# Patient Record
Sex: Female | Born: 1935 | Race: Black or African American | Hispanic: No | Smoking: Former smoker
Health system: Southern US, Community
[De-identification: ages and names within clinical notes are randomized; demographics above are authoritative.]

## PROBLEM LIST (undated history)

## (undated) DIAGNOSIS — J9600 Acute respiratory failure, unspecified whether with hypoxia or hypercapnia: Secondary | ICD-10-CM

## (undated) DIAGNOSIS — D3500 Benign neoplasm of unspecified adrenal gland: Secondary | ICD-10-CM

## (undated) DIAGNOSIS — I10 Essential (primary) hypertension: Secondary | ICD-10-CM

## (undated) DIAGNOSIS — C801 Malignant (primary) neoplasm, unspecified: Secondary | ICD-10-CM

## (undated) DIAGNOSIS — N289 Disorder of kidney and ureter, unspecified: Secondary | ICD-10-CM

## (undated) DIAGNOSIS — R7301 Impaired fasting glucose: Secondary | ICD-10-CM

## (undated) DIAGNOSIS — Z720 Tobacco use: Secondary | ICD-10-CM

## (undated) DIAGNOSIS — J449 Chronic obstructive pulmonary disease, unspecified: Secondary | ICD-10-CM

## (undated) DIAGNOSIS — M1990 Unspecified osteoarthritis, unspecified site: Secondary | ICD-10-CM

## (undated) DIAGNOSIS — J189 Pneumonia, unspecified organism: Secondary | ICD-10-CM

## (undated) DIAGNOSIS — J90 Pleural effusion, not elsewhere classified: Secondary | ICD-10-CM

## (undated) DIAGNOSIS — R9431 Abnormal electrocardiogram [ECG] [EKG]: Secondary | ICD-10-CM

## (undated) DIAGNOSIS — G4700 Insomnia, unspecified: Secondary | ICD-10-CM

## (undated) DIAGNOSIS — I2510 Atherosclerotic heart disease of native coronary artery without angina pectoris: Secondary | ICD-10-CM

## (undated) DIAGNOSIS — E785 Hyperlipidemia, unspecified: Secondary | ICD-10-CM

## (undated) DIAGNOSIS — K760 Fatty (change of) liver, not elsewhere classified: Secondary | ICD-10-CM

## (undated) DIAGNOSIS — K589 Irritable bowel syndrome without diarrhea: Secondary | ICD-10-CM

## (undated) DIAGNOSIS — K529 Noninfective gastroenteritis and colitis, unspecified: Secondary | ICD-10-CM

## (undated) DIAGNOSIS — C3492 Malignant neoplasm of unspecified part of left bronchus or lung: Secondary | ICD-10-CM

## (undated) DIAGNOSIS — K219 Gastro-esophageal reflux disease without esophagitis: Secondary | ICD-10-CM

## (undated) DIAGNOSIS — Z8249 Family history of ischemic heart disease and other diseases of the circulatory system: Secondary | ICD-10-CM

## (undated) DIAGNOSIS — I739 Peripheral vascular disease, unspecified: Secondary | ICD-10-CM

## (undated) DIAGNOSIS — E039 Hypothyroidism, unspecified: Secondary | ICD-10-CM

## (undated) HISTORY — DX: Hyperlipidemia, unspecified: E78.5

## (undated) HISTORY — DX: Disorder of kidney and ureter, unspecified: N28.9

## (undated) HISTORY — PX: THYROIDECTOMY, PARTIAL: SHX18

## (undated) HISTORY — PX: ABDOMINAL HYSTERECTOMY: SHX81

## (undated) HISTORY — DX: Peripheral vascular disease, unspecified: I73.9

## (undated) HISTORY — DX: Noninfective gastroenteritis and colitis, unspecified: K52.9

## (undated) HISTORY — DX: Fatty (change of) liver, not elsewhere classified: K76.0

## (undated) HISTORY — DX: Irritable bowel syndrome, unspecified: K58.9

## (undated) HISTORY — DX: Tobacco use: Z72.0

## (undated) HISTORY — DX: Malignant neoplasm of unspecified part of left bronchus or lung: C34.92

## (undated) HISTORY — DX: Hypothyroidism, unspecified: E03.9

## (undated) HISTORY — DX: Family history of ischemic heart disease and other diseases of the circulatory system: Z82.49

## (undated) HISTORY — DX: Abnormal electrocardiogram (ECG) (EKG): R94.31

## (undated) HISTORY — DX: Benign neoplasm of unspecified adrenal gland: D35.00

## (undated) HISTORY — DX: Unspecified osteoarthritis, unspecified site: M19.90

## (undated) HISTORY — DX: Impaired fasting glucose: R73.01

## (undated) HISTORY — DX: Insomnia, unspecified: G47.00

## (undated) HISTORY — DX: Atherosclerotic heart disease of native coronary artery without angina pectoris: I25.10

## (undated) HISTORY — DX: Gastro-esophageal reflux disease without esophagitis: K21.9

---

## 2000-10-12 ENCOUNTER — Ambulatory Visit (HOSPITAL_COMMUNITY): Admission: RE | Admit: 2000-10-12 | Discharge: 2000-10-12 | Payer: Self-pay | Admitting: *Deleted

## 2000-10-12 ENCOUNTER — Encounter: Payer: Self-pay | Admitting: Otolaryngology

## 2000-11-15 ENCOUNTER — Encounter (INDEPENDENT_AMBULATORY_CARE_PROVIDER_SITE_OTHER): Payer: Self-pay | Admitting: Specialist

## 2000-11-15 ENCOUNTER — Ambulatory Visit (HOSPITAL_BASED_OUTPATIENT_CLINIC_OR_DEPARTMENT_OTHER): Admission: RE | Admit: 2000-11-15 | Discharge: 2000-11-15 | Payer: Self-pay | Admitting: Otolaryngology

## 2002-05-15 ENCOUNTER — Ambulatory Visit (HOSPITAL_COMMUNITY): Admission: RE | Admit: 2002-05-15 | Discharge: 2002-05-15 | Payer: Self-pay | Admitting: Family Medicine

## 2002-05-15 ENCOUNTER — Encounter: Payer: Self-pay | Admitting: Family Medicine

## 2002-10-17 ENCOUNTER — Ambulatory Visit (HOSPITAL_COMMUNITY): Admission: RE | Admit: 2002-10-17 | Discharge: 2002-10-17 | Payer: Self-pay | Admitting: Family Medicine

## 2002-10-17 ENCOUNTER — Emergency Department (HOSPITAL_COMMUNITY): Admission: EM | Admit: 2002-10-17 | Discharge: 2002-10-18 | Payer: Self-pay | Admitting: Internal Medicine

## 2002-10-17 ENCOUNTER — Encounter: Payer: Self-pay | Admitting: Family Medicine

## 2002-10-25 HISTORY — PX: COLONOSCOPY: SHX174

## 2002-11-05 ENCOUNTER — Ambulatory Visit (HOSPITAL_COMMUNITY): Admission: RE | Admit: 2002-11-05 | Discharge: 2002-11-05 | Payer: Self-pay | Admitting: General Surgery

## 2003-05-30 ENCOUNTER — Emergency Department (HOSPITAL_COMMUNITY): Admission: EM | Admit: 2003-05-30 | Discharge: 2003-05-31 | Payer: Self-pay | Admitting: Emergency Medicine

## 2003-06-27 HISTORY — PX: COLON RESECTION: SHX5231

## 2003-08-27 ENCOUNTER — Emergency Department (HOSPITAL_COMMUNITY): Admission: EM | Admit: 2003-08-27 | Discharge: 2003-08-27 | Payer: Self-pay | Admitting: Emergency Medicine

## 2003-09-03 ENCOUNTER — Ambulatory Visit (HOSPITAL_COMMUNITY): Admission: RE | Admit: 2003-09-03 | Discharge: 2003-09-03 | Payer: Self-pay | Admitting: Family Medicine

## 2003-09-30 ENCOUNTER — Inpatient Hospital Stay (HOSPITAL_COMMUNITY): Admission: AD | Admit: 2003-09-30 | Discharge: 2003-10-06 | Payer: Self-pay | Admitting: Family Medicine

## 2003-11-03 ENCOUNTER — Ambulatory Visit (HOSPITAL_COMMUNITY): Admission: RE | Admit: 2003-11-03 | Discharge: 2003-11-03 | Payer: Self-pay | Admitting: Internal Medicine

## 2003-11-04 ENCOUNTER — Ambulatory Visit (HOSPITAL_COMMUNITY): Admission: RE | Admit: 2003-11-04 | Discharge: 2003-11-04 | Payer: Self-pay | Admitting: Urology

## 2003-11-10 ENCOUNTER — Inpatient Hospital Stay (HOSPITAL_COMMUNITY): Admission: AD | Admit: 2003-11-10 | Discharge: 2003-11-17 | Payer: Self-pay | Admitting: General Surgery

## 2003-11-18 ENCOUNTER — Inpatient Hospital Stay (HOSPITAL_COMMUNITY): Admission: EM | Admit: 2003-11-18 | Discharge: 2003-11-27 | Payer: Self-pay | Admitting: Emergency Medicine

## 2004-05-18 ENCOUNTER — Ambulatory Visit (HOSPITAL_BASED_OUTPATIENT_CLINIC_OR_DEPARTMENT_OTHER): Admission: RE | Admit: 2004-05-18 | Discharge: 2004-05-18 | Payer: Self-pay | Admitting: Otolaryngology

## 2004-05-18 ENCOUNTER — Ambulatory Visit (HOSPITAL_COMMUNITY): Admission: RE | Admit: 2004-05-18 | Discharge: 2004-05-18 | Payer: Self-pay | Admitting: Otolaryngology

## 2004-05-18 ENCOUNTER — Encounter (INDEPENDENT_AMBULATORY_CARE_PROVIDER_SITE_OTHER): Payer: Self-pay | Admitting: Specialist

## 2004-09-29 ENCOUNTER — Ambulatory Visit (HOSPITAL_COMMUNITY): Admission: RE | Admit: 2004-09-29 | Discharge: 2004-09-29 | Payer: Self-pay | Admitting: Family Medicine

## 2005-09-15 ENCOUNTER — Emergency Department (HOSPITAL_COMMUNITY): Admission: EM | Admit: 2005-09-15 | Discharge: 2005-09-15 | Payer: Self-pay | Admitting: Emergency Medicine

## 2005-10-16 ENCOUNTER — Ambulatory Visit (HOSPITAL_COMMUNITY): Admission: RE | Admit: 2005-10-16 | Discharge: 2005-10-16 | Payer: Self-pay | Admitting: Family Medicine

## 2006-02-15 ENCOUNTER — Ambulatory Visit: Payer: Self-pay | Admitting: Internal Medicine

## 2006-02-16 ENCOUNTER — Ambulatory Visit (HOSPITAL_COMMUNITY): Admission: RE | Admit: 2006-02-16 | Discharge: 2006-02-16 | Payer: Self-pay | Admitting: Internal Medicine

## 2006-02-16 ENCOUNTER — Ambulatory Visit: Payer: Self-pay | Admitting: Internal Medicine

## 2006-02-19 ENCOUNTER — Ambulatory Visit (HOSPITAL_COMMUNITY): Admission: RE | Admit: 2006-02-19 | Discharge: 2006-02-19 | Payer: Self-pay | Admitting: Internal Medicine

## 2006-03-28 ENCOUNTER — Ambulatory Visit (HOSPITAL_COMMUNITY): Admission: RE | Admit: 2006-03-28 | Discharge: 2006-03-28 | Payer: Self-pay | Admitting: Family Medicine

## 2006-05-03 ENCOUNTER — Ambulatory Visit: Payer: Self-pay | Admitting: Internal Medicine

## 2006-05-10 ENCOUNTER — Ambulatory Visit: Payer: Self-pay | Admitting: Internal Medicine

## 2006-06-28 ENCOUNTER — Ambulatory Visit: Payer: Self-pay | Admitting: Internal Medicine

## 2006-07-27 HISTORY — PX: ABDOMINAL AORTIC ANEURYSM REPAIR: SUR1152

## 2006-08-19 ENCOUNTER — Emergency Department (HOSPITAL_COMMUNITY): Admission: EM | Admit: 2006-08-19 | Discharge: 2006-08-19 | Payer: Self-pay | Admitting: Emergency Medicine

## 2006-08-20 ENCOUNTER — Ambulatory Visit: Payer: Self-pay | Admitting: Internal Medicine

## 2006-08-20 ENCOUNTER — Inpatient Hospital Stay (HOSPITAL_COMMUNITY): Admission: AD | Admit: 2006-08-20 | Discharge: 2006-08-30 | Payer: Self-pay | Admitting: Family Medicine

## 2006-08-24 ENCOUNTER — Ambulatory Visit: Payer: Self-pay | Admitting: Vascular Surgery

## 2006-08-25 ENCOUNTER — Encounter: Payer: Self-pay | Admitting: Vascular Surgery

## 2006-09-06 ENCOUNTER — Ambulatory Visit: Payer: Self-pay | Admitting: Vascular Surgery

## 2006-09-12 ENCOUNTER — Ambulatory Visit (HOSPITAL_COMMUNITY): Admission: RE | Admit: 2006-09-12 | Discharge: 2006-09-12 | Payer: Self-pay | Admitting: Family Medicine

## 2006-09-18 ENCOUNTER — Ambulatory Visit: Payer: Self-pay | Admitting: Vascular Surgery

## 2006-10-17 ENCOUNTER — Ambulatory Visit: Payer: Self-pay | Admitting: Internal Medicine

## 2006-11-13 ENCOUNTER — Ambulatory Visit: Payer: Self-pay | Admitting: Vascular Surgery

## 2007-08-02 ENCOUNTER — Observation Stay (HOSPITAL_COMMUNITY): Admission: AD | Admit: 2007-08-02 | Discharge: 2007-08-04 | Payer: Self-pay | Admitting: Family Medicine

## 2007-09-12 ENCOUNTER — Ambulatory Visit (HOSPITAL_COMMUNITY): Admission: RE | Admit: 2007-09-12 | Discharge: 2007-09-12 | Payer: Self-pay | Admitting: Family Medicine

## 2008-03-04 ENCOUNTER — Ambulatory Visit (HOSPITAL_COMMUNITY): Admission: RE | Admit: 2008-03-04 | Discharge: 2008-03-04 | Payer: Self-pay | Admitting: Family Medicine

## 2008-06-26 HISTORY — PX: CATARACT EXTRACTION: SUR2

## 2008-10-14 ENCOUNTER — Ambulatory Visit (HOSPITAL_COMMUNITY): Admission: RE | Admit: 2008-10-14 | Discharge: 2008-10-14 | Payer: Self-pay | Admitting: Family Medicine

## 2009-03-03 ENCOUNTER — Ambulatory Visit (HOSPITAL_COMMUNITY): Admission: RE | Admit: 2009-03-03 | Discharge: 2009-03-03 | Payer: Self-pay | Admitting: Family Medicine

## 2009-04-20 ENCOUNTER — Ambulatory Visit (HOSPITAL_COMMUNITY): Admission: RE | Admit: 2009-04-20 | Discharge: 2009-04-20 | Payer: Self-pay | Admitting: Ophthalmology

## 2009-05-04 ENCOUNTER — Ambulatory Visit (HOSPITAL_COMMUNITY): Admission: RE | Admit: 2009-05-04 | Discharge: 2009-05-04 | Payer: Self-pay | Admitting: Ophthalmology

## 2009-11-22 ENCOUNTER — Emergency Department (HOSPITAL_COMMUNITY): Admission: EM | Admit: 2009-11-22 | Discharge: 2009-11-23 | Payer: Self-pay | Admitting: Emergency Medicine

## 2010-02-24 HISTORY — PX: TRANSTHORACIC ECHOCARDIOGRAM: SHX275

## 2010-02-24 HISTORY — PX: CARDIAC CATHETERIZATION: SHX172

## 2010-03-20 ENCOUNTER — Inpatient Hospital Stay (HOSPITAL_COMMUNITY)
Admission: EM | Admit: 2010-03-20 | Discharge: 2010-03-26 | Payer: Self-pay | Source: Home / Self Care | Admitting: Emergency Medicine

## 2010-03-20 ENCOUNTER — Ambulatory Visit: Payer: Self-pay | Admitting: Cardiology

## 2010-03-21 ENCOUNTER — Encounter (INDEPENDENT_AMBULATORY_CARE_PROVIDER_SITE_OTHER): Payer: Self-pay | Admitting: Nephrology

## 2010-03-24 ENCOUNTER — Other Ambulatory Visit: Payer: Self-pay | Admitting: Cardiovascular Disease

## 2010-03-24 ENCOUNTER — Other Ambulatory Visit: Payer: Self-pay | Admitting: Internal Medicine

## 2010-03-25 ENCOUNTER — Other Ambulatory Visit: Payer: Self-pay | Admitting: Internal Medicine

## 2010-03-26 ENCOUNTER — Other Ambulatory Visit: Payer: Self-pay | Admitting: Internal Medicine

## 2010-04-04 ENCOUNTER — Ambulatory Visit (HOSPITAL_COMMUNITY): Admission: RE | Admit: 2010-04-04 | Discharge: 2010-04-04 | Payer: Self-pay | Admitting: Family Medicine

## 2010-07-17 ENCOUNTER — Encounter: Payer: Self-pay | Admitting: Vascular Surgery

## 2010-09-07 LAB — MAGNESIUM: Magnesium: 1.6 mg/dL (ref 1.5–2.5)

## 2010-09-07 LAB — CBC
MCV: 96.7 fL (ref 78.0–100.0)
Platelets: 195 10*3/uL (ref 150–400)
RDW: 13.7 % (ref 11.5–15.5)
WBC: 5.6 10*3/uL (ref 4.0–10.5)

## 2010-09-07 LAB — BASIC METABOLIC PANEL
BUN: 10 mg/dL (ref 6–23)
Calcium: 9.4 mg/dL (ref 8.4–10.5)
Chloride: 108 mEq/L (ref 96–112)
Creatinine, Ser: 1.42 mg/dL — ABNORMAL HIGH (ref 0.4–1.2)
GFR calc Af Amer: 44 mL/min — ABNORMAL LOW (ref >60)

## 2010-09-08 LAB — COMPREHENSIVE METABOLIC PANEL
ALT: 19 U/L (ref 0–35)
AST: 27 U/L (ref 0–37)
Albumin: 3.7 g/dL (ref 3.5–5.2)
Alkaline Phosphatase: 89 U/L (ref 39–117)
Alkaline Phosphatase: 96 U/L (ref 39–117)
BUN: 10 mg/dL (ref 6–23)
BUN: 9 mg/dL (ref 6–23)
CO2: 23 mEq/L (ref 19–32)
Calcium: 8.8 mg/dL (ref 8.4–10.5)
Calcium: 9.2 mg/dL (ref 8.4–10.5)
Chloride: 107 mEq/L (ref 96–112)
Creatinine, Ser: 1.49 mg/dL — ABNORMAL HIGH (ref 0.4–1.2)
GFR calc Af Amer: 42 mL/min — ABNORMAL LOW (ref >60)
GFR calc non Af Amer: 34 mL/min — ABNORMAL LOW (ref >60)
Glucose, Bld: 115 mg/dL — ABNORMAL HIGH (ref 70–99)
Glucose, Bld: 157 mg/dL — ABNORMAL HIGH (ref 70–99)
Potassium: 3.7 mEq/L (ref 3.5–5.1)
Potassium: 4.2 mEq/L (ref 3.5–5.1)
Sodium: 138 mEq/L (ref 135–145)
Total Bilirubin: 0.3 mg/dL (ref 0.3–1.2)
Total Protein: 5.9 g/dL — ABNORMAL LOW (ref 6.0–8.3)
Total Protein: 6.7 g/dL (ref 6.0–8.3)

## 2010-09-08 LAB — BASIC METABOLIC PANEL
BUN: 8 mg/dL (ref 6–23)
CO2: 22 mEq/L (ref 19–32)
CO2: 23 mEq/L (ref 19–32)
Calcium: 8.9 mg/dL (ref 8.4–10.5)
Calcium: 9.2 mg/dL (ref 8.4–10.5)
Chloride: 107 mEq/L (ref 96–112)
Chloride: 110 mEq/L (ref 96–112)
Creatinine, Ser: 1.52 mg/dL — ABNORMAL HIGH (ref 0.4–1.2)
GFR calc Af Amer: 43 mL/min — ABNORMAL LOW (ref >60)
GFR calc non Af Amer: 35 mL/min — ABNORMAL LOW (ref >60)
GFR calc non Af Amer: 36 mL/min — ABNORMAL LOW (ref >60)
Glucose, Bld: 110 mg/dL — ABNORMAL HIGH (ref 70–99)
Glucose, Bld: 88 mg/dL (ref 70–99)
Glucose, Bld: 98 mg/dL (ref 70–99)
Potassium: 3.8 mEq/L (ref 3.5–5.1)
Potassium: 4.2 mEq/L (ref 3.5–5.1)
Sodium: 131 mEq/L — ABNORMAL LOW (ref 135–145)
Sodium: 140 mEq/L (ref 135–145)

## 2010-09-08 LAB — LIPID PANEL
Cholesterol: 125 mg/dL (ref 0–200)
HDL: 45 mg/dL (ref >39)
LDL Cholesterol: UNDETERMINED mg/dL (ref 0–99)
Total CHOL/HDL Ratio: 2.8 RATIO
Triglycerides: 558 mg/dL — ABNORMAL HIGH (ref <150)
VLDL: UNDETERMINED mg/dL (ref 0–40)

## 2010-09-08 LAB — CK TOTAL AND CKMB (NOT AT ARMC)
CK, MB: 6.8 ng/mL (ref 0.3–4.0)
Relative Index: 0.9 (ref 0.0–2.5)
Total CK: 561 U/L — ABNORMAL HIGH (ref 7–177)
Total CK: 611 U/L — ABNORMAL HIGH (ref 7–177)
Total CK: 637 U/L — ABNORMAL HIGH (ref 7–177)

## 2010-09-08 LAB — TROPONIN I: Troponin I: 0.01 ng/mL (ref 0.00–0.06)

## 2010-09-08 LAB — CBC
HCT: 33.8 % — ABNORMAL LOW (ref 36.0–46.0)
Hemoglobin: 11.5 g/dL — ABNORMAL LOW (ref 12.0–15.0)
Hemoglobin: 11.9 g/dL — ABNORMAL LOW (ref 12.0–15.0)
MCH: 32.9 pg (ref 26.0–34.0)
MCH: 33.8 pg (ref 26.0–34.0)
MCHC: 33.7 g/dL (ref 30.0–36.0)
MCHC: 33.7 g/dL (ref 30.0–36.0)
MCHC: 33.8 g/dL (ref 30.0–36.0)
MCHC: 34 g/dL (ref 30.0–36.0)
MCV: 99.6 fL (ref 78.0–100.0)
Platelets: 158 10*3/uL (ref 150–400)
Platelets: 191 10*3/uL (ref 150–400)
RBC: 3.4 MIL/uL — ABNORMAL LOW (ref 3.87–5.11)
RDW: 13.6 % (ref 11.5–15.5)
RDW: 13.6 % (ref 11.5–15.5)
RDW: 14.7 % (ref 11.5–15.5)
RDW: 14.7 % (ref 11.5–15.5)
WBC: 4.8 10*3/uL (ref 4.0–10.5)
WBC: 4.8 10*3/uL (ref 4.0–10.5)

## 2010-09-08 LAB — CARDIAC PANEL(CRET KIN+CKTOT+MB+TROPI)
CK, MB: 5.8 ng/mL — ABNORMAL HIGH (ref 0.3–4.0)
CK, MB: 5.9 ng/mL — ABNORMAL HIGH (ref 0.3–4.0)
Relative Index: 1.1 (ref 0.0–2.5)
Total CK: 558 U/L — ABNORMAL HIGH (ref 7–177)
Troponin I: 0.01 ng/mL (ref 0.00–0.06)
Troponin I: 0.02 ng/mL (ref 0.00–0.06)

## 2010-09-08 LAB — CK: Total CK: 542 U/L — ABNORMAL HIGH (ref 7–177)

## 2010-09-08 LAB — URINALYSIS, ROUTINE W REFLEX MICROSCOPIC
Ketones, ur: NEGATIVE mg/dL
Leukocytes, UA: NEGATIVE
Nitrite: NEGATIVE
Protein, ur: NEGATIVE mg/dL
Urobilinogen, UA: 0.2 mg/dL (ref 0.0–1.0)
pH: 6 (ref 5.0–8.0)

## 2010-09-08 LAB — MAGNESIUM
Magnesium: 1.5 mg/dL (ref 1.5–2.5)
Magnesium: 1.6 mg/dL (ref 1.5–2.5)
Magnesium: 1.8 mg/dL (ref 1.5–2.5)
Magnesium: 2.1 mg/dL (ref 1.5–2.5)

## 2010-09-08 LAB — DIFFERENTIAL
Basophils Absolute: 0 10*3/uL (ref 0.0–0.1)
Basophils Relative: 1 % (ref 0–1)
Eosinophils Absolute: 0.1 10*3/uL (ref 0.0–0.7)
Eosinophils Relative: 2 % (ref 0–5)
Lymphocytes Relative: 55 % — ABNORMAL HIGH (ref 12–46)
Lymphs Abs: 2.7 10*3/uL (ref 0.7–4.0)
Monocytes Absolute: 0.3 10*3/uL (ref 0.1–1.0)
Monocytes Relative: 7 % (ref 3–12)
Neutro Abs: 1.7 10*3/uL (ref 1.7–7.7)
Neutrophils Relative %: 35 % — ABNORMAL LOW (ref 43–77)

## 2010-09-08 LAB — HEMOGLOBIN A1C
Hgb A1c MFr Bld: 6.9 % — ABNORMAL HIGH (ref <5.7)
Mean Plasma Glucose: 151 mg/dL — ABNORMAL HIGH (ref <117)

## 2010-09-08 LAB — BRAIN NATRIURETIC PEPTIDE: Pro B Natriuretic peptide (BNP): <30 pg/mL (ref 0.0–100.0)

## 2010-09-08 LAB — TSH: TSH: 5.916 u[IU]/mL — ABNORMAL HIGH (ref 0.350–4.500)

## 2010-09-08 LAB — URINE CULTURE
Colony Count: 9000
Culture  Setup Time: 201109252256

## 2010-09-08 LAB — URINE MICROSCOPIC-ADD ON

## 2010-09-08 LAB — MRSA PCR SCREENING: MRSA by PCR: NEGATIVE

## 2010-09-12 LAB — POCT CARDIAC MARKERS
CKMB, poc: 3.5 ng/mL (ref 1.0–8.0)
Myoglobin, poc: 244 ng/mL (ref 12–200)
Troponin i, poc: <0.05 ng/mL (ref 0.00–0.09)
Troponin i, poc: <0.05 ng/mL (ref 0.00–0.09)

## 2010-09-12 LAB — URINALYSIS, ROUTINE W REFLEX MICROSCOPIC
Bilirubin Urine: NEGATIVE
Glucose, UA: NEGATIVE mg/dL
Protein, ur: NEGATIVE mg/dL
Specific Gravity, Urine: <1.005 — ABNORMAL LOW (ref 1.005–1.030)
Urobilinogen, UA: 0.2 mg/dL (ref 0.0–1.0)

## 2010-09-12 LAB — BASIC METABOLIC PANEL
BUN: 9 mg/dL (ref 6–23)
Chloride: 105 mEq/L (ref 96–112)
GFR calc non Af Amer: 35 mL/min — ABNORMAL LOW (ref >60)
Potassium: 3.7 mEq/L (ref 3.5–5.1)
Sodium: 136 mEq/L (ref 135–145)

## 2010-09-12 LAB — URINE MICROSCOPIC-ADD ON

## 2010-09-12 LAB — DIFFERENTIAL
Eosinophils Absolute: 0.1 10*3/uL (ref 0.0–0.7)
Eosinophils Relative: 1 % (ref 0–5)
Lymphocytes Relative: 53 % — ABNORMAL HIGH (ref 12–46)
Lymphs Abs: 3.9 10*3/uL (ref 0.7–4.0)
Monocytes Absolute: 0.5 10*3/uL (ref 0.1–1.0)
Monocytes Relative: 6 % (ref 3–12)

## 2010-09-12 LAB — CBC
HCT: 38.5 % (ref 36.0–46.0)
Hemoglobin: 12.7 g/dL (ref 12.0–15.0)
MCV: 98.5 fL (ref 78.0–100.0)
Platelets: 190 10*3/uL (ref 150–400)
WBC: 7.2 10*3/uL (ref 4.0–10.5)

## 2010-09-13 ENCOUNTER — Other Ambulatory Visit (HOSPITAL_COMMUNITY): Payer: Self-pay | Admitting: Family Medicine

## 2010-09-13 DIAGNOSIS — Z139 Encounter for screening, unspecified: Secondary | ICD-10-CM

## 2010-09-15 ENCOUNTER — Ambulatory Visit (HOSPITAL_COMMUNITY)
Admission: RE | Admit: 2010-09-15 | Discharge: 2010-09-15 | Disposition: A | Discharge status: Home / Self Care | Payer: Medicare Other | Source: Ambulatory Visit | Attending: Family Medicine | Admitting: Family Medicine

## 2010-09-15 ENCOUNTER — Encounter (HOSPITAL_COMMUNITY): Payer: Self-pay

## 2010-09-15 DIAGNOSIS — M899 Disorder of bone, unspecified: Secondary | ICD-10-CM | POA: Insufficient documentation

## 2010-09-15 DIAGNOSIS — Z13820 Encounter for screening for osteoporosis: Secondary | ICD-10-CM | POA: Insufficient documentation

## 2010-09-15 DIAGNOSIS — M949 Disorder of cartilage, unspecified: Secondary | ICD-10-CM | POA: Insufficient documentation

## 2010-09-15 DIAGNOSIS — Z139 Encounter for screening, unspecified: Secondary | ICD-10-CM

## 2010-09-15 HISTORY — DX: Essential (primary) hypertension: I10

## 2010-09-29 LAB — HEMOGLOBIN AND HEMATOCRIT, BLOOD
HCT: 38.4 % (ref 36.0–46.0)
Hemoglobin: 13 g/dL (ref 12.0–15.0)

## 2010-09-29 LAB — BASIC METABOLIC PANEL
BUN: 15 mg/dL (ref 6–23)
Calcium: 9.5 mg/dL (ref 8.4–10.5)
Chloride: 106 mEq/L (ref 96–112)
Creatinine, Ser: 1.43 mg/dL — ABNORMAL HIGH (ref 0.4–1.2)
GFR calc Af Amer: 44 mL/min — ABNORMAL LOW (ref >60)
GFR calc non Af Amer: 36 mL/min — ABNORMAL LOW (ref >60)

## 2010-11-08 NOTE — H&P (Signed)
[name], [name]               ACCOUNT NO.:  [id number]   MEDICAL RECORD NO.:  [id number]          PATIENT TYPE:  OBV   LOCATION:  A319                          FACILITY:  [facility]   PHYSICIAN:  Scott A. Gerda Diss, MD    DATE OF BIRTH:  29-Sep-1934   DATE OF ADMISSION:  08/02/2007  DATE OF DISCHARGE:  LH                              HISTORY & PHYSICAL   The patient states over the past 48 hours she has had nausea and  abdominal cramping and just finds herself feeling exceptionally bad.  She denies high fever.  She just states more she just feels really run  down.  In addition to this, she also relates multiple episodes of  vomiting last night, through the night, and this morning, and she also  states she now has muscle cramps and discomfort.  She also finds herself  feeling that she is having a fair amount of diarrhea as well.  A little  bit of coughing, low-grade fevers, headache, and discomfort.  She has  tried clear liquids multiple different times without success, and they  all keep coming up.   PAST MEDICAL HISTORY:  1. Hypertension.  2. Hypothyroidism.  3. Hyperlipidemia.  4. Insomnia.  5. Adrenal adenoma.  6. Abdominal aneurysm and has had surgery on that.  7. Reflux.   She had a normal colonoscopy back in 2004.   FAMILY HISTORY:  Hypertension, diabetes, hyperlipidemia, cholesterol.   ALLERGIES:  None, but she states Phenergan does not help, and DILAUDID  she is unable to tolerate.   SOCIAL HISTORY:  She does smoke.  She is divorced.  She does not drink  alcohol.   MEDICATIONS:  1. Verapamil 240 mg ER 1/2 daily.  2. Xanax 0.5 mg b.i.d.  3. Taxol 30 mg daily.  4. Potassium 20 mEq daily.  5. Allopurinol 300 mg daily.  6. Lipitor 10 mg daily.  7. Ambien 10 mg nightly.  8. Synthroid 100 mcg daily.  9. Lomanate 3 times a day p.r.n.  10.Nexium 40 mg daily.  11.Vitamin 1 daily.   REVIEW OF SYSTEMS:  See above.  Denies chest pressure, tightness,  shortness of breath,  swelling of the legs.   PHYSICAL EXAMINATION:  GENERAL:  Looks to feel ill.  VITAL SIGNS:  Temperature 99. Weight 178.4; her normal weight is right  around 180.  Blood pressure lying 140/70, heart rate 110; sitting  120/68, heart rate 120; standing 106/62,  heart rate 130.  LUNGS:  Clear to auscultation.  HEART:  Regular, although baseline heart rate is about 110, but it is  regular.  No murmurs or gallops.  ABDOMEN:  Soft with suggested discomfort throughout the abdomen.  EXTREMITIES:  No edema.  SKIN:  Warm and dry.  NEUROLOGIC:  Grossly normal.   LABORATORY DATA:  Sodium 134, potassium 3.9, BUN 17, creatinine 1.78.  CBC: White cells 6.5, hemoglobin 13.4, left shift noted.  Liver enzymes:  AST 49, ALT 46.  It should be noted that in the past liver enzymes have  been normal.  It should also be noted that last creatinine I  have on the  patient was in March 2008 and was 1.16.  It is also noted that the liver  enzymes in July 2008 were normal.   It also should be noted that the patient had a CT scan done back in  March 2008, and I do not think she has had any since then.   ASSESSMENT:  Gastroenteritis with mild dehydration now.  I think the  patient would benefit from IV fluids and benefit from pain medication as  well as Zofran for nausea.  Gradually increase oral intake as tolerated.  I do not feel she has a surgical abdomen or sign of abscess.  Also, too,  I feel that this should come around over the course of the next few  days.  Hopefully on Saturday, February 7, she will be improved to the  point where she can be sent home to finish her recovery there.      Scott A. Gerda Diss, MD  Electronically Signed     SAL/MEDQ  D:  08/02/2007  T:  08/03/2007  Job:  [id number]

## 2010-11-08 NOTE — Discharge Summary (Signed)
NAME:  [name], [name]                  ACCOUNT NO.:  iet   MEDICAL RECORD NO.:  [id number]          PATIENT TYPE:  OBV   LOCATION:  A319                          FACILITY:  [facility]   PHYSICIAN:  Dorris Singh, DO    DATE OF BIRTH:  Oct 25, 1934   DATE OF ADMISSION:  08/02/2007  DATE OF DISCHARGE:  02/08/2009LH                               DISCHARGE SUMMARY   ADMISSION DIAGNOSES:  1. Gastroenteritis.  2. Dehydration.   DISCHARGE DIAGNOSES:  1. Gastroenteritis, which has resolved.  2. Dehydration, which has resolved.  3. Hypertension.  4. Hyperlipidemia.  5. Insomnia.  6. Gastroesophageal reflux disease.  7. Adrenal adenoma.  8. Abdominal aneurysm with repair.   PRIMARY CARE PHYSICIAN:  Lilyan Punt, M.D.   HISTORY AND PHYSICAL:  Please refer to that done by Dr. Lilyan Punt.   HOSPITAL COURSE:  The patient was admitted to the service of Dr. Gerda Diss.  She was started on intravenous fluids and put on Zofran. She continued  to increase her oral hydration as tolerated. At that point in time, the  [facility] covered for Dr. Gerda Diss on Saturday, August 03, 2007.  The patient was seen adn stated that she felt a little bit better. Still  had no tried to eat but would increase her diet over the day. Today, she  had improved, felt like she could go home. Will go ahead and then  discharge her currently as follows.   CURRENT MEDICATIONS:  1. Verapamil,  no dose given, daily.  2. Lipitor, no dose given, daily.  3. Xanax 0.5 mg twice daily.  4. Paxil 30 mg every day p.o.  5. Allopurinol, no dose given.  6. Multivitamin, no dose given.  7. Synthroid 100 mcg daily.  8. Nexium 40 mg daily.  9. Ambien 10 mg at bedtime.  10.She will be sent home on Phenergan 12.5 mg 1 p.o. t.i.d. p.r.n.      nausea.   ACTIVITY:  Increase slowly.   DIET:  Utilize a brat diet. Encourage fluids.   FOLLOWUP:  With Dr. Gerda Diss next week.   SPECIAL INSTRUCTIONS:  Stop smoking. Resume all  medications.      Dorris Singh, DO  Electronically Signed     CB/MEDQ  D:  08/04/2007  T:  08/04/2007  Job:  [id number]   cc:   Lorin Picket A. Gerda Diss, MD  Fax: [phone]

## 2010-11-08 NOTE — Group Therapy Note (Signed)
[name], [name]               ACCOUNT NO.:  [id number]   MEDICAL RECORD NO.:  [id number]          PATIENT TYPE:  OBV   LOCATION:  A319                          FACILITY:  [facility]   PHYSICIAN:  Dorris Singh, DO    DATE OF BIRTH:  Jan 10, 1935   DATE OF PROCEDURE:  DATE OF DISCHARGE:                                 PROGRESS NOTE   The patient is a patient of Dr. Gerda Diss.  We are covering for him this  weekend, Dr. Lilyan Punt.  She seems to be doing well.  Has not had any  episodes of nausea or vomiting since admission.  I explained to her that  we will continue to hydrate her today and plan on, as long as she is  advancing her diet without any difficulty will plan on discharge first  thing tomorrow morning.  The patient understands plan.  Also discussed  with her her smoking and the need for cessation as well.  The patient is  scheduled for a chest x-ray today and will continue to monitor that and  as long as she is continuing to improve will continue with plan as  stated before.   VITAL SIGNS:  Her vitals are as follows.  Temperature is 99.2, pulse 86,  respirations 20, blood pressure 126/71.  GENERAL:  The patient is a 76 year old African American female who is  well-developed, well-nourished and in no acute distress.  HEART:  Regular rate and rhythm.  LUNGS:  Clear to auscultation bilaterally.  ABDOMEN:  Soft, nontender, nondistended.  EXTREMITIES:  Positive pulses.  No ecchymosis seen.  No cyanosis noted.   Her labs for today include a UA which is negative.  She has trace blood  and protein is trace and on yesterday's lab she was hyponatremic.  We  will go ahead and repeat labs for tomorrow.   ASSESSMENT AND PLAN:  1. Gastroenteritis (viral) and dehydration.  Will continue her on IV      fluids with antiemetics.  Will place her on her home medications.  2. Also, as long as she continues to progress will plan on discharging      on 1 day.      Dorris Singh, DO  Electronically Signed     CB/MEDQ  D:  08/03/2007  T:  08/03/2007  Job:  [id number]

## 2010-11-11 NOTE — Consult Note (Signed)
NAMEBILLIEJEAN, [name]                           ACCOUNT NO.:  [id number]   MEDICAL RECORD NO.:  [id number]                   PATIENT TYPE:  INP   LOCATION:  IC03                                 FACILITY:  [facility]   PHYSICIAN:  Dalia Heading, M.D.               DATE OF BIRTH:  [birth year]   DATE OF CONSULTATION:  09/30/2003  DATE OF DISCHARGE:                                   CONSULTATION   SURGERY CONSULTATION   REASON FOR CONSULTATION:  Acute abdomen.   HISTORY OF PRESENT ILLNESS:  The patient is a 76 year old black female who  was in her usual state of health until earlier today when she began  experiencing diffuse abdominal pain.  She also experienced some mild nausea  and vomiting.  She presented to Dr. Enzo Bi Luking's office and was sent  over the [facility] for further evaluation and treatment.  She was  noted to have an increased leukocytosis with increased bands.  She also has  a large bloody stool.  Dr. Jena Gauss of gastroenterology was consulted and  ordered a CT scan of the abdomen and pelvis.  He also immediately called for  a general surgery consultation.  The patient had seen Dr. Katrinka Blazing in the past,  but he is not available at this time.   The patient has had intermittent abdominal pain episodes in the past.  She  had a previous CT scan of the abdomen and pelvis which showed some stranding  of the perigastric fat.  She has also had a colonoscopy and apparent EGD by  Dr. Katrinka Blazing in the past, though those results are not available at this time.   PAST MEDICAL HISTORY:  See history and physical examination.   PAST SURGICAL HISTORY:  Hysterectomy, appendectomy, EGD and colonoscopy.   CURRENT MEDICATIONS:  Ciprofloxacin, Flagyl, baby aspirin, verapamil,  hydrochlorothiazide, as well as Synthroid at home.   ALLERGIES:  No known drug allergies.   PHYSICAL EXAMINATION:  GENERAL:  On physical examination, the patient is a  well-developed, well-nourished, ventricular  fibrillation who is in moderate  distress.  This appears to be more anxiety related.  VITAL SIGNS:  Vital signs when seen in the intensive care unit revealed a  pulse of 100, respirations 36, blood pressure 134/57.  Her temperature was  98.4.  ABDOMEN:  Her abdomen is soft with nonspecific guarding.  This seems to be  migratory in nature. She does not have rigidity.  Occasional bowel sounds  are heard.  No hepatosplenomegaly or masses are noted.  No hernias are  noted.  A lower midline surgical scar is noted.  RECTAL:  Examination was deferred as the patient was on her way to CAT scan.   White blood cell count 22.4, hematocrit 30.4, platelet count 414.  MET-7 is  remarkable for a creatinine of 2.2, BUN 35, SGOT 64, SGPT 64, alkaline  phosphatase 128, total bilirubin  0.7.  Her albumin is 2.6.   CT scan of the abdomen and pelvis without oral or IV contrast reveals  retroperitoneal air along the left psoas muscle and aorta extending up to  the left kidney and the lower tip of the spleen. This appears to be only  retroperitoneal in nature.  She does have thickening of the sigmoid colon  wall which is minimal in nature.  She also has diverticulosis present.  No  significant free fluid is noted.  Those seem to be the pertinent findings.   IMPRESSION:  Retroperitoneal perforation of sigmoid  diverticulosis/diverticulitis.   PLAN:  At this point acute surgical intervention is not warranted unless the  patient's condition worsens. She will need a partial colectomy in the  future, once this episode has quieted down.  We will keep her NPO and  control her pain. Zosyn and Flagyl have both been ordered.  A follow up CAT  scan in approximately 3-4 days is indicated to assess the extent of  inflammation and to rule out formation of an abscess.  This has been  explained to the patient.  Dr. Luvenia Starch and Donna Bernard have also been  notified as well.   Thank you for consulting me on [name].  I will follow her closely  with you.      ___________________________________________                                            Dalia Heading, M.D.   MAJ/MEDQ  D:  09/30/2003  T:  10/01/2003  Job:  [id number]   cc:   R. Roetta Sessions, M.D.  [address]  [place]  [place] [address]  Fax: [phone]   W. Simone Curia, M.D.  [address]  [place]  [place] [address]  Fax: [phone]   Dirk Dress. Katrinka Blazing, M.D.  [address]  [place]  [place] [address]  Fax: [phone]

## 2010-11-11 NOTE — Op Note (Signed)
[name], [name]                           ACCOUNT NO.:  [id number]   MEDICAL RECORD NO.:  [id number]                  PATIENT TYPE:   LOCATION:                                       FACILITY:   PHYSICIAN:  Dalia Heading, M.D.               DATE OF BIRTH:   DATE OF PROCEDURE:  11/11/2003  DATE OF DISCHARGE:                                 OPERATIVE REPORT   PREOPERATIVE DIAGNOSIS:  History of sigmoid colon perforation.   POSTOPERATIVE DIAGNOSIS:  History of sigmoid colon perforation,  atherosclerotic mesentery disease.   PROCEDURE:  Near total colectomy with ileocolic anastomosis.   SURGEON:  Dalia Heading, M.D.   ASSISTANT:  Buena Irish, M.D.   ANESTHESIA:  General endotracheal anesthesia.   INDICATIONS:  The patient is a 76 year old black female who was status post  a retroperitoneal perforation of the sigmoid colon approximately 6 weeks  ago.  She was recently found, on colonoscopy, by Dr. Jena Gauss to have mucosal  ulcerations seen from the hepatic flexure down to the sigmoid colon  consistent with possible ischemia.  She does have a CT scan of the abdomen  and pelvis which reveals stenosis of the celiac trunk, occlusion of the  inferior mesenteric artery and normal superior mesenteric artery.  Due to  these findings, and the lack of diverticular disease on colonoscopy, the  patient not comes to the operating room for a subtotal colectomy.  The risks  and benefits of the procedure including bleeding, infection, cardiopulmonary  difficulties, and the possibility of blood transfusion were fully explained  to the patient, who gave informed consent   DESCRIPTION OF PROCEDURE:  The patient was placed in the supine position.  After induction of general anesthesia, the abdomen was prepped and draped  using the usual sterile technique with Betadine.  Surgical site confirmation  was performed.   A midline incision was made from just above the umbilicus to the  suprapubic  region.  The peritoneal cavity was entered into without difficulty.  The  nasogastric tube was noted to be in its appropriate position in the stomach.  The liver was inspected and noted to be within normal limits.  The small  bowel had multiple adhesions present down to the right true pelvis.  The  transverse colon had areas of thinning of the colon, consistent with  possible history of ischemia.  The left colon mesentery was noted to be  shortened.  The sigmoid colon and colorectal region were noted to be  inflamed.  A mesenteric cavity of purulent fluid was found.  This was  drained without difficulty.  The abscess cavity was confined to the base of  the sigmoid colon mesentery.  The sigmoid colon and descending colon were  mobilized along their peritoneal reflections.  This was likewise done to the  right colon.  The small bowel was freed away from the pelvis.  There were  significant amounts of adhesions of small bowel into this area.   A GIA stapler was placed across the small bowel proximal to the terminal  ileum.  A TA-60 stapler was placed across the distal sigmoid colon and  fired.  The mesentery of the colon and distal small bowel was then ligated  and divided.  The specimen was then removed from the operative field.  The  abdominal cavity was copiously irrigated with normal saline.  A side-to-side  ileocolonic anastomosis was then performed using a GIA stapler.  The  enterotomy was closed using a TA-60 reticulating stapler.  The staple line  was bolstered using 3-0 silk Lambert sutures.  No significant mesenteric  defect was present to close.   A #10, flat, Jackson-Pratt drain was placed into the pelvis over the  anastomosis and down the left true pelvis line.  Surgicel and Gelfoam were  also placed in this area up to the area of raw surface where the mesenteric  abscess had been present.  The mesentery that had been divided was inspected  and no abnormal bleeding  was noted.  All surgical personnel then changed  their gloves.   Of note, was the fact that the patient was status post a hysterectomy.  There was adhesive disease of the small bowel to the vaginal cuff.  Also a  right ovarian remnant was present.  The fascia was reapproximated using a  looped #0 Novofil running suture.  The subcutaneous layer was irrigated and  the skin was closed using staples.  Betadine ointment and dry sterile  dressing were applied.  The Jackson-Pratt drain was secured at skin level  using a 3-0 Nylon interrupted suture.   All tape and needle counts were correct at the end of the procedure.  The  patient was extubated in the operating room and went back to the recovery  room awake and stable condition.   COMPLICATIONS:  None.   SPECIMENS:  Colon and distal small bowel.   BLOOD LOSS:  850 cc.   DRAINS:  Jackson-Pratt drain in the pelvis.      ___________________________________________                                            Dalia Heading, M.D.   MAJ/MEDQ  D:  11/11/2003  T:  11/11/2003  Job:  [id number]   cc:   Dalia Heading, M.D.  [address], [place]  [place] [address]  Fax: [phone]   R. Roetta Sessions, M.D.  [address]  [place]  [place] [address]  Fax: [phone]   W. Simone Curia, M.D.  [address]  [place]  [place] [address]  Fax: [phone]

## 2010-11-11 NOTE — Op Note (Signed)
[place]  Patient:    [name], [name] [facility]                        MRN: [id number] Proc. Date: 11/15/00 Attending:  Margit Banda. Jearld Fenton, M.D.                           Operative Report  PREOPERATIVE DIAGNOSIS:  Left serous otitis media and left nasopharyngeal mass.  POSTOPERATIVE DIAGNOSIS:  Left serous otitis media and left nasopharyngeal mass.  OPERATION PERFORMED:  Left tympanostomy tube and nasopharyngeal examination with nasal endoscopy and biopsy.  SURGEON:  Margit Banda. Jearld Fenton, M.D.  ANESTHESIA:  General endotracheal.  ESTIMATED BLOOD LOSS:  Less than 5 cc.  INDICATIONS FOR PROCEDURE:  The patient is a 76 year old who has had a problem with left serous otitis media for several months.  She has been treated with medical therapy and has failed to resolve the middle ear effusion.  A nasopharyngeal examination was performed with nasal endoscopy in the office and she did have some fullness in the region in the midline and left direction of her nasopharynx.  A CT scan was performed, which did show also some fullness and mass effect in her left nasopharyngeal region.  She therefore was set up for a biopsy of this area as well as she wanted to proceed with a tympanostomy tube.  She was informed of the risks and benefits of the procedure including bleeding, infection, perforation, chronic drainage, hearing loss, bleeding from the nasopharynx and carotid injury, possible need for future treatment, and risks of the anesthetic.  All questions were answered and consent was obtained.  DESCRIPTION OF PROCEDURE:  The patient was taken to the operating room and placed in supine position.  After adequate general endotracheal tube anesthesia was placed in the right gaze position.  Cerumen was cleaned from the external auditory canal under otomicroscope direction.  Myringotomy made in the anterior inferior quadrant and serous effusion was suctioned from the middle  ear, Sheehy tube placed, Floxin drops were instilled.  The patients head was then turned to the supine straight position and oxymetazoline pledgets were placed into the nose.  The inferior turbinate was injected with 1% lidocaine with 1:100,000 epinephrine.  The turbinate was outfractured.  The nasal endoscopy was then used examine the nasopharynx and the mass that was visualized was injected with 1% lidocaine with 1:100,000 epinephrine.  The biopsies were then taken with the Blakesley Wild forceps.  It was taken from Rosenmullers fossa and the mass that was more lateral from this.  The mass did seem to be very  fleshy and midline that extended into the left area.  The eustachian tube orifice looked swollen and actually closed.  Multiple biopsies were taken of this fleshy tissue.  It did not look granular.  Bleeding was controlled with oxymetazoline pledgets as well as suction cautery.  Good hemostasis was present after this.  The patient was then awakened and brought to recovery in stable condition.  Counts correct. DD:  11/15/00 TD:  11/15/00 Job: [id number]

## 2010-11-11 NOTE — H&P (Signed)
[name], [name]               ACCOUNT NO.:  [id number]   MEDICAL RECORD NO.:  [id number]          PATIENT TYPE:  INP   LOCATION:  A302                          FACILITY:  [facility]   PHYSICIAN:  Donna Bernard, M.D.DATE OF BIRTH:  1935-01-02   DATE OF ADMISSION:  08/20/2006  DATE OF DISCHARGE:  LH                              HISTORY & PHYSICAL   CHIEF COMPLAINT:  Abdominal pain.   SUBJECTIVE:  This patient is a 76 year old black female with a history  of hypertension, hypothyroidism, hyperlipidemia, partial subtotal  colectomy remotely, adrenal adenoma and chronic abdominal aneurysm who  presented to the office on the day of admission with complaints of  epigastric pain.  The patient states the night before she was in the  emergency room with abdominal pain in the upper mid abdomen.  She  describes it as colicky in nature, coming and going, but was associated  with nausea.  The ER did blood work.  The only abnormality was an  amylase of 161.  The patient was advised she could potentially be having  biliary colic.  She was set up for an ultrasound at the hospital and  advised to come over to the office for follow-up.  The patient still  complained of mid epigastric discomfort, burning in nature.  The pain is  colicky.  It comes and goes.  There is some associated nausea.  The  patient has no prior history of pancreatitis.   CURRENT MEDICATIONS:  The patient claims compliance with her current  medicines which include:  1. Paxil 30 mg daily.  2. Potassium chloride 20 mEq daily.  3. Nexium 40 mg daily.  4. Multivitamin one daily.  5. Alprazolam 0.5 t.i.d. p.r.n.  6. Lipitor one half 10 mg q.h.s.  7. Verapamil 120 mg SR daily.  8. Allopurinol 300 mg daily.  9. Synthroid 0.1 mg daily.  10.Imodium p.r.n.  11.Restoril 30 q.h.s.  12.Vasotec 10 q.h.s.   PRIOR MEDICAL HISTORY:  Significant for chronic problems as noted above.  Of note, the patient's abdominal aneurysm was 4.7 cm  last fall on the  prior scan.  The past her surgeon recommended that she have scans every  6 months.   PRIOR SURGERIES:  1. Remote hysterectomy.  2. Remote thyroidectomy.   FAMILY HISTORY:  Positive for hypertension, diabetes, coronary artery  disease, hyperlipidemia.   SOCIAL HISTORY:  The patient is divorced.  Smokes a pack a day.  No  alcohol use.  Does have children.   REVIEW OF SYSTEMS:  Otherwise negative.   PHYSICAL EXAMINATION:  VITAL SIGNS:  Blood pressure 152/85 on repeat.  GENERAL:  Alert, some distress.  HEENT:  Normal.  NECK:  Supple.  LUNGS:  Clear.  ABDOMEN:  Moderate epigastric tenderness.  Bowel sounds present.  No CVA  tenderness.  HIPS:  Good range of motion.  EXTREMITIES:  Normal.   SIGNIFICANT LABORATORIES:  See chart.  Amylase elevated at 161.  Abdominal ultrasound shows stable size of aneurysm and no  cholelithiasis, normal bile duct.   IMPRESSION:  1. Probable pancreatitis.  2. History of abdominal  aneurysm.  3. Hypertension.  4. Hypothyroidism.  5. Hyperlipidemia.  6. History of subtotal colectomy.   PLAN:  Admit for IV fluids, IV pain control, IV nausea control.  Recheck  an amylase in the morning.  Will go ahead and do a CT scan of the  abdomen and pelvis in the morning.  Further orders as noted chart.      Donna Bernard, M.D.  Electronically Signed     WSL/MEDQ  D:  08/21/2006  T:  08/21/2006  Job:  [id number]

## 2010-11-11 NOTE — H&P (Signed)
[name], BROTHERS                           ACCOUNT NO.:  [id number]   MEDICAL RECORD NO.:  [id number]                   PATIENT TYPE:  INP   LOCATION:  IC03                                 FACILITY:  [facility]   PHYSICIAN:  Donna Bernard, M.D.             DATE OF BIRTH:  04-Oct-1934   DATE OF ADMISSION:  09/30/2003  DATE OF DISCHARGE:                                HISTORY & PHYSICAL   CHIEF COMPLAINT:  Abdominal pain and fever.   HISTORY OF PRESENT ILLNESS:  This patient is a 76 year old woman with a  history of hypertension, hypothyroidism, hyperlipidemia, intermittent  gastritis, and recent diagnosis of abdominal aneurysm and adrenal adenoma  who presents to the office the day of admission with complaints of abdominal  discomfort.  The patient states that she has had ongoing difficulties with  vomiting and diarrhea.  The patient also notes a sense of shortness of  breath when she takes a deep breath that seems to bother her abdomen.  The  patient has been having chills and achiness.  Her appetite has been  diminished.  She has noted some muscle achiness.  She was in fact seen in  the office five days ago with  two-day history of vomiting, diarrhea, low-  grade fever.  Her exam at that time showed a soft abdomen with no obvious  masses or tenderness.  She at that time was having chills and aching.  Her  temperature was 100.1.  The patient was given Phenergan as needed for the  vomiting and diet was discussed.   Of note, the patient saw Dr. Katrinka Blazing in consultation several weeks ago with  abdominal discomfort.  At that time, we did blood work.  The white blood  count was normal at 9.3.  Amylase was normal.  A scan had been obtained in  the emergency room and show adrenal gland mass along with an aortic  aneurysm.  This was felt to be not related to her abdominal discomfort.  The  patient was seen again in followup with ongoing abdominal discomfort.  It  should be noted she has had  abdominal pain off and on for many years.  I  sent her to Dr. Katrinka Blazing.  He evaluated her, looked at her records and advised  her felt her symptoms were primarily coming from inflammation in the muscles  and in the chest wall.  The patient's appetite has been fair up until  recently.  She also notes pyuria.  The patient has seen no blood in her  stools prior to admission.   Several hours after the patient presented to our office, she had a  significant large, bloody bowel movement on 3-A, soon after having been  examined by Dr. Jena Gauss.   MEDICATIONS:  The patient claims compliance with her current medications  which include:  1. Dyazide one daily.  2. Synthroid 0.1 mg daily.  3. Paxil CR  20 mg daily.  4. Verapamil 180 SR daily.  5. Klor-Con 20 mEq daily.  6. Ambien 10 mg q.h.s.  7. Lipitor 10 mg q.h.s.  8. Prevacid 30 mg daily.  9. Vicodin p.r.n.  10.      Baby aspirin, 81 mg daily.  11.      Phenergan p.r.n. for nausea.   PRIOR SURGERIES:  Significant for remote hysterectomy, partial  thyroidectomy.   FAMILY HISTORY:  Positive for hypertension, diabetes, coronary artery  disease, hyperlipidemia.   ALLERGIES:  None known.   SOCIAL HISTORY:  The patient is divorced.  She smokes one pack per day.  No  significant alcohol intake.   REVIEW OF SYSTEMS:  The patient notes occasional dyspnea the last several  days with exertion.  No orthopnea.  No chest pain.  No swelling of the  ankles.  MUSCULOSKELETAL:  The patient has had some achiness in the muscles  and the joints.  GENERAL:  The patient has been noticing some fatigue and  tiredness.   PHYSICAL EXAMINATION:  VITAL SIGNS:  Temperature 99.6, weight down at 179  pounds.  Blood pressure 110/60.  GENERAL:  The patient shows obvious malaise.  HEENT:  Nasal congestion.  NECK:  Supple, no JVD.  LUNGS:  Rare basilar crackle, no tachypnea.  HEART:  Regular rate and rhythm.  CHEST:  Wall nontender.  ABDOMEN:  Diffuse tenderness,  left upper quadrant, left lower quadrant.  Slightly in the right upper quadrant.  Bowel sounds are present but appear  diminished.  No CVA tenderness.  EXTREMITIES:  No edema.  No focal neurological deficits.   LABORATORY DATA:  Significant labs, __________22,000,  hemoglobin 10.5, 93%  neutrophils.  Sed rate 75.  MET-7:  Sodium 131, glucose 184, BUN 35,  creatinine 2.2.  Liver enzymes slightly elevated.  Albumin low at 2.6.   Chest x-ray:  No acute disease.   IMPRESSION:  1. Abdominal pain, progressed to significant elevation of white blood count,     fever, diffuse tenderness and now significant hematochezia.  This could     represent diverticulitis or similar, with secondary hemorrhage.  It could     also represent potential for bowel ischemia.  The severity of the     patient's situation has been discussed at length with the patient's     sister and her daughter.  I have talked over the case with Dr. Jena Gauss.     Dr. Jena Gauss has spoken with Dr. Lovell Sheehan.  We have gone ahead and transfused     her two units of packed red blood cells.  We are going to press on and do     a CT scan of the abdomen.  The patient can not utilized contrast due to     creatinine at 2, nor can she take oral due to ongoing nausea symptoms.     The family is aware that this could have a very serious outcome.  I have     advised them that this evening Dr. Jena Gauss and Dr. Lovell Sheehan and by     extension, Dr. Jonna Coup. Luking covering for myself will be managing her     care.  2. Hypertension.  3. Hypothyroidism.  4. Insomnia.   PLAN:  As noted above and per orders.     ___________________________________________  Donna Bernard, M.D.   Karie Chimera  D:  09/30/2003  T:  10/01/2003  Job:  [id number]

## 2010-11-11 NOTE — Op Note (Signed)
NAMEMARISAL, [name]               ACCOUNT NO.:  [id number]   MEDICAL RECORD NO.:  [id number]          PATIENT TYPE:  AMB   LOCATION:  DAY                           FACILITY:  [facility]   PHYSICIAN:  Lionel December, M.D.    DATE OF BIRTH:  03/07/1935   DATE OF PROCEDURE:  02/16/2006  DATE OF DISCHARGE:                                 OPERATIVE REPORT   PROCEDURE:  Esophagogastroduodenoscopy.   INDICATION:  Timarie is a 76 year old, African-American female with a 17-month  history of epigastric pain with nausea and sporadic vomiting, who has not  responded to therapy.  She is undergoing diagnostic EGD.  Procedure and  risks were reviewed with the patient.  Informed consent was obtained.  The  details of her history and physical are summarized in my note from February 15, 2006.   MEDICATIONS FOR CONSCIOUS SEDATION:  1. Benzocaine spray for pharyngeal topical anesthesia.  2. Demerol 50 mg IV.  3. Versed 6 mg IV.   FINDINGS:  Procedure performed in endoscopy suite.  The patient's vital  signs and O2 saturation were monitored during the procedure and remained  stable.   The patient was placed in the left lateral recumbent position, and the  Olympus video scope was passed via the oropharynx without any difficulty  into the esophagus.   Esophagus:  Mucosa of the esophagus normal.  GE junction was at 43 cm from  the incisors.   Stomach:  It was empty and distended very well with insufflation.  Folds of  the proximal stomach were normal.  Examination of the mucosa revealed patchy  erythema and granularity at antrum, but no erosions or ulcers were noted.  Pyloric channel was patent.  Angularis, fundus, and cardia were examined by  retroflexing the scope and were normal.   Duodenum:  Bulbar mucosa was normal.  Scope was passed to the second part of  the duodenum, where the mucosa and folds were normal.  Endoscope was  withdrawn.  The patient tolerated the procedures well.   FINAL DIAGNOSIS:   Nonerosive antral gastritis.  Otherwise normal  esophagogastroduodenoscopy.  This finding would not explain the patient's  symptoms.   PLAN:  1. She will have CBC, LFTs, and serum Helicobacter pylori serology.  2. Will schedule her for upper abdominal ultrasound as soon as possible.      Lionel December, M.D.  Electronically Signed     NR/MEDQ  D:  02/16/2006  T:  02/17/2006  Job:  [id number]   cc:   Donna Bernard, M.D.  Fax: [phone]

## 2010-11-11 NOTE — H&P (Signed)
[name], [name]                           ACCOUNT NO.:  [id number]   MEDICAL RECORD NO.:  [id number]                   PATIENT TYPE:  INP   LOCATION:  A331                                 FACILITY:  [facility]   PHYSICIAN:  Dalia Heading, M.D.               DATE OF BIRTH:  Jan 13, 1935   DATE OF ADMISSION:  11/18/2003  DATE OF DISCHARGE:                                HISTORY & PHYSICAL   CHIEF COMPLAINT:  Nausea and vomiting.   HISTORY OF PRESENT ILLNESS:  Patient is a 76 year old black female status  post subtotal colectomy one week ago who was discharged yesterday to home.  Over the next 24 hours, she developed increasing nausea and had one episode  of nausea.  The family brought the patient back to the emergency room for  further evaluation and treatment.  She described some mild incisional pain.  She states that her nausea has somewhat decreased, and she is taking  Phenergan at home.  No fevers or chills have been noted.   PAST MEDICAL HISTORY:  As noted above.  A history of sigmoid diverticular  perforation, hypertension, hypothyroidism, anxiety disorder.   PAST SURGICAL HISTORY:  As noted above.  Hysterectomy, appendectomy, EGD,  and colonoscopy in the past.   MEDICATIONS:  Include Synthroid, verapamil, Darvocet, Phenergan.   ALLERGIES:  No known drug allergies.   REVIEW OF SYSTEMS:  Patient denies any chest pain, angina, CVA, diabetes  mellitus.  Patient denies any bleeding disorders.   PHYSICAL EXAMINATION:  VITAL SIGNS:  T max 99.  Vital signs are stable.  GENERAL:  Patient is a pleasant black female who is mildly anxious but in no  acute distress.  HEENT:  No scleral icterus.  LUNGS:  Clear to auscultation with equal breath sounds bilaterally.  HEART:  Regular rate and rhythm without S3 or S4 or murmurs.  ABDOMEN:  Soft and only slightly distended.  Minimal bowel sounds are heard.  A lower midline incision is healing well without purulent drainage.  No  rigidity is  noted.   Liver profile is remarkable for an SGOT of 182, SGPT 301, albumin of 2.7.  Lipase is normal.  MET-7 is unremarkable.  White blood cell count is  elevated at 15.9, hematocrit 35, platelet count 525 with 89 segs and 6  lymphs.   Abdominal series reveals a nonspecific ileus pattern with several air/fluid  levels present in the small bowel.   IMPRESSION:  Postoperative ileus with elevated liver enzyme tests of unknown  etiology.   PLAN:  Patient will be admitted to the hospital for further workup.  This  will probably include a CT scan of the abdomen and pelvis.  Intravenous  hydration and control of her nausea will be done.     ___________________________________________  Dalia Heading, M.D.   MAJ/MEDQ  D:  11/19/2003  T:  11/19/2003  Job:  [id number]   cc:   Donna Bernard, M.D.  [address]  [place]  [place] [address]  Fax: [phone]

## 2010-11-11 NOTE — Consult Note (Signed)
[name], [name]               ACCOUNT NO.:  [id number]   MEDICAL RECORD NO.:  [id number]          PATIENT TYPE:  INP   LOCATION:  2302                         FACILITY:  [facility]   PHYSICIAN:  Gabrielle Dare. Janee Morn, M.D.DATE OF BIRTH:  May 07, 1935   DATE OF CONSULTATION:  08/24/2006  DATE OF DISCHARGE:                                 CONSULTATION   CHIEF COMPLAINT:  Colon enterotomy.   HISTORY OF PRESENT ILLNESS:  I was asked to see this patient in the  operating room by Dr. Hart Rochester.  She was transferred down from [facility]  with a suspected leaking abdominal aortic aneurysm.  She has had  previous abdominal surgeries, including a subtotal colectomy.  Dr.  Hart Rochester began his procedure and there were multiple adhesions, including  adhesions down around the ileocolonic anastomosis, which is to the  sigmoid colon.  There were some dense adhesions from the loop of small  bowel to this portion of the colon and in removing the small bowel there  was a small defect created in the colon and Dr. Hart Rochester asked me to  evaluate and close this in the operating room.  That procedure is  dictated separately as an operative note.  Briefly, on physical exam,  the patient is under general anesthesia.  Her abdomen was explored.  Small bowel loop that had been adherent to the sigmoid was intact  without any enterotomy.  The terminal ilium anastomosis to the sigmoid  was intact.  This other adhesion had formed along one of the old staple  lines on the end of the sigmoid and there was a 3-cm enterotomy with  some bridges of mucosa that were still intact.  There was hardly any  contamination in the area.  No other abnormalities were noted.  I  proceeded with closure in the operating room and, again, that was  dictated separately.      Gabrielle Dare Janee Morn, M.D.  Electronically Signed     BET/MEDQ  D:  08/24/2006  T:  08/25/2006  Job:  [id number]   cc:   Quita Skye. Hart Rochester, M.D.

## 2010-11-11 NOTE — Discharge Summary (Signed)
NAMEJOHNICA, ARMWOOD                           ACCOUNT NO.:  [id number]   MEDICAL RECORD NO.:  [id number]                   PATIENT TYPE:  INP   LOCATION:  A309                                 FACILITY:  [facility]   PHYSICIAN:  Dalia Heading, M.D.               DATE OF BIRTH:  12-Feb-1935   DATE OF ADMISSION:  11/10/2003  DATE OF DISCHARGE:  11/17/2003                                 DISCHARGE SUMMARY   HOSPITAL COURSE:  The patient is a 76 year old black female with a history  of perforated sigmoid diverticulitis who was admitted to the hospital for  bowel preparation and surgery. She went to the operating room on Nov 11, 2003 and underwent a subtotal colectomy. She tolerated the surgery well. Her  postoperative course was remarkable for the leukocytosis which was felt to  be secondary to the surgery and her stress-dosed steroid therapy. She did  receive several units of packed red blood cells due to anemia from the  surgery. Her diet was advanced without difficulty once her bowel function  returned.   The patient is being discharged home on Nov 17, 2003 in fair and stable  condition.   DISCHARGE INSTRUCTIONS:  The patient is to follow up with Dr. Franky Macho  on Nov 24, 2003.   DISCHARGE MEDICATIONS:  Vicodin one to two tablets p.o. q.4h. p.r.n. pain.  She is to resume all of her previous medications as prescribed.   FOLLOW UP:  She will be followed by home health for ambulation purposes.   PRINCIPAL DIAGNOSES:  1. History of sigmoid perforation/diverticulitis.  2. History of gout.  3. Anemia, resolving.  4. Hypertension.  5. Hypothyroidism.   PRINCIPAL PROCEDURE:  Subtotal colectomy on Nov 11, 2003.     ___________________________________________                                         Dalia Heading, M.D.   MAJ/MEDQ  D:  11/17/2003  T:  11/18/2003  Job:  [id number]   cc:   Donna Bernard, M.D.  [address]  [place]  [place] [address]  Fax: [phone]

## 2010-11-11 NOTE — Op Note (Signed)
[name], [name]               ACCOUNT NO.:  [id number]   MEDICAL RECORD NO.:  [id number]          PATIENT TYPE:  INP   LOCATION:  2302                         FACILITY:  [facility]   PHYSICIAN:  Quita Skye. Hart Rochester, M.D.  DATE OF BIRTH:  1935-02-08   DATE OF PROCEDURE:  08/24/2006  DATE OF DISCHARGE:                               OPERATIVE REPORT   PREOPERATIVE DIAGNOSIS:  Symptomatic and expanding infrarenal abdominal  aortic aneurysm, rule out leak.   POSTOPERATIVE DIAGNOSIS:  Symptomatic and expanding infrarenal abdominal  aortic aneurysm, rule out leak.   OPERATION:  1. Lysis of multiple intra-abdominal adhesions.  2. Resection and grafting of expanding and symptomatic infrarenal      abdominal aortic aneurysm with insertion of 16-mm aortic tube      graft.  3. Repair of a small colonic laceration by Dr. Violeta Gelinas with      intraoperative consultation.   ANESTHESIA:  General endotracheal.   ESTIMATED BLOOD LOSS:  400 mL.   COMPLICATIONS:  None.   PROCEDURE:  The patient was taken immediately to the operating room,  placed in supine position at which time satisfactory general  endotracheal anesthesia was administered.  Radial arterial line and Swan-  Ganz catheter inserted by anesthesia.  The patient's vital signs were  stable with blood pressure 130 systolic, heart rate in the 60s.  Midline  incision was made from the xiphoid to the pubis, carried down through  subcutaneous tissue and linea alba using the Bovie.  Peroneal cavity was  entered, and there was no blood within the peritoneal cavity.  The  multiple intra-abdominal adhesions were then lysed with Metzenbaum  scissors.  Most of these were soft adhesions with the exception of the  pelvis where some loops of intestines were densely adherent.  A subtotal  colectomy had been previously performed with an ileosigmoid colostomy.  The stomach, duodenum and small bowel were unremarkable with the  exception of  adhesions.  Liver was smooth, and no palpable masses were  noted.  Gallbladder was normal in appearance.  No stones were palpable.  After extensive lysis of these adhesions, there was one very dense  adhesion between a loop of small intestine and the sigmoid colon in the  pelvis which precluded repairing the aneurysm because of its location  and obstructing the view, and while attempting to very carefully lyse  this adhesion, a small rent was made in the sigmoid colon, but there was  no gross spillage in the peritoneal cavity.  This was carefully  protected, and Dr. Violeta Gelinas was consulted intraoperatively.  He  repaired this with some imbricated 2-0 silk sutures which satisfied this  problem, and we felt that it was best to proceed with resection and  grafting of this symptomatic aneurysm.  After thorough irrigation of the  peritoneal cavity and removal of these instruments used for this repair  from the table, the surgical team changed gown and gloves, and further  irrigation was performed.  The procedure then continued.  Intestines  reflected to the right side.  Retroperitoneum incised.  Aneurysm was  noted  in the infrarenal location.  There was a lot of edema surrounding  this aneurysm and some murky fluid, particularly on the left side  outside of the wall of the aneurysm.  There was no frank pus and no  hematoma outside of the aneurysm, although there definitely was some  inflammation in this retroperitoneal tissues.  Aneurysm was controlled  proximally distal to renal arteries, and both common iliac arteries were  controlled.  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  The  patient given was 25 g of mannitol and heparin and then occluded distal  to the renal arteries and both common iliac arteries occluded with  vascular clamps.  Aneurysm was opened anteriorly.  A piece of the wall  was sent for cultures as was some laminated thrombus from within.  A few  lumbars  oversewn with 2-0 silk figure-of-eight sutures.  A 16-mm  Hemashield Dacron graft anastomosed end-to-end to the aortic stump using  continuous 3-0 Prolene and end-to-end to the terminal aorta with  continuous 3-0 Prolene.  After appropriate flushing proximally and  distally, this was completed, clamps were released, and there were no  leaks.  Protamine was given to reverse the heparin.  Following adequate  hemostasis, aneurysm closed over the graft with 3-0 Vicryl.  Retroperitoneum approximated with 3-0 Vicryl, and following further  thorough irrigation, linea alba closed with #1 Prolene, skin with clips.  Sterile dressing applied.  The patient taken to the surgical intensive  care in stable condition.  She received 1 unit of blood from [facility] [employer].  Had excellent urinary output and was stable hemodynamically and received  no bank blood.           ______________________________  Quita Skye Hart Rochester, M.D.     JDL/MEDQ  D:  08/24/2006  T:  08/25/2006  Job:  [id number]   cc:   Donna Bernard, M.D.

## 2010-11-11 NOTE — Consult Note (Signed)
NAMESHANTANA, CHRISTON               ACCOUNT NO.:  [id number]   MEDICAL RECORD NO.:  [id number]          PATIENT TYPE:  INP   LOCATION:  A302                          FACILITY:  [facility]   PHYSICIAN:  Lionel December, M.D.    DATE OF BIRTH:  1934-07-19   DATE OF CONSULTATION:  DATE OF DISCHARGE:                                 CONSULTATION   REASON FOR CONSULTATION:  Unrelenting lower abdominal pain of 6 days  duration, workup negative thus far.   HISTORY OF PRESENT ILLNESS:  Lillianah is a 76 year old African-American  female who was in usual state of health until 6 days prior to admission,  when she more less re-developed pain in her right lower and mid abdomen.  Pain was sharp, radiating superiorly.  She experienced nausea.  Pain was  unbearable.  She came to hospital and was admitted.  She came to  emergency room, and she was hospitalized.  Her WBC on admission was  normal.  She had abdominopelvic CT on August 21, 2006.  Showed fatty  liver, stable left adrenal adenoma, 4.7-centimeter abdominal aortic  aneurysm.  The pancreas was normal.  There was questionable thickening  to sigmoid colon.  The patient was seen in consultation by Dr. Lovell Sheehan.  She had a EGD and a flexible sigmoidoscopy 2 days ago.  CLO-test was  performed was negative.  No abnormality was noted on sigmoidoscopy.  Dr.  Gerda Diss requested MRA of her abdomen yesterday, but it was not done  because of elevated serum creatinine or low GFR.  The patient now  complains of hypogastric pain to the right of midline.  She has had  nausea.  She did throw up yesterday before.  She has not experienced any  fever, chills.  She has three to four bowel movements which have been  explosive, since she has had her colonic surgery.  There is no history  of melena or rectal bleeding.  She is on a soft diet, and she was able  to eat some of her food at breakfast and lunch.  She has not lost any  weight recently.  She continues to smoke cigarettes.   She has known AAA,  and she has been under supervision.  She has seen Dr.Lawson or one of  his associates last year.   MEDICATIONS:  She is presently on:  1. Allopurinol 3 mg p.o. daily.  2. Lipitor 5 mg daily.  3. Vasotec 10 mg p.o. daily.  4. Synthroid 100 mcg daily.  5. Protonix 40 mg IV q.24 h.  6. Paxil 30 mg p.o. q. daily.  7. Verapamil 120 mg p.o. daily.  8. Xanax 4.5 mg t.i.d. p.r.n.  9. Morphine sulfate 8 mg IV q.2-3 p.r.n., IV q. p.r.n. pain.  10.Zofran 4 mg IV q.6 p.r.n.   PAST MEDICAL HISTORY:  She has hypertension, depression, anxiety,  neurosis and chronic insomnia, gout and GERD.  She was seen by me in  August2007 for epigastric pain and nausea.  She had non-erosive  gastritis followed by an ultrasound which was negative for  cholelithiasis.  She had abdominal aortic aneurysm  measuring 4.3 cm.  Bile duct measured 3 mm and stable hepatic cyst and fatty infiltration.   PAST SURGICAL HISTORY:  She has history of diverticulitis.  She had  surgery in [id number] for colonic perforation.  She had a near total  colectomy with ileocolic anastomosis.  Other surgeries include thyroid  ectomy for goiter and now she is on replacement therapy, and she had  hysterectomy years ago.   ALLERGIES:  TO DILAUDID RESULTING IN CONFUSION.   FAMILY HISTORY:  Positive for diabetes and CHF in two sisters.  Mother  died of MI in her 16s.  She lost one brother, renal failure at age 39.   SOCIAL HISTORY:  She is divorced.  She has two children.  She is a  retired from [employer], where she worked for 30 years or more.  She  does not drink alcohol.  She is been smoking cigarettes for 50 years,  half to one pack per day.   PHYSICAL EXAM:  GENERAL:  Pleasant, well-developed, well-nourished Afro-  American female who is sitting up at edge of the bed and appears to be  in some pain.  She is little bit drowsy, because she is just received  her narcotic.  VITAL SIGNS:  Weight is not available.   Pulse 73 per minute, blood  pressure 177/75.  Temp is 98.3 and respiratory rate is 20.  HEENT:  Conjunctivae is pink.  Sclerae is nonicteric.  Oral pharyngeal  mucosa is normal.  He has complete upper and partial lower plate.  NECK:  No neck masses or thyromegaly noted.  She does not have carotid  bruits.  CARDIAC:  Exam with regular rhythm.  Normal S1-S2.  No murmur or gallop  noted.  LUNGS:  Clear to auscultation.  ABDOMEN:  Full.  Bowel sounds are normal.  No bruits noted.  On  palpation, abdomen is soft.  She is mild, tender in hypogastric area  with guarding but no rebound.  No masses noted.  RECTAL:  Examination deferred.   LABS:  From admission, WBC 6.3, H&H 11.9 and 35.8, platelet count 222K.  Her sodium was 137, potassium 3.9, chloride 109, CO2 22, glucose 119,  BUN eight, creatinine 1.44, bilirubin 0.7, AP 74, AST 37, ALT 26, total  protein 6.3 with albumin of 3.9, calcium was 8.4.  Her BUN and  creatinine on August 20, 2006 were 10 and 1.38.  Her GFR was estimated  to be 46 mL per minute.  Abdominopelvic CT reviewed, findings as above.   ASSESSMENT:  Adlee is a 76 year old African-American female who was been  experiencing unrelenting pain for the last 6 days.  She had an  essentially negative abdominopelvic CT other than AAA measuring 4.7 mm,  but no evidence of dissection or thrombi.  She also had a negative EGD  and a flexible sigmoidoscopy by Dr. Lovell Sheehan.  The patient's presentation  is worrisome for intestinal ischemia or internal herniation.  Given that  she has aneurysm and she has been a smoker for a century, this needs to  be ruled out as soon as possible.  The patient has been on IV fluids,  and I expect her creatinine should be done, and we should be able to do  this study as soon as possible.   RECOMMENDATIONS:  She will have stat BUN and creatinine, and once we  have documented that her creatinine is normal, we will proceed with CT angio abdomen, and further  recommendation will be made, depending on  findings of CT.  If CT does not reveal any abnormality to account for  pain, she may need a diagnostic laparoscopy or laparotomy.   We would like to thank Dr. Lubertha South for the opportunity to  participate in the care of this nice lady.      Lionel December, M.D.  Electronically Signed     NR/MEDQ  D:  08/24/2006  T:  08/25/2006  Job:  [id number]   cc:   Donna Bernard, M.D.  Fax: [phone]

## 2010-11-11 NOTE — Discharge Summary (Signed)
[name], [name]               ACCOUNT NO.:  [id number]   MEDICAL RECORD NO.:  [id number]          PATIENT TYPE:  INP   LOCATION:  A302                          FACILITY:  [facility]   PHYSICIAN:  Donna Bernard, M.D.DATE OF BIRTH:  [birth year]   DATE OF ADMISSION:  08/20/2006  DATE OF DISCHARGE:  02/29/2008LH                               DISCHARGE SUMMARY   FINAL DIAGNOSES:  1. Partial rupture abdominal aneurysm.  2. Abdominal pain.  3. History of ischemic bowel with status post colectomy.  4. Hypothyroidism.  5. Hypertension.  6. Hyperlipidemia.  7. Chronic smoker.   FINAL DISPOSITION:  The patient transferred to [facility] for urgent  surgery of the abdomen.   INITIAL H&P:  Please see H&P as dictated.   HOSPITAL COURSE:  This patient is a 76 year old black female, with  history of numerous chronic medical problems as noted above, who  presented to the emergency room tonight for admission with colicky upper  abdominal pain.  The ER did blood work.  There was slight elevation of  amylase.  They felt it was due to biliary colic.  She had right upper  quadrant pain.  The ultrasound returned normal.  She returned to the  office later in the day in ongoing distress.  She noted midabdominal  pain, radiating evening down to her right hip.  With her history of  known aneurysm, we went ahead and did a CT scan of the abdomen.  This  revealed a stable aortic aneurysm.  The patient continued to have pain.  We consulted the surgeons due to her history of bowel ischemia.  They  felt the patient may be experiencing some bowel ischemia.  Dr. Lovell Sheehan  performed a flexible sigmoidoscopy and EGD.  There were no significant  changes noted on the sigmoidoscopy and the EGD showed a healed ulcer.  There was really not enough to suggest ongoing risk for pain.  The  following day, the patient continued to have pain, so we went ahead and  consulted GI.  The intern recommended a CT angiogram to check  for  evidence of colon ischemia.  We were all surprised when the CT angiogram  revealed no evidence of colon or intestinal ischemia but a definite  change in the appearance of the aneurysm including the appearance of  partial dissection and some periaortic involvement.  Based on this, I  urgently consulted the vascular surgeon on call for the patient's usual  vascular surgeon.  He agreed to accept the patient in transfer due to  her difficulties.  The patient was transferred.  Diagnosis and  disposition as noted above.     Donna Bernard, M.D.  Electronically Signed    WSL/MEDQ  D:  09/18/2006  T:  09/19/2006  Job:  [id number]

## 2010-11-11 NOTE — Consult Note (Signed)
[name], [name]               ACCOUNT NO.:  [id number]   MEDICAL RECORD NO.:  [id number]          PATIENT TYPE:  AMB   LOCATION:  DAY                           FACILITY:  [facility]   PHYSICIAN:  Lionel December, M.D.    DATE OF BIRTH:  [birth year]   DATE OF CONSULTATION:  DATE OF DISCHARGE:                                   CONSULTATION   PRESENTING COMPLAINT:  Epigastric pain and nausea.   HISTORY OF PRESENT ILLNESS:  [name] is a 76 year old African-American female  who is referred through courtesy of Dr. Lubertha South for GI evaluation.  Her  present symptoms began about a month ago.  She has had a single episode of  vomiting but she has been nauseated virtually every day.  Nausea may get  worse with meals but not always.  She also complains of sharp epigastric  pain which is at times intense.  Her appetite has been up and down but she  has not lost any weight.  There is no history of hematemesis, melena or  rectal bleeding.  She states her heartburn is been well controlled with  therapy.  She takes one to two Aleve per day, primarily for headache.  She  says Tylenol does not work.  She was seen by Dr. Lubertha South.  She had  Reglan and Carafate added but without any improvement.  She did have  hemoccult by him which was negative.  Review of the systems is negative for  fever, chills, night sweats, chest pain or dyspnea.   She is on:  1. Verapamil ER 120 mg daily.  2. Paroxetine 30 mg daily.  3. KCl 20 mEq daily.  4. Allopurinol 300 mg daily.  5. Synthroid 100 mcg daily.  6. Nexium 40 mg q.a.m.  7. Xanax 0.5 mg t.i.d.  8. __________ 10 mg daily.  9. Enalapril 10 mg daily.  10.Lipitor 10 mg daily.  11.Reglan 5 mg a.c.  12.Carafate 1 g t.i.d.  13.Imodium p.r.n.  14.Aleve one to two a day p.r.n.   PAST MEDICAL HISTORY:  Medical problems include hypertension, depression,  anxiety neurosis, and insomnia.  She also has gout and GERD.   History of diverticulitis.  Her last  colonoscopy was in May 2005 by Dr.  Jena Gauss.  She had colitis with ulceration.  She had a colonoscopy by Dr. Jena Gauss  in May 2005.  She had ulceration at the transverse colon as well as hepatic  flexure.  Chronic active colitis with ulceration.  She had surgery by Dr.  Lovell Sheehan and found to have colonic perforation, possibly ischemic in origin,  and she had near-total colectomy with ileocolonic anastomosis and has not  had any problems since then.  Other surgeries include thyroidectomy years  ago for goiter and she is on replacement therapy, and she has had a  hysterectomy years ago.   ALLERGIES:  DILAUDID, which resulted in confusion.   FAMILY HISTORY:  Mother died of MI in her late 14s.  Father's health status  unknown.  One brother died of renal failure at age 38.  One sister is doing  fair.  Two other sisters have had DM and CHF.   SOCIAL HISTORY:  She is divorced.  She has two children.  She is retired  from [employer],  she does not drink alcohol.  She has been smoking about  a half-a-pack a day for the last 45 years.   PHYSICAL EXAMINATION:  GENERAL:  Pleasant, mildly-obese, African-American  female who is in no acute distress.  VITAL SIGNS:  She weighs 189 pounds.  She is 5 feet 7 inches tall.  Pulse 64  per minute, blood pressure 178/80, temperature is 98.4.  HEENT:  Conjunctivae are pink.  Sclerae are nonicteric.  Oropharyngeal  mucosa is normal.  She has complete upper and partial lower dental plate.  No neck masses or thyromegaly noted.  CARDIAC:  With regular rhythm.  Normal S1, S2.  No murmur or gallop noted.  LUNGS:  Auscultation of lungs revealed few dry crackles at bases.  ABDOMEN:  Protuberant.  Bowel sounds are normal.  She has moderate  midepigastric tenderness.  No organomegaly or masses noted.  RECTAL:  Examination is deferred as she had one by Dr. Lubertha South  recently.  EXTREMITIES:  No clubbing or edema noted.   Laboratory that are available for review are,  however from Oct 24, 2005.  Her  LFTs were normal.   ASSESSMENT:  [name] is a 76 year old African-American female who presents with  a 69-month history of epigastric pain with nausea and a single episode of  emesis.  She has chronic gastroesophageal reflux disease and has been  maintained on a proton pump inhibitor which has helped with gastroesophageal  reflux disease but not this pain.  Empiric trial with Reglan and Carafate  did not help her symptoms.  She uses Aleve fairly regularly.  She therefore  could have peptic ulcer disease.  If this is not the case, she needs to be  evaluated for pancreatobiliary disease.   RECOMMENDATIONS:  1. The patient advised to discontinue Aleve, the Reglan and Carafate.  2. She will have a a CBC and LFTs at the time of endoscopy.  3. Diagnostic esophagogastroduodenoscopy be performed at [facility] in the      future.  Further workup will depend on findings at EGD.   We would like to thank Dr. Lubertha South for the opportunity to participate  in the care of this nice lady.      Lionel December, M.D.  Electronically Signed     NR/MEDQ  D:  02/15/2006  T:  02/15/2006  Job:  [id number]   cc:   [facility], M.D.  Fax: [phone]

## 2010-11-11 NOTE — Op Note (Signed)
NAMERayvn, [name]                           ACCOUNT NO.:  [id number]   MEDICAL RECORD NO.:  [id number]                   PATIENT TYPE:  AMB   LOCATION:  DAY                                  FACILITY:  [facility]   PHYSICIAN:  R. Roetta Sessions, M.D.              DATE OF BIRTH:  [birth year]   DATE OF PROCEDURE:  11/03/2003  DATE OF DISCHARGE:                                 OPERATIVE REPORT   PROCEDURE:  Colonoscopy with biopsy.   INDICATIONS FOR PROCEDURE:  The patient is a 76 year old lady who was  admitted to the hospital with abdominal pain and rectal bleeding.  Last  month, she was found to have free retroperitoneal air with a question of an  inflammatory process about the colon.  She did have some mild diverticular  changes.  She responded with conservative therapy.  She had rectal bleeding  to the point of receiving multiple units of packed red blood cells.  Dr.  Lovell Sheehan saw the patient.  Plans were being made for a colonoscopy followed  by sigmoid resection as appropriate.  Colonoscopy is now being done.  This  approach has been discussed with the patient at length.  The potential  risks, benefits, and alternatives have been reviewed and questions answered.  Please see the documentation in the medical record.   PROCEDURE:  O2 saturation, blood pressure, pulses, and respirations were  monitored throughout the entirety of the procedure.  Conscious sedation was  with Versed 4 mg IV, Demerol 100 mg IV in divided doses.  The instrument  used was the Olympus video chip pediatric and adult colonoscope.   FINDINGS:  Digital rectal examination revealed no abnormalities.   ENDOSCOPIC FINDINGS:  The prep was suboptimal.   Rectum:  Examination of the rectal mucosa including retroflex view of the  anal verge revealed only internal hemorrhoids.   Colon:  The colonic mucosa was surveyed from the rectosigmoid junction  through the left and transverse colon with the pediatric colonoscope  initially because of recurrent looping.  I elected to pull the pediatric  scope out and go with the adult scope.  I was able to advance the scope all  the way to the cecum.  The cecum, ileocecal valve, and appendiceal orifice  were seen and photographed for the record.  From this level, the scope was  slowly withdrawn.  All previously mentioned mucosal surfaces were again  seen.  There was some tortuosity of the sigmoid colon.  However, I really  did not see any evidence of diverticulosis.  I did note multiple 3 to 5-mm  erosions and ulcerations scattered throughout the colon from the hepatic  flexure to the distal transverse segment.  Please see photos.  No evidence  of neoplasm.  Biopsies of the erosions and ulcers were taken for histologic  study.  The patient tolerated the procedure well and was reactive in  endoscopy.   IMPRESSION:  1. Internal hemorrhoids.  Otherwise normal rectum.  2. Scattered erosions and ulcerations (small) throughout the transverse     colon, limited to the hepatic flexure proximally.  3. Some tortuosity of the left colon, but the mucosa otherwise appeared     normal.  4. I was unable to identify a single diverticulum.  5. Today's findings are interesting.  This lady presented with a perforation     and significant hematochezia.  The hematochezia is not commonly     associated with diverticulitis.  I have to wonder if this lady did not     suffer a bout of significant ischemic colitis with through-and-through     perforation.   RECOMMENDATIONS:  1. Hold off on surgical intervention at this time.  Discussed the case with     Dr. Lovell Sheehan.  2. Proceed with a CT angiogram to look at her mesenteric vasculature     further.  3. Follow up on biopsies.  4. Left adrenal adenoma on prior CT, nonspecific.  Further follow up per Dr.     Gerda Diss.  5. Further recommendations to follow.   ADDENDUM:  The patient denies taking any nonsteroidals recently.       ___________________________________________                                            Jonathon Bellows, M.D.   RMR/MEDQ  D:  11/03/2003  T:  11/03/2003  Job:  [id number]   cc:   Donna Bernard, M.D.  [address]  [place]  [place] [address]  Fax: [phone]   Dalia Heading, M.D.  [address] [place]  [place] [address]  Fax: [phone]

## 2010-11-11 NOTE — Op Note (Signed)
[name], [name]                           ACCOUNT NO.:  [id number]   MEDICAL RECORD NO.:  [id number]                   PATIENT TYPE:  INP   LOCATION:  IC03                                 FACILITY:  [facility]   PHYSICIAN:  R. Roetta Sessions, M.D.              DATE OF BIRTH:  March 09, 1935   DATE OF PROCEDURE:  10/01/2003  DATE OF DISCHARGE:                                 OPERATIVE REPORT   PROCEDURE:  Bedside ICU diagnostic esophagogastroduodenoscopy.   ENDOSCOPIST:  Gerrit Friends. Rourk, M.D.   INDICATIONS FOR PROCEDURE:  The patient is a 76 year old lady admitted to  the hospital yesterday with abdominal pain, fever, leukocytosis and  significant hematochezia necessitating transfusion.  CT demonstrates  inflammatory changes around the left colon and close approximation to her  AAA.  This was a noncontrast study and further delineation was not possible.  She has a history of peptic ulcer disease.  She was treated for H. pylori  back in 2001.  She was taking aspirin daily prior to admission.  EGD is now  being done to rule out a lesion in her upper GI tract which produced rapid  transit bleeding.   This approach has been discussed with the patient and multiple family  members, previously, at the beside.  The potential risks, benefits, and  alternatives have been reviewed.  Please see the medical record.   PROCEDURE NOTE:  O2 saturation, blood pressure, pulse and respirations were  monitored throughout the entirety procedure.  Conscious sedation: Versed 2  mg IV, Demerol 50 mg IV in divided doses   INSTRUMENT:  Olympus GIF-140 gastroscope.   FINDINGS:  Examination of the tubular esophagus revealed no mucosal  abnormalities. The EG junction was easily traversed.   STOMACH:  The gastric cavity was empty.  It insufflated well with air.  A  thorough examination of the gastric mucosa including a retroflex view of the  proximal stomach and esophagogastric junction demonstrated an  approximately  2-cm, stellate scar located on the distal greater curvature; likely site of  prior peptic ulcer.  The remainder of the gastric mucosa appeared normal.  The pylorus was patent and easily traversed.  Examination of the bulb and  second portion revealed 2 small 2-mm nodules in the bulb of doubtful  clinical significance.  Otherwise the mucosa in the bulb and second portion  appeared normal.   THERAPEUTIC/DIAGNOSTIC MANEUVERS:  None.   It is notable that there was no blood in the upper GI tract.  The patient  tolerated the procedure well and was reacted at the bedside.   IMPRESSION:  1. Normal esophagus.  2. Stellate scar consistent with old peptic ulcer disease, greater     curvature, otherwise normal stomach.  3. Two small nodules in the bulb of doubtful clinical significance, not     manipulated.  Otherwise the bulb and second portion appears normal.   DISCUSSION:  The patient  appears to have bleed from her more distal GI  tract.   RECOMMENDATIONS:  1. CT of the abdomen and pelvis with IV and oral contrast when feasible.  2. Follow H&H closely.  3. Check CBC and MET-7 tomorrow morning.      ___________________________________________                                            Jonathon Bellows, M.D.   RMR/MEDQ  D:  10/01/2003  T:  10/01/2003  Job:  [id number]   cc:   Dalia Heading, M.D.  [address], [place]  [place] [address]  Fax: [phone]   W. Simone Curia, M.D.  [address]  [place]  [place] [address]  Fax: [phone]   R. Roetta Sessions, M.D.  [address]  [place]  [place] [address]  Fax: [phone]

## 2010-11-11 NOTE — Op Note (Signed)
[name], [name]               ACCOUNT NO.:  [id number]   MEDICAL RECORD NO.:  [id number]          PATIENT TYPE:  AMB   LOCATION:  DSC                          FACILITY:  [facility]   PHYSICIAN:  Suzanna Obey, M.D.       DATE OF BIRTH:  [birth year]   DATE OF PROCEDURE:  05/18/2004  DATE OF DISCHARGE:                                 OPERATIVE REPORT   PREOPERATIVE DIAGNOSIS:  Left serous otitis media and adenoid hypertrophy.   POSTOPERATIVE DIAGNOSIS:  Left serous otitis media and adenoid hypertrophy.   OPERATION PERFORMED:  Left T-tube placement and biopsy of nasopharynx.   SURGEON:  Suzanna Obey, M.D.   ANESTHESIA:  General.   ESTIMATED BLOOD LOSS:  Less than 5 mL.   INDICATIONS FOR PROCEDURE:  This is a 76 year old who has had problems with  left-sided eustachian tube dysfunction for many years.  She has had previous  work-up for her enlarged adenoids which had appeared originally as a mass.  Her serology was negative and her biopsy was negative.  She now has had  extrusion of the left tympanostomy tube and needs to have another one  placed.  This time a T-tube will be placed.  She does not want to do this in  the office.  Since she is in the operating room, another biopsy just to make  sure that nothing has been missed regarding this eustachian tube  dysfunction.  She was informed of the risks and benefits of the procedure  including bleeding, infection, perforation, chronic drainage, hearing loss,  and scarring in the nasopharynx.  All questions were answered and consent  was obtained.   DESCRIPTION OF PROCEDURE:  The patient was taken to the operating room and  placed in supine position after adequate LMA general anesthesia, was placed  in the right gaze position.  The old tube was removed which was sitting on  the surface of the tympanic membrane.  Myringotomy was made in the anterior  inferior quadrant and serous effusion was suctioned.  T-tube was placed  without difficulty.   Ciprodex was instilled.  The Afrin pledgets were placed  into the nose and decongested the turbinates and a 0 degree endoscope was  used to examine the nasopharynx.  She really didn't have any significant  amount of abnormally enlarged adenoid tissue.  There was no  real mass evident.  A biopsy was taken from a portion of this tissue that  was there right in Rosenmueller's fossa.  This was sent for pathology and a  pledget was placed in the area to gain hemostasis.  There was no bleeding.  She was then awakened and brought to recovery in stable condition.  Counts  correct.       JB/MEDQ  D:  05/18/2004  T:  05/18/2004  Job:  [id number]   cc:   Donna Bernard, M.D.  [address]  [place]  [place] [address]  Fax: [phone]

## 2010-11-11 NOTE — Consult Note (Signed)
NAMEMARDEL, GRUDZIEN                           ACCOUNT NO.:  [id number]   MEDICAL RECORD NO.:  [id number]                   PATIENT TYPE:  INP   LOCATION:  IC03                                 FACILITY:  [facility]   PHYSICIAN:  R. Roetta Sessions, M.D.              DATE OF BIRTH:  [birth year]   DATE OF CONSULTATION:  09/30/2003  DATE OF DISCHARGE:                                   CONSULTATION   REASON FOR CONSULTATION:  Abdominal pain.   HISTORY OF PRESENT ILLNESS:  [name] is a pleasant 76 year old  African American female admitted to the hospital today with worsening of  abdominal pain.  When I saw this lady, she was quite uncomfortable and gave  me a somewhat sketchy history of intermittent abdominal pain, at least for  the past one month or so which has worsened significantly over the past two  days.  She has had a couple of episodes of nausea and vomiting.  No diarrhea  or constipation.  She was admitted by Dr. Gerda Diss.  Initial laboratory  evaluation revealed a white count of 22,400.  She was also noted to be  febrile to 102.  She had already had a chest x-ray and ultrasound on  admission.  Chest x-ray demonstrated nothing acute.  No free urine of the  diaphragm.  Ultrasound demonstrated grossly normal right upper quadrant  biliary tree, small hepatic cyst.  No obvious gallbladder disease, dilated  ducts, etc.  Previously noted AAA again seen.   She tells me she has seen Dr. Gerda Diss and Dr.  Katrinka Blazing recently as an  outpatient and some of her symptoms that she had been having were chronic  and felt to be musculoskeletal.  Additional labs of note were BUN 35,  creatinine 2.2, GOT 64, GPT 64, alkaline phosphatase 128, total bilirubin  0.7.   This lady was seen in the emergency department on August 27, 2003, by Dr.  [facility].  She underwent a CT of the abdomen at that time  which demonstrated some haziness of the mesenteric/omental fat on the  inferior/posterior  aspect of the stomach in close approximation to the  distal transverse/splenic flexure.  No obvious diverticulitis or  perforation.  No obvious peptic ulcer disease, but inflammatory changes were  felt to be nonspecific (I reviewed the films personally with Dr. Jena Gauss).  Of note, her serum amylase came back within normal limits at 78.   It is notable that this lady was admitted to the hospital back in 2000 with  abdominal pain, and based on the CT report, she had somewhat similar  findings of hazy mesenteric fat which is just under the left rectus sheath  near the stomach, more anteriorly than posteriorly.  She was seen by Dr.  Karilyn Cota in consultation.  Once there was concern about a mesenteric  contusion.  This lady also reports undergoing a colonoscopy in the last  couple of years by Dr. Katrinka Blazing for screening purposes.  Those records are  unavailable at this time for review.   PAST MEDICAL HISTORY:  1. Hypothyroidism status post thyroidectomy.  2. Hypertension.  3. History of coronary artery bypass graft.  4. History of depression and anxiety.  5. History of nephrolithiasis.  6. Hysterectomy.  7. Appendectomy.  8. Bilateral myringotomy tubes by Dr. Jearld Fenton in [place] in 2002.   MEDICATIONS ON ADMISSION:  Not available at this time.   ALLERGIES:  No known drug allergies.   REVIEW OF SYSTEMS:  As in history of present illness.   PHYSICAL EXAMINATION:  GENERAL APPEARANCE:  An uncomfortable, ill-appearing  76 year old lady.  VITAL SIGNS:  Temperature 102, pulse 88, blood pressure 111/51.  SKIN:  Warm and dry.  HEENT:  No scleral icterus.  Conjunctivae are pink.  Oral cavity:  No  lesions.  JVD is not prominent.  CHEST:  Lungs are clear to auscultation.  CARDIAC:  Regular rate and rhythm without murmurs, rubs or gallops.  ABDOMEN:  Full/distended, positive bowel sounds.  Tender throughout the left  upper and left lower quadrants.  She does have equivocal rebound.  No  obvious  mass or organomegaly.  EXTREMITIES:  No edema.  RECTAL:  When I saw this lady, she had just had a grossly bloody bowel  movement.   ADDITIONAL LABORATORY DATA:  Sodium 131, potassium 4.6, chloride 100, CO2  19, glucose 184, BUN 35, creatinine 2.2, calcium 8.7, total protein 6.6,  albumin 2.6.  GOT 64, GPT 64, alkaline phosphatase 128, bilirubin 0.7,  amylase 78.   IMPRESSION:  [name] is a 76 year old lady admitted to the  hospital with a crescendo abdominal pain in the setting of fever,  leukocytosis and gross hematochezia.   Chest x-ray and ultrasound failed to demonstrate acute abnormal or anything  that would explain her current presentation. I  am concerned about the  possibility of an intra-abdominal catastrophe in evolution (i.e. bowel  infarction versus perforated viscus or other process; with her hematochezia  with favor of the former diagnostic possibility at this time).   It is notable that she did have some dirty fat around her stomach and  distal transverse colon on CT August 27, 2003, that remains to be seen as to  whether or not this was a tell tale sign of the current process or separate  entity.  It is interesting to note that she had somewhat similar findings  reported in somewhat of a different area on a prior abdominal CT in 2000 in  which she presented with abdominal pain, __________ or mesenteric fat would  also be in the differential at this time.   RECOMMENDATIONS:  1. Proceed with a stat CT of the abdomen and pelvis with IV oral contrast.  2. Surgery consultation.  She has seen Dr. Katrinka Blazing before, but he is     reportedly out of town.  So, I therefore, consulted the surgeon on call,     Dr. Lovell Sheehan.  I have discussed the case with him.  He is coming to see     the patient.  3. Change Levaquin initiated to IV Cipro and Flagyl.  May need to dose down     with her elevated creatinine. 4. Hold Reglan for the time being.  Agree with the Protonix.   At  the time of this dictation, I was called back to the room, and Ms.  [name] experienced another large, grossly bloody bowel movement  with  clots.  I have discussed the case with Dr. Gerda Diss, and the patient will be  moved to the  ICU.  She will be typed and crossed for four units of packed red blood  cells.  Dr. Lovell Sheehan will be seeing this lady this evening.   I would like to thank Dr. Lovell Sheehan for allowing me to see this nice lady in  consultation.      ___________________________________________                                            Jonathon Bellows, M.D.   RMR/MEDQ  D:  09/30/2003  T:  10/01/2003  Job:  [id number]   cc:   Dirk Dress. Katrinka Blazing, M.D.  [address]  [place]  [place] [address]  Fax: [phone]   Cristi Loron, M.D.  [address]  [place]  [place] [address]  Fax: [phone]

## 2010-11-11 NOTE — H&P (Signed)
NAMELABERTA, [name]               ACCOUNT NO.:  [id number]   MEDICAL RECORD NO.:  [id number]          PATIENT TYPE:  INP   LOCATION:  2302                         FACILITY:  [facility]   PHYSICIAN:  Quita Skye. Hart Rochester, M.D.  DATE OF BIRTH:  01-24-1935   DATE OF ADMISSION:  08/24/2006  DATE OF DISCHARGE:                              HISTORY & PHYSICAL   REFERRING PHYSICIAN:  Dr. Dannielle Huh at [facility].   CHIEF COMPLAINT:  Expanding or symptomatic abdominal aortic aneurysm.   HISTORY OF PRESENT ILLNESS:  This 76 year old African-American female  patient was admitted to [facility] on February 25 after  complaining of severe epigastric pain.  She had been seen in the  emergency department the day prior to her admission with pain in the  upper abdomen which was described as colicky in nature and associated  with some nausea.  Only abnormality was an elevated amylase initially,  and she was admitted for further evaluation of epigastric discomfort  which radiated into the right hip area.  During her hospitalization, she  had a CT scan of the abdomen which revealed a 4.7-cm infrarenal aortic  aneurysm which was chronic and had no evidence of a leak.  This was  performed on the 26th of February.  She also had a GI evaluation with  sigmoidoscopy being performed having previously undergone a subtotal  colectomy for ischemic problems with the colon.  No abnormalities were  noted, but a CT scan was repeated today which revealed a significant  difference in the infrarenal abdominal aortic aneurysm with loss of some  of the contour on the left side near the inferior mesenteric artery.  There was no frank extramural hematoma, but a possible intramural  hematoma was suspected causing these symptoms, and the patient was  referred to [facility] for surgery.   PAST MEDICAL HISTORY:  1. Hypertension.  2. Hyperlipidemia.  3. Negative for coronary artery disease, stroke, COPD.   PREVIOUS SURGERIES:  Includes:  1. Hysterectomy.  2. Thyroidectomy.  3. Subtotal colectomy.   SOCIAL HISTORY:  The patient smokes a pack of cigarettes per day, does  not use alcohol.  She is divorced.   FAMILY HISTORY:  Is positive for:  1. Hypertension.  2. Diabetes.  3. Coronary artery disease.  4. Hyperlipidemia.   ALLERGIES:  DILAUDID.   MEDICATIONS:  Please see health history form.   PHYSICAL EXAM:  VITAL SIGNS:  Blood pressure 130/80, heart rate is 70,  respirations 14.  GENERALLY:  She is an alert and oriented female who is in no apparent  distress, although she is complaining of significant abdominal  discomfort.  She is not diaphoretic.  NECK:  Supple with 3+ carotid pulses palpable.  No palpable adenopathy  in the next.  CHEST:  Clear to auscultation.  CARDIOVASCULAR EXAM:  Has a regular rhythm, no murmurs.  ABDOMEN:  Soft with a tender pulsatile mass in the mid epigastric area.  EXTREMITIES:  Upper extremity pulses are 3+ bilaterally.  She has 3+  femoropopliteal and 2+ dorsalis pedis pulses palpable bilaterally.   I reviewed  the CT scan with Dr. Ruel Favors at [facility] and agree  that there has been a significant change over the last 72 hours,  suspicious for a hematoma and the intramural hematoma and the aneurysm  causing her symptoms.   IMPRESSION:  Symptomatic enlarging abdominal aortic aneurysm.   PLAN:  Take the patient immediately to the operating room for  exploration and resection and grafting of this aneurysm.           ______________________________  Quita Skye Hart Rochester, M.D.     JDL/MEDQ  D:  08/24/2006  T:  08/25/2006  Job:  [id number]

## 2010-11-11 NOTE — H&P (Signed)
NAMEANNICE, [name]                           ACCOUNT NO.:  [id number]   MEDICAL RECORD NO.:  [id number]                  PATIENT TYPE:   LOCATION:                                       FACILITY:  [facility]   PHYSICIAN:  Dalia Heading, M.D.               DATE OF BIRTH:  October 07, 1934   DATE OF ADMISSION:  DATE OF DISCHARGE:                                HISTORY & PHYSICAL   CHIEF COMPLAINT:  History of perforated sigmoid diverticulitis.   HISTORY OF PRESENT ILLNESS:  Patient is a 76 year old black female who was  recently discharged from [facility] after treatment for a  perforated sigmoid diverticulitis.  This perforation occurred into the  retroperitoneal space.  It has been resolving without difficulty.  She now  presents for an elective sigmoid colectomy.   PAST MEDICAL HISTORY:  Includes hypertension, hypothyroidism.   PAST SURGICAL HISTORY:  Hysterectomy, appendectomy, EGD, and colonoscopy in  the past.   CURRENT MEDICATIONS:  1. Verapamil.  2. Hydrochlorothiazide.  3. Synthroid.   ALLERGIES:  No known drug allergies.   REVIEW OF SYSTEMS:  Patient denies any recent bleeding disorders.  An EGD  done during her admission was negative for ulcer disease.  She denies any  recent chest pain, angina, CVA, or diabetes mellitus.   PHYSICAL EXAMINATION:  VITAL SIGNS:  Vital signs are stable.  GENERAL:  Patient is a well-developed and well-nourished white female in no  acute distress.  LUNGS:  Clear to auscultation with full breath sounds bilaterally.  HEART:  Regular rate and rhythm without S3, S4, or murmurs.  ABDOMEN:  Soft, nontender, nondistended.  No hepatosplenomegaly, masses, or  hernias are identified.   IMPRESSION:  Resolving sigmoid diverticulitis with perforation.   PLAN:  The patient is scheduled for a sigmoid colectomy on October 05, 2003.  She will have a preoperative colonoscopy by Dr. Jena Gauss on October 04, 2003.  The risks and benefits of the procedure,  including bleeding, infection, the  possibility of a blood transfusion, the possibility of a colostomy, were  fully explained to the patient, who gave informed consent.     ___________________________________________                                         Dalia Heading, M.D.   MAJ/MEDQ  D:  10/15/2003  T:  10/15/2003  Job:  [id number]   cc:   Donna Bernard, M.D.  [address]  [place]  [place] [address]  Fax: [phone]   R. Roetta Sessions, M.D.  [address]  [place]  [place] [address]  Fax: [phone]

## 2010-11-11 NOTE — Discharge Summary (Signed)
[name], [name]                           ACCOUNT NO.:  [id number]   MEDICAL RECORD NO.:  [id number]                   PATIENT TYPE:  INP   LOCATION:  A223                                 FACILITY:  [facility]   PHYSICIAN:  Donna Bernard, M.D.             DATE OF BIRTH:  June 16, 1935   DATE OF ADMISSION:  09/29/2003  DATE OF DISCHARGE:  10/06/2003                                 DISCHARGE SUMMARY   FINAL DIAGNOSES:  1. Acute diverticulitis with sigmoid perforation and retroperitoneal     involvement.  2. Gastrointestinal hemorrhage secondary to #1.  3. Significant anemia requiring transfusion, secondary to #2.  4. Hypertension.  5. Hypothyroidism.  6. Hyperlipidemia.  7. Chronic reflux.  8. Chronic anxiety.  9. Hypoalbuminemia.   FINAL DISPOSITION:  1. Patient discharged home.  2. Carnation Instant Breakfast t.i.d.  3. Diverticulosis diet recommended with no hard particulate foods.  4. Add iron gluconate b.i.d.  5. Cipro 500 b.i.d. for eight days, Flagyl 250 t.i.d. for eight days.  6. Maintain other meds.  7. Followup with Dr. Lovell Sheehan as scheduled.  8. Followup with myself as scheduled.   INITIAL HISTORY AND PHYSICAL:  Please see H&P as dictated.   HOSPITAL COURSE:  This patient, a 76 year old black female with a history of  hypertension, hypothyroidism, hyperlipidemia, intermittent gastritis and  recent diagnosis of abdominal aneurysm and adrenal adenoma, presented to the  office the day of admission feeling poorly with abdominal discomfort.  We  admitted her to the hospital.  The patient deteriorated quickly in the  hospital, passing a very large amount of blood and she was transferred to  the intensive care unit.  She was given urgent transfusions, GI consult was  obtained.  CT scan revealed diverticulitis of the sigmoid region with  posterior extension into the retroperitoneal space.  Patient was given IV  Flagyl along with Cipro.  A surgical consultation was  obtained.  Dr. Lovell Sheehan  felt that patient would eventually require surgery, recommending cooling  down all of this infection.  Over the next seven days the patient slowly  improved, her nutrition obviously has not been optimum, her albumin is on  the low side at this point.  Initially there was renal insufficiency with  elevated creatinine, creatinine now is down to 1.4.  Today the patient's  abdominal exam shows minimal tenderness, she is feeling much better, white  blood count is down to 12.8, her hemoglobin is hovering in the high 9s.  Though the patient is still clearly affected by her infection, the surgeon,  the gastroenterologist and myself  all feel the patient is ready for discharge.  She will be sent home with  diagnosis and disposition as noted above.  The patient will be brought back  in several weeks for a colonoscopy along with a laparotomy and surgery of  the involved area.  The patient discharged on October 06, 2003.  ___________________________________________                                         Donna Bernard, M.D.   WSL/MEDQ  D:  10/06/2003  T:  10/06/2003  Job:  [id number]

## 2010-11-11 NOTE — Discharge Summary (Signed)
NAMEMEGGEN, SPAZIANI                           ACCOUNT NO.:  [id number]   MEDICAL RECORD NO.:  [id number]                   PATIENT TYPE:  INP   LOCATION:  A331                                 FACILITY:  [facility]   PHYSICIAN:  Dalia Heading, M.D.               DATE OF BIRTH:  1934/10/03   DATE OF ADMISSION:  11/18/2003  DATE OF DISCHARGE:  11/27/2003                                 DISCHARGE SUMMARY   HOSPITAL COURSE SUMMARY:  Patient is a 76 year old black female status post  subtotal colectomy who presented back to the hospital with nausea and  vomiting.  She had a CT scan of the abdomen and pelvis which revealed a  diffuse ileus pattern with minimal fluid in the pelvis.  She was also noted  to have an elevated white blood cell count.  She was also noted to be  somewhat short of breath and she did have evidence of atelectasis in the  lower bases of her lungs.  A nasogastric tube was placed for decompression.  Her ileus subsequently resolved with time and her diet was advanced without  difficulty once her ileus resolved.  Her shortness of breath was evaluated  with both ultrasound of the thighs to rule out DVT which was ruled out as  well as a nuclear scan which was negative for a pulmonary embolus.  It is  felt that this is secondary to her atelectasis as well as history of  smoking.  She will require home oxygen therapy for hypoxemia on room air.  pO2 was noted to be at 58 on room air.   Patient is being discharged home on November 27, 2003 in fair and stable  condition.   DISCHARGE INSTRUCTIONS:  Patient is to follow up with Dr. Lilyan Punt in 10  days, Dr. Franky Macho on December 10, 2003.   DISCHARGE MEDICATIONS:  She is to resume all her medications as previously  prescribed.  In addition, she will have home O2 2 L nasal cannula, Diflucan  100 mg p.o. once daily x1 week, Levaquin 500 mg p.o. once daily x5 days,  Darvocet-N 100 one to two tablets p.o. q.6h. p.r.n. pain.   PRINCIPAL  DIAGNOSES:  1. Postoperative ileus, resolved.  2. Hypoxemia.  3. Leukocytosis, resolving.  4. History of anxiety disorder.   PRINCIPAL PROCEDURES:  None.     ___________________________________________                                         Dalia Heading, M.D.   MAJ/MEDQ  D:  11/27/2003  T:  11/27/2003  Job:  [id number]   cc:   Lorin Picket A. Gerda Diss, M.D.  [address], [address]  [place]  [place] [address]  Fax: [phone]

## 2010-11-11 NOTE — Op Note (Signed)
NAMENAHDIA, DOUCET               ACCOUNT NO.:  [id number]   MEDICAL RECORD NO.:  [id number]          PATIENT TYPE:  INP   LOCATION:  2302                         FACILITY:  [facility]   PHYSICIAN:  Gabrielle Dare. Janee Morn, M.D.DATE OF BIRTH:  September 08, 1934   DATE OF PROCEDURE:  08/24/2006  DATE OF DISCHARGE:                               OPERATIVE REPORT   PREOPERATIVE DIAGNOSIS:  1. Sigmoid colon enterotomy.  2. Leaking abdominal aortic aneurysm.   POSTOPERATIVE DIAGNOSES:  1. Sigmoid colon enterotomy.  2. Leaking abdominal aortic aneurysm.   PROCEDURE:  Primary colorrhaphy of the sigmoid colon.   SURGEON:  Gabrielle Dare. Janee Morn, M.D.   ASSISTANT:  Quita Skye. Hart [place], M.D.   HISTORY OF PRESENT ILLNESS:  The patient is a 76 year old female who was  transferred to Dr. Candie Chroman service from [facility] today with  findings consistent with a leaking abdominal aortic aneurysm.  She was  brought back to the operating room.  She has had previous subtotal  colectomy with an ileocolonic anastomosis at the sigmoid colon.  She has  a large amount of dense adhesions and during dissection of these  adhesions to free up a loop of small bowel from where it was adherent to  the sigmoid colon, a small enterotomy was created and Dr. Hart [place]  requested intraoperative consult.   PROCEDURE IN DETAIL:  The patient's small bowel and sigmoid colon were  explored.  The small bowel where the adhesions had been remained intact.  There was a 3-cm enterotomy at the sigmoid colon along an old staple  line with some bridging of mucosa.  There was hardly any contamination.  The ileocolonic anastomosis was in a different portion of the sigmoid  and that remained intact.  This area was cleaned off and a primary  repair was then done.  This was done with interrupted 2-0 silk and 3-0  silk Lembert sutures, achieving an excellent closure.  Again, there was  next to no contamination.  The colon remained viable and the  closure  appeared nicely intact.  This completed my portion of the procedure.  Dr. Candie Chroman portion is dictated separately.  The patient remains in the  operating room with Dr. Hart [place] in stable condition.      Gabrielle Dare Janee Morn, M.D.  Electronically Signed    BET/MEDQ  D:  08/24/2006  T:  08/25/2006  Job:  [id number]   cc:   Quita Skye. Hart [place], M.D.

## 2010-11-11 NOTE — Discharge Summary (Signed)
[name], [name]               ACCOUNT NO.:  [id number]   MEDICAL RECORD NO.:  [id number]          PATIENT TYPE:  INP   LOCATION:  2034                         FACILITY:  [facility]   PHYSICIAN:  Quita Skye. Hart Rochester, M.D.  DATE OF BIRTH:  11-20-1934   DATE OF ADMISSION:  08/24/2006  DATE OF DISCHARGE:                               DISCHARGE SUMMARY   PRIMARY DIAGNOSIS:  Symptomatic and expanding infrarenal abdominal  aortic aneurysm.   IN-HOSPITAL DIAGNOSES:  1. Sigmoid colon enterotomy preoperatively.  2. Acute blood loss anemia postoperatively.   SECONDARY DIAGNOSES:  1. Hypertension.  2. Hyperlipidemia.  3. Status post hysterectomy.  4. Status post thyroidectomy.  5. Status post subtotal colectomy.   OPERATIONS AND PROCEDURES:  1. Lysis of multiple intra-abdominal adhesions.  2. Resection and grafting of expanding and symptomatic infrarenal      abdominal aortic aneurysm with insertion of 16 mm aortic tube      graft.  3. Primary colorrhaphy of the sigmoid colon.   HISTORY OF PHYSICAL AND HOSPITAL COURSE:  The patient is a 72-year  African American female who was admitted to [facility] February  25 after complaining of severe epigastric pain.  She had been seen in  the emergency department the day prior to her admission with pain in the  upper abdomen which was described as colicky in nature and associated  with some nausea.  On admission noted to be elevated amylase.  During  the patient's hospitalization she had a CT scan of the abdomen which  revealed a 4.7 cm infrarenal aortic aneurysm which was chronic and had  no evidence of a leak.  This was performed February 26.  The patient  also underwent GI evaluation with a sigmoidoscopy being performed,  having previously undergone subtotal colectomy ischemic problems with  the colon.  There were no abnormalities noted.  Repeat CT scan on  February 29 revealed a significant difference in the infrarenal  abdominal  aortic aneurysm with loss of some of the contour on the left  side near the inferior mesenteric artery.  There was no frank extramural  hematoma but a possible intramural hematoma was suspected.  Following  evaluation of CT scan, Dr. Hart Rochester was consulted.  The patient was  transferred over to [facility] August 24, 2006, where the  patient was seen and evaluated by Dr. Hart Rochester.  Dr. Hart Rochester discussed with  the patient and family undergoing emergent evaluation of questionable  abdominal aortic aneurysm leak with repair.  Risks and benefits were  discussed in great detail.  The patient acknowledged her understanding  and to proceed.  For details of the patient's past medical history and  physical exam, please see dictated history and physical.   The patient was taken emergently to the operating room on August 24, 2006, where she underwent lysis of multiple intra-abdominal adhesions  with resection and grafting of expanding symptomatic infrarenal  abdominal aortic aneurysm with insertion of 16 mm aortic tube graft.  During this procedure the patient also had repair of a small colonic  laceration by Dr. Violeta Gelinas  with intraoperative consultation.  The  patient tolerated this procedure well and was transferred to the  intensive care unit in stable condition.  Postoperatively the patient  was noted to be hemodynamically stable.  The patient remained intubated  overnight and was able to be weaned and extubated postop day #1.  Following extubation the patient was noted to be alert and oriented x4.  The patient's hematocrit postop day #1 was 27%.  This was monitored  closely.  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  Cultures were obtained  for an aneurysmal wall.  The patient continued on the IV antibiotics.  The patient's cultures came back negative.  Antibiotics were  discontinued postop day #5.  The patient  discontinued her own NG tube  the evening of postop day #1.  Abdomen was noted not to be distended.  NG tube was left out and the patient kept n.p.o.  The evening of postop  day to the patient did have bowel movement.  She had good bowel sounds  postop day #3.  The patient was started on clear liquids postop day #4.  She tolerated this well and diet was advanced to soft diet and then to  regular diet by postop day #5.  The patient denied any nausea or  vomiting with regular diet.  Postoperatively she was out of bed  ambulating well.  Vital signs were monitored.  She was noted to have  slight hypertension postoperatively.  A low-dose beta blocker was  initiated.  Blood pressure was monitored and did stabilize.  Remaining  vital signs were stable.  She was able to be weaned off oxygen  saturating greater than 90% on room air.  The patient remained in normal  sinus rhythm.  Respiratory status remained stable.  She had good bowel  sounds prior to discharge home with incision clean, dry and intact and  healing well.  Bilateral lower extremities were warm and well-perfused  with bilateral 2+ DP and PT pulses.  During the patient's postoperative  course, Dr. Janee Morn did continue to follow the patient.   Labs obtained postop day #4 show white count 7.5, hemoglobin 9.0,  hematocrit 26.4, platelet count 197.  Sodium of 136, potassium 3.5,  chloride of 100, bicarb of 29, BUN of 7, creatinine 1.2, glucose 152.   The patient considered ready for discharge home in the a.m. postop day  #6, August 30, 2006.   FOLLOW-UP APPOINTMENTS:  A follow-up appointment will be arranged with  Dr. Hart Rochester for in 3 weeks.  Our office will contact the patient with  this information.  Our office will also contact the patient with a  follow-up appointment with a nurse in 2 weeks for staple removal.   ACTIVITY:  The patient was instructed no driving until released to do so, no heavy lifting over 10 pounds.  She is told  to ambulate 3-4 times  per day, progress as tolerated, and to continue breathing exercises.   INCISIONAL CARE:  The patient is told she may shower, washing her  incisions using soap and water.  She is to contact the office if she  develops any drainage or opening from any of her incision sites.   DIET:  The patient was educated on diet to be low-fat, low-salt.   DISCHARGE MEDICATIONS:  1. Lipitor 5 mg daily.  2. Synthroid 100 mcg daily.  3. Protonix 40 mg daily.  4. Paxil 30 mg  daily.  5. Allopurinol 300 mg daily.  6. Xanax 0.5 mg as used at home.  7. Nicotine patch 14 mg every 24 hours.  8. Lopressor 25 mg b.i.d.  9. Tylox one to two tablets q.4-6h. p.r.n.      Theda Belfast, PA    ______________________________  Quita Skye Hart Rochester, M.D.    KMD/MEDQ  D:  08/29/2006  T:  08/29/2006  Job:  [id number]   cc:   Gabrielle Dare. Janee Morn, M.D.

## 2011-03-17 LAB — URINE MICROSCOPIC-ADD ON

## 2011-03-17 LAB — BASIC METABOLIC PANEL
Calcium: 8.5
Calcium: 9.4
GFR calc Af Amer: 43 — ABNORMAL LOW
GFR calc non Af Amer: 28 — ABNORMAL LOW
GFR calc non Af Amer: 35 — ABNORMAL LOW
Glucose, Bld: 131 — ABNORMAL HIGH
Glucose, Bld: 136 — ABNORMAL HIGH
Potassium: 3.9
Sodium: 134 — ABNORMAL LOW
Sodium: 137

## 2011-03-17 LAB — HEPATIC FUNCTION PANEL
ALT: 46 — ABNORMAL HIGH
AST: 49 — ABNORMAL HIGH
Alkaline Phosphatase: 65
Total Protein: 7.6

## 2011-03-17 LAB — CBC
Hemoglobin: 10.7 — ABNORMAL LOW
Hemoglobin: 13.4
Platelets: 226
RBC: 3.17 — ABNORMAL LOW
RDW: 15.3
RDW: 15.5
WBC: 4.5
WBC: 6.5

## 2011-03-17 LAB — URINALYSIS, ROUTINE W REFLEX MICROSCOPIC
Bilirubin Urine: NEGATIVE
Glucose, UA: NEGATIVE
Specific Gravity, Urine: 1.025
pH: 5

## 2011-03-17 LAB — DIFFERENTIAL
Basophils Absolute: 0
Basophils Absolute: 0
Lymphocytes Relative: 11 — ABNORMAL LOW
Lymphocytes Relative: 38
Lymphs Abs: 0.7
Monocytes Absolute: 0.3
Monocytes Absolute: 0.5
Neutro Abs: 2.1
Neutro Abs: 5.5

## 2011-03-23 ENCOUNTER — Other Ambulatory Visit: Payer: Self-pay | Admitting: Family Medicine

## 2011-03-23 DIAGNOSIS — Z139 Encounter for screening, unspecified: Secondary | ICD-10-CM

## 2011-04-10 ENCOUNTER — Ambulatory Visit (HOSPITAL_COMMUNITY)
Admission: RE | Admit: 2011-04-10 | Discharge: 2011-04-10 | Disposition: A | Discharge status: Home / Self Care | Payer: Medicare Other | Source: Ambulatory Visit | Attending: Family Medicine | Admitting: Family Medicine

## 2011-04-10 DIAGNOSIS — Z139 Encounter for screening, unspecified: Secondary | ICD-10-CM

## 2011-04-10 DIAGNOSIS — Z1231 Encounter for screening mammogram for malignant neoplasm of breast: Secondary | ICD-10-CM | POA: Insufficient documentation

## 2011-08-16 DIAGNOSIS — E782 Mixed hyperlipidemia: Secondary | ICD-10-CM | POA: Diagnosis not present

## 2011-08-16 DIAGNOSIS — I2510 Atherosclerotic heart disease of native coronary artery without angina pectoris: Secondary | ICD-10-CM | POA: Diagnosis not present

## 2011-08-29 DIAGNOSIS — M898X9 Other specified disorders of bone, unspecified site: Secondary | ICD-10-CM | POA: Diagnosis not present

## 2011-08-29 DIAGNOSIS — M79609 Pain in unspecified limb: Secondary | ICD-10-CM | POA: Diagnosis not present

## 2011-08-29 DIAGNOSIS — M722 Plantar fascial fibromatosis: Secondary | ICD-10-CM | POA: Diagnosis not present

## 2011-09-19 DIAGNOSIS — M722 Plantar fascial fibromatosis: Secondary | ICD-10-CM | POA: Diagnosis not present

## 2011-09-19 DIAGNOSIS — M79609 Pain in unspecified limb: Secondary | ICD-10-CM | POA: Diagnosis not present

## 2011-12-05 DIAGNOSIS — Z79899 Other long term (current) drug therapy: Secondary | ICD-10-CM | POA: Diagnosis not present

## 2011-12-05 DIAGNOSIS — M109 Gout, unspecified: Secondary | ICD-10-CM | POA: Diagnosis not present

## 2011-12-05 DIAGNOSIS — E782 Mixed hyperlipidemia: Secondary | ICD-10-CM | POA: Diagnosis not present

## 2011-12-05 DIAGNOSIS — R252 Cramp and spasm: Secondary | ICD-10-CM | POA: Diagnosis not present

## 2011-12-05 DIAGNOSIS — E785 Hyperlipidemia, unspecified: Secondary | ICD-10-CM | POA: Diagnosis not present

## 2011-12-05 DIAGNOSIS — IMO0001 Reserved for inherently not codable concepts without codable children: Secondary | ICD-10-CM | POA: Diagnosis not present

## 2011-12-05 DIAGNOSIS — I10 Essential (primary) hypertension: Secondary | ICD-10-CM | POA: Diagnosis not present

## 2011-12-05 DIAGNOSIS — E039 Hypothyroidism, unspecified: Secondary | ICD-10-CM | POA: Diagnosis not present

## 2011-12-15 DIAGNOSIS — I872 Venous insufficiency (chronic) (peripheral): Secondary | ICD-10-CM | POA: Diagnosis not present

## 2011-12-15 DIAGNOSIS — N289 Disorder of kidney and ureter, unspecified: Secondary | ICD-10-CM | POA: Diagnosis not present

## 2012-02-08 DIAGNOSIS — I2510 Atherosclerotic heart disease of native coronary artery without angina pectoris: Secondary | ICD-10-CM | POA: Diagnosis not present

## 2012-02-08 DIAGNOSIS — I10 Essential (primary) hypertension: Secondary | ICD-10-CM | POA: Diagnosis not present

## 2012-02-08 DIAGNOSIS — Z79899 Other long term (current) drug therapy: Secondary | ICD-10-CM | POA: Diagnosis not present

## 2012-02-08 DIAGNOSIS — E782 Mixed hyperlipidemia: Secondary | ICD-10-CM | POA: Diagnosis not present

## 2012-02-20 DIAGNOSIS — M109 Gout, unspecified: Secondary | ICD-10-CM | POA: Diagnosis not present

## 2012-02-20 DIAGNOSIS — N189 Chronic kidney disease, unspecified: Secondary | ICD-10-CM | POA: Diagnosis not present

## 2012-02-20 DIAGNOSIS — I259 Chronic ischemic heart disease, unspecified: Secondary | ICD-10-CM | POA: Diagnosis not present

## 2012-02-20 DIAGNOSIS — I10 Essential (primary) hypertension: Secondary | ICD-10-CM | POA: Diagnosis not present

## 2012-03-15 ENCOUNTER — Ambulatory Visit (INDEPENDENT_AMBULATORY_CARE_PROVIDER_SITE_OTHER): Payer: Medicare Other | Admitting: *Deleted

## 2012-03-15 DIAGNOSIS — I10 Essential (primary) hypertension: Secondary | ICD-10-CM

## 2012-03-25 ENCOUNTER — Other Ambulatory Visit: Payer: Self-pay | Admitting: Family Medicine

## 2012-03-25 DIAGNOSIS — Z139 Encounter for screening, unspecified: Secondary | ICD-10-CM

## 2012-04-08 DIAGNOSIS — Z23 Encounter for immunization: Secondary | ICD-10-CM | POA: Diagnosis not present

## 2012-04-11 ENCOUNTER — Ambulatory Visit (HOSPITAL_COMMUNITY)
Admission: RE | Admit: 2012-04-11 | Discharge: 2012-04-11 | Disposition: A | Discharge status: Home / Self Care | Payer: Medicare Other | Source: Ambulatory Visit | Attending: Family Medicine | Admitting: Family Medicine

## 2012-04-11 DIAGNOSIS — Z1231 Encounter for screening mammogram for malignant neoplasm of breast: Secondary | ICD-10-CM | POA: Diagnosis not present

## 2012-04-11 DIAGNOSIS — Z139 Encounter for screening, unspecified: Secondary | ICD-10-CM

## 2012-04-22 DIAGNOSIS — D7589 Other specified diseases of blood and blood-forming organs: Secondary | ICD-10-CM | POA: Diagnosis not present

## 2012-04-22 DIAGNOSIS — Z79899 Other long term (current) drug therapy: Secondary | ICD-10-CM | POA: Diagnosis not present

## 2012-04-22 DIAGNOSIS — N189 Chronic kidney disease, unspecified: Secondary | ICD-10-CM | POA: Diagnosis not present

## 2012-04-22 DIAGNOSIS — R809 Proteinuria, unspecified: Secondary | ICD-10-CM | POA: Diagnosis not present

## 2012-05-07 DIAGNOSIS — Z961 Presence of intraocular lens: Secondary | ICD-10-CM | POA: Diagnosis not present

## 2012-05-08 DIAGNOSIS — F17200 Nicotine dependence, unspecified, uncomplicated: Secondary | ICD-10-CM | POA: Diagnosis not present

## 2012-05-08 DIAGNOSIS — D649 Anemia, unspecified: Secondary | ICD-10-CM | POA: Diagnosis not present

## 2012-05-08 DIAGNOSIS — I10 Essential (primary) hypertension: Secondary | ICD-10-CM | POA: Diagnosis not present

## 2012-06-12 DIAGNOSIS — E785 Hyperlipidemia, unspecified: Secondary | ICD-10-CM | POA: Diagnosis not present

## 2012-06-12 DIAGNOSIS — Z79899 Other long term (current) drug therapy: Secondary | ICD-10-CM | POA: Diagnosis not present

## 2012-06-12 DIAGNOSIS — I10 Essential (primary) hypertension: Secondary | ICD-10-CM | POA: Diagnosis not present

## 2012-06-12 DIAGNOSIS — E0590 Thyrotoxicosis, unspecified without thyrotoxic crisis or storm: Secondary | ICD-10-CM | POA: Diagnosis not present

## 2012-06-12 DIAGNOSIS — E039 Hypothyroidism, unspecified: Secondary | ICD-10-CM | POA: Diagnosis not present

## 2012-06-12 DIAGNOSIS — N289 Disorder of kidney and ureter, unspecified: Secondary | ICD-10-CM | POA: Diagnosis not present

## 2012-07-12 DIAGNOSIS — E039 Hypothyroidism, unspecified: Secondary | ICD-10-CM | POA: Diagnosis not present

## 2012-09-02 DIAGNOSIS — E039 Hypothyroidism, unspecified: Secondary | ICD-10-CM | POA: Diagnosis not present

## 2012-09-07 ENCOUNTER — Encounter: Payer: Self-pay | Admitting: *Deleted

## 2012-09-10 ENCOUNTER — Ambulatory Visit (INDEPENDENT_AMBULATORY_CARE_PROVIDER_SITE_OTHER): Payer: Medicare Other | Admitting: Family Medicine

## 2012-09-10 ENCOUNTER — Encounter: Payer: Self-pay | Admitting: Family Medicine

## 2012-09-10 VITALS — BP 158/74 | HR 90 | Wt 170.0 lb

## 2012-09-10 DIAGNOSIS — E039 Hypothyroidism, unspecified: Secondary | ICD-10-CM

## 2012-09-10 DIAGNOSIS — I10 Essential (primary) hypertension: Secondary | ICD-10-CM

## 2012-09-10 DIAGNOSIS — L299 Pruritus, unspecified: Secondary | ICD-10-CM | POA: Insufficient documentation

## 2012-09-10 DIAGNOSIS — L308 Other specified dermatitis: Secondary | ICD-10-CM

## 2012-09-10 MED ORDER — HYDROXYZINE HCL 25 MG PO TABS: 25.0000 mg | ORAL_TABLET | Freq: Three times a day (TID) | ORAL | Status: DC | PRN

## 2012-09-10 MED ORDER — LEVOTHYROXINE SODIUM 100 MCG PO TABS: ORAL_TABLET | ORAL | Status: DC

## 2012-09-10 NOTE — Progress Notes (Signed)
  Subjective:    Patient ID: [name], female    DOB: 1934-09-29, 78 y.o.   MRN: [id number]  HPI Patient arrives to the office with multiple concerns. Diminished energy. Claims that she is taking all of her medications faithfully. Walking some. Mostly watching her diet. Notes diffuse pruritus. No obvious rash. Also notes more anxious recently. No headache or chest pain. Decent appetite. No symptoms of low or high thyroid. Patient claims compliance with her blood pressure medicine. Review of Systems  Constitutional: Positive for fatigue.  Cardiovascular: Negative for chest pain.  Endocrine: Positive for heat intolerance.  Neurological: Positive for dizziness, tremors and weakness.  All other systems reviewed and are negative.       Objective:   Physical Exam  Vitals reviewed. Constitutional: She is oriented to person, place, and time. She appears well-nourished.  HENT:  Head: Normocephalic and atraumatic.  Eyes: Pupils are equal, round, and reactive to light.  Neck: No thyromegaly present.  Cardiovascular: Normal rate and intact distal pulses.   Pulmonary/Chest: Effort normal. She has no wheezes. She has no rales.  Abdominal: She exhibits no distension.  Musculoskeletal: Normal range of motion.  Neurological: She is alert and oriented to person, place, and time.  Skin: No rash noted.  Psychiatric: She has a normal mood and affect.          Assessment & Plan:  Impression #1 hypertension good control. #2 chronic anxiety ongoing challenge for patient. #3 pruritus likely related to dry skin along with #2 and possibly #4. #4 low thyroid TSH 2 low. Need to adjust medication. Hydroxyzine as needed for itching. Rationale discussed with patient.

## 2012-09-12 DIAGNOSIS — N189 Chronic kidney disease, unspecified: Secondary | ICD-10-CM | POA: Diagnosis not present

## 2012-09-12 DIAGNOSIS — M109 Gout, unspecified: Secondary | ICD-10-CM | POA: Diagnosis not present

## 2012-09-12 DIAGNOSIS — E559 Vitamin D deficiency, unspecified: Secondary | ICD-10-CM | POA: Diagnosis not present

## 2012-09-12 DIAGNOSIS — R809 Proteinuria, unspecified: Secondary | ICD-10-CM | POA: Diagnosis not present

## 2012-09-12 DIAGNOSIS — Z79899 Other long term (current) drug therapy: Secondary | ICD-10-CM | POA: Diagnosis not present

## 2012-09-12 DIAGNOSIS — I10 Essential (primary) hypertension: Secondary | ICD-10-CM | POA: Diagnosis not present

## 2012-09-13 DIAGNOSIS — I719 Aortic aneurysm of unspecified site, without rupture: Secondary | ICD-10-CM | POA: Diagnosis not present

## 2012-09-13 DIAGNOSIS — I10 Essential (primary) hypertension: Secondary | ICD-10-CM | POA: Diagnosis not present

## 2012-09-13 DIAGNOSIS — E782 Mixed hyperlipidemia: Secondary | ICD-10-CM | POA: Diagnosis not present

## 2012-11-01 DIAGNOSIS — E559 Vitamin D deficiency, unspecified: Secondary | ICD-10-CM | POA: Diagnosis not present

## 2012-11-01 DIAGNOSIS — D649 Anemia, unspecified: Secondary | ICD-10-CM | POA: Diagnosis not present

## 2012-11-01 DIAGNOSIS — Z79899 Other long term (current) drug therapy: Secondary | ICD-10-CM | POA: Diagnosis not present

## 2012-11-05 DIAGNOSIS — R809 Proteinuria, unspecified: Secondary | ICD-10-CM | POA: Diagnosis not present

## 2012-11-05 DIAGNOSIS — N189 Chronic kidney disease, unspecified: Secondary | ICD-10-CM | POA: Diagnosis not present

## 2012-11-05 DIAGNOSIS — I10 Essential (primary) hypertension: Secondary | ICD-10-CM | POA: Diagnosis not present

## 2012-11-05 DIAGNOSIS — D649 Anemia, unspecified: Secondary | ICD-10-CM | POA: Diagnosis not present

## 2012-11-05 DIAGNOSIS — M109 Gout, unspecified: Secondary | ICD-10-CM | POA: Diagnosis not present

## 2012-11-07 ENCOUNTER — Ambulatory Visit (INDEPENDENT_AMBULATORY_CARE_PROVIDER_SITE_OTHER): Payer: Medicare Other | Admitting: Family Medicine

## 2012-11-07 ENCOUNTER — Encounter: Payer: Self-pay | Admitting: Family Medicine

## 2012-11-07 VITALS — BP 142/66 | Wt 175.3 lb

## 2012-11-07 DIAGNOSIS — N289 Disorder of kidney and ureter, unspecified: Secondary | ICD-10-CM | POA: Insufficient documentation

## 2012-11-07 DIAGNOSIS — E039 Hypothyroidism, unspecified: Secondary | ICD-10-CM | POA: Diagnosis not present

## 2012-11-07 DIAGNOSIS — N644 Mastodynia: Secondary | ICD-10-CM

## 2012-11-07 DIAGNOSIS — N189 Chronic kidney disease, unspecified: Secondary | ICD-10-CM | POA: Diagnosis not present

## 2012-11-07 DIAGNOSIS — R600 Localized edema: Secondary | ICD-10-CM

## 2012-11-07 DIAGNOSIS — R609 Edema, unspecified: Secondary | ICD-10-CM | POA: Diagnosis not present

## 2012-11-07 MED ORDER — TRAMADOL HCL 50 MG PO TABS: 50.0000 mg | ORAL_TABLET | Freq: Four times a day (QID) | ORAL | Status: DC | PRN

## 2012-11-07 MED ORDER — FUROSEMIDE 20 MG PO TABS: 20.0000 mg | ORAL_TABLET | Freq: Every day | ORAL | Status: DC

## 2012-11-07 MED ORDER — ZOLPIDEM TARTRATE 10 MG PO TABS: 10.0000 mg | ORAL_TABLET | Freq: Every evening | ORAL | Status: DC | PRN

## 2012-11-07 NOTE — Progress Notes (Signed)
  Subjective:    Patient ID: [name], female    DOB: [birth year], 78 y.o.   MRN: [id number]  HPIPatient presents today stating that she is having areas issues. She relates #1 right breast pain has been present over the past couple weeks she denies any injury to it. She states it just started feeling sore she relates it's actually starting to feel better compared to what it was. She denies fevers chills denies blood from the nipple. She had a mammogram in September and it was normal. No history of breast trouble. #2 she's had intermittent foot swelling over the past several days with some soreness both feet worse on the right than the left she denies any shortness of breath with the chest pressure or tightness. She relates no injury. #3 hypothyroidism patient takes her medicine has not had her lab tests at that a while. It should be noted that this patient has a history of hypertension and chronic renal insufficiency. Family history noncontributory social-Patient does have a history of smoking  Review of SystemsShe denies any chest pressure tightness or pain she denies shortness of breath cough fever chills she denies rash     Objective:   Physical Exam Neck no masses are felt lungs are clear no crackles heart is regular pulse normal blood pressure slightly elevated on systolic but diastolic normal abdomen is soft extremities no edema except for the tops of her feet 1+ edema worse on the right than the left no tenderness. Her breast exam is perfectly normal both left and right side.There is no significant tenderness on today's exam.       Assessment & Plan:  #1 breast tenderness-seemingly better today. Patient was told that if she is not having any progression of this and if the symptoms go away nothing more needs to be done other than mammogram in the fall if the tenderness continues to bother her over the next couple weeks she ought to followup for recheck #2 hypothyroidism-I encouraged her  to get her lab tests to continue her medication. #3 pedal edema Lasix 20 mg every morning use this until swelling is gone down then try stopping. If necessary use intermittently if breathing difficulties chest discomfort or other problems followup immediately.

## 2012-11-15 ENCOUNTER — Other Ambulatory Visit: Payer: Self-pay | Admitting: Family Medicine

## 2012-11-29 ENCOUNTER — Other Ambulatory Visit: Payer: Self-pay | Admitting: Family Medicine

## 2012-12-02 ENCOUNTER — Ambulatory Visit (INDEPENDENT_AMBULATORY_CARE_PROVIDER_SITE_OTHER): Payer: Medicare Other | Admitting: Family Medicine

## 2012-12-02 ENCOUNTER — Encounter: Payer: Self-pay | Admitting: Family Medicine

## 2012-12-02 VITALS — BP 152/78 | Temp 99.2°F | Ht 67.0 in | Wt 172.5 lb

## 2012-12-02 DIAGNOSIS — K219 Gastro-esophageal reflux disease without esophagitis: Secondary | ICD-10-CM | POA: Diagnosis not present

## 2012-12-02 DIAGNOSIS — F411 Generalized anxiety disorder: Secondary | ICD-10-CM | POA: Diagnosis not present

## 2012-12-02 DIAGNOSIS — G4700 Insomnia, unspecified: Secondary | ICD-10-CM

## 2012-12-02 DIAGNOSIS — E785 Hyperlipidemia, unspecified: Secondary | ICD-10-CM | POA: Diagnosis not present

## 2012-12-02 MED ORDER — FUROSEMIDE 20 MG PO TABS: 20.0000 mg | ORAL_TABLET | Freq: Every day | ORAL | Status: DC

## 2012-12-02 MED ORDER — HYDROXYZINE HCL 25 MG PO TABS: 25.0000 mg | ORAL_TABLET | Freq: Three times a day (TID) | ORAL | Status: DC | PRN

## 2012-12-10 DIAGNOSIS — F411 Generalized anxiety disorder: Secondary | ICD-10-CM | POA: Insufficient documentation

## 2012-12-10 DIAGNOSIS — G4700 Insomnia, unspecified: Secondary | ICD-10-CM | POA: Insufficient documentation

## 2012-12-10 DIAGNOSIS — K219 Gastro-esophageal reflux disease without esophagitis: Secondary | ICD-10-CM | POA: Insufficient documentation

## 2012-12-10 NOTE — Progress Notes (Signed)
  Subjective:    Patient ID: [name], female    DOB: 05-17-1935, 78 y.o.   MRN: [id number]  HPI Patient arrives office with multiple concerns. She claims compliance with her blood pressure medicine. Not able to exercise much these days.  Patient notes ongoing pruritus. Occasional slight rash. Stated the hydroxyzine helped considerably in the past.   Patient continued had to have swelling in her ankles. Worse after on her feet all day long. Claims compliance with medication.  Patient reports she absolutely needs her reflux medicine. Without it should developed some significant symptoms with that.  Review of Systems No headache no chest pain decent appetite no weight gain no weight loss. Ongoing anxiety. Review systems otherwise negative.    Objective:   Physical Exam  Alert no acute distress vitals stable. Lungs clear. Heart regular in rhythm. H&T normal. Feet trace edema.      Assessment & Plan:  Impression #1 venous stasis discussed. Recommend no strong her diuretic when she currently is on. #2 hypertension good control. #3 fatigue ongoing. #4 reflux stable on meds. #5 nonspecific pruritus with intermittent rash. Hydroxyzine helped. Plan diet exercise discussed. Maintain same meds. Hydroxyzine refilled. Recheck in several months. WSL

## 2012-12-13 ENCOUNTER — Other Ambulatory Visit: Payer: Self-pay | Admitting: Family Medicine

## 2012-12-18 ENCOUNTER — Other Ambulatory Visit: Payer: Self-pay | Admitting: Family Medicine

## 2012-12-31 ENCOUNTER — Other Ambulatory Visit: Payer: Self-pay | Admitting: Family Medicine

## 2012-12-31 NOTE — Telephone Encounter (Signed)
RX called in .

## 2012-12-31 NOTE — Telephone Encounter (Signed)
Ok plus 5 monthly ref 

## 2012-12-31 NOTE — Telephone Encounter (Signed)
Last seen 12/02/12.

## 2013-01-09 DIAGNOSIS — D649 Anemia, unspecified: Secondary | ICD-10-CM | POA: Diagnosis not present

## 2013-01-09 DIAGNOSIS — I10 Essential (primary) hypertension: Secondary | ICD-10-CM | POA: Diagnosis not present

## 2013-01-09 DIAGNOSIS — Z79899 Other long term (current) drug therapy: Secondary | ICD-10-CM | POA: Diagnosis not present

## 2013-01-09 DIAGNOSIS — E559 Vitamin D deficiency, unspecified: Secondary | ICD-10-CM | POA: Diagnosis not present

## 2013-01-09 DIAGNOSIS — N189 Chronic kidney disease, unspecified: Secondary | ICD-10-CM | POA: Diagnosis not present

## 2013-01-14 DIAGNOSIS — I10 Essential (primary) hypertension: Secondary | ICD-10-CM | POA: Diagnosis not present

## 2013-01-14 DIAGNOSIS — N184 Chronic kidney disease, stage 4 (severe): Secondary | ICD-10-CM | POA: Diagnosis not present

## 2013-01-14 DIAGNOSIS — D509 Iron deficiency anemia, unspecified: Secondary | ICD-10-CM | POA: Diagnosis not present

## 2013-01-14 DIAGNOSIS — E559 Vitamin D deficiency, unspecified: Secondary | ICD-10-CM | POA: Diagnosis not present

## 2013-01-27 ENCOUNTER — Encounter (HOSPITAL_COMMUNITY)
Admission: RE | Admit: 2013-01-27 | Discharge: 2013-01-27 | Disposition: A | Discharge status: Home / Self Care | Payer: Medicare Other | Source: Ambulatory Visit | Attending: Nephrology | Admitting: Nephrology

## 2013-01-27 DIAGNOSIS — D649 Anemia, unspecified: Secondary | ICD-10-CM | POA: Insufficient documentation

## 2013-01-27 MED ORDER — SODIUM CHLORIDE 0.9 % IJ SOLN
10.0000 mL | Freq: Once | INTRAMUSCULAR | Status: AC
  Administered 2013-01-27: 10 mL via INTRAVENOUS

## 2013-01-27 MED ORDER — SODIUM CHLORIDE 0.9 % IV BOLUS (SEPSIS)
50.0000 mL | Freq: Once | INTRAVENOUS | Status: AC
  Administered 2013-01-27: 50 mL via INTRAVENOUS

## 2013-01-27 MED ORDER — FERUMOXYTOL INJECTION 510 MG/17 ML
510.0000 mg | Freq: Once | INTRAVENOUS | Status: AC
  Administered 2013-01-27: 510 mg via INTRAVENOUS
  Filled 2013-01-27: qty 17

## 2013-01-27 NOTE — Progress Notes (Addendum)
Arrived to Short Stay ambulatory for Feraheme infusion per order Dr Fausto Skillern for anemia. Infusion complete at 0955. D/C to car ambulatory with brother in satisfactory condition.

## 2013-01-30 ENCOUNTER — Encounter (INDEPENDENT_AMBULATORY_CARE_PROVIDER_SITE_OTHER): Payer: Self-pay | Admitting: *Deleted

## 2013-02-03 ENCOUNTER — Encounter (HOSPITAL_COMMUNITY)
Admission: RE | Admit: 2013-02-03 | Discharge: 2013-02-03 | Disposition: A | Discharge status: Home / Self Care | Payer: Medicare Other | Source: Ambulatory Visit | Attending: Nephrology | Admitting: Nephrology

## 2013-02-03 DIAGNOSIS — D649 Anemia, unspecified: Secondary | ICD-10-CM | POA: Diagnosis not present

## 2013-02-03 MED ORDER — SODIUM CHLORIDE 0.9 % IV SOLN
INTRAVENOUS | Status: DC
  Administered 2013-02-03: 09:00:00 via INTRAVENOUS

## 2013-02-03 MED ORDER — FERUMOXYTOL INJECTION 510 MG/17 ML
510.0000 mg | Freq: Once | INTRAVENOUS | Status: AC
  Administered 2013-02-03: 510 mg via INTRAVENOUS
  Filled 2013-02-03: qty 17

## 2013-02-03 NOTE — Progress Notes (Signed)
Pt received to SS for Feraheme infusion.  VSS, infusion completed without incident.  Pt tol well.

## 2013-02-11 ENCOUNTER — Encounter: Payer: Self-pay | Admitting: Family Medicine

## 2013-02-11 ENCOUNTER — Ambulatory Visit (INDEPENDENT_AMBULATORY_CARE_PROVIDER_SITE_OTHER): Payer: Medicare Other | Admitting: Family Medicine

## 2013-02-11 VITALS — BP 160/74 | Ht 67.0 in | Wt 171.8 lb

## 2013-02-11 DIAGNOSIS — K219 Gastro-esophageal reflux disease without esophagitis: Secondary | ICD-10-CM

## 2013-02-11 DIAGNOSIS — G4700 Insomnia, unspecified: Secondary | ICD-10-CM

## 2013-02-11 DIAGNOSIS — E039 Hypothyroidism, unspecified: Secondary | ICD-10-CM | POA: Diagnosis not present

## 2013-02-11 DIAGNOSIS — I10 Essential (primary) hypertension: Secondary | ICD-10-CM | POA: Diagnosis not present

## 2013-02-11 DIAGNOSIS — N189 Chronic kidney disease, unspecified: Secondary | ICD-10-CM | POA: Diagnosis not present

## 2013-02-11 DIAGNOSIS — Z79899 Other long term (current) drug therapy: Secondary | ICD-10-CM

## 2013-02-11 DIAGNOSIS — F411 Generalized anxiety disorder: Secondary | ICD-10-CM

## 2013-02-11 DIAGNOSIS — E785 Hyperlipidemia, unspecified: Secondary | ICD-10-CM

## 2013-02-11 MED ORDER — ONDANSETRON 4 MG PO TBDP: 4.0000 mg | ORAL_TABLET | Freq: Three times a day (TID) | ORAL | Status: DC | PRN

## 2013-02-11 MED ORDER — ZOLPIDEM TARTRATE 10 MG PO TABS: 10.0000 mg | ORAL_TABLET | Freq: Every evening | ORAL | Status: AC | PRN

## 2013-02-11 NOTE — Progress Notes (Signed)
  Subjective:    Patient ID: Harlen Labs, female    DOB: [birth year], 78 y.o.   MRN: [id number]  HPI Not watching diet,  Nerves not as shaky, but still feel anxious Considerable challenges with insomnia needs to take something for it  bp up tod sticking with meds. Trying to watch her salt intake. Unfortunately still smokes from  Now on new dose of thyroid. Compliant with it. Tries not to medicine does. Does feel considerable fatigue, however this is been a chronic problem for many years.  Trying to watch her lipids claims compliance with diet not exercising most has cut down on fatty foods.  Nausea intermittently comes and goes   Review of Systems No chest pain no vomiting no abdominal pain no change in bowel habits ROS otherwise negative    Objective:   Physical Exam Alert no acute distress. HEENT normal. Lungs clear. Heart regular rate rhythm. Somewhat tremulous. Abdomen benign.       Assessment & Plan:  Impression 1 hyperlipidemia status uncertain her 2 hypothyroidism status uncertain. #3 chronic anxiety. #4 fatigue discuss. #5 chronic smoker. #6 hypertension plan diet exercise discussed meds prescribed. And renewed. Appropriate blood work. Encouraged to stop smoking. Recheck in several months. WSL

## 2013-02-21 ENCOUNTER — Encounter: Payer: Self-pay | Admitting: *Deleted

## 2013-02-27 ENCOUNTER — Encounter: Payer: Self-pay | Admitting: Cardiovascular Disease

## 2013-02-28 ENCOUNTER — Encounter: Payer: Self-pay | Admitting: Cardiovascular Disease

## 2013-02-28 ENCOUNTER — Ambulatory Visit (INDEPENDENT_AMBULATORY_CARE_PROVIDER_SITE_OTHER): Payer: Medicare Other | Admitting: Cardiovascular Disease

## 2013-02-28 VITALS — BP 130/72 | HR 61 | Ht 67.75 in | Wt 170.3 lb

## 2013-02-28 DIAGNOSIS — E785 Hyperlipidemia, unspecified: Secondary | ICD-10-CM | POA: Insufficient documentation

## 2013-02-28 DIAGNOSIS — I739 Peripheral vascular disease, unspecified: Secondary | ICD-10-CM

## 2013-02-28 DIAGNOSIS — I10 Essential (primary) hypertension: Secondary | ICD-10-CM | POA: Diagnosis not present

## 2013-02-28 DIAGNOSIS — I2510 Atherosclerotic heart disease of native coronary artery without angina pectoris: Secondary | ICD-10-CM | POA: Diagnosis not present

## 2013-02-28 LAB — LIPID PANEL: LDL Cholesterol: 64 mg/dL (ref 0–99)

## 2013-02-28 NOTE — Assessment & Plan Note (Signed)
On statin and fibrin therapy. Her last lipid liver profile was 02/09/12 revealing a total cholesterol of 156, LDL 75 and HDL of 51

## 2013-02-28 NOTE — Patient Instructions (Addendum)
Your physician recommends that you return for lab work in the next week. You will need to be fasting for this lab work.   Your physician wants you to follow-up in 6 months with an extender and 12 months with Dr. Allyson Sabal.  You will receive a reminder letter in the mail two months in advance. If you don't receive a letter, please call our office to schedule the follow-up appointment.

## 2013-02-28 NOTE — Assessment & Plan Note (Signed)
Status post cardiac catheterization performed by myself 03/25/10 revealing a left dominant system with 30-50% proximal and mid LAD and first obtuse marginal branch with normal LV function. It should be noted that her abdominal aortic tube graft was intact at that time.

## 2013-02-28 NOTE — Assessment & Plan Note (Signed)
Status post infrarenal abdominal aortic tube graft placed by Dr. Gale Journey  February 2008

## 2013-02-28 NOTE — Progress Notes (Signed)
02/28/2013 Harlen Labs   [birth year]  [id number]  Primary Physician Harlow Asa, MD Primary Cardiologist: Runell Gess MD Roseanne Reno   HPI:  The patient is a 78 year old mildly overweight widowed [place] American female, mother of 2 and grandmother to 1 grandchild, whom I last saw in the office 7 months ago. Her risk factors include continued tobacco abuse of approximately 1/2 pack per day, having smoked 25-50 pack years, recalcitrant to risk-factor modification. She has treated hypertension, hyperlipidemia, and a positive family history for heart disease with a mother who died of a myocardial infarction. She had an abdominal aortic tube graft placement by Dr. Jerilee Field February 2008. I catheterized her March 25, 2010, revealing moderate CAD in a left-dominant system with normal LV function. Her tube graft was intact and her bifurcation was tortuous. She is totally asymptomatic. Her lipid profile is excellent for secondary prevention, checked February 09, 2012, with a total cholesterol of 156, LDL of 75, and HDL of 51.since I saw her back in the office 09/13/12 she's remained completely asymptomatic except for some mild lotion edema and some "dizziness". She does get shortness of breath on occasions but denies chest pain.      Allergies  Allergen Reactions  . Aleve [Naproxen Sodium] Other (See Comments)    GI bleed  . Dilaudid [Hydromorphone Hcl]     Can not tolerate  . Dyazide [Hydrochlorothiazide W-Triamterene] Other (See Comments)    cramps  . Zithromax [Azithromycin] Itching    History   Social History  . Marital Status: Divorced    Spouse Name: N/A    Number of Children: 2  . Years of Education: N/A   Occupational History  .     Social History Main Topics  . Smoking status: Current Every Day Smoker -- 0.50 packs/day for 59 years    Types: Cigarettes  . Smokeless tobacco: Never Used     Comment: smoking since age [age]  . Alcohol Use: No  . Drug Use:  No  . Sexual Activity: Not on file   Other Topics Concern  . Not on file   Social History Narrative  . No narrative on file     Review of Systems: General: negative for chills, fever, night sweats or weight changes.  Cardiovascular: negative for chest pain, dyspnea on exertion, edema, orthopnea, palpitations, paroxysmal nocturnal dyspnea or shortness of breath Dermatological: negative for rash Respiratory: negative for cough or wheezing Urologic: negative for hematuria Abdominal: negative for nausea, vomiting, diarrhea, bright red blood per rectum, melena, or hematemesis Neurologic: negative for visual changes, syncope, or dizziness All other systems reviewed and are otherwise negative except as noted above.    Blood pressure 130/72, pulse 61, height 5' 7.75" (1.721 m), weight 170 lb 4.8 oz (77.248 kg).  General appearance: alert and no distress Neck: no adenopathy, no carotid bruit, no JVD, supple, symmetrical, trachea midline and thyroid not enlarged, symmetric, no tenderness/mass/nodules Lungs: clear to auscultation bilaterally Heart: regular rate and rhythm, S1, S2 normal, no murmur, click, rub or gallop Extremities: extremities normal, atraumatic, no cyanosis or edema  EKG normal sinus rhythm at 61 without ST or T wave changes  ASSESSMENT AND PLAN:   Peripheral arterial disease Status post infrarenal abdominal aortic tube graft placed by Dr. Gale Journey  February 2008  Hyperlipidemia On statin and fibrin therapy. Her last lipid liver profile was 02/09/12 revealing a total cholesterol of 156, LDL 75 and HDL of 51  Essential hypertension Well-controlled on current  medications  Coronary artery disease Status post cardiac catheterization performed by myself 03/25/10 revealing a left dominant system with 30-50% proximal and mid LAD and first obtuse marginal branch with normal LV function. It should be noted that her abdominal aortic tube graft was intact at that  time.      Runell Gess MD FACP,FACC,FAHA, [facility] 02/28/2013 9:33 AM

## 2013-02-28 NOTE — Assessment & Plan Note (Signed)
Well-controlled on current medications 

## 2013-03-03 ENCOUNTER — Telehealth (INDEPENDENT_AMBULATORY_CARE_PROVIDER_SITE_OTHER): Payer: Self-pay | Admitting: *Deleted

## 2013-03-03 ENCOUNTER — Encounter (INDEPENDENT_AMBULATORY_CARE_PROVIDER_SITE_OTHER): Payer: Self-pay | Admitting: Internal Medicine

## 2013-03-03 ENCOUNTER — Ambulatory Visit (INDEPENDENT_AMBULATORY_CARE_PROVIDER_SITE_OTHER): Payer: Medicare Other | Admitting: Internal Medicine

## 2013-03-03 ENCOUNTER — Other Ambulatory Visit (INDEPENDENT_AMBULATORY_CARE_PROVIDER_SITE_OTHER): Payer: Self-pay | Admitting: *Deleted

## 2013-03-03 VITALS — BP 140/70 | HR 72 | Temp 99.0°F | Ht 67.0 in | Wt 173.9 lb

## 2013-03-03 DIAGNOSIS — D649 Anemia, unspecified: Secondary | ICD-10-CM

## 2013-03-03 DIAGNOSIS — D509 Iron deficiency anemia, unspecified: Secondary | ICD-10-CM | POA: Diagnosis not present

## 2013-03-03 DIAGNOSIS — Z1211 Encounter for screening for malignant neoplasm of colon: Secondary | ICD-10-CM

## 2013-03-03 NOTE — Patient Instructions (Addendum)
Will schedule and EGD/colonoscopy with Dr Karilyn Cota

## 2013-03-03 NOTE — Telephone Encounter (Signed)
Patient needs movi prep 

## 2013-03-03 NOTE — Progress Notes (Addendum)
Subjective:     Patient ID: [name], female   DOB: 1934-08-29, 78 y.o.   MRN: [id number]  HPI Referred to our of office by Dr. Fausto Skillern for anemia, and screening colonoscopy. She denies prior hx of anemia.  Two years ago her hemoglobin was normal. It appears to have gradually decreased. She tells me she stays cold and tired "all the time".  She craves ice. She see Dr; Fausto Skillern for Chronic Kidney Disease stage 4.  Appetite is not good.  There is no weight loss.  NO abdominal pain. She does c/o lower back pain. She has some nausea and takes Zofran for this. She received 2 iron infusions last month at [facility]  She has diarrhea "all the time". Stools are brown in color. No melena or bright red rectal bleeding.  She has 4-5 stools a day since she had partial colectomy in 2005 for diverticulitis.  She denies acid reflux and is on Nexium. 2005 Partial colectomy by Dr. Lovell Sheehan for hx of diverticulitis.  3 stools cards at Dr. Carlynn Spry office were negative for blood.  She has had a colonoscopy in the past by Dr. Karilyn Cota. I will try to locate that record.  12/31/3012 H and H  8.5 and 27.4. IRON binding Cap 522, UIBC 483, Iron 39, Ferritin 13.   11/06/2012 Iron 35, TIBC 412, UIBC 447, %sat 8, Ferritin 8.   Review of Systems see hpi  Past Medical History  Diagnosis Date  . Hypertension   . Hypothyroidism   . Hyperlipidemia   . Insomnia   . GERD (gastroesophageal reflux disease)     chronic gastritis  . Adrenal adenoma   . Chronic diarrhea   . Fatty liver   . Arthritis     knee  . QT prolongation     syncope  . Coronary artery disease   . Impaired fasting glucose   . Renal insufficiency     low protien diet  . Tobacco use     1/2 ppd, approx 25-50 pack years (as of 08/2012)  . Family history of heart disease   . IBS (irritable bowel syndrome)   . Peripheral arterial disease     sstatus post  infrarenal abdominal aortic tube graft placed by Dr. Betti Cruz February 2008   Past  Surgical History  Procedure Laterality Date  . Abdominal hysterectomy    . Thyroidectomy, partial    . Colonoscopy  5/04    normal  . Abdominal aortic aneurysm repair  07/2006    [facility] [place]  . Cataract extraction Bilateral 2010  . Cardiac catheterization  02/2010    mod CAD in L-dominant system with normal LV function  . Colon resection  2005    1/2 colon removed  . Transthoracic echocardiogram  02/2010    EF 55-60%; mild conc LVH, normal systolic function; mildly calcified AV annulus   Allergies  Allergen Reactions  . Aleve [Naproxen Sodium] Other (See Comments)    GI bleed  . Dilaudid [Hydromorphone Hcl]     Can not tolerate  . Dyazide [Hydrochlorothiazide W-Triamterene] Other (See Comments)    cramps  . Zithromax [Azithromycin] Itching   Current Outpatient Prescriptions on File Prior to Visit  Medication Sig Dispense Refill  . allopurinol (ZYLOPRIM) 300 MG tablet take 1 tablet by mouth once daily  30 tablet  3  . ALPRAZolam (XANAX) 0.5 MG tablet Take 0.5 mg by mouth. One tab qid as needed      . aspirin 81 MG  tablet Take 81 mg by mouth daily.      . Cholecalciferol 1000 UNITS capsule Take 1,000 Units by mouth daily.      . enalapril (VASOTEC) 20 MG tablet take 1 tablet by mouth once daily  30 tablet  5  . fenofibrate 160 MG tablet take 1 tablet by mouth once daily with food  30 tablet  3  . furosemide (LASIX) 20 MG tablet Take 1 tablet (20 mg total) by mouth daily.  30 tablet  11  . hydrALAZINE (APRESOLINE) 25 MG tablet take 1 tablet by mouth three times a day  90 tablet  6  . hydrOXYzine (ATARAX/VISTARIL) 25 MG tablet Take 1 tablet (25 mg total) by mouth 3 (three) times daily as needed for itching.  60 tablet  5  . levothyroxine (SYNTHROID, LEVOTHROID) 112 MCG tablet Take 112 mcg by mouth daily before breakfast.      . loperamide (IMODIUM) 2 MG capsule Take 2 mg by mouth 4 (four) times daily as needed for diarrhea or loose stools.      . magnesium oxide (MAG-OX) 400 MG  tablet Take 400 mg by mouth daily.       Marland Kitchen NEXIUM 40 MG capsule take 1 capsule by mouth once daily  30 capsule  5  . ondansetron (ZOFRAN ODT) 4 MG disintegrating tablet Take 1 tablet (4 mg total) by mouth every 8 (eight) hours as needed for nausea.  30 tablet  2  . PARoxetine (PAXIL) 30 MG tablet take 1 tablet by mouth once daily  30 tablet  3  . pravastatin (PRAVACHOL) 20 MG tablet take 1 tablet by mouth once daily  30 tablet  6  . traMADol (ULTRAM) 50 MG tablet Take 1 tablet (50 mg total) by mouth every 6 (six) hours as needed for pain.  30 tablet  2  . zolpidem (AMBIEN) 10 MG tablet Take 1 tablet (10 mg total) by mouth at bedtime as needed for sleep.  90 tablet  3   No current facility-administered medications on file prior to visit.       Family: She is divorced. She is retired from [employer]. She has two children in good health.     Objective:   Physical Exam  Filed Vitals:   03/03/13 0953  BP: 140/70  Pulse: 72  Temp: 99 F (37.2 C)  Height: 5\' 7"  (1.702 m)  Weight: 173 lb 14.4 oz (78.881 kg)  Alert and oriented. Skin warm and dry. Oral mucosa is moist.   . Sclera anicteric, conjunctivae is pink. Thyroid not enlarged. No cervical lymphadenopathy. Lungs clear. Heart regular rate and rhythm.  Abdomen is soft. Bowel sounds are positive. No hepatomegaly. No abdominal masses felt. No tenderness.  No edema to lower extremities.  Stool brown and guaiac negative.      Assessment:     Iron deficiency Anemia. Colonic neoplasm, AVM, ulcer, polyp needs to be ruled out. I discussed this case with Dr. Karilyn Cota.    Plan:    Colonoscopy/EGD.

## 2013-03-04 MED ORDER — PEG-KCL-NACL-NASULF-NA ASC-C 100 G PO SOLR: 1.0000 | Freq: Once | ORAL | Status: DC

## 2013-03-05 ENCOUNTER — Ambulatory Visit: Payer: Medicare Other | Admitting: Cardiovascular Disease

## 2013-03-06 ENCOUNTER — Telehealth (INDEPENDENT_AMBULATORY_CARE_PROVIDER_SITE_OTHER): Payer: Self-pay | Admitting: Internal Medicine

## 2013-03-06 ENCOUNTER — Encounter (HOSPITAL_COMMUNITY): Payer: Self-pay | Admitting: Pharmacy Technician

## 2013-03-06 DIAGNOSIS — Z23 Encounter for immunization: Secondary | ICD-10-CM | POA: Diagnosis not present

## 2013-03-06 NOTE — Telephone Encounter (Signed)
2005 Colonoscopy by Dr. Jena Gauss:  Internal hemorrhoids. Otherwise normal rectum. Scattered erosions and ulcerations (small) throughout the transverse colon, limited to the hepatic flexure, proximally. Some tortuosity of the left colon, but the mucosa otherwise appeared normal. Unable to identify a single diverticulum. Today's finding are interesting. This Lady presented with a perforation and significant hematochezia. The hematochezia is not commonly associated with diverticulitis.  I have to wonder if this lady did not suffer a bout of significant ischemic colitis with through and through perforation. Proceed with CT angiogram to look at her mesenteric vasculature further. Patient denies taking any nonsteroidal's recently. (These are per Dr. Luvenia Starch notes).  Biopsy: Polyp (Hepatic flexure, polypectomy). Chronic active colitis with focal regenerative change. Polyp (transverse colon, polypectomy): Chronic active colitis with evidence of ulceration.

## 2013-03-19 ENCOUNTER — Ambulatory Visit (HOSPITAL_COMMUNITY)
Admission: RE | Admit: 2013-03-19 | Discharge: 2013-03-19 | Disposition: A | Discharge status: Home / Self Care | Payer: Medicare Other | Source: Ambulatory Visit | Attending: Internal Medicine | Admitting: Internal Medicine

## 2013-03-19 ENCOUNTER — Encounter (HOSPITAL_COMMUNITY)
Admission: RE | Disposition: A | Discharge status: Home / Self Care | Payer: Self-pay | Source: Ambulatory Visit | Attending: Internal Medicine

## 2013-03-19 ENCOUNTER — Other Ambulatory Visit: Payer: Self-pay | Admitting: Family Medicine

## 2013-03-19 ENCOUNTER — Encounter (HOSPITAL_COMMUNITY): Payer: Self-pay | Admitting: *Deleted

## 2013-03-19 DIAGNOSIS — R197 Diarrhea, unspecified: Secondary | ICD-10-CM | POA: Diagnosis not present

## 2013-03-19 DIAGNOSIS — K2980 Duodenitis without bleeding: Secondary | ICD-10-CM | POA: Diagnosis not present

## 2013-03-19 DIAGNOSIS — D649 Anemia, unspecified: Secondary | ICD-10-CM

## 2013-03-19 DIAGNOSIS — Z9049 Acquired absence of other specified parts of digestive tract: Secondary | ICD-10-CM | POA: Insufficient documentation

## 2013-03-19 DIAGNOSIS — D509 Iron deficiency anemia, unspecified: Secondary | ICD-10-CM | POA: Insufficient documentation

## 2013-03-19 DIAGNOSIS — D126 Benign neoplasm of colon, unspecified: Secondary | ICD-10-CM | POA: Insufficient documentation

## 2013-03-19 DIAGNOSIS — K31819 Angiodysplasia of stomach and duodenum without bleeding: Secondary | ICD-10-CM | POA: Diagnosis not present

## 2013-03-19 DIAGNOSIS — K2960 Other gastritis without bleeding: Secondary | ICD-10-CM

## 2013-03-19 DIAGNOSIS — I10 Essential (primary) hypertension: Secondary | ICD-10-CM | POA: Diagnosis not present

## 2013-03-19 HISTORY — PX: COLONOSCOPY WITH ESOPHAGOGASTRODUODENOSCOPY (EGD): SHX5779

## 2013-03-19 LAB — HEMOGLOBIN AND HEMATOCRIT, BLOOD
HCT: 35 % — ABNORMAL LOW (ref 36.0–46.0)
Hemoglobin: 11.3 g/dL — ABNORMAL LOW (ref 12.0–15.0)

## 2013-03-19 SURGERY — COLONOSCOPY WITH ESOPHAGOGASTRODUODENOSCOPY (EGD)
Anesthesia: Moderate Sedation

## 2013-03-19 MED ORDER — MEPERIDINE HCL 50 MG/ML IJ SOLN
INTRAMUSCULAR | Status: DC | PRN
  Administered 2013-03-19 (×2): 25 mg via INTRAVENOUS

## 2013-03-19 MED ORDER — MEPERIDINE HCL 50 MG/ML IJ SOLN
INTRAMUSCULAR | Status: AC
  Filled 2013-03-19: qty 1

## 2013-03-19 MED ORDER — MIDAZOLAM HCL 5 MG/5ML IJ SOLN
INTRAMUSCULAR | Status: DC | PRN
  Administered 2013-03-19: 1 mg via INTRAVENOUS
  Administered 2013-03-19 (×4): 2 mg via INTRAVENOUS
  Administered 2013-03-19: 1 mg via INTRAVENOUS

## 2013-03-19 MED ORDER — MIDAZOLAM HCL 5 MG/5ML IJ SOLN
INTRAMUSCULAR | Status: AC
  Filled 2013-03-19: qty 10

## 2013-03-19 MED ORDER — SODIUM CHLORIDE 0.9 % IV SOLN
INTRAVENOUS | Status: DC
  Administered 2013-03-19: 13:00:00 via INTRAVENOUS

## 2013-03-19 MED ORDER — FERROUS SULFATE 325 (65 FE) MG PO TABS: 325.0000 mg | ORAL_TABLET | Freq: Two times a day (BID) | ORAL | Status: AC

## 2013-03-19 MED ORDER — BUTAMBEN-TETRACAINE-BENZOCAINE 2-2-14 % EX AERO
INHALATION_SPRAY | CUTANEOUS | Status: DC | PRN
  Administered 2013-03-19: 2 via TOPICAL

## 2013-03-19 NOTE — Op Note (Addendum)
EGD AND FLEXIBLE SIGMOIDOSCOPY PROCEDURE REPORT  PATIENT:  [name]  MR#:  [id number] Birthdate:  1935/06/03, 78 y.o., female Endoscopist:  Dr. Malissa Hippo, MD Referred By:  Andrew Au, MD Procedure Date: 03/19/2013  Procedure:   EGD & flexible sigmoidoscopy.  Indications:  Patient is 78 year old African female who presents with iron deficiency anemia. She has chronic diarrhea since she had subtotal colectomy in 2005. However she denies melena or rectal bleeding hematuria or vaginal bleeding. Her stool guaiac was negative in the office. She is undergoing diagnostic EGD and flexible sigmoidoscopy.          Informed Consent:  The risks, benefits, alternatives & imponderables which include, but are not limited to, bleeding, infection, perforation, drug reaction and potential missed lesion have been reviewed.  The potential for biopsy, lesion removal, esophageal dilation, etc. have also been discussed.  Questions have been answered.  All parties agreeable.  Please see history & physical in medical record for more information.  Medications:  Demerol 50  IV Versed 10 IV Cetacaine spray topically for oropharyngeal anesthesia  EGD  Description of procedure:  The endoscope was introduced through the mouth and advanced to the second portion of the duodenum without difficulty or limitations. The mucosal surfaces were surveyed very carefully during advancement of the scope and upon withdrawal.  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 GE junction was unremarkable. GEJ:  43 cm Stomach:  Stomach was empty and distended very well with insufflation. Folds in the proximal stomach we normal. Examination mucosa revealed large scar in the antrum along the posterior wall. Multiple anterior erosions are also present. Pyloric channel was patent. Angularis fundus and cardia were examined by  retroflexing the scope and these areas were normal. Duodenum:  Bulbar mucosa was normal. There was small raised area angle of the duodenum possibly Brunner's gland hypertrophy. Post bulbar mucosa was normal.  Therapeutic/Diagnostic Maneuvers Performed:  And him biopsies were taken from post bulbar mucosa as well as from angle of duodenum and submitted together.  FLEXIBLE SIGMOIDOSCOPY Description of procedure:  After a digital rectal exam was performed, that colonoscope was advanced from the anus through the rectum and colon to the area he'll colonic anastomosis which was located at 50 cm from the anal margin. Short segment of distal small bowel was also examined. As the scope was withdrawn findings were noted. While in the rectum scope was retroflexed. Findings:   Patent ileocolonic anastomosis located at 50 cm from the anal margin. Small polyps ablated via cold biopsy from area distal to anastomosis. Normal rectal mucosa and anal rectal junction.  Therapeutic/Diagnostic Maneuvers Performed:  See above.  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 Antral scar along with multiple anterior erosions. Random biopsies taken from duodenal mucosa. Flexible sigmoidoscopy findings; Normal distal small bowel and wide open ileocolonic anastomosis. Small polyp ablated while cold biopsy from sigmoid colon distal to anastomosis.  Recommendations:  Will check H&H today. H. Pylori serology. Patient can continue low-dose aspirin but should not take other NSAIDs. Ferrous sulfate 325 mg by mouth twice a day. I will be contacting patient with results of biopsy and blood test.   REHMAN,NAJEEB U  03/19/2013 2:49 PM  CC: Dr. Harlow Asa, MD & Dr. Bonnetta Barry ref. provider found         Dr. Memory Argue, MD

## 2013-03-19 NOTE — H&P (Addendum)
[name] is an 78 y.o. female.   Chief Complaint: Patient is  here for EGD and colonoscopy. HPI: Patient is 58 rolled African female was recently diagnosed to have iron deficiency anemia. She denies nausea vomiting hematemesis melena or rectal bleeding. She also denies hematuria or vaginal bleeding. She's had diarrhea since partial colectomy back in 2005. He does not take NSAIDs other than low-dose aspirin. She does not have good appetite. She has lost about 10 pounds this year. When she is seen in the office 2 weeks ago her stool was noted to be quite negative.  Past Medical History  Diagnosis Date  . Hypertension   . Hypothyroidism   . Hyperlipidemia   . Insomnia   . GERD (gastroesophageal reflux disease)     chronic gastritis  . Adrenal adenoma   . Chronic diarrhea   . Fatty liver   . Arthritis     knee  . QT prolongation     syncope  . Coronary artery disease   . Impaired fasting glucose   . Renal insufficiency     low protien diet  . Tobacco use     1/2 ppd, approx 25-50 pack years (as of 08/2012)  . Family history of heart disease   . IBS (irritable bowel syndrome)   . Peripheral arterial disease     sstatus post  infrarenal abdominal aortic tube graft placed by Dr. Betti Cruz February 2008    Past Surgical History  Procedure Laterality Date  . Abdominal hysterectomy    . Thyroidectomy, partial    . Colonoscopy  5/04    normal  . Abdominal aortic aneurysm repair  07/2006    [facility] [place]  . Cataract extraction Bilateral 2010  . Cardiac catheterization  02/2010    mod CAD in L-dominant system with normal LV function  . Colon resection  2005    1/2 colon removed  . Transthoracic echocardiogram  02/2010    EF 55-60%; mild conc LVH, normal systolic function; mildly calcified AV annulus    Family History  Problem Relation Age of Onset  . Hypertension Mother   . Diabetes Mother   . Heart attack Mother   . Hyperlipidemia Sister     x3 sister  . Kidney disease  Brother    Social History:  reports that she has been smoking Cigarettes.  She has a 29.5 pack-year smoking history. She has never used smokeless tobacco. She reports that she does not drink alcohol or use illicit drugs.  Allergies:  Allergies  Allergen Reactions  . Aleve [Naproxen Sodium] Other (See Comments)    GI bleed  . Dilaudid [Hydromorphone Hcl]     Can not tolerate  . Dyazide [Hydrochlorothiazide W-Triamterene] Other (See Comments)    cramps  . Zithromax [Azithromycin] Itching    Medications Prior to Admission  Medication Sig Dispense Refill  . allopurinol (ZYLOPRIM) 300 MG tablet TAKE ONE TABLET BY MOUTH ONCE DAILY  30 tablet  5  . ALPRAZolam (XANAX) 0.5 MG tablet Take 0.5 mg by mouth. One tab qid as needed      . amLODipine (NORVASC) 10 MG tablet Take 10 mg by mouth daily.       Marland Kitchen aspirin 81 MG tablet Take 81 mg by mouth daily.      . Cholecalciferol 1000 UNITS capsule Take 1,000 Units by mouth daily.      . enalapril (VASOTEC) 20 MG tablet take 1 tablet by mouth once daily  30 tablet  5  .  fenofibrate 160 MG tablet take 1 tablet by mouth once daily with food  30 tablet  3  . furosemide (LASIX) 20 MG tablet Take 1 tablet (20 mg total) by mouth daily.  30 tablet  11  . hydrALAZINE (APRESOLINE) 25 MG tablet take 1 tablet by mouth three times a day  90 tablet  6  . hydrOXYzine (ATARAX/VISTARIL) 25 MG tablet Take 1 tablet (25 mg total) by mouth 3 (three) times daily as needed for itching.  60 tablet  5  . levothyroxine (SYNTHROID, LEVOTHROID) 112 MCG tablet Take 112 mcg by mouth daily before breakfast.      . loperamide (IMODIUM) 2 MG capsule Take 2 mg by mouth 4 (four) times daily as needed for diarrhea or loose stools.      . magnesium oxide (MAG-OX) 400 MG tablet Take 400 mg by mouth daily.       Marland Kitchen NEXIUM 40 MG capsule take 1 capsule by mouth once daily  30 capsule  5  . ondansetron (ZOFRAN ODT) 4 MG disintegrating tablet Take 1 tablet (4 mg total) by mouth every 8 (eight)  hours as needed for nausea.  30 tablet  2  . PARoxetine (PAXIL) 30 MG tablet take 1 tablet by mouth once daily  30 tablet  3  . pravastatin (PRAVACHOL) 20 MG tablet take 1 tablet by mouth once daily  30 tablet  6  . traMADol (ULTRAM) 50 MG tablet Take 1 tablet (50 mg total) by mouth every 6 (six) hours as needed for pain.  30 tablet  2    No results found for this or any previous visit (from the past 48 hour(s)). No results found.  ROS  Blood pressure 137/68, pulse 68, temperature 98.9 F (37.2 C), temperature source Oral, resp. rate 18, SpO2 93.00%. Physical Exam  Constitutional: She appears well-developed and well-nourished.  Eyes: Conjunctivae are normal. No scleral icterus.  Neck: No thyromegaly present.  Cardiovascular: Normal rate, regular rhythm and normal heart sounds.   No murmur heard. Respiratory: Effort normal and breath sounds normal.  GI: Soft. She exhibits no distension and no mass. There is no tenderness.  Musculoskeletal: She exhibits no edema.  Lymphadenopathy:    She has no cervical adenopathy.  Neurological: She is alert.  Skin: Skin is warm and dry.     Assessment/Plan Iron deficiency anemia. Chronic diarrhea possibly related to prior partial colectomy. EGD and flexible sigmoidoscopy.  [name] U 03/19/2013, 1:59 PM

## 2013-03-20 LAB — H. PYLORI ANTIBODY, IGG: H Pylori IgG: 0.88 {ISR}

## 2013-03-24 ENCOUNTER — Telehealth (INDEPENDENT_AMBULATORY_CARE_PROVIDER_SITE_OTHER): Payer: Self-pay | Admitting: *Deleted

## 2013-03-24 ENCOUNTER — Encounter: Payer: Self-pay | Admitting: *Deleted

## 2013-03-24 DIAGNOSIS — D509 Iron deficiency anemia, unspecified: Secondary | ICD-10-CM

## 2013-03-24 NOTE — Telephone Encounter (Signed)
Per Dr.Rehman the patient will need to have labs drawn in 1 month. 

## 2013-03-25 ENCOUNTER — Encounter (HOSPITAL_COMMUNITY): Payer: Self-pay | Admitting: Internal Medicine

## 2013-03-26 ENCOUNTER — Other Ambulatory Visit (INDEPENDENT_AMBULATORY_CARE_PROVIDER_SITE_OTHER): Payer: Self-pay | Admitting: *Deleted

## 2013-03-26 ENCOUNTER — Encounter (INDEPENDENT_AMBULATORY_CARE_PROVIDER_SITE_OTHER): Payer: Self-pay | Admitting: *Deleted

## 2013-03-26 DIAGNOSIS — D509 Iron deficiency anemia, unspecified: Secondary | ICD-10-CM

## 2013-03-28 ENCOUNTER — Other Ambulatory Visit: Payer: Self-pay | Admitting: Family Medicine

## 2013-03-28 DIAGNOSIS — Z139 Encounter for screening, unspecified: Secondary | ICD-10-CM

## 2013-04-01 ENCOUNTER — Encounter (INDEPENDENT_AMBULATORY_CARE_PROVIDER_SITE_OTHER): Payer: Self-pay

## 2013-04-14 ENCOUNTER — Ambulatory Visit (HOSPITAL_COMMUNITY)
Admission: RE | Admit: 2013-04-14 | Discharge: 2013-04-14 | Disposition: A | Discharge status: Home / Self Care | Payer: Medicare Other | Source: Ambulatory Visit | Attending: Family Medicine | Admitting: Family Medicine

## 2013-04-14 DIAGNOSIS — Z139 Encounter for screening, unspecified: Secondary | ICD-10-CM

## 2013-04-14 DIAGNOSIS — Z1231 Encounter for screening mammogram for malignant neoplasm of breast: Secondary | ICD-10-CM | POA: Insufficient documentation

## 2013-04-16 ENCOUNTER — Other Ambulatory Visit: Payer: Self-pay | Admitting: Family Medicine

## 2013-04-21 DIAGNOSIS — D509 Iron deficiency anemia, unspecified: Secondary | ICD-10-CM | POA: Diagnosis not present

## 2013-04-21 DIAGNOSIS — N189 Chronic kidney disease, unspecified: Secondary | ICD-10-CM | POA: Diagnosis not present

## 2013-04-21 DIAGNOSIS — E559 Vitamin D deficiency, unspecified: Secondary | ICD-10-CM | POA: Diagnosis not present

## 2013-04-21 DIAGNOSIS — D649 Anemia, unspecified: Secondary | ICD-10-CM | POA: Diagnosis not present

## 2013-04-21 DIAGNOSIS — R809 Proteinuria, unspecified: Secondary | ICD-10-CM | POA: Diagnosis not present

## 2013-04-21 DIAGNOSIS — Z79899 Other long term (current) drug therapy: Secondary | ICD-10-CM | POA: Diagnosis not present

## 2013-04-21 LAB — HEMOGLOBIN AND HEMATOCRIT, BLOOD: HCT: 33.4 % — ABNORMAL LOW (ref 36.0–46.0)

## 2013-04-23 ENCOUNTER — Encounter (INDEPENDENT_AMBULATORY_CARE_PROVIDER_SITE_OTHER): Payer: Self-pay | Admitting: *Deleted

## 2013-04-23 ENCOUNTER — Telehealth (INDEPENDENT_AMBULATORY_CARE_PROVIDER_SITE_OTHER): Payer: Self-pay | Admitting: *Deleted

## 2013-04-23 DIAGNOSIS — E559 Vitamin D deficiency, unspecified: Secondary | ICD-10-CM | POA: Diagnosis not present

## 2013-04-23 DIAGNOSIS — N184 Chronic kidney disease, stage 4 (severe): Secondary | ICD-10-CM | POA: Diagnosis not present

## 2013-04-23 DIAGNOSIS — D649 Anemia, unspecified: Secondary | ICD-10-CM | POA: Diagnosis not present

## 2013-04-23 DIAGNOSIS — D509 Iron deficiency anemia, unspecified: Secondary | ICD-10-CM

## 2013-04-23 DIAGNOSIS — R809 Proteinuria, unspecified: Secondary | ICD-10-CM | POA: Diagnosis not present

## 2013-04-23 DIAGNOSIS — I10 Essential (primary) hypertension: Secondary | ICD-10-CM | POA: Diagnosis not present

## 2013-04-23 NOTE — Telephone Encounter (Signed)
Per Dr.Rehman the patient will need to have labs drawn in 1 month per NUR.

## 2013-04-28 ENCOUNTER — Telehealth: Payer: Self-pay | Admitting: Family Medicine

## 2013-04-28 NOTE — Telephone Encounter (Signed)
Does she need blood work for an appt on 05/16/13 if so please do and call pt when she needs to go to the lab

## 2013-05-07 ENCOUNTER — Other Ambulatory Visit: Payer: Self-pay | Admitting: *Deleted

## 2013-05-07 DIAGNOSIS — E785 Hyperlipidemia, unspecified: Secondary | ICD-10-CM

## 2013-05-07 DIAGNOSIS — I10 Essential (primary) hypertension: Secondary | ICD-10-CM

## 2013-05-07 DIAGNOSIS — E039 Hypothyroidism, unspecified: Secondary | ICD-10-CM

## 2013-05-07 DIAGNOSIS — Z79899 Other long term (current) drug therapy: Secondary | ICD-10-CM

## 2013-05-07 NOTE — Telephone Encounter (Signed)
Placed orders for Lip liv tsh m7, and called to let patient know. She verbalized understanding.

## 2013-05-07 NOTE — Telephone Encounter (Signed)
Lip liv tsh m7

## 2013-05-13 DIAGNOSIS — I10 Essential (primary) hypertension: Secondary | ICD-10-CM | POA: Diagnosis not present

## 2013-05-13 DIAGNOSIS — E785 Hyperlipidemia, unspecified: Secondary | ICD-10-CM | POA: Diagnosis not present

## 2013-05-13 DIAGNOSIS — E039 Hypothyroidism, unspecified: Secondary | ICD-10-CM | POA: Diagnosis not present

## 2013-05-13 DIAGNOSIS — Z79899 Other long term (current) drug therapy: Secondary | ICD-10-CM | POA: Diagnosis not present

## 2013-05-13 LAB — LIPID PANEL
Cholesterol: 152 mg/dL (ref 0–200)
HDL: 46 mg/dL (ref >39)
LDL Cholesterol: 79 mg/dL (ref 0–99)
Total CHOL/HDL Ratio: 3.3 Ratio
Triglycerides: 137 mg/dL (ref <150)
VLDL: 27 mg/dL (ref 0–40)

## 2013-05-13 LAB — BASIC METABOLIC PANEL
BUN: 23 mg/dL (ref 6–23)
CO2: 25 mEq/L (ref 19–32)
Calcium: 9.3 mg/dL (ref 8.4–10.5)
Creat: 2.03 mg/dL — ABNORMAL HIGH (ref 0.50–1.10)
Glucose, Bld: 98 mg/dL (ref 70–99)
Potassium: 3.7 mEq/L (ref 3.5–5.3)
Sodium: 138 mEq/L (ref 135–145)

## 2013-05-13 LAB — HEPATIC FUNCTION PANEL
ALT: 10 U/L (ref 0–35)
Albumin: 4.1 g/dL (ref 3.5–5.2)
Alkaline Phosphatase: 54 U/L (ref 39–117)
Bilirubin, Direct: 0.1 mg/dL (ref 0.0–0.3)
Total Protein: 6.7 g/dL (ref 6.0–8.3)

## 2013-05-14 ENCOUNTER — Other Ambulatory Visit: Payer: Self-pay | Admitting: Family Medicine

## 2013-05-14 ENCOUNTER — Encounter (INDEPENDENT_AMBULATORY_CARE_PROVIDER_SITE_OTHER): Payer: Self-pay | Admitting: *Deleted

## 2013-05-16 ENCOUNTER — Ambulatory Visit (INDEPENDENT_AMBULATORY_CARE_PROVIDER_SITE_OTHER): Payer: Medicare Other | Admitting: Family Medicine

## 2013-05-16 ENCOUNTER — Encounter: Payer: Self-pay | Admitting: Family Medicine

## 2013-05-16 VITALS — BP 128/64 | Ht 67.0 in | Wt 170.0 lb

## 2013-05-16 DIAGNOSIS — K219 Gastro-esophageal reflux disease without esophagitis: Secondary | ICD-10-CM | POA: Diagnosis not present

## 2013-05-16 DIAGNOSIS — I10 Essential (primary) hypertension: Secondary | ICD-10-CM | POA: Diagnosis not present

## 2013-05-16 DIAGNOSIS — G4700 Insomnia, unspecified: Secondary | ICD-10-CM

## 2013-05-16 DIAGNOSIS — N189 Chronic kidney disease, unspecified: Secondary | ICD-10-CM

## 2013-05-16 DIAGNOSIS — F411 Generalized anxiety disorder: Secondary | ICD-10-CM

## 2013-05-16 DIAGNOSIS — I2510 Atherosclerotic heart disease of native coronary artery without angina pectoris: Secondary | ICD-10-CM | POA: Diagnosis not present

## 2013-05-16 MED ORDER — ENALAPRIL MALEATE 20 MG PO TABS: 20.0000 mg | ORAL_TABLET | Freq: Every day | ORAL | Status: DC

## 2013-05-16 MED ORDER — LEVOTHYROXINE SODIUM 137 MCG PO TABS: ORAL_TABLET | ORAL | Status: DC

## 2013-05-16 NOTE — Progress Notes (Signed)
  Subjective:    Patient ID: [name], female    DOB: 1935/01/05, 78 y.o.   MRN: [id number]  HPIHere for a follow up on bloodwork.   Patient reports compliance with blood pressure medication. No obvious side effects. Trying to watch her diet. Cut down salt intake.  Compliant with lipid medicine. No obvious side effects. Watching fats in her diet.  Unfortunately still smoking.  Reflux is stable on medication.  Reports ongoing insomnia. Ambien definitely helps is.  Claims compliance with thyroid medication. Claims no symptoms of high or low thyroid. Dose adjusted 6 months ago.  Results for orders placed in visit on 05/07/13  LIPID PANEL      Result Value Range   Cholesterol 152  0 - 200 mg/dL   Triglycerides 409  <811 mg/dL   HDL 46  >91 mg/dL   Total CHOL/HDL Ratio 3.3     VLDL 27  0 - 40 mg/dL   LDL Cholesterol 79  0 - 99 mg/dL  HEPATIC FUNCTION PANEL      Result Value Range   Total Bilirubin 0.4  0.3 - 1.2 mg/dL   Bilirubin, Direct 0.1  0.0 - 0.3 mg/dL   Indirect Bilirubin 0.3  0.0 - 0.9 mg/dL   Alkaline Phosphatase 54  39 - 117 U/L   AST 21  0 - 37 U/L   ALT 10  0 - 35 U/L   Total Protein 6.7  6.0 - 8.3 g/dL   Albumin 4.1  3.5 - 5.2 g/dL  TSH      Result Value Range   TSH 12.132 (*) 0.350 - 4.500 uIU/mL  BASIC METABOLIC PANEL      Result Value Range   Sodium 138  135 - 145 mEq/L   Potassium 3.7  3.5 - 5.3 mEq/L   Chloride 104  96 - 112 mEq/L   CO2 25  19 - 32 mEq/L   Glucose, Bld 98  70 - 99 mg/dL   BUN 23  6 - 23 mg/dL   Creat 4.78 (*) 2.95 - 1.10 mg/dL   Calcium 9.3  8.4 - 62.1 mg/dL     Concerns about a bruise on left arm.   Needs refill on zofran.   Already had flu vaccine.   Review of Systems    no constipation no chest pain no abdominal pain complete multisystem ROS otherwise negative Objective:   Physical Exam  Alert talkative no acute distress. HEENT normal. Lungs clear. Heart regular rate and rhythm. Ankles without edema. Left arm  hematoma at site of blood trial within normal limits     Assessment & Plan:  Impression 1 hyperlipidemia good control. #2 hypertension ongoing. Good control. #3 insomnia stable on meds. #4 depression clinically stable. #5 hematoma discussed. #6 chronic smoking encouraged to quit. #7 hypothyroidism suboptimum control discussed. Plan thyroid medicine adjusted. Diet exercise discussed. Encouraged to stop smoking. Recheck in several months. 35 minutes spent most in discussion do a wellness exam. We will do this with care 1. Patient advised that visit should focus on preventive and early detection issues. Disease matters manage today and again in 6 months WSL

## 2013-05-24 DIAGNOSIS — D509 Iron deficiency anemia, unspecified: Secondary | ICD-10-CM | POA: Diagnosis not present

## 2013-06-12 ENCOUNTER — Other Ambulatory Visit: Payer: Self-pay | Admitting: Family Medicine

## 2013-06-12 NOTE — Telephone Encounter (Signed)
May refill x2 ?

## 2013-06-17 ENCOUNTER — Other Ambulatory Visit: Payer: Self-pay | Admitting: *Deleted

## 2013-06-17 ENCOUNTER — Other Ambulatory Visit: Payer: Self-pay | Admitting: Family Medicine

## 2013-06-17 MED ORDER — TRAMADOL HCL 50 MG PO TABS: 50.0000 mg | ORAL_TABLET | Freq: Four times a day (QID) | ORAL | Status: DC | PRN

## 2013-06-20 ENCOUNTER — Ambulatory Visit (INDEPENDENT_AMBULATORY_CARE_PROVIDER_SITE_OTHER): Payer: Medicare Other | Admitting: Family Medicine

## 2013-06-20 ENCOUNTER — Encounter: Payer: Self-pay | Admitting: Family Medicine

## 2013-06-20 VITALS — BP 162/80 | Temp 99.7°F | Ht 67.0 in | Wt 171.0 lb

## 2013-06-20 DIAGNOSIS — R042 Hemoptysis: Secondary | ICD-10-CM | POA: Diagnosis not present

## 2013-06-20 DIAGNOSIS — J209 Acute bronchitis, unspecified: Secondary | ICD-10-CM

## 2013-06-20 MED ORDER — ONDANSETRON HCL 8 MG PO TABS
8.0000 mg | ORAL_TABLET | Freq: Three times a day (TID) | ORAL | Status: DC | PRN
Start: 2013-06-20 — End: 2013-07-14

## 2013-06-20 MED ORDER — CEFPROZIL 500 MG PO TABS: 500.0000 mg | ORAL_TABLET | Freq: Two times a day (BID) | ORAL | Status: DC

## 2013-06-20 NOTE — Progress Notes (Signed)
   Subjective:    Patient ID: [name], female    DOB: [birth year], 78 y.o.   MRN: [id number]  Cough The current episode started in the past 7 days. Associated symptoms include a fever, headaches, nasal congestion, rhinorrhea and wheezing. Pertinent negatives include no chest pain, ear pain or shortness of breath. Associated symptoms comments: nausea.  dizziness, Taking zofran for nausea not helping.  Cough, sneezing started 2 weeks ago, worse 2 days ago. C/o Ha and sinus pain, cough makes her nausea and presyncope Discolored phlegm Some phlegm was red tinged Night sweats PMH smoker Review of Systems  Constitutional: Positive for fever. Negative for activity change.  HENT: Positive for congestion and rhinorrhea. Negative for ear pain.   Eyes: Negative for discharge.  Respiratory: Positive for cough and wheezing. Negative for shortness of breath.   Cardiovascular: Negative for chest pain.  Neurological: Positive for headaches.       Objective:   Physical Exam  Nursing note and vitals reviewed. Constitutional: She appears well-developed.  HENT:  Head: Normocephalic.  Nose: Nose normal.  Mouth/Throat: Oropharynx is clear and moist. No oropharyngeal exudate.  Neck: Neck supple.  Cardiovascular: Normal rate and normal heart sounds.   No murmur heard. Pulmonary/Chest: Effort normal and breath sounds normal. She has no wheezes.  Lymphadenopathy:    She has no cervical adenopathy.  Skin: Skin is warm and dry.          Assessment & Plan:  URI/2 bronchitis-antibiotics prescribed. Warning signs discussed. Hemoptysis-patient had one episode she is a long-term smoker she needs a chest scan also followup in a couple weeks if ever having this again she will need bronchoscopy Warning signs discussed if worse go to ER Patient was told to quit smoking

## 2013-06-25 ENCOUNTER — Ambulatory Visit (HOSPITAL_COMMUNITY)
Admission: RE | Admit: 2013-06-25 | Discharge: 2013-06-25 | Disposition: A | Discharge status: Home / Self Care | Payer: Medicare Other | Source: Ambulatory Visit | Attending: Family Medicine | Admitting: Family Medicine

## 2013-06-25 DIAGNOSIS — R042 Hemoptysis: Secondary | ICD-10-CM | POA: Insufficient documentation

## 2013-06-25 DIAGNOSIS — K7689 Other specified diseases of liver: Secondary | ICD-10-CM | POA: Diagnosis not present

## 2013-06-25 DIAGNOSIS — J438 Other emphysema: Secondary | ICD-10-CM | POA: Insufficient documentation

## 2013-06-25 DIAGNOSIS — I517 Cardiomegaly: Secondary | ICD-10-CM | POA: Insufficient documentation

## 2013-06-25 DIAGNOSIS — R222 Localized swelling, mass and lump, trunk: Secondary | ICD-10-CM | POA: Insufficient documentation

## 2013-06-25 DIAGNOSIS — R911 Solitary pulmonary nodule: Secondary | ICD-10-CM | POA: Diagnosis not present

## 2013-06-25 DIAGNOSIS — J449 Chronic obstructive pulmonary disease, unspecified: Secondary | ICD-10-CM | POA: Diagnosis not present

## 2013-06-26 HISTORY — PX: DG BIOPSY LUNG: HXRAD146

## 2013-06-30 ENCOUNTER — Telehealth: Payer: Self-pay | Admitting: Family Medicine

## 2013-06-30 NOTE — Telephone Encounter (Signed)
error 

## 2013-06-30 NOTE — Progress Notes (Signed)
Patient notified and transferred up front to make appt with Dr. Richardson Landry tomorrow (Tues)

## 2013-07-01 ENCOUNTER — Ambulatory Visit (INDEPENDENT_AMBULATORY_CARE_PROVIDER_SITE_OTHER): Payer: Medicare Other | Admitting: Family Medicine

## 2013-07-01 ENCOUNTER — Other Ambulatory Visit (HOSPITAL_COMMUNITY): Payer: Self-pay | Admitting: Hematology and Oncology

## 2013-07-01 ENCOUNTER — Encounter: Payer: Self-pay | Admitting: Family Medicine

## 2013-07-01 VITALS — BP 130/72 | Ht 67.0 in | Wt 169.5 lb

## 2013-07-01 DIAGNOSIS — K7689 Other specified diseases of liver: Secondary | ICD-10-CM

## 2013-07-01 DIAGNOSIS — R222 Localized swelling, mass and lump, trunk: Secondary | ICD-10-CM

## 2013-07-01 DIAGNOSIS — K769 Liver disease, unspecified: Secondary | ICD-10-CM

## 2013-07-01 DIAGNOSIS — R918 Other nonspecific abnormal finding of lung field: Secondary | ICD-10-CM

## 2013-07-01 MED ORDER — LEVOFLOXACIN 500 MG PO TABS: 500.0000 mg | ORAL_TABLET | Freq: Every day | ORAL | Status: AC

## 2013-07-01 NOTE — Patient Instructions (Signed)
The scan shows a concerning area in the left lower lung which could possibly be a tumor, the readiologist also said it could possibly be a patch of pneumonia or infection. Also, scan revealed a couple small spots on the liver. Once again, this could be something serious or not. We need to have a specialist decide what the next best set of tests are to pin this down. It may include the need for a biopsy or further scanning. We will work on the referral right away.

## 2013-07-01 NOTE — Progress Notes (Signed)
   Subjective:    Patient ID: [name], female    DOB: 24-May-1935, 79 y.o.   MRN: [id number]  HPI Patient is here today to get the results of the cat scan that was done on her chest last week. She has no other concerns at this time.   Patient reports her cough has improved somewhat. No longer experiencing hemoptysis. No chest pain. No obvious shortness of breath  Unfortunately still smoking. See prior notes in this regard.  Day # daughter Aurelio Brash [phone], needs referral, pt would prefer we contact daught er Review of Systems ROS otherwise negative    Objective:   Physical Exam  Alert no apparent distress. Lungs clear other than occasional basilar crackle. No wheezes no tachypnea heart regular in rhythm.  Scan reviewed at length    Assessment & Plan:  Impression left lower lobe mass. Possibly infiltrate the radiologist leaning towards true mass. Also accompanied by 2 new liver lesions. Importance of this discussed with patient. Likely represents primary pulmonary cancer. With potential metastatic disease already present, I need appropriate guidance had her proceed. Discussed with patient. We'll have an oncology supervise workup. Rationale discussed. Plan consultation. WSL

## 2013-07-02 ENCOUNTER — Encounter (HOSPITAL_BASED_OUTPATIENT_CLINIC_OR_DEPARTMENT_OTHER): Payer: Medicare Other

## 2013-07-02 ENCOUNTER — Encounter (HOSPITAL_COMMUNITY): Payer: Self-pay

## 2013-07-02 ENCOUNTER — Encounter (HOSPITAL_COMMUNITY): Payer: Medicare Other | Attending: Hematology and Oncology

## 2013-07-02 VITALS — BP 123/74 | HR 67 | Temp 98.7°F | Resp 20 | Ht 67.0 in | Wt 171.2 lb

## 2013-07-02 DIAGNOSIS — J449 Chronic obstructive pulmonary disease, unspecified: Secondary | ICD-10-CM | POA: Insufficient documentation

## 2013-07-02 DIAGNOSIS — R042 Hemoptysis: Secondary | ICD-10-CM | POA: Diagnosis not present

## 2013-07-02 DIAGNOSIS — K219 Gastro-esophageal reflux disease without esophagitis: Secondary | ICD-10-CM | POA: Insufficient documentation

## 2013-07-02 DIAGNOSIS — C3430 Malignant neoplasm of lower lobe, unspecified bronchus or lung: Secondary | ICD-10-CM | POA: Diagnosis not present

## 2013-07-02 DIAGNOSIS — D638 Anemia in other chronic diseases classified elsewhere: Secondary | ICD-10-CM | POA: Insufficient documentation

## 2013-07-02 DIAGNOSIS — R918 Other nonspecific abnormal finding of lung field: Secondary | ICD-10-CM | POA: Diagnosis not present

## 2013-07-02 DIAGNOSIS — E039 Hypothyroidism, unspecified: Secondary | ICD-10-CM | POA: Diagnosis not present

## 2013-07-02 DIAGNOSIS — I10 Essential (primary) hypertension: Secondary | ICD-10-CM | POA: Diagnosis not present

## 2013-07-02 DIAGNOSIS — J4489 Other specified chronic obstructive pulmonary disease: Secondary | ICD-10-CM | POA: Insufficient documentation

## 2013-07-02 LAB — CBC WITH DIFFERENTIAL/PLATELET
Basophils Absolute: 0.1 10*3/uL (ref 0.0–0.1)
Basophils Relative: 1 % (ref 0–1)
EOS PCT: 4 % (ref 0–5)
Eosinophils Absolute: 0.2 10*3/uL (ref 0.0–0.7)
HCT: 32.5 % — ABNORMAL LOW (ref 36.0–46.0)
Hemoglobin: 10.7 g/dL — ABNORMAL LOW (ref 12.0–15.0)
LYMPHS ABS: 1.4 10*3/uL (ref 0.7–4.0)
LYMPHS PCT: 29 % (ref 12–46)
MCH: 32.8 pg (ref 26.0–34.0)
MCHC: 32.9 g/dL (ref 30.0–36.0)
MCV: 99.7 fL (ref 78.0–100.0)
MONO ABS: 0.4 10*3/uL (ref 0.1–1.0)
Monocytes Relative: 9 % (ref 3–12)
Neutro Abs: 2.8 10*3/uL (ref 1.7–7.7)
Neutrophils Relative %: 58 % (ref 43–77)
Platelets: 278 10*3/uL (ref 150–400)
RBC: 3.26 MIL/uL — AB (ref 3.87–5.11)
RDW: 14.2 % (ref 11.5–15.5)
WBC: 4.9 10*3/uL (ref 4.0–10.5)

## 2013-07-02 LAB — COMPREHENSIVE METABOLIC PANEL
ALT: 10 U/L (ref 0–35)
AST: 25 U/L (ref 0–37)
Albumin: 3.6 g/dL (ref 3.5–5.2)
Alkaline Phosphatase: 57 U/L (ref 39–117)
BUN: 17 mg/dL (ref 6–23)
CO2: 26 meq/L (ref 19–32)
CREATININE: 1.86 mg/dL — AB (ref 0.50–1.10)
Calcium: 9.4 mg/dL (ref 8.4–10.5)
Chloride: 101 mEq/L (ref 96–112)
GFR calc Af Amer: 29 mL/min — ABNORMAL LOW (ref >90)
GFR, EST NON AFRICAN AMERICAN: 25 mL/min — AB (ref >90)
Glucose, Bld: 105 mg/dL — ABNORMAL HIGH (ref 70–99)
Potassium: 3.4 mEq/L — ABNORMAL LOW (ref 3.7–5.3)
Sodium: 141 mEq/L (ref 137–147)
TOTAL PROTEIN: 7.1 g/dL (ref 6.0–8.3)
Total Bilirubin: 0.4 mg/dL (ref 0.3–1.2)

## 2013-07-02 LAB — PROTIME-INR
INR: 1.09 (ref 0.00–1.49)
PROTHROMBIN TIME: 13.9 s (ref 11.6–15.2)

## 2013-07-02 LAB — APTT: aPTT: 37 seconds (ref 24–37)

## 2013-07-02 LAB — LACTATE DEHYDROGENASE: LDH: 123 U/L (ref 94–250)

## 2013-07-02 MED ORDER — ALBUTEROL SULFATE (2.5 MG/3ML) 0.083% IN NEBU
INHALATION_SOLUTION | RESPIRATORY_TRACT | Status: AC
  Filled 2013-07-02: qty 3

## 2013-07-02 NOTE — Progress Notes (Signed)
Labs drawn today for ptt,pt,ldh,cmp,cbc/diff

## 2013-07-02 NOTE — Progress Notes (Signed)
Frost A. Barnet Glasgow, M.D.  NEW PATIENT EVALUATION   Name: [name] Date: 07/02/2013 MRN: [id number] DOB: 03-01-1935  PCP: [name], MD   REFERRING PHYSICIAN: Mikey Kirschner, MD  REASON FOR REFERRAL: Hemoptysis with lung mass.     HISTORY OF PRESENT ILLNESS:[name] is a 79 y.o. female who is referred by her family physician because of the hemoptysis and mass left lower lobe on chest CT. Liver nodules were also documented. The patient has experienced intermittent bouts of hemoptysis over the past 2 weeks not associated with chest pain, fever, or chills. She does have a chronic cough and continues to smoke one pack of cigarettes daily. She denies any dysphagia, PND, orthopnea, or palpitations. She does get night sweats not unlike she has experienced for years. She denies any lower extremity swelling or redness, abdominal pain, back pain, and has had chronic diarrhea for at least 7-8 years attributed to [place] we'll bowel syndrome after GI workup is completed. She denies any melena, hematochezia, hematuria, or epistaxis. Appetite has been fair with weight loss over the previous 6-8 weeks, not quantified. Because of hemoptysis she was seen by her family physician who ordered a CAT scan of the chest revealing a mass in the left lower lobe with evidence of abnormalities in the liver as well and for that reason she was referred here today.   PAST MEDICAL HISTORY:  has a past medical history of Hypertension; Hypothyroidism; Hyperlipidemia; Insomnia; GERD (gastroesophageal reflux disease); Adrenal adenoma; Chronic diarrhea; Fatty liver; Arthritis; QT prolongation; Coronary artery disease; Impaired fasting glucose; Renal insufficiency; Tobacco use; Family history of heart disease; IBS (irritable bowel syndrome); and Peripheral arterial disease.     PAST SURGICAL HISTORY: Past Surgical History  Procedure Laterality Date  . Abdominal  hysterectomy    . Thyroidectomy, partial    . Colonoscopy  5/04    normal  . Abdominal aortic aneurysm repair  07/2006    D. J.D. Kellie Simmering  . Cataract extraction Bilateral 2010  . Cardiac catheterization  02/2010    mod CAD in L-dominant system with normal LV function  . Colon resection  2005    1/2 colon removed  . Transthoracic echocardiogram  02/2010    EF 55-60%; mild conc LVH, normal systolic function; mildly calcified AV annulus  . Colonoscopy with esophagogastroduodenoscopy (egd) N/A 03/19/2013    Procedure: COLONOSCOPY WITH ESOPHAGOGASTRODUODENOSCOPY (EGD);  Surgeon: Rogene Houston, MD;  Location: [facility];  Service: Endoscopy;  Laterality: N/A;  145     CURRENT MEDICATIONS: has a current medication list which includes the following prescription(s): allopurinol, alprazolam, amlodipine, aspirin, cholecalciferol, enalapril, fenofibrate, ferrous sulfate, furosemide, hydralazine, hydroxyzine, levofloxacin, levothyroxine, loperamide, magnesium oxide, nexium, ondansetron, paroxetine, pravastatin, tramadol, and zolpidem.   ALLERGIES: Aleve; Dilaudid; Dyazide; and Zithromax   SOCIAL HISTORY:  reports that she has been smoking Cigarettes.  She has a 29.5 pack-year smoking history. She has never used smokeless tobacco. She reports that she does not drink alcohol or use illicit drugs.   FAMILY HISTORY: family history includes Diabetes in her mother; Heart attack in her mother; Hyperlipidemia in her sister; Hypertension in her mother; Kidney disease in her brother.    REVIEW OF SYSTEMS:  Other than that discussed above is noncontributory.    PHYSICAL EXAM:  height is 5' 7"  (1.702 m) and weight is 171 lb 3.2 oz (77.656 kg). Her oral temperature is 98.7 F (37.1 C). Her blood  pressure is 123/74 and her pulse is 67. Her respiration is 20.    GENERAL:alert, no distress and comfortable SKIN: skin color, texture, turgor are normal, no rashes or significant lesions EYES: normal,  Conjunctiva are pink and non-injected, sclera clear OROPHARYNX:no exudate, no erythema and lips, buccal mucosa, and tongue normal  NECK: supple, thyroid normal size, non-tender, without nodularity CHEST: Increased AP diameter with no breast masses. LYMPH:  no palpable lymphadenopathy in the cervical, axillary or inguinal LUNGS: clear to auscultation and percussion with normal breathing effort HEART: regular rate & rhythm and no murmurs ABDOMEN:abdomen soft, non-tender and normal bowel sounds MUSCULOSKELETALl:no cyanosis of digits, no clubbing or edema  NEURO: alert & oriented x 3 with fluent speech, no focal motor/sensory deficits    LABORATORY DATA:  Office Visit on 07/02/2013  Component Date Value Range Status  . Sodium 07/02/2013 141  137 - 147 mEq/L Final  . Potassium 07/02/2013 3.4* 3.7 - 5.3 mEq/L Final  . Chloride 07/02/2013 101  96 - 112 mEq/L Final  . CO2 07/02/2013 26  19 - 32 mEq/L Final  . Glucose, Bld 07/02/2013 105* 70 - 99 mg/dL Final  . BUN 07/02/2013 17  6 - 23 mg/dL Final  . Creatinine, Ser 07/02/2013 1.86* 0.50 - 1.10 mg/dL Final  . Calcium 07/02/2013 9.4  8.4 - 10.5 mg/dL Final  . Total Protein 07/02/2013 7.1  6.0 - 8.3 g/dL Final  . Albumin 07/02/2013 3.6  3.5 - 5.2 g/dL Final  . AST 07/02/2013 25  0 - 37 U/L Final  . ALT 07/02/2013 10  0 - 35 U/L Final  . Alkaline Phosphatase 07/02/2013 57  39 - 117 U/L Final  . Total Bilirubin 07/02/2013 0.4  0.3 - 1.2 mg/dL Final  . GFR calc non Af Amer 07/02/2013 25* >90 mL/min Final  . GFR calc Af Amer 07/02/2013 29* >90 mL/min Final   Comment: (NOTE)                          The eGFR has been calculated using the CKD EPI equation.                          This calculation has not been validated in all clinical situations.                          eGFR's persistently <90 mL/min signify possible Chronic Kidney                          Disease.  Marland Kitchen LDH 07/02/2013 123  94 - 250 U/L Final  . Prothrombin Time 07/02/2013 13.9   11.6 - 15.2 seconds Final  . INR 07/02/2013 1.09  0.00 - 1.49 Final  . aPTT 07/02/2013 37  24 - 37 seconds Final   Comment:                                 IF BASELINE aPTT IS ELEVATED,                          SUGGEST PATIENT RISK ASSESSMENT                          BE USED TO DETERMINE APPROPRIATE  ANTICOAGULANT THERAPY.  . WBC 07/02/2013 4.9  4.0 - 10.5 K/uL Final  . RBC 07/02/2013 3.26* 3.87 - 5.11 MIL/uL Final  . Hemoglobin 07/02/2013 10.7* 12.0 - 15.0 g/dL Final  . HCT 07/02/2013 32.5* 36.0 - 46.0 % Final  . MCV 07/02/2013 99.7  78.0 - 100.0 fL Final  . MCH 07/02/2013 32.8  26.0 - 34.0 pg Final  . MCHC 07/02/2013 32.9  30.0 - 36.0 g/dL Final  . RDW 07/02/2013 14.2  11.5 - 15.5 % Final  . Platelets 07/02/2013 278  150 - 400 K/uL Final  . Neutrophils Relative % 07/02/2013 58  43 - 77 % Final  . Neutro Abs 07/02/2013 2.8  1.7 - 7.7 K/uL Final  . Lymphocytes Relative 07/02/2013 29  12 - 46 % Final  . Lymphs Abs 07/02/2013 1.4  0.7 - 4.0 K/uL Final  . Monocytes Relative 07/02/2013 9  3 - 12 % Final  . Monocytes Absolute 07/02/2013 0.4  0.1 - 1.0 K/uL Final  . Eosinophils Relative 07/02/2013 4  0 - 5 % Final  . Eosinophils Absolute 07/02/2013 0.2  0.0 - 0.7 K/uL Final  . Basophils Relative 07/02/2013 1  0 - 1 % Final  . Basophils Absolute 07/02/2013 0.1  0.0 - 0.1 K/uL Final    Urinalysis    Component Value Date/Time   COLORURINE YELLOW 03/20/2010 1405   APPEARANCEUR CLEAR 03/20/2010 1405   LABSPEC 1.015 03/20/2010 1405   PHURINE 6.0 03/20/2010 [place] 03/20/2010 1405   HGBUR SMALL* 03/20/2010 [place] 03/20/2010 [place] 03/20/2010 1405   PROTEINUR NEGATIVE 03/20/2010 1405   UROBILINOGEN 0.2 03/20/2010 1405   NITRITE NEGATIVE 03/20/2010 [place] 03/20/2010 1405      @RADIOGRAPHY : Ct Chest Wo Contrast  06/25/2013   CLINICAL DATA:  Hemoptysis for the past 2 weeks.  EXAM: CT CHEST  WITHOUT CONTRAST  TECHNIQUE: Multidetector CT imaging of the chest was performed following the standard protocol without IV contrast.  COMPARISON:  No priors.  FINDINGS: Mediastinum: Heart size is mildly enlarged. There is no significant pericardial fluid, thickening or pericardial calcification. There is atherosclerosis of the thoracic aorta, the great vessels of the mediastinum and the coronary arteries, including calcified atherosclerotic plaque in the left main, left anterior descending and left circumflex coronary arteries. No pathologically enlarged mediastinal or hilar lymph nodes. Please note that accurate exclusion of hilar adenopathy is limited on noncontrast CT scans. Esophagus is unremarkable in appearance.  Lungs/Pleura: New mass like opacity measuring 4.0 x 2.8 cm in the posterior aspect of the left lower lobe. This lesion has macrolobulated margins with some spiculations and a small amount of surrounding the ground-glass attenuation. There is a tiny focus of internal cavitation associated with this lesion. Some air bronchograms are noted along the periphery of the lesion, however, these appear displaced outward by the lesion suggesting that this is in fact a mass, rather than airspace disease (although this is not definitive at this time). This lesion does make contact with the pleura posteriorly. In addition, there is an irregular shaped focus of ground-glass attenuation in the periphery of the left upper lobe which is slightly nodular in appearance measuring 18 x 13 mm (image 2 of series 3), which appears to have internal air bronchograms, and retraction of the overlying pleura. Mild diffuse bronchial wall thickening with mild to moderate centrilobular and mild paraseptal emphysema. No pleural effusions at this time.  Upper Abdomen: There  are 2 new hepatic lesions, both of which measure intermediate attenuation (39-41 HU), concerning for potential metastatic disease. Specifically, these measure  approximately 1.6 x 1.5 cm in segment 8 of the liver (image 55 of series 2), and approximately 1.3 x 1.2 cm in segment 4 of the liver (image 61 of series 2). Atherosclerosis.  Musculoskeletal: There are no aggressive appearing lytic or blastic lesions noted in the visualized portions of the skeleton.  IMPRESSION: 1. New 4.0 x 2.8 cm mass like opacity in the posterior aspect of the left lower lobe concerning for a potential primary bronchogenic carcinoma. There is a possibility that this could be an infectious area of consolidation, however, this is not strongly favored based on today's imaging features observed. Additionally, the presence of new intermediate attenuation liver lesions raises concern for potential metastatic disease. Correlation with PET-CT and/or biopsy is recommended at this time. 2. In addition, there is a pleural based subsolid nodule in the periphery of the left upper lobe measuring 18 x 13 mm. This is nonspecific, and could be either infectious/ inflammatory, or a separate neoplasm such is a small adenocarcinoma. Attention on followup studies is recommended. 3. Atherosclerosis, including left main and 2 vessel coronary artery disease. Assessment for potential risk factor modification, dietary therapy or pharmacologic therapy may be warranted, if clinically indicated. 4. Mild diffuse bronchial wall thickening with emphysematous changes in the lungs, as above; imaging findings suggestive of underlying COPD. 5. Mild cardiomegaly.   Electronically Signed   By: Vinnie Langton M.D.   On: 06/25/2013 12:13    PATHOLOGY: Arrangements have been made for CT guided biopsy of either the liver or the lung lesion at [facility] on 07/10/2013. Induced sputum will be obtained today for cytology since the yield is quite high if the hemoptysis is a result of a primary bronchogenic carcinoma.   IMPRESSION:  #1. Probable bronchogenic carcinoma with liver metastases. #2. Chronic obstructive  pulmonary disease. #3. Gastroesophageal reflux disease. #4. Hypertension, controlled. #5. Hypothyroidism, on treatment. #6. Anemia.   PLAN:  #1. PET/CT scan to stage, induced sputum for cytology. #2. In the presence of her 2 daughters, the patient was reassured. Her performance status is good and she should respond reasonably well to any intervention which would be based upon extent of disease as well as histology. Molecular analysis will also be done to ensure that epidermal growth factor receptor mutations and ALK mutation are not present if adenocarcinoma is diagnosed.. Treatment could either be a simple tablet or intravenous chemotherapy. #3. Because  hemoptysis is not massive, will not refer for radiotherapy at this time but the patient was warned should hemoptysis become more severe to call immediately so that palliation could be afforded with external beam radiotherapy. #4. Followup in 10 days for discussion of tissue diagnosis, extent of disease, and therapy.  I appreciate the opportunity of sharing in her care.   Doroteo Bradford, MD 07/02/2013 11:55 AM

## 2013-07-02 NOTE — Patient Instructions (Signed)
..  [facility] Discharge Instructions  RECOMMENDATIONS MADE BY THE CONSULTANT AND ANY TEST RESULTS WILL BE SENT TO YOUR REFERRING PHYSICIAN.  EXAM FINDINGS BY THE PHYSICIAN TODAY AND SIGNS OR SYMPTOMS TO REPORT TO CLINIC OR PRIMARY PHYSICIAN: Exam and findings as discussed by Dr. Barnet Glasgow.Marland Kitchen Sputum culture to be obtained today Labs today  INSTRUCTIONS/FOLLOW-UP: We are going to try to get PET scan scheduled   Biopsy on Monday as scheduled. Return to see [place] in 10 days   Thank you for choosing [facility] to provide your oncology and hematology care.  To afford each patient quality time with our providers, please arrive at least 15 minutes before your scheduled appointment time.  With your help, our goal is to use those 15 minutes to complete the necessary work-up to ensure our physicians have the information they need to help with your evaluation and healthcare recommendations.    Effective January 1st, 2014, we ask that you re-schedule your appointment with our physicians should you arrive 10 or more minutes late for your appointment.  We strive to give you quality time with our providers, and arriving late affects you and other patients whose appointments are after yours.    Again, thank you for choosing [facility].  Our hope is that these requests will decrease the amount of time that you wait before being seen by our physicians.       _____________________________________________________________  Should you have questions after your visit to [facility], please contact our office at [phone] between the hours of 8:30 a.m. and 5:00 p.m.  Voicemails left after 4:30 p.m. will not be returned until the following business day.  For prescription refill requests, have your pharmacy contact our office with your prescription refill request.

## 2013-07-07 ENCOUNTER — Ambulatory Visit (HOSPITAL_COMMUNITY)
Admission: RE | Admit: 2013-07-07 | Discharge: 2013-07-07 | Disposition: A | Discharge status: Home / Self Care | Payer: Medicare Other | Source: Ambulatory Visit | Attending: Interventional Radiology | Admitting: Interventional Radiology

## 2013-07-07 ENCOUNTER — Other Ambulatory Visit (HOSPITAL_COMMUNITY): Payer: Self-pay | Admitting: Interventional Radiology

## 2013-07-07 ENCOUNTER — Other Ambulatory Visit (HOSPITAL_COMMUNITY): Payer: Self-pay | Admitting: Hematology and Oncology

## 2013-07-07 ENCOUNTER — Other Ambulatory Visit (HOSPITAL_COMMUNITY): Payer: Self-pay | Admitting: Diagnostic Radiology

## 2013-07-07 ENCOUNTER — Encounter (HOSPITAL_COMMUNITY): Payer: Self-pay

## 2013-07-07 ENCOUNTER — Other Ambulatory Visit: Payer: Self-pay | Admitting: Radiology

## 2013-07-07 DIAGNOSIS — K8020 Calculus of gallbladder without cholecystitis without obstruction: Secondary | ICD-10-CM | POA: Insufficient documentation

## 2013-07-07 DIAGNOSIS — I2510 Atherosclerotic heart disease of native coronary artery without angina pectoris: Secondary | ICD-10-CM | POA: Insufficient documentation

## 2013-07-07 DIAGNOSIS — R160 Hepatomegaly, not elsewhere classified: Secondary | ICD-10-CM

## 2013-07-07 DIAGNOSIS — I739 Peripheral vascular disease, unspecified: Secondary | ICD-10-CM | POA: Diagnosis not present

## 2013-07-07 DIAGNOSIS — J984 Other disorders of lung: Secondary | ICD-10-CM | POA: Diagnosis not present

## 2013-07-07 DIAGNOSIS — Z538 Procedure and treatment not carried out for other reasons: Secondary | ICD-10-CM | POA: Diagnosis not present

## 2013-07-07 DIAGNOSIS — Z01812 Encounter for preprocedural laboratory examination: Secondary | ICD-10-CM | POA: Insufficient documentation

## 2013-07-07 DIAGNOSIS — R918 Other nonspecific abnormal finding of lung field: Secondary | ICD-10-CM

## 2013-07-07 DIAGNOSIS — E785 Hyperlipidemia, unspecified: Secondary | ICD-10-CM | POA: Diagnosis not present

## 2013-07-07 DIAGNOSIS — I10 Essential (primary) hypertension: Secondary | ICD-10-CM | POA: Diagnosis not present

## 2013-07-07 DIAGNOSIS — E039 Hypothyroidism, unspecified: Secondary | ICD-10-CM | POA: Diagnosis not present

## 2013-07-07 DIAGNOSIS — K769 Liver disease, unspecified: Secondary | ICD-10-CM

## 2013-07-07 DIAGNOSIS — K7689 Other specified diseases of liver: Secondary | ICD-10-CM | POA: Insufficient documentation

## 2013-07-07 DIAGNOSIS — F17200 Nicotine dependence, unspecified, uncomplicated: Secondary | ICD-10-CM | POA: Insufficient documentation

## 2013-07-07 DIAGNOSIS — K219 Gastro-esophageal reflux disease without esophagitis: Secondary | ICD-10-CM | POA: Diagnosis not present

## 2013-07-07 LAB — CBC
HEMATOCRIT: 31.1 % — AB (ref 36.0–46.0)
Hemoglobin: 10.5 g/dL — ABNORMAL LOW (ref 12.0–15.0)
MCH: 32.9 pg (ref 26.0–34.0)
MCHC: 33.8 g/dL (ref 30.0–36.0)
MCV: 97.5 fL (ref 78.0–100.0)
Platelets: 289 10*3/uL (ref 150–400)
RBC: 3.19 MIL/uL — ABNORMAL LOW (ref 3.87–5.11)
RDW: 14.2 % (ref 11.5–15.5)
WBC: 4.4 10*3/uL (ref 4.0–10.5)

## 2013-07-07 LAB — APTT: APTT: 41 s — AB (ref 24–37)

## 2013-07-07 LAB — PROTIME-INR
INR: 1.09 (ref 0.00–1.49)
PROTHROMBIN TIME: 13.9 s (ref 11.6–15.2)

## 2013-07-07 MED ORDER — MIDAZOLAM HCL 2 MG/2ML IJ SOLN
INTRAMUSCULAR | Status: AC
  Filled 2013-07-07: qty 4

## 2013-07-07 MED ORDER — FENTANYL CITRATE 0.05 MG/ML IJ SOLN
INTRAMUSCULAR | Status: AC
  Filled 2013-07-07: qty 2

## 2013-07-07 MED ORDER — SODIUM CHLORIDE 0.9 % IV SOLN
Freq: Once | INTRAVENOUS | Status: AC
  Administered 2013-07-07: 09:00:00 via INTRAVENOUS

## 2013-07-07 NOTE — H&P (Signed)
Chief Complaint: "I'm here for a biopsy"  HPI: [name] is an 79 y.o. female with newly found lung mass and CT also noted liver lesions concerning for mets. She is scheduled today for biopsy of one of the liver lesions. PMHx and meds reviewed. Pt feels ok, no recent fevers, illness, CP, SOB  Past Medical History:  Past Medical History  Diagnosis Date  . Hypertension   . Hypothyroidism   . Hyperlipidemia   . Insomnia   . GERD (gastroesophageal reflux disease)     chronic gastritis  . Adrenal adenoma   . Chronic diarrhea   . Fatty liver   . Arthritis     knee  . QT prolongation     syncope  . Coronary artery disease   . Impaired fasting glucose   . Renal insufficiency     low protien diet  . Tobacco use     1/2 ppd, approx 25-50 pack years (as of 08/2012)  . Family history of heart disease   . IBS (irritable bowel syndrome)   . Peripheral arterial disease     sstatus post  infrarenal abdominal aortic tube graft placed by Dr. Victorino Dike February 2008    Past Surgical History:  Past Surgical History  Procedure Laterality Date  . Abdominal hysterectomy    . Thyroidectomy, partial    . Colonoscopy  5/04    normal  . Abdominal aortic aneurysm repair  07/2006    D. J.D. Kellie Simmering  . Cataract extraction Bilateral 2010  . Cardiac catheterization  02/2010    mod CAD in L-dominant system with normal LV function  . Colon resection  2005    1/2 colon removed  . Transthoracic echocardiogram  02/2010    EF 55-60%; mild conc LVH, normal systolic function; mildly calcified AV annulus  . Colonoscopy with esophagogastroduodenoscopy (egd) N/A 03/19/2013    Procedure: COLONOSCOPY WITH ESOPHAGOGASTRODUODENOSCOPY (EGD);  Surgeon: Rogene Houston, MD;  Location: [facility];  Service: Endoscopy;  Laterality: N/A;  145    Family History:  Family History  Problem Relation Age of Onset  . Hypertension Mother   . Diabetes Mother   . Heart attack Mother   . Hyperlipidemia Sister     x3  sister  . Kidney disease Brother     Social History:  reports that she has been smoking Cigarettes.  She has a 29.5 pack-year smoking history. She has never used smokeless tobacco. She reports that she does not drink alcohol or use illicit drugs.  Allergies:  Allergies  Allergen Reactions  . Aleve [Naproxen Sodium] Other (See Comments)    GI bleed  . Dilaudid [Hydromorphone Hcl]     Can not tolerate  . Dyazide [Hydrochlorothiazide W-Triamterene] Other (See Comments)    cramps  . Zithromax [Azithromycin] Itching    Medications:   Medication List    ASK your doctor about these medications       allopurinol 300 MG tablet  Commonly known as:  ZYLOPRIM  TAKE ONE TABLET BY MOUTH ONCE DAILY     ALPRAZolam 0.5 MG tablet  Commonly known as:  XANAX  Take 0.5 mg by mouth. One tab qid as needed     amLODipine 10 MG tablet  Commonly known as:  NORVASC  TAKE ONE TABLET AT BEDTIME (STOP VERAPAMIL)     aspirin 81 MG tablet  Take 81 mg by mouth daily.     Cholecalciferol 1000 UNITS capsule  Take 1,000 Units by mouth daily.  enalapril 20 MG tablet  Commonly known as:  VASOTEC  Take 1 tablet (20 mg total) by mouth daily.     fenofibrate 160 MG tablet  TAKE ONE TABLET BY MOUTH ONCE DAILY WITH FOOD     ferrous sulfate 325 (65 FE) MG tablet  Commonly known as:  FERROUSUL  Take 1 tablet (325 mg total) by mouth 2 (two) times daily after a meal.     furosemide 20 MG tablet  Commonly known as:  LASIX  Take 1 tablet (20 mg total) by mouth daily.     hydrALAZINE 25 MG tablet  Commonly known as:  APRESOLINE     hydrOXYzine 25 MG tablet  Commonly known as:  ATARAX/VISTARIL  Take 1 tablet (25 mg total) by mouth 3 (three) times daily as needed for itching.     levofloxacin 500 MG tablet  Commonly known as:  LEVAQUIN  Take 1 tablet (500 mg total) by mouth daily.     levothyroxine 137 MCG tablet  Commonly known as:  SYNTHROID, LEVOTHROID  take 1 tablet by mouth once daily      loperamide 2 MG capsule  Commonly known as:  IMODIUM  Take 2 mg by mouth 4 (four) times daily as needed for diarrhea or loose stools.     magnesium oxide 400 MG tablet  Commonly known as:  MAG-OX  Take 750 mg by mouth daily.     NEXIUM 40 MG capsule  Generic drug:  esomeprazole  take 1 capsule by mouth once daily     ondansetron 8 MG tablet  Commonly known as:  ZOFRAN  Take 1 tablet (8 mg total) by mouth every 8 (eight) hours as needed for nausea.     PARoxetine 30 MG tablet  Commonly known as:  PAXIL  TAKE ONE TABLET BY MOUTH ONCE DAILY     pravastatin 20 MG tablet  Commonly known as:  PRAVACHOL  TAKE ONE TABLET BY MOUTH ONCE DAILY     traMADol 50 MG tablet  Commonly known as:  ULTRAM  Take 1 tablet (50 mg total) by mouth every 6 (six) hours as needed.     zolpidem 10 MG tablet  Commonly known as:  AMBIEN  Take 10 mg by mouth at bedtime as needed for sleep.        Please HPI for pertinent positives, otherwise complete 10 system ROS negative.  Physical Exam: BP 133/67  Pulse 63  Temp(Src) 98.7 F (37.1 C) (Oral)  Resp 20  Ht 5\' 7"  (1.702 m)  Wt 171 lb (77.565 kg)  BMI 26.78 kg/m2  SpO2 96% Body mass index is 26.78 kg/(m^2).   General Appearance:  Alert, cooperative, no distress, appears stated age  Head:  Normocephalic, without obvious abnormality, atraumatic  ENT: Unremarkable  Neck: Supple, symmetrical, trachea midline  Lungs:   Clear to auscultation bilaterally, no w/r/r, respirations unlabored without use of accessory muscles.  Chest Wall:  No tenderness or deformity  Heart:  Regular rate and rhythm, S1, S2 normal, no murmur, rub or gallop.  Abdomen:   Soft, non-tender, non distended.  Neurologic: Normal affect, no gross deficits.   No results found for this or any previous visit (from the past 48 hour(s)). No results found.  Assessment/Plan Lung mass with liver lesions. For US guided biopsy of liver lesion. Explained procedure, risks,  complications, use of sedation. Labs pending Consent signed in chart  Ascencion Dike PA-C 07/07/2013, 9:09 AM

## 2013-07-09 ENCOUNTER — Ambulatory Visit (HOSPITAL_COMMUNITY)
Admission: RE | Admit: 2013-07-09 | Discharge: 2013-07-09 | Disposition: A | Discharge status: Home / Self Care | Payer: Medicare Other | Source: Ambulatory Visit | Attending: Hematology and Oncology | Admitting: Hematology and Oncology

## 2013-07-09 DIAGNOSIS — I700 Atherosclerosis of aorta: Secondary | ICD-10-CM | POA: Insufficient documentation

## 2013-07-09 DIAGNOSIS — I2510 Atherosclerotic heart disease of native coronary artery without angina pectoris: Secondary | ICD-10-CM | POA: Insufficient documentation

## 2013-07-09 DIAGNOSIS — K7689 Other specified diseases of liver: Secondary | ICD-10-CM | POA: Diagnosis not present

## 2013-07-09 DIAGNOSIS — R599 Enlarged lymph nodes, unspecified: Secondary | ICD-10-CM | POA: Diagnosis not present

## 2013-07-09 DIAGNOSIS — R911 Solitary pulmonary nodule: Secondary | ICD-10-CM | POA: Insufficient documentation

## 2013-07-09 DIAGNOSIS — J3489 Other specified disorders of nose and nasal sinuses: Secondary | ICD-10-CM | POA: Diagnosis not present

## 2013-07-09 DIAGNOSIS — R222 Localized swelling, mass and lump, trunk: Secondary | ICD-10-CM | POA: Diagnosis not present

## 2013-07-09 DIAGNOSIS — I517 Cardiomegaly: Secondary | ICD-10-CM | POA: Insufficient documentation

## 2013-07-09 DIAGNOSIS — R042 Hemoptysis: Secondary | ICD-10-CM

## 2013-07-09 DIAGNOSIS — J438 Other emphysema: Secondary | ICD-10-CM | POA: Diagnosis not present

## 2013-07-09 DIAGNOSIS — D3500 Benign neoplasm of unspecified adrenal gland: Secondary | ICD-10-CM | POA: Diagnosis not present

## 2013-07-09 DIAGNOSIS — C3430 Malignant neoplasm of lower lobe, unspecified bronchus or lung: Secondary | ICD-10-CM | POA: Diagnosis not present

## 2013-07-09 DIAGNOSIS — R918 Other nonspecific abnormal finding of lung field: Secondary | ICD-10-CM

## 2013-07-09 LAB — GLUCOSE, CAPILLARY: GLUCOSE-CAPILLARY: 89 mg/dL (ref 70–99)

## 2013-07-09 MED ORDER — FLUDEOXYGLUCOSE F - 18 (FDG) INJECTION
16.6000 | Freq: Once | INTRAVENOUS | Status: AC | PRN
  Administered 2013-07-09: 16.6 via INTRAVENOUS

## 2013-07-10 ENCOUNTER — Other Ambulatory Visit: Payer: Self-pay | Admitting: Family Medicine

## 2013-07-11 ENCOUNTER — Ambulatory Visit (HOSPITAL_COMMUNITY): Payer: Medicare Other

## 2013-07-14 ENCOUNTER — Other Ambulatory Visit: Payer: Self-pay | Admitting: Family Medicine

## 2013-07-14 ENCOUNTER — Encounter: Payer: Self-pay | Admitting: Family Medicine

## 2013-07-14 ENCOUNTER — Ambulatory Visit (INDEPENDENT_AMBULATORY_CARE_PROVIDER_SITE_OTHER): Payer: Medicare Other | Admitting: Family Medicine

## 2013-07-14 VITALS — BP 118/64 | Temp 98.9°F | Ht 67.0 in | Wt 165.0 lb

## 2013-07-14 DIAGNOSIS — R918 Other nonspecific abnormal finding of lung field: Secondary | ICD-10-CM | POA: Diagnosis not present

## 2013-07-14 DIAGNOSIS — I10 Essential (primary) hypertension: Secondary | ICD-10-CM

## 2013-07-14 DIAGNOSIS — G4700 Insomnia, unspecified: Secondary | ICD-10-CM | POA: Diagnosis not present

## 2013-07-14 DIAGNOSIS — J209 Acute bronchitis, unspecified: Secondary | ICD-10-CM

## 2013-07-14 MED ORDER — AMOXICILLIN 500 MG PO TABS: ORAL_TABLET | ORAL | Status: AC

## 2013-07-14 MED ORDER — ONDANSETRON HCL 8 MG PO TABS: 8.0000 mg | ORAL_TABLET | Freq: Three times a day (TID) | ORAL | Status: DC | PRN

## 2013-07-14 MED ORDER — AMOXICILLIN 500 MG PO TABS: 500.0000 mg | ORAL_TABLET | Freq: Two times a day (BID) | ORAL | Status: DC

## 2013-07-14 NOTE — Telephone Encounter (Signed)
Ok plus five montyhly ref

## 2013-07-14 NOTE — Progress Notes (Signed)
   Subjective:    Patient ID: [name], female    DOB: 1934-08-29, 79 y.o.   MRN: [id number]  Cough This is a new problem. The current episode started 1 to 4 weeks ago. Associated symptoms include headaches. Associated symptoms comments: Nausea, vomiting.   Right shoulder pain. Started a few weeks ago. Worse with certain motions. Patient does not recall an acute injury.  Planned t0 biopsy the liver that was cancelled, because PET scan suggested no metastasis in the liver   Pos phlegm. No longer experiencing hemoptysis  Hot and cold no sig fever at that time   Review of Systems  Respiratory: Positive for cough.   Neurological: Positive for headaches.   some nausea no chest pain no abdominal pain ROS otherwise negative     Objective:   Physical Exam  Alert somewhat anxious appearing. HEENT slight nasal congestion. Lungs no wheezes no tachypnea no crackles bronchial cough during exam heart regular rate and rhythm. Ankles without edema      Assessment & Plan:  Impression 1 acute bronchitis discussed #2 lung cancer discussed at length. Patient due to see oncologist after chest biopsy, at that time they will discuss with the patient her options. Good news that it is not in the liver, though this is still a very such serious situation. Discussed with patient and family #3 anxiety ongoing. #4 nausea challenge also, and unfortunately insurance not covering generic Zofran at this time. We will work on getting this approved. Plan antibiotics prescribed as noted easily 25 minutes with patient most in discussion. WSL

## 2013-07-14 NOTE — Patient Instructions (Signed)
Robitussin DM as needed for cough two tspns up to four times per day

## 2013-07-15 ENCOUNTER — Other Ambulatory Visit (HOSPITAL_COMMUNITY): Payer: Self-pay | Admitting: Radiology

## 2013-07-16 ENCOUNTER — Ambulatory Visit (HOSPITAL_COMMUNITY)
Admission: RE | Admit: 2013-07-16 | Discharge: 2013-07-16 | Disposition: A | Discharge status: Home / Self Care | Payer: Medicare Other | Source: Ambulatory Visit | Attending: Diagnostic Radiology | Admitting: Diagnostic Radiology

## 2013-07-16 ENCOUNTER — Encounter (HOSPITAL_COMMUNITY): Payer: Self-pay

## 2013-07-16 ENCOUNTER — Ambulatory Visit (HOSPITAL_COMMUNITY)
Admission: RE | Admit: 2013-07-16 | Discharge: 2013-07-16 | Disposition: A | Discharge status: Home / Self Care | Payer: Medicare Other | Source: Ambulatory Visit | Attending: Hematology and Oncology | Admitting: Hematology and Oncology

## 2013-07-16 VITALS — BP 159/68 | HR 67 | Temp 98.4°F | Resp 20 | Ht 67.0 in | Wt 171.0 lb

## 2013-07-16 DIAGNOSIS — R911 Solitary pulmonary nodule: Secondary | ICD-10-CM | POA: Insufficient documentation

## 2013-07-16 DIAGNOSIS — I10 Essential (primary) hypertension: Secondary | ICD-10-CM | POA: Insufficient documentation

## 2013-07-16 DIAGNOSIS — E039 Hypothyroidism, unspecified: Secondary | ICD-10-CM | POA: Insufficient documentation

## 2013-07-16 DIAGNOSIS — K219 Gastro-esophageal reflux disease without esophagitis: Secondary | ICD-10-CM | POA: Insufficient documentation

## 2013-07-16 DIAGNOSIS — F17200 Nicotine dependence, unspecified, uncomplicated: Secondary | ICD-10-CM | POA: Diagnosis not present

## 2013-07-16 DIAGNOSIS — R222 Localized swelling, mass and lump, trunk: Secondary | ICD-10-CM | POA: Diagnosis not present

## 2013-07-16 DIAGNOSIS — J984 Other disorders of lung: Secondary | ICD-10-CM | POA: Insufficient documentation

## 2013-07-16 DIAGNOSIS — K7689 Other specified diseases of liver: Secondary | ICD-10-CM | POA: Diagnosis not present

## 2013-07-16 DIAGNOSIS — R05 Cough: Secondary | ICD-10-CM | POA: Insufficient documentation

## 2013-07-16 DIAGNOSIS — C3490 Malignant neoplasm of unspecified part of unspecified bronchus or lung: Secondary | ICD-10-CM | POA: Insufficient documentation

## 2013-07-16 DIAGNOSIS — R059 Cough, unspecified: Secondary | ICD-10-CM | POA: Diagnosis not present

## 2013-07-16 DIAGNOSIS — R918 Other nonspecific abnormal finding of lung field: Secondary | ICD-10-CM

## 2013-07-16 DIAGNOSIS — C3430 Malignant neoplasm of lower lobe, unspecified bronchus or lung: Secondary | ICD-10-CM | POA: Diagnosis not present

## 2013-07-16 DIAGNOSIS — E785 Hyperlipidemia, unspecified: Secondary | ICD-10-CM | POA: Insufficient documentation

## 2013-07-16 DIAGNOSIS — R5381 Other malaise: Secondary | ICD-10-CM | POA: Diagnosis not present

## 2013-07-16 DIAGNOSIS — R5383 Other fatigue: Secondary | ICD-10-CM

## 2013-07-16 DIAGNOSIS — M25519 Pain in unspecified shoulder: Secondary | ICD-10-CM | POA: Insufficient documentation

## 2013-07-16 DIAGNOSIS — I2510 Atherosclerotic heart disease of native coronary artery without angina pectoris: Secondary | ICD-10-CM | POA: Insufficient documentation

## 2013-07-16 LAB — PROTIME-INR
INR: 1.03 (ref 0.00–1.49)
Prothrombin Time: 13.3 seconds (ref 11.6–15.2)

## 2013-07-16 LAB — CBC
HCT: 33.2 % — ABNORMAL LOW (ref 36.0–46.0)
Hemoglobin: 10.9 g/dL — ABNORMAL LOW (ref 12.0–15.0)
MCH: 32.5 pg (ref 26.0–34.0)
MCHC: 32.8 g/dL (ref 30.0–36.0)
MCV: 99.1 fL (ref 78.0–100.0)
PLATELETS: 236 10*3/uL (ref 150–400)
RBC: 3.35 MIL/uL — ABNORMAL LOW (ref 3.87–5.11)
RDW: 14 % (ref 11.5–15.5)
WBC: 4.5 10*3/uL (ref 4.0–10.5)

## 2013-07-16 LAB — APTT: aPTT: 41 seconds — ABNORMAL HIGH (ref 24–37)

## 2013-07-16 MED ORDER — SODIUM CHLORIDE 0.9 % IV SOLN
Freq: Once | INTRAVENOUS | Status: AC
Start: 2013-07-16 — End: 2013-07-16
  Administered 2013-07-16: 08:00:00 via INTRAVENOUS

## 2013-07-16 MED ORDER — MIDAZOLAM HCL 2 MG/2ML IJ SOLN
INTRAMUSCULAR | Status: AC
  Filled 2013-07-16: qty 4

## 2013-07-16 MED ORDER — LIDOCAINE HCL 1 % IJ SOLN
INTRAMUSCULAR | Status: AC
  Filled 2013-07-16: qty 10

## 2013-07-16 MED ORDER — FENTANYL CITRATE 0.05 MG/ML IJ SOLN
INTRAMUSCULAR | Status: AC | PRN
  Administered 2013-07-16 (×2): 25 ug via INTRAVENOUS

## 2013-07-16 MED ORDER — FENTANYL CITRATE 0.05 MG/ML IJ SOLN
INTRAMUSCULAR | Status: AC
  Filled 2013-07-16: qty 2

## 2013-07-16 MED ORDER — MIDAZOLAM HCL 2 MG/2ML IJ SOLN
INTRAMUSCULAR | Status: AC | PRN
  Administered 2013-07-16 (×2): 1 mg via INTRAVENOUS

## 2013-07-16 NOTE — H&P (Signed)
[name] is an 79 y.o. female.   Chief Complaint: pt developed cold like symptoms and shoulder pain for weeks Work up revealed abn CXR- Left  Lung lesions  CT confirms same and liver lesions seen Liver lesion bx was scheduled for 07/07/13 but upon examination these lesions were not confidently identified and no biopsy performed Rec: PET Left lower lobe lung mass +PET with L hilar and mediastinal Lymphadenopathy Now scheduled for LLL lung mass biopsy  HPI: HTN; smoker; hypothyroid; HLD; GERD; adrenal adenoma; fatty liver; CAD; PAD  Past Medical History  Diagnosis Date  . Hypertension   . Hypothyroidism   . Hyperlipidemia   . Insomnia   . GERD (gastroesophageal reflux disease)     chronic gastritis  . Adrenal adenoma   . Chronic diarrhea   . Fatty liver   . Arthritis     knee  . QT prolongation     syncope  . Coronary artery disease   . Impaired fasting glucose   . Renal insufficiency     low protien diet  . Tobacco use     1/2 ppd, approx 25-50 pack years (as of 08/2012)  . Family history of heart disease   . IBS (irritable bowel syndrome)   . Peripheral arterial disease     sstatus post  infrarenal abdominal aortic tube graft placed by Dr. Victorino Dike February 2008    Past Surgical History  Procedure Laterality Date  . Abdominal hysterectomy    . Thyroidectomy, partial    . Colonoscopy  5/04    normal  . Abdominal aortic aneurysm repair  07/2006    D. J.D. Kellie Simmering  . Cataract extraction Bilateral 2010  . Cardiac catheterization  02/2010    mod CAD in L-dominant system with normal LV function  . Colon resection  2005    1/2 colon removed  . Transthoracic echocardiogram  02/2010    EF 55-60%; mild conc LVH, normal systolic function; mildly calcified AV annulus  . Colonoscopy with esophagogastroduodenoscopy (egd) N/A 03/19/2013    Procedure: COLONOSCOPY WITH ESOPHAGOGASTRODUODENOSCOPY (EGD);  Surgeon: Rogene Houston, MD;  Location: [facility];  Service: Endoscopy;   Laterality: N/A;  145    Family History  Problem Relation Age of Onset  . Hypertension Mother   . Diabetes Mother   . Heart attack Mother   . Hyperlipidemia Sister     x3 sister  . Kidney disease Brother    Social History:  reports that she has been smoking Cigarettes.  She has a 29.5 pack-year smoking history. She has never used smokeless tobacco. She reports that she does not drink alcohol or use illicit drugs.  Allergies:  Allergies  Allergen Reactions  . Aleve [Naproxen Sodium] Other (See Comments)    GI bleed  . Dilaudid [Hydromorphone Hcl]     Can not tolerate  . Dyazide [Hydrochlorothiazide W-Triamterene] Other (See Comments)    cramps  . Zithromax [Azithromycin] Itching     (Not in a hospital admission)  Results for orders placed during the hospital encounter of 07/16/13 (from the past 48 hour(s))  APTT     Status: Abnormal   Collection Time    07/16/13  7:58 AM      Result Value Range   aPTT 41 (*) 24 - 37 seconds   Comment:            IF BASELINE aPTT IS ELEVATED,     SUGGEST PATIENT RISK ASSESSMENT     BE USED  TO DETERMINE APPROPRIATE     ANTICOAGULANT THERAPY.  CBC     Status: Abnormal   Collection Time    07/16/13  7:58 AM      Result Value Range   WBC 4.5  4.0 - 10.5 K/uL   RBC 3.35 (*) 3.87 - 5.11 MIL/uL   Hemoglobin 10.9 (*) 12.0 - 15.0 g/dL   HCT 33.2 (*) 36.0 - 46.0 %   MCV 99.1  78.0 - 100.0 fL   MCH 32.5  26.0 - 34.0 pg   MCHC 32.8  30.0 - 36.0 g/dL   RDW 14.0  11.5 - 15.5 %   Platelets 236  150 - 400 K/uL  PROTIME-INR     Status: None   Collection Time    07/16/13  7:58 AM      Result Value Range   Prothrombin Time 13.3  11.6 - 15.2 seconds   INR 1.03  0.00 - 1.49   No results found.  Review of Systems  Constitutional: Positive for malaise/fatigue. Negative for fever and chills.  Respiratory: Positive for cough and sputum production.   Cardiovascular: Negative for chest pain.  Gastrointestinal: Positive for diarrhea. Negative for  nausea, vomiting and abdominal pain.  Musculoskeletal: Positive for joint pain.       Shoulder pain  Neurological: Positive for weakness. Negative for dizziness.  Psychiatric/Behavioral: Positive for substance abuse.       Smoker    Blood pressure 144/64, pulse 51, temperature 98.5 F (36.9 C), temperature source Oral, resp. rate 18, height 5\' 7"  (1.702 m), weight 171 lb (77.565 kg), SpO2 97.00%. Physical Exam  Constitutional: She is oriented to person, place, and time. She appears well-developed and well-nourished.  Cardiovascular: Normal rate, regular rhythm and normal heart sounds.   No murmur heard. Respiratory: Effort normal and breath sounds normal. She has no wheezes.  GI: Soft. Bowel sounds are normal. There is no tenderness.  Musculoskeletal: Normal range of motion.  Neurological: She is alert and oriented to person, place, and time.  Skin: Skin is warm and dry.  Psychiatric: She has a normal mood and affect. Her behavior is normal. Judgment and thought content normal.     Assessment/Plan Left lung mass; +PET Scheduled for lung mass biopsy today Pt is aware of procedure benefits and risks and agreeable to proceed Consent signed and in chart  Vicktoria Muckey A 07/16/2013, 8:41 AM

## 2013-07-16 NOTE — ED Notes (Signed)
Short stay not available yet

## 2013-07-16 NOTE — Procedures (Signed)
CT guided core biopsies of left lower lobe nodule.  2 cores obtained.  No immediate complication.

## 2013-07-16 NOTE — Discharge Instructions (Signed)
Needle Biopsy of Lung, Care After Refer to this sheet in the next few weeks. These instructions provide you with information on caring for yourself after your procedure. Your health care provider may also give you more specific instructions. Your treatment has been planned according to current medical practices, but problems sometimes occur. Call your health care provider if you have any problems or questions after your procedure. WHAT TO EXPECT AFTER THE PROCEDURE A bandage will be applied over the areas where the needle was inserted. You may be asked to apply pressure to the bandage for several minutes to ensure there is minimal bleeding. In most cases, you can leave when your needle biopsy procedure is completed. Do not drive yourself home. Someone else should take you home. If you received an IV sedative or general anesthetic, you will be taken to a comfortable place to relax while the medication wears off. If you have upcoming travel scheduled, talk to your doctor about when it is safe to travel by air after the procedure. HOME CARE INSTRUCTIONS Expect to take it easy for the rest of the day. Protect the area where you received the needle biopsy by keeping the bandage in place for as long as instructed. You may feel some mild pain or discomfort in the area, but this should stop in a day or two. Only take over-the-counter or prescription medicines for pain, discomfort, or fever as directed by your caregiver. SEEK MEDICAL CARE IF:   You have pain at the biopsy site that worsens or is not helped by medication.  You have swelling or drainage at the needle biopsy site.  You have a fever. SEEK IMMEDIATE MEDICAL CARE IF:   You have new or worsening shortness of breath.  You have chest pain.  You are coughing up blood.  You have bleeding that does not stop with pressure or a bandage.  You develop light-headedness or fainting. Document Released: 04/09/2007 Document Revised: 02/12/2013 Document  Reviewed: 11/04/2012 [facility] Patient Information 2014 [place].

## 2013-07-18 ENCOUNTER — Other Ambulatory Visit: Payer: Self-pay | Admitting: *Deleted

## 2013-07-18 MED ORDER — ALPRAZOLAM 1 MG PO TABS: ORAL_TABLET | ORAL | Status: DC

## 2013-07-25 ENCOUNTER — Encounter (HOSPITAL_BASED_OUTPATIENT_CLINIC_OR_DEPARTMENT_OTHER): Payer: Medicare Other

## 2013-07-25 ENCOUNTER — Encounter (HOSPITAL_COMMUNITY): Payer: Self-pay

## 2013-07-25 VITALS — BP 141/73 | HR 58 | Temp 99.5°F | Resp 18 | Wt 164.9 lb

## 2013-07-25 DIAGNOSIS — C3430 Malignant neoplasm of lower lobe, unspecified bronchus or lung: Secondary | ICD-10-CM | POA: Diagnosis not present

## 2013-07-25 DIAGNOSIS — R042 Hemoptysis: Secondary | ICD-10-CM

## 2013-07-25 DIAGNOSIS — D509 Iron deficiency anemia, unspecified: Secondary | ICD-10-CM

## 2013-07-25 NOTE — Progress Notes (Signed)
[name]  OFFICE PROGRESS NOTE  Leah Battiest, MD [place] [address]  DIAGNOSIS: Lung cancer, lower lobe(Left) Squamous cell - Plan: IR Fluoro Guide CV Line Right, CANCELED: IR Fluoro Guide CV Midline PICC Left  Anemia, iron deficiency  Chief Complaint  Patient presents with   Squamous cell lung cancer at least stage III   Follow-up    CURRENT THERAPY: Status post left lower lobe lung mass biopsy.  INTERVAL HISTORY: [name] 79 y.o. 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 She is here today with many members of her family to discuss the results of her biopsy and plan therapy. She still has occasional hemoptysis with cough. She denies any dysphagia, nausea, vomiting, headache, fever, night sweats, lower extremity swelling or redness, skin rash, joint pain, headache, or seizures.  MEDICAL HISTORY: Past Medical History  Diagnosis Date   Hypertension    Hypothyroidism    Hyperlipidemia    Insomnia    GERD (gastroesophageal reflux disease)     chronic gastritis   Adrenal adenoma    Chronic diarrhea    Fatty liver    Arthritis     knee   QT prolongation     syncope   Coronary artery disease    Impaired fasting glucose    Renal insufficiency     low protien diet   Tobacco use     1/2 ppd, approx 25-50 pack years (as of 08/2012)   Family history of heart disease    IBS (irritable bowel syndrome)    Peripheral arterial disease     sstatus post  infrarenal abdominal aortic tube graft placed by Dr. Victorino Dike February 2008    INTERIM HISTORY: has Unspecified hypothyroidism; Pruritic dermatitis; HTN (hypertension); Chronic renal insufficiency; Other and unspecified hyperlipidemia; Generalized anxiety disorder; Insomnia; Esophageal reflux; Coronary artery disease; Essential  hypertension; Hyperlipidemia; Peripheral arterial disease; Anemia, iron deficiency; Hemoptysis; Mass of lung parenchyma; and Lung mass on her problem list.    ALLERGIES:  is allergic to aleve; dilaudid; dyazide; and zithromax.  MEDICATIONS: has a current medication list which includes the following prescription(s): allopurinol, alprazolam, amlodipine, aspirin ec, cholecalciferol, enalapril, esomeprazole, fenofibrate, ferrous sulfate, furosemide, hydralazine, hydroxyzine, levothyroxine, loperamide, magnesium oxide, ondansetron, paroxetine, pravastatin, tramadol, and zolpidem.  SURGICAL HISTORY:  Past Surgical History  Procedure Laterality Date   Abdominal hysterectomy     Thyroidectomy, partial     Colonoscopy  5/04    normal   Abdominal aortic aneurysm repair  07/2006    D. J.D. Kellie Simmering   Cataract extraction Bilateral 2010   Cardiac catheterization  02/2010    mod CAD in L-dominant system with normal LV function   Colon resection  2005    1/2 colon removed   Transthoracic echocardiogram  02/2010    EF 55-60%; mild conc LVH, normal systolic function; mildly calcified AV annulus   Colonoscopy with esophagogastroduodenoscopy (egd) N/A 03/19/2013    Procedure: COLONOSCOPY WITH ESOPHAGOGASTRODUODENOSCOPY (EGD);  Surgeon: Rogene Houston, MD;  Location: [facility];  Service: Endoscopy;  Laterality: N/A;  145   Dg biopsy lung Left Jan 2015    FAMILY HISTORY: family history includes Diabetes in her mother; Heart attack in her mother; Hyperlipidemia in her sister; Hypertension in her mother; Kidney disease in her brother.  SOCIAL HISTORY:  reports that she has been smoking Cigarettes.  She has a 29.5 pack-year smoking history. She has never used smokeless tobacco. She reports that she does not drink alcohol or use illicit drugs.  REVIEW OF SYSTEMS:  Other than that discussed above is noncontributory.  PHYSICAL EXAMINATION: ECOG PERFORMANCE STATUS: 1 - Symptomatic but completely  ambulatory  Blood pressure 141/73, pulse 58, temperature 99.5 F (37.5 C), temperature source Oral, resp. rate 18, weight 164 lb 14.4 oz (74.798 kg).  GENERAL:alert, no distress and comfortable SKIN: skin color, texture, turgor are normal, no rashes or significant lesions EYES: PERLA; Conjunctiva are pink and non-injected, sclera clear OROPHARYNX:no exudate, no erythema on lips, buccal mucosa, or tongue. NECK: supple, thyroid normal size, non-tender, without nodularity. No masses CHEST: Increased AP diameter with decreased breath sound in the left. No dullness to percussion. LYMPH:  no palpable lymphadenopathy in the cervical, axillary or inguinal LUNGS: clear to auscultation and percussion with normal breathing effort HEART: regular rate & rhythm and no murmurs. ABDOMEN:abdomen soft, non-tender and normal bowel sounds MUSCULOSKELETAL:no cyanosis of digits and no clubbing. Range of motion normal.  NEURO: alert & oriented x 3 with fluent speech, no focal motor/sensory deficits   LABORATORY DATA: Hospital Outpatient Visit on 07/16/2013  Component Date Value Range Status   aPTT 07/16/2013 41* 24 - 37 seconds Final   Comment:                                 IF BASELINE aPTT IS ELEVATED,                          SUGGEST PATIENT RISK ASSESSMENT                          BE USED TO DETERMINE APPROPRIATE                          ANTICOAGULANT THERAPY.   WBC 07/16/2013 4.5  4.0 - 10.5 K/uL Final   RBC 07/16/2013 3.35* 3.87 - 5.11 MIL/uL Final   Hemoglobin 07/16/2013 10.9* 12.0 - 15.0 g/dL Final   HCT 07/16/2013 33.2* 36.0 - 46.0 % Final   MCV 07/16/2013 99.1  78.0 - 100.0 fL Final   MCH 07/16/2013 32.5  26.0 - 34.0 pg Final   MCHC 07/16/2013 32.8  30.0 - 36.0 g/dL Final   RDW 07/16/2013 14.0  11.5 - 15.5 % Final   Platelets 07/16/2013 236  150 - 400 K/uL Final   Prothrombin Time 07/16/2013 13.3  11.6 - 15.2 seconds Final   INR 07/16/2013 1.03  0.00 - 1.49 Final  Hospital  Outpatient Visit on 07/09/2013  Component Date Value Range Status   Glucose-Capillary 07/09/2013 89  70 - 99 mg/dL Final  Hospital Outpatient Visit on 07/07/2013  Component Date Value Range Status   aPTT 07/07/2013 41* 24 - 37 seconds Final   Comment:                                 IF BASELINE aPTT IS ELEVATED,                          SUGGEST PATIENT RISK ASSESSMENT  BE USED TO DETERMINE APPROPRIATE                          ANTICOAGULANT THERAPY.   WBC 07/07/2013 4.4  4.0 - 10.5 K/uL Final   RBC 07/07/2013 3.19* 3.87 - 5.11 MIL/uL Final   Hemoglobin 07/07/2013 10.5* 12.0 - 15.0 g/dL Final   HCT 07/07/2013 31.1* 36.0 - 46.0 % Final   MCV 07/07/2013 97.5  78.0 - 100.0 fL Final   MCH 07/07/2013 32.9  26.0 - 34.0 pg Final   MCHC 07/07/2013 33.8  30.0 - 36.0 g/dL Final   RDW 07/07/2013 14.2  11.5 - 15.5 % Final   Platelets 07/07/2013 289  150 - 400 K/uL Final   Prothrombin Time 07/07/2013 13.9  11.6 - 15.2 seconds Final   INR 07/07/2013 1.09  0.00 - 1.49 Final  Office Visit on 07/02/2013  Component Date Value Range Status   Sodium 07/02/2013 141  137 - 147 mEq/L Final   Potassium 07/02/2013 3.4* 3.7 - 5.3 mEq/L Final   Chloride 07/02/2013 101  96 - 112 mEq/L Final   CO2 07/02/2013 26  19 - 32 mEq/L Final   Glucose, Bld 07/02/2013 105* 70 - 99 mg/dL Final   BUN 07/02/2013 17  6 - 23 mg/dL Final   Creatinine, Ser 07/02/2013 1.86* 0.50 - 1.10 mg/dL Final   Calcium 07/02/2013 9.4  8.4 - 10.5 mg/dL Final   Total Protein 07/02/2013 7.1  6.0 - 8.3 g/dL Final   Albumin 07/02/2013 3.6  3.5 - 5.2 g/dL Final   AST 07/02/2013 25  0 - 37 U/L Final   ALT 07/02/2013 10  0 - 35 U/L Final   Alkaline Phosphatase 07/02/2013 57  39 - 117 U/L Final   Total Bilirubin 07/02/2013 0.4  0.3 - 1.2 mg/dL Final   GFR calc non Af Amer 07/02/2013 25* >90 mL/min Final   GFR calc Af Amer 07/02/2013 29* >90 mL/min Final   Comment: (NOTE)                           The eGFR has been calculated using the CKD EPI equation.                          This calculation has not been validated in all clinical situations.                          eGFR's persistently <90 mL/min signify possible Chronic Kidney                          Disease.   LDH 07/02/2013 123  94 - 250 U/L Final   Prothrombin Time 07/02/2013 13.9  11.6 - 15.2 seconds Final   INR 07/02/2013 1.09  0.00 - 1.49 Final   aPTT 07/02/2013 37  24 - 37 seconds Final   Comment:                                 IF BASELINE aPTT IS ELEVATED,                          SUGGEST PATIENT RISK ASSESSMENT  BE USED TO DETERMINE APPROPRIATE                          ANTICOAGULANT THERAPY.   WBC 07/02/2013 4.9  4.0 - 10.5 K/uL Final   RBC 07/02/2013 3.26* 3.87 - 5.11 MIL/uL Final   Hemoglobin 07/02/2013 10.7* 12.0 - 15.0 g/dL Final   HCT 07/02/2013 32.5* 36.0 - 46.0 % Final   MCV 07/02/2013 99.7  78.0 - 100.0 fL Final   MCH 07/02/2013 32.8  26.0 - 34.0 pg Final   MCHC 07/02/2013 32.9  30.0 - 36.0 g/dL Final   RDW 07/02/2013 14.2  11.5 - 15.5 % Final   Platelets 07/02/2013 278  150 - 400 K/uL Final   Neutrophils Relative % 07/02/2013 58  43 - 77 % Final   Neutro Abs 07/02/2013 2.8  1.7 - 7.7 K/uL Final   Lymphocytes Relative 07/02/2013 29  12 - 46 % Final   Lymphs Abs 07/02/2013 1.4  0.7 - 4.0 K/uL Final   Monocytes Relative 07/02/2013 9  3 - 12 % Final   Monocytes Absolute 07/02/2013 0.4  0.1 - 1.0 K/uL Final   Eosinophils Relative 07/02/2013 4  0 - 5 % Final   Eosinophils Absolute 07/02/2013 0.2  0.0 - 0.7 K/uL Final   Basophils Relative 07/02/2013 1  0 - 1 % Final   Basophils Absolute 07/02/2013 0.1  0.0 - 0.1 K/uL Final    PATHOLOGY: for ROLLYSON, Daleena ([id number]) Patient: Meda Klinefelter Collected: 07/16/2013 Client: [facility] Accession: [id number] Received: 07/16/2013 Markus Daft DOB: [birth year] Age: 5 Gender: F Reported: 07/18/2013 [address] Patient Ph: [phone] MRN #: [id number] [place], [place] [address] Visit #: [id number] Chart #: Phone:  Fax: CC: Farrel Gobble, MD REPORT OF SURGICAL PATHOLOGY FINAL DIAGNOSIS Diagnosis Lung, needle/core biopsy(ies), Left lower lobe - SQUAMOUS CELL CARCINOMA. - SEE COMMENT. Microscopic Comment The malignant cells are positive for cytokeratin 5/6 and p63. They are negative for Napsin-A and TTF1. These findings are consistent with squamous cell carcinoma. Dr. Lanette Hampshire has reviewed the case and concurs with this interpretation. (JBK:kh 07/18/13) Enid Cutter MD Pathologist, Electronic Signature (Case signed 07/18/2013) Specimen Gross and Clinical Information Specimen(s) Obtained: Lung, needle/core biopsy(ies), Left lower lobe Specimen Clinical Information Lung cancer (tl) Gross Received in formalin are two cores of tan white soft tissue, measuring 0.5 cm in length x 0.1 cm in diameter and 0.7 cm in length x 0.1 cm in diameter. The specimen is entirely submitted in one cassette. (KL:caf 07/16/13) Stain(s) used in Diagnosis: The following stain(s) were used in diagnosing the case: CK 5/6, Thyroid Transcription Factor -1, P63, Napsin-A. The control(s) stained appropriately. 1 of 2 FINAL for Sweezy, Doree [phone]) Disclaimer Some of these immunohistochemical stains may have been developed and the performance characteristics determined by [facility]. Some may not have been cleared or approved by the U.S. Food and Drug Administration. The FDA has determined that such clearance or approval is not necessary. This test is used for clinical purposes. It should not be regarded as investigational or for research. This laboratory is certified under the [place] (CLIA-88) as qualified to perform high complexity clinical laboratory testing. Report signed out from the following location(s) Associate Professor and  Interpretation performed at [facility], [place], [place] [address]. CLIA #: [id number],  Urinalysis    Component Value Date/Time   COLORURINE YELLOW 03/20/2010 1405  APPEARANCEUR CLEAR 03/20/2010 1405   LABSPEC 1.015 03/20/2010 1405   PHURINE 6.0 03/20/2010 1405   GLUCOSEU NEGATIVE 03/20/2010 1405   HGBUR SMALL* 03/20/2010 [place] 03/20/2010 [place] 03/20/2010 1405   PROTEINUR NEGATIVE 03/20/2010 1405   UROBILINOGEN 0.2 03/20/2010 1405   NITRITE NEGATIVE 03/20/2010 1405   LEUKOCYTESUR NEGATIVE 03/20/2010 1405    RADIOGRAPHIC STUDIES:   CT Chest Wo Contrast Status: Final result         PACS Images    Show images for CT Chest Wo Contrast         Study Result    CLINICAL DATA: Hemoptysis for the past 2 weeks.  EXAM:  CT CHEST WITHOUT CONTRAST  TECHNIQUE:  Multidetector CT imaging of the chest was performed following the  standard protocol without IV contrast.  COMPARISON: No priors.  FINDINGS:  Mediastinum: Heart size is mildly enlarged. There is no significant  pericardial fluid, thickening or pericardial calcification. There is  atherosclerosis of the thoracic aorta, the great vessels of the  mediastinum and the coronary arteries, including calcified  atherosclerotic plaque in the left main, left anterior descending  and left circumflex coronary arteries. No pathologically enlarged  mediastinal or hilar lymph nodes. Please note that accurate  exclusion of hilar adenopathy is limited on noncontrast CT scans.  Esophagus is unremarkable in appearance.  Lungs/Pleura: New mass like opacity measuring 4.0 x 2.8 cm in the  posterior aspect of the left lower lobe. This lesion has  macrolobulated margins with some spiculations and a small amount of  surrounding the ground-glass attenuation. There is a tiny focus of  internal cavitation associated with this lesion. Some air  bronchograms are noted along the  periphery of the lesion, however,  these appear displaced outward by the lesion suggesting that this is  in fact a mass, rather than airspace disease (although this is not  definitive at this time). This lesion does make contact with the  pleura posteriorly. In addition, there is an irregular shaped focus  of ground-glass attenuation in the periphery of the left upper lobe  which is slightly nodular in appearance measuring 18 x 13 mm (image  2 of series 3), which appears to have internal air bronchograms, and  retraction of the overlying pleura. Mild diffuse bronchial wall  thickening with mild to moderate centrilobular and mild paraseptal  emphysema. No pleural effusions at this time.  Upper Abdomen: There are 2 new hepatic lesions, both of which  measure intermediate attenuation (39-41 HU), concerning for  potential metastatic disease. Specifically, these measure  approximately 1.6 x 1.5 cm in segment 8 of the liver (image 55 of  series 2), and approximately 1.3 x 1.2 cm in segment 4 of the liver  (image 61 of series 2). Atherosclerosis.  Musculoskeletal: There are no aggressive appearing lytic or blastic  lesions noted in the visualized portions of the skeleton.  IMPRESSION:  1. New 4.0 x 2.8 cm mass like opacity in the posterior aspect of the  left lower lobe concerning for a potential primary bronchogenic  carcinoma. There is a possibility that this could be an infectious  area of consolidation, however, this is not strongly favored based  on today's imaging features observed. Additionally, the presence of  new intermediate attenuation liver lesions raises concern for  potential metastatic disease. Correlation with PET-CT and/or biopsy  is recommended at this time.  2. In addition, there is a pleural based subsolid nodule in the  periphery of the left upper lobe measuring 18 x 13 mm. This is  nonspecific, and could be either infectious/ inflammatory, or a  separate neoplasm such  is a small adenocarcinoma. Attention on  followup studies is recommended.  3. Atherosclerosis, including left main and 2 vessel coronary artery  disease. Assessment for potential risk factor modification, dietary  therapy or pharmacologic therapy may be warranted, if clinically  indicated.  4. Mild diffuse bronchial wall thickening with emphysematous changes  in the lungs, as above; imaging findings suggestive of underlying  COPD.  5. Mild cardiomegaly       Dg Chest 1 View  07/16/2013   CLINICAL DATA:  Status post lung biopsy  EXAM: CHEST - 1 VIEW  COMPARISON:  March 20, 2010 chest radiograph; chest CT June 25, 2013  FINDINGS: There is no appreciable pneumothorax. There is consolidation with mass in the left base. Lungs are otherwise clear. Heart is enlarged with normal pulmonary vascularity. No adenopathy. There is atherosclerotic change in the aorta.  IMPRESSION: No apparent pneumothorax.  Mass with consolidation left base.   Electronically Signed   By: Lowella Grip M.D.   On: 07/16/2013 12:43   Nm Pet Image Initial (pi) Skull Base To Thigh  07/09/2013   CLINICAL DATA:  Initial treatment strategy for lung mass.  EXAM: NUCLEAR MEDICINE PET SKULL BASE TO THIGH  FASTING BLOOD GLUCOSE:  Value: 84m/dl  TECHNIQUE: 16.6 mCi F-18 FDG was injected intravenously. CT data was obtained and used for attenuation correction and anatomic localization only. (This was not acquired as a diagnostic CT examination.) Additional exam technical data entered on technologist worksheet.  COMPARISON:  Chest CT 06/25/2013  FINDINGS: NECK  No hypermetabolic lymph nodes in the neck. Low-attenuation lesion in the left maxillary sinus may represent a mucosal retention cyst or polyp.  CHEST  In the posterior aspect of the left lower lobe there is a 4.1 x 3.7 cm mass with macrolobulated margins, central cavitation and large base of contact with the overlying pleura, which is diffusely hypermetabolic (SUVmax =  124.2. There is also an ill-defined nodular ground-glass attenuation opacity in the posterolateral aspect of the left upper lobe in contact with the overlying pleura measuring 18 x 10 mm (image 74 of series 2), without associated hypermetabolism. 1.4 cm short axis left infrahilar lymph node (SUVmax = 7.0). 8 mm AP window lymph node (SUVmax = 5.2). 1 cm short axis low right paratracheal lymph node (SUVmax = 4.4). 11 mm short axis subcarinal lymph node (SUVmax = 4.9). Heart size is mildly enlarged. There is no significant pericardial fluid, thickening or pericardial calcification. There is atherosclerosis of the thoracic aorta, the great vessels of the mediastinum and the coronary arteries, including calcified atherosclerotic plaque in the left main, left anterior descending and left circumflex coronary arteries. Mild moderate centrilobular and paraseptal emphysema, most pronounced throughout the lung apices.  ABDOMEN/PELVIS  No abnormal hypermetabolic activity within the liver, pancreas, right adrenal gland, or spleen. The left adrenal gland is enlarged and low-attenuation (3.3 cm in diameter and 4 HU), demonstrating some diffuse low level metabolic activity (SUVmax = 3.5), only slightly larger than remote prior studies dating back to 09/12/2006, presumably an adenoma. No hypermetabolic lymph nodes in the abdomen or pelvis. Status post right hemicolectomy. Postoperative changes of graft repair of the infrarenal abdominal aorta are noted. No significant volume of ascites. No pneumoperitoneum. No pathologic distention of small bowel.  SKELETON  No focal hypermetabolic activity to suggest skeletal metastasis.  IMPRESSION: 1.  Aggressive appearing the cavitary hypermetabolic 4.1 x 3.7 cm left lower lobe mass with left hilar and bilateral mediastinal lymphadenopathy, highly concerning for primary bronchogenic neoplasm. This appears to represent T2a, N3, Mx disease (i.e., likely stage IIIB). 2. Previously described new  hepatic lesions are not confidently identified on today's examination. However, PET is insensitive for detection of hepatic lesions, and if clinically relevant, definitive evaluation with MRI of the abdomen with and without IV gadolinium would be recommended to better assess for potential hepatic metastases. 3. 1.8 x 1.0 cm ground-glass attenuation nodule in the posterior aspect of the left upper lobe is similar to the prior study, and warrants continued attention on future followup examinations to exclude the possibility of a low-grade neoplasm such as an adenocarcinoma. 4. Left adrenal adenoma redemonstrated.   Electronically Signed   By: Vinnie Langton M.D.   On: 07/09/2013 17:26   US Abdomen Limited  07/07/2013   CLINICAL DATA:  79 year old with left lung lesions and concern for metastatic disease in the liver. Patient was scheduled for an ultrasound-guided liver biopsy.  EXAM: US ABDOMEN LIMITED - RIGHT UPPER QUADRANT  COMPARISON:  Chest CT 06/25/2013  FINDINGS: Gallbladder  Calcified gallstone. The gallstone measures 1.1 cm. Gallbladder is not distended.  Common bile duct  Diameter: Not measured.  No significant biliary dilatation.  Liver:  The liver was evaluated for lesions. A lesion in the medial left hepatic lobe was not confidently identified. There is a subtle hyperechoic area near the right hepatic dome that may correspond with the area concern from the previous CT. This could represent a small hemangioma.  IMPRESSION: Liver lesions were not confidently identified as metastatic disease. A subtle hyperechoic area near the right hepatic dome could represent a small hemangioma but indeterminate. A liver biopsy was not performed.  Patient is scheduled for a PET-CT to evaluate the left lung lesions. A biopsy site will be determined after the PET-CT. Depending on the results of the PET-CT, the liver may need further evaluation with an MRI to evaluate the areas of concern from the previous CT.    Electronically Signed   By: Markus Daft M.D.   On: 07/07/2013 13:44   Ct Biopsy  07/16/2013   CLINICAL DATA:  79 year old female with a left lower lobe lesion and mediastinal lymphadenopathy. Tissue diagnosis is needed.  EXAM: CT-GUIDED BIOPSY OF LEFT LOWER LOBE LUNG LESION  Physician: Stephan Minister. Henn, MD  MEDICATIONS: Versed 2 mg, fentanyl 50 mcg. A radiology nurse monitored the patient for moderate sedation.  ANESTHESIA/SEDATION: Moderate sedation time: 19 min  PROCEDURE: The procedure was explained to the patient. The risks and benefits of the procedure were discussed and the patient's questions were addressed. Informed consent was obtained from the patient. The patient was placed prone. Images through the lower chest were obtained. The lesion in the posterior left lower lobe was identified. The left side of the back was prepped and draped in sterile fashion. Skin was anesthetized with 1% lidocaine. A 17 gauge needle was directed into the cavitary lesion with CT guidance. Needle was positioned along the lateral aspect of the lesion in order to avoid the cavitary component. Two solid core biopsies were obtained. Specimens were placed in formalin. Needle was removed without complication.  COMPLICATIONS: None  FINDINGS: Cavitary lesion in the posterior left lower lobe measuring up to 3.4 cm. The central aspect of the lesion contains gas. The lateral aspect of the lesion was biopsied.  IMPRESSION: CT-guided core biopsies of the left  lower lobe lesion.   Electronically Signed   By: Markus Daft M.D.   On: 07/16/2013 11:27    ASSESSMENT:  #1. 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 #2. Chronic obstructive pulmonary disease #3. Hemoptysis, not massive. #4. Gastroesophageal reflux disease, on treatment. #5. Hypertension, controlled. #6. Hypothyroidism, on treatment. #7. Anemia of chronic disease and chronic blood loss.Marland Kitchen   PLAN:  #1. A discussion was held about the options for  therapy. I do not believe that the liver abnormalities represent metastatic disease. With that in mind, patient is stage III and would be benefited from using combined modality treatment with radiotherapy plus weekly carboplatin and Taxol. This will also have a higher probability of controlling hemoptysis. #2. Referral to Dr. Orlene Erm. #3. Referral to interventional radiology for life port insertion. #4. Nurse navigator to discuss details of chemotherapy benefits and side effects. Patient was given information today about the 2 drugs. #5. Followup in 3 weeks to begin treatment.    All questions were answered. The patient knows to call the clinic with any problems, questions or concerns. We can certainly see the patient much sooner if necessary.   I spent 25 minutes counseling the patient face to face. The total time spent in the appointment was 30 minutes.    Doroteo Bradford, MD 07/25/2013 1:57 PM

## 2013-07-25 NOTE — Patient Instructions (Addendum)
.  [facility] Discharge Instructions  RECOMMENDATIONS MADE BY THE CONSULTANT AND ANY TEST RESULTS WILL BE SENT TO YOUR REFERRING PHYSICIAN.  EXAM FINDINGS BY THE PHYSICIAN TODAY AND SIGNS OR SYMPTOMS TO REPORT TO CLINIC OR PRIMARY PHYSICIAN: Exam and findings as discussed by Dr. Barnet Glasgow. We will make you an appointment with radiation oncologist in [place] We will set up for you to have a port a cath placed for chemo. This will be done in [place]. Chemotherapy will be weekly carboplatin and taxol with radiation Our nurse navigator will set up chemotherapy teaching for you. Her name is Lupita Raider INSTRUCTIONS/FOLLOW UP Day of chemo   Thank you for choosing [facility] to provide your oncology and hematology care.  To afford each patient quality time with our providers, please arrive at least 15 minutes before your scheduled appointment time.  With your help, our goal is to use those 15 minutes to complete the necessary work-up to ensure our physicians have the information they need to help with your evaluation and healthcare recommendations.    Effective January 1st, 2014, we ask that you re-schedule your appointment with our physicians should you arrive 10 or more minutes late for your appointment.  We strive to give you quality time with our providers, and arriving late affects you and other patients whose appointments are after yours.    Again, thank you for choosing [facility].  Our hope is that these requests will decrease the amount of time that you wait before being seen by our physicians.       _____________________________________________________________  Should you have questions after your visit to [facility] please contact our office at [phone] between the hours of 8:30 a.m. and 5:00 p.m.  Voicemails left after 4:30 p.m. will not be returned until the following business day.  For prescription refill requests, have  your pharmacy contact our office with your prescription refill request.

## 2013-07-28 ENCOUNTER — Other Ambulatory Visit: Payer: Self-pay | Admitting: Radiology

## 2013-07-29 ENCOUNTER — Encounter (HOSPITAL_COMMUNITY): Payer: Self-pay

## 2013-07-29 ENCOUNTER — Ambulatory Visit (HOSPITAL_COMMUNITY)
Admission: RE | Admit: 2013-07-29 | Discharge: 2013-07-29 | Disposition: A | Discharge status: Home / Self Care | Payer: Medicare Other | Source: Ambulatory Visit | Attending: Hematology and Oncology | Admitting: Hematology and Oncology

## 2013-07-29 ENCOUNTER — Other Ambulatory Visit (HOSPITAL_COMMUNITY): Payer: Self-pay | Admitting: Hematology and Oncology

## 2013-07-29 DIAGNOSIS — I739 Peripheral vascular disease, unspecified: Secondary | ICD-10-CM | POA: Insufficient documentation

## 2013-07-29 DIAGNOSIS — C3430 Malignant neoplasm of lower lobe, unspecified bronchus or lung: Secondary | ICD-10-CM | POA: Insufficient documentation

## 2013-07-29 DIAGNOSIS — E785 Hyperlipidemia, unspecified: Secondary | ICD-10-CM | POA: Diagnosis not present

## 2013-07-29 DIAGNOSIS — K219 Gastro-esophageal reflux disease without esophagitis: Secondary | ICD-10-CM | POA: Insufficient documentation

## 2013-07-29 DIAGNOSIS — Z6825 Body mass index (BMI) 25.0-25.9, adult: Secondary | ICD-10-CM | POA: Insufficient documentation

## 2013-07-29 DIAGNOSIS — I10 Essential (primary) hypertension: Secondary | ICD-10-CM | POA: Insufficient documentation

## 2013-07-29 DIAGNOSIS — Z79899 Other long term (current) drug therapy: Secondary | ICD-10-CM | POA: Insufficient documentation

## 2013-07-29 DIAGNOSIS — G4700 Insomnia, unspecified: Secondary | ICD-10-CM | POA: Insufficient documentation

## 2013-07-29 DIAGNOSIS — I2510 Atherosclerotic heart disease of native coronary artery without angina pectoris: Secondary | ICD-10-CM | POA: Insufficient documentation

## 2013-07-29 DIAGNOSIS — C3492 Malignant neoplasm of unspecified part of left bronchus or lung: Secondary | ICD-10-CM

## 2013-07-29 DIAGNOSIS — C3490 Malignant neoplasm of unspecified part of unspecified bronchus or lung: Secondary | ICD-10-CM | POA: Diagnosis not present

## 2013-07-29 DIAGNOSIS — Z7982 Long term (current) use of aspirin: Secondary | ICD-10-CM | POA: Insufficient documentation

## 2013-07-29 DIAGNOSIS — E039 Hypothyroidism, unspecified: Secondary | ICD-10-CM | POA: Diagnosis not present

## 2013-07-29 DIAGNOSIS — F17200 Nicotine dependence, unspecified, uncomplicated: Secondary | ICD-10-CM | POA: Diagnosis not present

## 2013-07-29 HISTORY — DX: Malignant neoplasm of unspecified part of left bronchus or lung: C34.92

## 2013-07-29 HISTORY — DX: Malignant (primary) neoplasm, unspecified: C80.1

## 2013-07-29 LAB — CBC
HCT: 33.2 % — ABNORMAL LOW (ref 36.0–46.0)
Hemoglobin: 10.9 g/dL — ABNORMAL LOW (ref 12.0–15.0)
MCH: 32.2 pg (ref 26.0–34.0)
MCHC: 32.8 g/dL (ref 30.0–36.0)
MCV: 97.9 fL (ref 78.0–100.0)
PLATELETS: 291 10*3/uL (ref 150–400)
RBC: 3.39 MIL/uL — ABNORMAL LOW (ref 3.87–5.11)
RDW: 14 % (ref 11.5–15.5)
WBC: 5.8 10*3/uL (ref 4.0–10.5)

## 2013-07-29 LAB — PROTIME-INR
INR: 1.02 (ref 0.00–1.49)
Prothrombin Time: 13.2 seconds (ref 11.6–15.2)

## 2013-07-29 LAB — APTT: aPTT: 41 seconds — ABNORMAL HIGH (ref 24–37)

## 2013-07-29 MED ORDER — HEPARIN SOD (PORK) LOCK FLUSH 100 UNIT/ML IV SOLN
INTRAVENOUS | Status: AC
  Filled 2013-07-29: qty 5

## 2013-07-29 MED ORDER — MIDAZOLAM HCL 2 MG/2ML IJ SOLN
INTRAMUSCULAR | Status: AC
  Filled 2013-07-29: qty 4

## 2013-07-29 MED ORDER — LIDOCAINE-EPINEPHRINE (PF) 2 %-1:200000 IJ SOLN
INTRAMUSCULAR | Status: AC
  Filled 2013-07-29: qty 20

## 2013-07-29 MED ORDER — FENTANYL CITRATE 0.05 MG/ML IJ SOLN
INTRAMUSCULAR | Status: AC | PRN
  Administered 2013-07-29: 100 ug via INTRAVENOUS

## 2013-07-29 MED ORDER — HEPARIN SOD (PORK) LOCK FLUSH 100 UNIT/ML IV SOLN
500.0000 [IU] | Freq: Once | INTRAVENOUS | Status: AC
  Administered 2013-07-29: 500 [IU] via INTRAVENOUS

## 2013-07-29 MED ORDER — LIDOCAINE HCL 1 % IJ SOLN
INTRAMUSCULAR | Status: AC
  Filled 2013-07-29: qty 20

## 2013-07-29 MED ORDER — LIDOCAINE-PRILOCAINE 2.5-2.5 % EX CREA: TOPICAL_CREAM | CUTANEOUS | Status: DC

## 2013-07-29 MED ORDER — METOCLOPRAMIDE HCL 5 MG PO TABS: ORAL_TABLET | ORAL | Status: DC

## 2013-07-29 MED ORDER — SODIUM CHLORIDE 0.9 % IV SOLN
INTRAVENOUS | Status: DC
  Administered 2013-07-29: 12:00:00 via INTRAVENOUS

## 2013-07-29 MED ORDER — MIDAZOLAM HCL 2 MG/2ML IJ SOLN
INTRAMUSCULAR | Status: AC | PRN
  Administered 2013-07-29 (×3): 1 mg via INTRAVENOUS

## 2013-07-29 MED ORDER — CEFAZOLIN SODIUM-DEXTROSE 2-3 GM-% IV SOLR
2.0000 g | INTRAVENOUS | Status: AC
Start: 2013-07-29 — End: 2013-07-29
  Administered 2013-07-29: 2 g via INTRAVENOUS
  Filled 2013-07-29: qty 50

## 2013-07-29 MED ORDER — FENTANYL CITRATE 0.05 MG/ML IJ SOLN
INTRAMUSCULAR | Status: AC
  Filled 2013-07-29: qty 4

## 2013-07-29 MED ORDER — PROCHLORPERAZINE MALEATE 10 MG PO TABS
ORAL_TABLET | ORAL | Status: DC
Start: 2013-07-29 — End: 2014-02-13

## 2013-07-29 NOTE — H&P (Signed)
Chief Complaint: "I'm here for a portacath" Referring Physician: Barnet Glasgow HPI: [name] is an 80 y.o. female with newly found left lung mass and has had CT guided biopsy proving squamous cell lung cancer. She tolerated that procedure well and is scheduled today for portacath as she is to start chemotherapy and radiation. PMHx and meds reviewed. Pt feels ok, no recent fevers, illness, CP, SOB  Past Medical History:  Past Medical History  Diagnosis Date  . Hypertension   . Hypothyroidism   . Hyperlipidemia   . Insomnia   . GERD (gastroesophageal reflux disease)     chronic gastritis  . Adrenal adenoma   . Chronic diarrhea   . Fatty liver   . Arthritis     knee  . QT prolongation     syncope  . Coronary artery disease   . Impaired fasting glucose   . Renal insufficiency     low protien diet  . Tobacco use     1/2 ppd, approx 25-50 pack years (as of 08/2012)  . Family history of heart disease   . IBS (irritable bowel syndrome)   . Peripheral arterial disease     sstatus post  infrarenal abdominal aortic tube graft placed by Dr. Victorino Dike February 2008  . Cancer     Lung    Past Surgical History:  Past Surgical History  Procedure Laterality Date  . Abdominal hysterectomy    . Thyroidectomy, partial    . Colonoscopy  5/04    normal  . Abdominal aortic aneurysm repair  07/2006    D. J.D. Kellie Simmering  . Cataract extraction Bilateral 2010  . Cardiac catheterization  02/2010    mod CAD in L-dominant system with normal LV function  . Colon resection  2005    1/2 colon removed  . Transthoracic echocardiogram  02/2010    EF 55-60%; mild conc LVH, normal systolic function; mildly calcified AV annulus  . Colonoscopy with esophagogastroduodenoscopy (egd) N/A 03/19/2013    Procedure: COLONOSCOPY WITH ESOPHAGOGASTRODUODENOSCOPY (EGD);  Surgeon: Rogene Houston, MD;  Location: [facility];  Service: Endoscopy;  Laterality: N/A;  145  . Dg biopsy lung Left Jan 2015    Family  History:  Family History  Problem Relation Age of Onset  . Hypertension Mother   . Diabetes Mother   . Heart attack Mother   . Hyperlipidemia Sister     x3 sister  . Kidney disease Brother     Social History:  reports that she has been smoking Cigarettes.  She has a 29.5 pack-year smoking history. She has never used smokeless tobacco. She reports that she does not drink alcohol or use illicit drugs.  Allergies:  Allergies  Allergen Reactions  . Aleve [Naproxen Sodium] Other (See Comments)    GI bleed  . Dilaudid [Hydromorphone Hcl]     Can not tolerate  . Dyazide [Hydrochlorothiazide W-Triamterene] Other (See Comments)    cramps  . Zithromax [Azithromycin] Itching    Medications:   Medication List    ASK your doctor about these medications       allopurinol 300 MG tablet  Commonly known as:  ZYLOPRIM  Take 300 mg by mouth daily.     ALPRAZolam 1 MG tablet  Commonly known as:  XANAX  TAKE ONE HALF TABLET BY MOUTH 4 TIMES DAILY AS NEEDED     amLODipine 10 MG tablet  Commonly known as:  NORVASC  Take 10 mg by mouth at bedtime.  aspirin EC 81 MG tablet  Take 81 mg by mouth daily.     Cholecalciferol 1000 UNITS capsule  Take 1,000 Units by mouth daily.     enalapril 20 MG tablet  Commonly known as:  VASOTEC  Take 1 tablet (20 mg total) by mouth daily.     esomeprazole 40 MG capsule  Commonly known as:  NEXIUM  Take 40 mg by mouth daily at 12 noon.     fenofibrate 160 MG tablet  TAKE ONE TABLET BY MOUTH ONCE DAILY WITH FOOD     ferrous sulfate 325 (65 FE) MG tablet  Commonly known as:  FERROUSUL  Take 1 tablet (325 mg total) by mouth 2 (two) times daily after a meal.     furosemide 20 MG tablet  Commonly known as:  LASIX  Take 1 tablet (20 mg total) by mouth daily.     guaifenesin 100 MG/5ML syrup  Commonly known as:  ROBITUSSIN  Take 200 mg by mouth 3 (three) times daily as needed for cough.     hydrALAZINE 25 MG tablet  Commonly known as:   APRESOLINE  Take 25 mg by mouth 3 (three) times daily.     hydrOXYzine 25 MG tablet  Commonly known as:  ATARAX/VISTARIL  Take 1 tablet (25 mg total) by mouth 3 (three) times daily as needed for itching.     levothyroxine 137 MCG tablet  Commonly known as:  SYNTHROID, LEVOTHROID  Take 137 mcg by mouth daily before breakfast.     loperamide 2 MG capsule  Commonly known as:  IMODIUM  Take 2 mg by mouth 4 (four) times daily as needed for diarrhea or loose stools.     magnesium oxide 400 MG tablet  Commonly known as:  MAG-OX  Take 750 mg by mouth daily.     ondansetron 8 MG tablet  Commonly known as:  ZOFRAN  Take 1 tablet (8 mg total) by mouth every 8 (eight) hours as needed for nausea.     PARoxetine 30 MG tablet  Commonly known as:  PAXIL  Take 30 mg by mouth daily.     pravastatin 20 MG tablet  Commonly known as:  PRAVACHOL  Take 20 mg by mouth at bedtime.     traMADol 50 MG tablet  Commonly known as:  ULTRAM  Take 1 tablet (50 mg total) by mouth every 6 (six) hours as needed.     zolpidem 10 MG tablet  Commonly known as:  AMBIEN  Take 10 mg by mouth at bedtime as needed for sleep.        Please HPI for pertinent positives, otherwise complete 10 system ROS negative.  Physical Exam: BP 128/69  Pulse 63  Temp(Src) 99.1 F (37.3 C) (Oral)  Resp 18  Ht 5\' 7"  (1.702 m)  Wt 164 lb (74.39 kg)  BMI 25.68 kg/m2  SpO2 99% Body mass index is 25.68 kg/(m^2).   General Appearance:  Alert, cooperative, no distress, appears stated age  Head:  Normocephalic, without obvious abnormality, atraumatic  ENT: Unremarkable  Neck: Supple, symmetrical, trachea midline  Lungs:   Clear to auscultation bilaterally, no w/r/r, respirations unlabored without use of accessory muscles.  Chest Wall:  No tenderness or deformity  Heart:  Regular rate and rhythm, S1, S2 normal, no murmur, rub or gallop.  Abdomen:   Soft, non-tender, non distended.  Neurologic: Normal affect, no gross  deficits.   Results for orders placed during the hospital encounter of 07/29/13 (from the  past 48 hour(s))  APTT     Status: Abnormal   Collection Time    07/29/13 11:56 AM      Result Value Range   aPTT 41 (*) 24 - 37 seconds   Comment:            IF BASELINE aPTT IS ELEVATED,     SUGGEST PATIENT RISK ASSESSMENT     BE USED TO DETERMINE APPROPRIATE     ANTICOAGULANT THERAPY.  CBC     Status: Abnormal   Collection Time    07/29/13 11:56 AM      Result Value Range   WBC 5.8  4.0 - 10.5 K/uL   RBC 3.39 (*) 3.87 - 5.11 MIL/uL   Hemoglobin 10.9 (*) 12.0 - 15.0 g/dL   HCT 33.2 (*) 36.0 - 46.0 %   MCV 97.9  78.0 - 100.0 fL   MCH 32.2  26.0 - 34.0 pg   MCHC 32.8  30.0 - 36.0 g/dL   RDW 14.0  11.5 - 15.5 %   Platelets 291  150 - 400 K/uL  PROTIME-INR     Status: None   Collection Time    07/29/13 11:56 AM      Result Value Range   Prothrombin Time 13.2  11.6 - 15.2 seconds   INR 1.02  0.00 - 1.49    Assessment/Plan Lung cancer. For Portacath placement. Explained procedure, risks, complications, use of sedation. Labs reviewed, ok Consent signed in chart.  Ascencion Dike PA-C 07/29/2013, 12:39 PM

## 2013-07-29 NOTE — Procedures (Signed)
Placement of right jugular portacath.  Tip in lower SVC and ready to use.

## 2013-07-29 NOTE — Discharge Instructions (Signed)
Implanted Port Home Guide °An implanted port is a type of central line that is placed under the skin. Central lines are used to provide IV access when treatment or nutrition needs to be given through a person's veins. Implanted ports are used for long-term IV access. An implanted port may be placed because:  °· You need IV medicine that would be irritating to the small veins in your hands or arms.   °· You need long-term IV medicines, such as antibiotics.   °· You need IV nutrition for a long period.   °· You need frequent blood draws for lab tests.   °· You need dialysis.   °Implanted ports are usually placed in the chest area, but they can also be placed in the upper arm, the abdomen, or the leg. An implanted port has two main parts:  °· Reservoir. The reservoir is round and will appear as a small, raised area under your skin. The reservoir is the part where a needle is inserted to give medicines or draw blood.   °· Catheter. The catheter is a thin, flexible tube that extends from the reservoir. The catheter is placed into a large vein. Medicine that is inserted into the reservoir goes into the catheter and then into the vein.   °HOW WILL I CARE FOR MY INCISION SITE? °Do not get the incision site wet. Bathe or shower as directed by your health care provider.  °HOW IS MY PORT ACCESSED? °Special steps must be taken to access the port:  °· Before the port is accessed, a numbing cream can be placed on the skin. This helps numb the skin over the port site.   °· Your health care provider uses a sterile technique to access the port. °· Your health care provider must put on a mask and sterile gloves. °· The skin over your port is cleaned carefully with an antiseptic and allowed to dry. °· The port is gently pinched between sterile gloves, and a needle is inserted into the port. °· Only "non-coring" port needles should be used to access the port. Once the port is accessed, a blood return should be checked. This helps  ensure that the port is in the vein and is not clogged.   °· If your port needs to remain accessed for a constant infusion, a clear (transparent) bandage will be placed over the needle site. The bandage and needle will need to be changed every week, or as directed by your health care provider.   °· Keep the bandage covering the needle clean and dry. Do not get it wet. Follow your health care provider's instructions on how to take a shower or bath while the port is accessed.   °· If your port does not need to stay accessed, no bandage is needed over the port.   °WHAT IS FLUSHING? °Flushing helps keep the port from getting clogged. Follow your health care provider's instructions on how and when to flush the port. Ports are usually flushed with saline solution or a medicine called heparin. The need for flushing will depend on how the port is used.  °· If the port is used for intermittent medicines or blood draws, the port will need to be flushed:   °· After medicines have been given.   °· After blood has been drawn.   °· As part of routine maintenance.   °· If a constant infusion is running, the port may not need to be flushed.   °HOW LONG WILL MY PORT STAY IMPLANTED? °The port can stay in for as long as your health care   provider thinks it is needed. When it is time for the port to come out, surgery will be done to remove it. The procedure is similar to the one performed when the port was put in.  °WHEN SHOULD I SEEK IMMEDIATE MEDICAL CARE? °When you have an implanted port, you should seek immediate medical care if:  °· You notice a bad smell coming from the incision site.   °· You have swelling, redness, or drainage at the incision site.   °· You have more swelling or pain at the port site or the surrounding area.   °· You have a fever that is not controlled with medicine. °Document Released: 06/12/2005 Document Revised: 04/02/2013 Document Reviewed: 02/17/2013 °[employer]® Patient Information ©2014 [employer]. °Moderate Sedation, Adult °Moderate sedation is given to help you relax or even sleep through a procedure. You may remain sleepy, be clumsy, or have poor balance for several hours following this procedure. Arrange for a responsible adult, family member, or friend to take you home. A responsible adult should stay with you for at least 24 hours or until the medicines have worn off. °· Do not participate in any activities where you could become injured for the next 24 hours, or until you feel normal again. Do not: °· Drive. °· Swim. °· Ride a bicycle. °· Operate heavy machinery. °· Cook. °· Use power tools. °· Climb ladders. °· Work at heights. °· Do not make important decisions or sign legal documents until you are improved. °· Vomiting may occur if you eat too soon. When you can drink without vomiting, try water, juice, or soup. Try solid foods if you feel little or no nausea. °· Only take over-the-counter or prescription medications for pain, discomfort, or fever as directed by your caregiver.If pain medications have been prescribed for you, ask your caregiver how soon it is safe to take them. °· Make sure you and your family fully understands everything about the medication given to you. Make sure you understand what side effects may occur. °· You should not drink alcohol, take sleeping pills, or medications that cause drowsiness for at least 24 hours. °· If you smoke, do not smoke alone. °· If you are feeling better, you may resume normal activities 24 hours after receiving sedation. °· Keep all appointments as scheduled. Follow all instructions. °· Ask questions if you do not understand. °SEEK MEDICAL CARE IF:  °· Your skin is pale or bluish in color. °· You continue to feel sick to your stomach (nauseous) or throw up (vomit). °· Your pain is getting worse and not helped by medication. °· You have bleeding or swelling. °· You are still sleepy or feeling clumsy after 24 hours. °SEEK IMMEDIATE MEDICAL CARE IF:   °· You develop a rash. °· You have difficulty breathing. °· You develop any type of allergic problem. °· You have a fever. °Document Released: 03/07/2001 Document Revised: 09/04/2011 Document Reviewed: 02/17/2013 °[employer]® Patient Information ©2014 [employer]. ° °

## 2013-07-29 NOTE — Patient Instructions (Addendum)
[place]   CHEMOTHERAPY INSTRUCTIONS  Taxol - the first time you receive this drug we will titrate it very slowly to ensure that you do not have or are not having an allergic reaction to the chemo. Side Effects: hair loss, lowers your white blood cells (fight infection), muscle aches, nausea/vomiting, irritation to the mouth (mouth sores, pain in your mouth) *neuropathy - numbness/tingling/burning/pain in hands/fingers/feet/toes. We need to know as soon as this begins to happen so that we can monitor it and treat if necessary. The numbness generally begins in the fingertips of tips of toes and then begins to travel up the finger/toe/hand/foot. We never want you getting to where you can't pick up a pen, coin, zip a zipper, button a button, or have trouble walking. You must tell us immediately if you are experiencing peripheral neuropathy!  Carboplatin - this medication can be hard on your kidneys - this is why we need you to drink 64 oz of fluid (preferably water/decaff fluids) 2 days prior to chemo and for up to 4-5 days after chemo. Drink more if you can. This will help to keep your kidneys flushed. This can cause mild hair loss, lower your platelets (which make your blood clot), lower your white blood cells (fight infection), and cause nausea/vomiting.   Your pre-meds (given before chemo) will consist of Zofran (for nausea) and Dexamethasone IV (steroid - given to decrease the risk of you having an allergic reaction to your chemo, this med also decreases nausea/vomiting, and should generally make you feel better). Sometimes people have side effects related to the Dexamethasone. It can make some people have more energy, have trouble sleeping, make you anxious or jittery. It can sometimes cause people to look reddened or feel flushed in the face, neck, and chest areas. This will go away. You will also receive Benadryl and Pepcid IV - these are both being given to reduce  your risk of having an allergic reaction to the Taxol.  POTENTIAL SIDE EFFECTS OF TREATMENT: Increased Susceptibility to Infection/Bone Marrow Suppression, Nausea/Vomiting, Abdominal Cramping, Indigestion, Hiccups, Constipation/Diarrhea, Hair Thinning/Hair Loss, Changes in Character of Skin and Nails (brittleness, dryness,etc.)  SELF IMAGE NEEDS AND REFERRALS MADE: Obtain hair accessories as soon as possible (wigs, scarves, turbans,caps,etc.)  Referral to Look Good, Feel Better consultant   EDUCATIONAL MATERIALS GIVEN AND REVIEWED: How Chemotherapy Affects Your Blood Counts  Specific Instructions Sheets: Carboplatin/Taxol, Dexamethasone, Zofran, Benadryl, Pepcid, Metoclopramide, Prochlorperazine, EMLA cream   SELF CARE ACTIVITIES WHILE ON CHEMOTHERAPY: Increase your fluid intake 48 hours prior to treatment and drink at least 2 quarts per day after treatment., No alcohol intake., No aspirin or other medications unless approved by your oncologist., Eat foods that are light and easy to digest., Eat foods at cold or room temperature., No fried, fatty, or spicy foods immediately before or after treatment., Have teeth cleaned professionally before starting treatment. Keep dentures and partial plates clean., Use soft toothbrush and do not use mouthwashes that contain alcohol. Biotene is a good mouthwash. Use warm salt water gargles (1 teaspoon salt per 1 quart warm water) before and after meals and at bedtime. Or you may rinse with 2 tablespoons of three -percent hydrogen peroxide mixed in eight ounces of water., Always use sunscreen with SPF (Sun Protection Factor) of 30 or higher., Use your nausea medication as directed to prevent nausea., Use your stool softener or laxative as directed to prevent constipation. and Use your anti-diarrheal medication as directed to stop diarrhea.  Please wash your hands for at least 30 seconds using warm soapy water. Handwashing is the #1 way to prevent the spread of  germs. Stay away from sick people or people who are getting over a cold. If you develop respiratory systems such as green/yellow mucus production or productive cough or persistent cough let us know and we will see if you need an antibiotic. It is a good idea to keep a pair of gloves on when going into grocery stores/[employer] to decrease your risk of coming into contact with germs on the carts, etc. Carry alcohol hand gel with you at all times and use it frequently if out in public. All foods need to be cooked thoroughly. No raw foods. No medium or undercooked meats, eggs. If your food is cooked medium well, it does not need to be hot pink or saturated with bloody liquid at all. Vegetables and fruits need to be washed/rinsed under the faucet with a dish detergent before being consumed. You can eat raw fruits and vegetables unless we tell you otherwise but it would be best if you cooked them or bought frozen. Do not eat off of salad bars or hot bars unless you really trust the cleanliness of the restaurant. If you need dental work, please let us know before you go for your appointment so that we can coordinate the best possible time for you in regards to your chemo regimen. You need to also let your dentist know that you are actively taking chemo. We may need to do labs prior to your dental appointment. We also want your bowels moving at least every other day. If this is not happening, we need to know so that we can get you on a bowel regimen to help you go.    MEDICATIONS: You have been given prescriptions for the following medications:  Metoclopramide (Reglan) 1m tablet. Starting the day after chemo, take 1 tablet four times a day x 48 hours. Then may take 1 tablet four times a day if needed for nausea/vomiting.   Prochlorperazine (Compazine) 131mtablet. Starting the day after chemo, take 1 tablet four times a day x 48 hours. Then may take 1 tablet four times a day if needed for nausea/vomiting.   EMLA  cream. Apply a quarter size amount to port site 1 hour prior to chemo. Do not rub in. Cover with plastic wrap.    Over-the-Counter Meds:  Colace - this is a stool softener. Take 10044mapsule 2-6 times a day as needed. If you have to take more than 6 capsules of Colace a day call the [facility] - this is a mild laxative used to treat mild constipation. May take 2 tabs by mouth daily or up to twice a day as needed for mild constipation.  Milk of Magnesia - this is a laxative used to treat moderate to severe constipation. May take 2-4 tablespoons every 8 hours as needed. May increase to 8 tablespoons x 1 dose and if no bowel movement call the [facility] - this is for diarrhea. Take 2 tabs after 1st loose stool and then 1 tab after each loose stool until you go a total of 12 hours without a loose stool. Call [facility] loose stools continue.   SYMPTOMS TO REPORT AS SOON AS POSSIBLE AFTER TREATMENT:  FEVER GREATER THAN 100.5 F  CHILLS WITH OR WITHOUT FEVER  NAUSEA AND VOMITING THAT IS NOT CONTROLLED WITH YOUR NAUSEA MEDICATION  UNUSUAL SHORTNESS  OF BREATH  UNUSUAL BRUISING OR BLEEDING  TENDERNESS IN MOUTH AND THROAT WITH OR WITHOUT PRESENCE OF ULCERS  URINARY PROBLEMS  BOWEL PROBLEMS  UNUSUAL RASH    Wear comfortable clothing and clothing appropriate for easy access to any Portacath or PICC line. Let us know if there is anything that we can do to make your therapy better!      I have been informed and understand all of the instructions given to me and have received a copy. I have been instructed to call the clinic [phone] or my family physician as soon as possible for continued medical care, if indicated. I do not have any more questions at this time but understand that I may call the [facility] or the Patient Navigator at [phone] during office hours should I have questions or need assistance in obtaining follow-up  care.      _________________________________________      _______________     __________ Signature of Patient or Authorized Representative        Date                            Time      _________________________________________ Nurse's Signature      Paclitaxel injection What is this medicine? PACLITAXEL (PAK li TAX el) is a chemotherapy drug. It targets fast dividing cells, like cancer cells, and causes these cells to die. This medicine is used to treat ovarian cancer, breast cancer, and other cancers. This medicine may be used for other purposes; ask your health care provider or pharmacist if you have questions. COMMON BRAND NAME(S): Onxol , Taxol What should I tell my health care provider before I take this medicine? They need to know if you have any of these conditions: -blood disorders -irregular heartbeat -infection (especially a virus infection such as chickenpox, cold sores, or herpes) -liver disease -previous or ongoing radiation therapy -an unusual or allergic reaction to paclitaxel, alcohol, polyoxyethylated castor oil, other chemotherapy agents, other medicines, foods, dyes, or preservatives -pregnant or trying to get pregnant -breast-feeding How should I use this medicine? This drug is given as an infusion into a vein. It is administered in a hospital or clinic by a specially trained health care professional. Talk to your pediatrician regarding the use of this medicine in children. Special care may be needed. Overdosage: If you think you have taken too much of this medicine contact a poison control center or emergency room at once. NOTE: This medicine is only for you. Do not share this medicine with others. What if I miss a dose? It is important not to miss your dose. Call your doctor or health care professional if you are unable to keep an appointment. What may interact with this medicine? Do not take this medicine with any of the following  medications: -disulfiram -metronidazole This medicine may also interact with the following medications: -cyclosporine -diazepam -ketoconazole -medicines to increase blood counts like filgrastim, pegfilgrastim, sargramostim -other chemotherapy drugs like cisplatin, doxorubicin, epirubicin, etoposide, teniposide, vincristine -quinidine -testosterone -vaccines -verapamil Talk to your doctor or health care professional before taking any of these medicines: -acetaminophen -aspirin -ibuprofen -ketoprofen -naproxen This list may not describe all possible interactions. Give your health care provider a list of all the medicines, herbs, non-prescription drugs, or dietary supplements you use. Also tell them if you smoke, drink alcohol, or use illegal drugs. Some items may interact with your medicine. What should I watch  for while using this medicine? Your condition will be monitored carefully while you are receiving this medicine. You will need important blood work done while you are taking this medicine. This drug may make you feel generally unwell. This is not uncommon, as chemotherapy can affect healthy cells as well as cancer cells. Report any side effects. Continue your course of treatment even though you feel ill unless your doctor tells you to stop. In some cases, you may be given additional medicines to help with side effects. Follow all directions for their use. Call your doctor or health care professional for advice if you get a fever, chills or sore throat, or other symptoms of a cold or flu. Do not treat yourself. This drug decreases your body's ability to fight infections. Try to avoid being around people who are sick. This medicine may increase your risk to bruise or bleed. Call your doctor or health care professional if you notice any unusual bleeding. Be careful brushing and flossing your teeth or using a toothpick because you may get an infection or bleed more easily. If you have any  dental work done, tell your dentist you are receiving this medicine. Avoid taking products that contain aspirin, acetaminophen, ibuprofen, naproxen, or ketoprofen unless instructed by your doctor. These medicines may hide a fever. Do not become pregnant while taking this medicine. Women should inform their doctor if they wish to become pregnant or think they might be pregnant. There is a potential for serious side effects to an unborn child. Talk to your health care professional or pharmacist for more information. Do not breast-feed an infant while taking this medicine. Men are advised not to father a child while receiving this medicine. What side effects may I notice from receiving this medicine? Side effects that you should report to your doctor or health care professional as soon as possible: -allergic reactions like skin rash, itching or hives, swelling of the face, lips, or tongue -low blood counts - This drug may decrease the number of white blood cells, red blood cells and platelets. You may be at increased risk for infections and bleeding. -signs of infection - fever or chills, cough, sore throat, pain or difficulty passing urine -signs of decreased platelets or bleeding - bruising, pinpoint red spots on the skin, black, tarry stools, nosebleeds -signs of decreased red blood cells - unusually weak or tired, fainting spells, lightheadedness -breathing problems -chest pain -high or low blood pressure -mouth sores -nausea and vomiting -pain, swelling, redness or irritation at the injection site -pain, tingling, numbness in the hands or feet -slow or irregular heartbeat -swelling of the ankle, feet, hands Side effects that usually do not require medical attention (report to your doctor or health care professional if they continue or are bothersome): -bone pain -complete hair loss including hair on your head, underarms, pubic hair, eyebrows, and eyelashes -changes in the color of  fingernails -diarrhea -loosening of the fingernails -loss of appetite -muscle or joint pain -red flush to skin -sweating This list may not describe all possible side effects. Call your doctor for medical advice about side effects. You may report side effects to FDA at 1-800-FDA-1088. Where should I keep my medicine? This drug is given in a hospital or clinic and will not be stored at home. NOTE: This sheet is a summary. It may not cover all possible information. If you have questions about this medicine, talk to your doctor, pharmacist, or health care provider.  2014, [employer]/Gold Standard. (2012-08-05 16:41:21) Carboplatin  injection What is this medicine? CARBOPLATIN (KAR boe pla tin) is a chemotherapy drug. It targets fast dividing cells, like cancer cells, and causes these cells to die. This medicine is used to treat ovarian cancer and many other cancers. This medicine may be used for other purposes; ask your health care provider or pharmacist if you have questions. COMMON BRAND NAME(S): Paraplatin What should I tell my health care provider before I take this medicine? They need to know if you have any of these conditions: -blood disorders -hearing problems -kidney disease -recent or ongoing radiation therapy -an unusual or allergic reaction to carboplatin, cisplatin, other chemotherapy, other medicines, foods, dyes, or preservatives -pregnant or trying to get pregnant -breast-feeding How should I use this medicine? This drug is usually given as an infusion into a vein. It is administered in a hospital or clinic by a specially trained health care professional. Talk to your pediatrician regarding the use of this medicine in children. Special care may be needed. Overdosage: If you think you have taken too much of this medicine contact a poison control center or emergency room at once. NOTE: This medicine is only for you. Do not share this medicine with others. What if I miss a  dose? It is important not to miss a dose. Call your doctor or health care professional if you are unable to keep an appointment. What may interact with this medicine? -medicines for seizures -medicines to increase blood counts like filgrastim, pegfilgrastim, sargramostim -some antibiotics like amikacin, gentamicin, neomycin, streptomycin, tobramycin -vaccines Talk to your doctor or health care professional before taking any of these medicines: -acetaminophen -aspirin -ibuprofen -ketoprofen -naproxen This list may not describe all possible interactions. Give your health care provider a list of all the medicines, herbs, non-prescription drugs, or dietary supplements you use. Also tell them if you smoke, drink alcohol, or use illegal drugs. Some items may interact with your medicine. What should I watch for while using this medicine? Your condition will be monitored carefully while you are receiving this medicine. You will need important blood work done while you are taking this medicine. This drug may make you feel generally unwell. This is not uncommon, as chemotherapy can affect healthy cells as well as cancer cells. Report any side effects. Continue your course of treatment even though you feel ill unless your doctor tells you to stop. In some cases, you may be given additional medicines to help with side effects. Follow all directions for their use. Call your doctor or health care professional for advice if you get a fever, chills or sore throat, or other symptoms of a cold or flu. Do not treat yourself. This drug decreases your body's ability to fight infections. Try to avoid being around people who are sick. This medicine may increase your risk to bruise or bleed. Call your doctor or health care professional if you notice any unusual bleeding. Be careful brushing and flossing your teeth or using a toothpick because you may get an infection or bleed more easily. If you have any dental work  done, tell your dentist you are receiving this medicine. Avoid taking products that contain aspirin, acetaminophen, ibuprofen, naproxen, or ketoprofen unless instructed by your doctor. These medicines may hide a fever. Do not become pregnant while taking this medicine. Women should inform their doctor if they wish to become pregnant or think they might be pregnant. There is a potential for serious side effects to an unborn child. Talk to your health care professional or  pharmacist for more information. Do not breast-feed an infant while taking this medicine. What side effects may I notice from receiving this medicine? Side effects that you should report to your doctor or health care professional as soon as possible: -allergic reactions like skin rash, itching or hives, swelling of the face, lips, or tongue -signs of infection - fever or chills, cough, sore throat, pain or difficulty passing urine -signs of decreased platelets or bleeding - bruising, pinpoint red spots on the skin, black, tarry stools, nosebleeds -signs of decreased red blood cells - unusually weak or tired, fainting spells, lightheadedness -breathing problems -changes in hearing -changes in vision -chest pain -high blood pressure -low blood counts - This drug may decrease the number of white blood cells, red blood cells and platelets. You may be at increased risk for infections and bleeding. -nausea and vomiting -pain, swelling, redness or irritation at the injection site -pain, tingling, numbness in the hands or feet -problems with balance, talking, walking -trouble passing urine or change in the amount of urine Side effects that usually do not require medical attention (report to your doctor or health care professional if they continue or are bothersome): -hair loss -loss of appetite -metallic taste in the mouth or changes in taste This list may not describe all possible side effects. Call your doctor for medical advice  about side effects. You may report side effects to FDA at 1-800-FDA-1088. Where should I keep my medicine? This drug is given in a hospital or clinic and will not be stored at home. NOTE: This sheet is a summary. It may not cover all possible information. If you have questions about this medicine, talk to your doctor, pharmacist, or health care provider.  2014, [employer]/Gold Standard. (2007-09-17 14:38:05) Dexamethasone injection What is this medicine? DEXAMETHASONE (dex a METH a sone) is a corticosteroid. It is used to treat inflammation of the skin, joints, lungs, and other organs. Common conditions treated include asthma, allergies, and arthritis. It is also used for other conditions, like blood disorders and diseases of the adrenal glands. This medicine may be used for other purposes; ask your health care provider or pharmacist if you have questions. COMMON BRAND NAME(S): Decadron, Solurex What should I tell my health care provider before I take this medicine? They need to know if you have any of these conditions: -blood clotting problems -Cushing's syndrome -diabetes -glaucoma -heart problems or disease -high blood pressure -infection like herpes, measles, tuberculosis, or chickenpox -kidney disease -liver disease -mental problems -myasthenia gravis -osteoporosis -previous heart attack -seizures -stomach, ulcer or intestine disease including colitis and diverticulitis -thyroid problem -an unusual or allergic reaction to dexamethasone, corticosteroids, other medicines, lactose, foods, dyes, or preservatives -pregnant or trying to get pregnant -breast-feeding How should I use this medicine? This medicine is for injection into a muscle, joint, lesion, soft tissue, or vein. It is given by a health care professional in a hospital or clinic setting. Talk to your pediatrician regarding the use of this medicine in children. Special care may be needed. Overdosage: If you think you have  taken too much of this medicine contact a poison control center or emergency room at once. NOTE: This medicine is only for you. Do not share this medicine with others. What if I miss a dose? This may not apply. If you are having a series of injections over a prolonged period, try not to miss an appointment. Call your doctor or health care professional to reschedule if you are unable to keep  an appointment. What may interact with this medicine? Do not take this medicine with any of the following medications: -mifepristone, RU-486 -vaccines This medicine may also interact with the following medications: -amphotericin B -antibiotics like clarithromycin, erythromycin, and troleandomycin -aspirin and aspirin-like drugs -barbiturates like phenobarbital -carbamazepine -cholestyramine -cholinesterase inhibitors like donepezil, galantamine, rivastigmine, and tacrine -cyclosporine -digoxin -diuretics -ephedrine -female hormones, like estrogens or progestins and birth control pills -indinavir -isoniazid -ketoconazole -medicines for diabetes -medicines that improve muscle tone or strength for conditions like myasthenia gravis -NSAIDs, medicines for pain and inflammation, like ibuprofen or naproxen -phenytoin -rifampin -thalidomide -warfarin This list may not describe all possible interactions. Give your health care provider a list of all the medicines, herbs, non-prescription drugs, or dietary supplements you use. Also tell them if you smoke, drink alcohol, or use illegal drugs. Some items may interact with your medicine. What should I watch for while using this medicine? Your condition will be monitored carefully while you are receiving this medicine. If you are taking this medicine for a long time, carry an identification card with your name and address, the type and dose of your medicine, and your doctor's name and address. This medicine may increase your risk of getting an infection. Stay  away from people who are sick. Tell your doctor or health care professional if you are around anyone with measles or chickenpox. Talk to your health care provider before you get any vaccines that you take this medicine. If you are going to have surgery, tell your doctor or health care professional that you have taken this medicine within the last twelve months. Ask your doctor or health care professional about your diet. You may need to lower the amount of salt you eat. The medicine can increase your blood sugar. If you are a diabetic check with your doctor if you need help adjusting the dose of your diabetic medicine. What side effects may I notice from receiving this medicine? Side effects that you should report to your doctor or health care professional as soon as possible: -allergic reactions like skin rash, itching or hives, swelling of the face, lips, or tongue -black or tarry stools -change in the amount of urine -changes in vision -confusion, excitement, restlessness, a false sense of well-being -fever, sore throat, sneezing, cough, or other signs of infection, wounds that will not heal -hallucinations -increased thirst -mental depression, mood swings, mistaken feelings of self importance or of being mistreated -pain in hips, back, ribs, arms, shoulders, or legs -pain, redness, or irritation at the injection site -redness, blistering, peeling or loosening of the skin, including inside the mouth -rounding out of face -swelling of feet or lower legs -unusual bleeding or bruising -unusual tired or weak -wounds that do not heal Side effects that usually do not require medical attention (report to your doctor or health care professional if they continue or are bothersome): -diarrhea or constipation -change in taste -headache -nausea, vomiting -skin problems, acne, thin and shiny skin -touble sleeping -unusual growth of hair on the face or body -weight gain This list may not  describe all possible side effects. Call your doctor for medical advice about side effects. You may report side effects to FDA at 1-800-FDA-1088. Where should I keep my medicine? This drug is given in a hospital or clinic and will not be stored at home. NOTE: This sheet is a summary. It may not cover all possible information. If you have questions about this medicine, talk to your doctor, pharmacist, or health care  provider.  2014, [employer] Standard. (2007-10-03 14:04:12) Ondansetron injection What is this medicine? ONDANSETRON (on DAN se tron) is used to treat nausea and vomiting caused by chemotherapy. It is also used to prevent or treat nausea and vomiting after surgery. This medicine may be used for other purposes; ask your health care provider or pharmacist if you have questions. COMMON BRAND NAME(S): Zofran What should I tell my health care provider before I take this medicine? They need to know if you have any of these conditions: -heart disease -history of irregular heartbeat -liver disease -low levels of magnesium or potassium in the blood -an unusual or allergic reaction to ondansetron, granisetron, other medicines, foods, dyes, or preservatives -pregnant or trying to get pregnant -breast-feeding How should I use this medicine? This medicine is for infusion into a vein. It is given by a health care professional in a hospital or clinic setting. Talk to your pediatrician regarding the use of this medicine in children. Special care may be needed. Overdosage: If you think you have taken too much of this medicine contact a poison control center or emergency room at once. NOTE: This medicine is only for you. Do not share this medicine with others. What if I miss a dose? This does not apply. What may interact with this medicine? Do not take this medicine with any of the following medications: -apomorphine -certain medicines for fungal infections like fluconazole, itraconazole,  ketoconazole, posaconazole, voriconazole -cisapride -dofetilide -dronedarone -pimozide -thioridazine -ziprasidone  This medicine may also interact with the following medications: -carbamazepine -certain medicines for depression, anxiety, or psychotic disturbances -fentanyl -linezolid -MAOIs like Carbex, Eldepryl, Marplan, Nardil, and Parnate -methylene blue (injected into a vein) -other medicines that prolong the QT interval (cause an abnormal heart rhythm) -phenytoin -rifampicin -tramadol This list may not describe all possible interactions. Give your health care provider a list of all the medicines, herbs, non-prescription drugs, or dietary supplements you use. Also tell them if you smoke, drink alcohol, or use illegal drugs. Some items may interact with your medicine. What should I watch for while using this medicine? Your condition will be monitored carefully while you are receiving this medicine. What side effects may I notice from receiving this medicine? Side effects that you should report to your doctor or health care professional as soon as possible: -allergic reactions like skin rash, itching or hives, swelling of the face, lips, or tongue -breathing problems -confusion -dizziness -fast or irregular heartbeat -feeling faint or lightheaded, falls -fever and chills -loss of balance or coordination -seizures -sweating -swelling of the hands and feet -tightness in the chest -tremors -unusually weak or tired Side effects that usually do not require medical attention (report to your doctor or health care professional if they continue or are bothersome): -constipation or diarrhea -headache This list may not describe all possible side effects. Call your doctor for medical advice about side effects. You may report side effects to FDA at 1-800-FDA-1088. Where should I keep my medicine? This drug is given in a hospital or clinic and will not be stored at home. NOTE: This  sheet is a summary. It may not cover all possible information. If you have questions about this medicine, talk to your doctor, pharmacist, or health care provider.  2014, [employer] Standard. (2013-03-19 16:18:28) Diphenhydramine injection What is this medicine? DIPHENHYDRAMINE (dye fen HYE dra meen) is an antihistamine. It is used to treat the symptoms of an allergic reaction and motion sickness. It is also used to treat Parkinson's disease. This  medicine may be used for other purposes; ask your health care provider or pharmacist if you have questions. COMMON BRAND NAME(S): Benadryl What should I tell my health care provider before I take this medicine? They need to know if you have any of these conditions: -asthma or lung disease -glaucoma -high blood pressure or heart disease -liver disease -pain or difficulty passing urine -prostate trouble -ulcers or other stomach problems -an unusual or allergic reaction to diphenhydramine, antihistamines, other medicines foods, dyes, or preservatives -pregnant or trying to get pregnant -breast-feeding How should I use this medicine? This medicine is for injection into a vein or a muscle. It is usually given by a health care professional in a hospital or clinic setting. If you get this medicine at home, you will be taught how to prepare and give this medicine. Use exactly as directed. Take your medicine at regular intervals. Do not take your medicine more often than directed. It is important that you put your used needles and syringes in a special sharps container. Do not put them in a trash can. If you do not have a sharps container, call your pharmacist or healthcare provider to get one. Talk to your pediatrician regarding the use of this medicine in children. While this drug may be prescribed for selected conditions, precautions do apply. This medicine is not approved for use in newborns and premature babies. Patients over 8 years old may have a  stronger reaction and need a smaller dose. Overdosage: If you think you have taken too much of this medicine contact a poison control center or emergency room at once. NOTE: This medicine is only for you. Do not share this medicine with others. What if I miss a dose? If you miss a dose, take it as soon as you can. If it is almost time for your next dose, take only that dose. Do not take double or extra doses. What may interact with this medicine? Do not take this medicine with any of the following medications: -MAOIs like Carbex, Eldepryl, Marplan, Nardil, and Parnate This medicine may also interact with the following medications: -alcohol -barbiturates, like phenobarbital -medicines for bladder spasm like oxybutynin, tolterodine -medicines for blood pressure -medicines for depression, anxiety, or psychotic disturbances -medicines for movement abnormalities or Parkinson's disease -medicines for sleep -other medicines for cold, cough or allergy -some medicines for the stomach like chlordiazepoxide, dicyclomine This list may not describe all possible interactions. Give your health care provider a list of all the medicines, herbs, non-prescription drugs, or dietary supplements you use. Also tell them if you smoke, drink alcohol, or use illegal drugs. Some items may interact with your medicine. What should I watch for while using this medicine? Your condition will be monitored carefully while you are receiving this medicine. Tell your doctor or healthcare professional if your symptoms do not start to get better or if they get worse. You may get drowsy or dizzy. Do not drive, use machinery, or do anything that needs mental alertness until you know how this medicine affects you. Do not stand or sit up quickly, especially if you are an older patient. This reduces the risk of dizzy or fainting spells. Alcohol may interfere with the effect of this medicine. Avoid alcoholic drinks. Your mouth may get  dry. Chewing sugarless gum or sucking hard candy, and drinking plenty of water may help. Contact your doctor if the problem does not go away or is severe. What side effects may I notice from receiving this medicine?  Side effects that you should report to your doctor or health care professional as soon as possible: -allergic reactions like skin rash, itching or hives, swelling of the face, lips, or tongue -breathing problems -changes in vision -chills -confused, agitated, nervous -irregular or fast heartbeat -low blood pressure -seizures -tremor -trouble passing urine -unusual bleeding or bruising -unusually weak or tired Side effects that usually do not require medical attention (report to your doctor or health care professional if they continue or are bothersome): -constipation, diarrhea -drowsy -headache -loss of appetite -stomach upset, vomiting -sweating -thick mucous This list may not describe all possible side effects. Call your doctor for medical advice about side effects. You may report side effects to FDA at 1-800-FDA-1088. Where should I keep my medicine? Keep out of the reach of children. If you are using this medicine at home, you will be instructed on how to store this medicine. Throw away any unused medicine after the expiration date on the label. NOTE: This sheet is a summary. It may not cover all possible information. If you have questions about this medicine, talk to your doctor, pharmacist, or health care provider.  2014, [employer] Standard. (2007-10-01 14:28:35) Famotidine injection What is this medicine? FAMOTIDINE (fa MOE ti deen) is a type of antihistamine that blocks the release of stomach acid. It is used to treat stomach or intestinal ulcers. It can relieve ulcer pain and discomfort, and the heartburn from acid reflux. This medicine may be used for other purposes; ask your health care provider or pharmacist if you have questions. COMMON BRAND NAME(S):  Pepcid What should I tell my health care provider before I take this medicine? They need to know if you have any of these conditions: -kidney or liver disease -an unusual or allergic reaction to famotidine, other medicines, foods, dyes, or preservatives -pregnant or trying to get pregnant -breast-feeding How should I use this medicine? This medicine is for infusion into a vein. It is given by a health care professional in a hospital or clinic setting. Talk to your pediatrician regarding the use of this medicine in children. Special care may be needed. Overdosage: If you think you have taken too much of this medicine contact a poison control center or emergency room at once. NOTE: This medicine is only for you. Do not share this medicine with others. What if I miss a dose? This does not apply. What may interact with this medicine? -delavirdine -itraconazole -ketoconazole This list may not describe all possible interactions. Give your health care provider a list of all the medicines, herbs, non-prescription drugs, or dietary supplements you use. Also tell them if you smoke, drink alcohol, or use illegal drugs. Some items may interact with your medicine. What should I watch for while using this medicine? Tell your doctor or health care professional if your condition does not start to get better or gets worse. Do not take with aspirin, ibuprofen, or other antiinflammatory medicines. These can aggravate your condition. Do not smoke cigarettes or drink alcohol. These increase irritation in your stomach and can increase the time it will take for ulcers to heal. Cigarettes and alcohol can also worsen acid reflux or heartburn. If you get black, tarry stools or vomit up what looks like coffee grounds, call your doctor or health care professional at once. You may have a bleeding ulcer. What side effects may I notice from receiving this medicine? Side effects that you should report to your doctor or  health care professional as soon as possible: -  allergic reactions like skin rash, itching or hives, swelling of the face, lips, or tongue -agitation, nervousness -confusion -hallucinations Side effects that usually do not require medical attention (report to your doctor or health care professional if they continue or are bothersome): -constipation -diarrhea -dizziness -headache This list may not describe all possible side effects. Call your doctor for medical advice about side effects. You may report side effects to FDA at 1-800-FDA-1088. Where should I keep my medicine? This medicine is given in a hospital or clinic. You will not be given this medicine to store at home. NOTE: This sheet is a summary. It may not cover all possible information. If you have questions about this medicine, talk to your doctor, pharmacist, or health care provider.  2014, [employer] Standard. (2007-10-16 13:24:51) Metoclopramide tablets What is this medicine? METOCLOPRAMIDE (met oh kloe PRA mide) is used to treat the symptoms of gastroesophageal reflux disease (GERD) like heartburn. It is also used to treat people with slow emptying of the stomach and intestinal tract. This medicine may be used for other purposes; ask your health care provider or pharmacist if you have questions. COMMON BRAND NAME(S): Reglan What should I tell my health care provider before I take this medicine? They need to know if you have any of these conditions: -breast cancer -depression -diabetes -heart failure -high blood pressure -kidney disease -liver disease -Parkinson's disease or a movement disorder -pheochromocytoma -seizures -stomach obstruction, bleeding, or perforation -an unusual or allergic reaction to metoclopramide, procainamide, sulfites, other medicines, foods, dyes, or preservatives -pregnant or trying to get pregnant -breast-feeding How should I use this medicine? Take this medicine by mouth with a glass of  water. Follow the directions on the prescription label. Take this medicine on an empty stomach, about 30 minutes before eating. Take your doses at regular intervals. Do not take your medicine more often than directed. Do not stop taking except on the advice of your doctor or health care professional. A special MedGuide will be given to you by the pharmacist with each prescription and refill. Be sure to read this information carefully each time. Talk to your pediatrician regarding the use of this medicine in children. Special care may be needed. Overdosage: If you think you have taken too much of this medicine contact a poison control center or emergency room at once. NOTE: This medicine is only for you. Do not share this medicine with others. What if I miss a dose? If you miss a dose, take it as soon as you can. If it is almost time for your next dose, take only that dose. Do not take double or extra doses. What may interact with this medicine? -acetaminophen -cyclosporine -digoxin -medicines for blood pressure -medicines for diabetes, including insulin -medicines for hay fever and other allergies -medicines for depression, especially an Monoamine Oxidase Inhibitor (MAOI) -medicines for Parkinson's disease, like levodopa -medicines for sleep or for pain -tetracycline This list may not describe all possible interactions. Give your health care provider a list of all the medicines, herbs, non-prescription drugs, or dietary supplements you use. Also tell them if you smoke, drink alcohol, or use illegal drugs. Some items may interact with your medicine. What should I watch for while using this medicine? It may take a few weeks for your stomach condition to start to get better. However, do not take this medicine for longer than 12 weeks. The longer you take this medicine, and the more you take it, the greater your chances are of developing serious  side effects. If you are an elderly patient, a female  patient, or you have diabetes, you may be at an increased risk for side effects from this medicine. Contact your doctor immediately if you start having movements you cannot control such as lip smacking, rapid movements of the tongue, involuntary or uncontrollable movements of the eyes, head, arms and legs, or muscle twitches and spasms. Patients and their families should watch out for worsening depression or thoughts of suicide. Also watch out for any sudden or severe changes in feelings such as feeling anxious, agitated, panicky, irritable, hostile, aggressive, impulsive, severely restless, overly excited and hyperactive, or not being able to sleep. If this happens, especially at the beginning of treatment or after a change in dose, call your doctor. Do not treat yourself for high fever. Ask your doctor or health care professional for advice. You may get drowsy or dizzy. Do not drive, use machinery, or do anything that needs mental alertness until you know how this drug affects you. Do not stand or sit up quickly, especially if you are an older patient. This reduces the risk of dizzy or fainting spells. Alcohol can make you more drowsy and dizzy. Avoid alcoholic drinks. What side effects may I notice from receiving this medicine? Side effects that you should report to your doctor or health care professional as soon as possible: -allergic reactions like skin rash, itching or hives, swelling of the face, lips, or tongue -abnormal production of milk in females -breast enlargement in both males and females -change in the way you walk -difficulty moving, speaking or swallowing -drooling, lip smacking, or rapid movements of the tongue -excessive sweating -fever -involuntary or uncontrollable movements of the eyes, head, arms and legs -irregular heartbeat or palpitations -muscle twitches and spasms -unusually weak or tired Side effects that usually do not require medical attention (report to your doctor  or health care professional if they continue or are bothersome): -change in sex drive or performance -depressed mood -diarrhea -difficulty sleeping -headache -menstrual changes -restless or nervous This list may not describe all possible side effects. Call your doctor for medical advice about side effects. You may report side effects to FDA at 1-800-FDA-1088. Where should I keep my medicine? Keep out of the reach of children. Store at room temperature between 20 and 25 degrees C (68 and 77 degrees F). Protect from light. Keep container tightly closed. Throw away any unused medicine after the expiration date. NOTE: This sheet is a summary. It may not cover all possible information. If you have questions about this medicine, talk to your doctor, pharmacist, or health care provider.  2014, [employer] Standard. (2011-10-10 13:04:38) Prochlorperazine tablets What is this medicine? PROCHLORPERAZINE (proe klor PER a zeen) helps to control severe nausea and vomiting. This medicine is also used to treat schizophrenia. It can also help patients who experience anxiety that is not due to psychological illness. This medicine may be used for other purposes; ask your health care provider or pharmacist if you have questions. COMMON BRAND NAME(S): Compazine What should I tell my health care provider before I take this medicine? They need to know if you have any of these conditions: -blood disorders or disease -dementia -liver disease or jaundice -Parkinson's disease -uncontrollable movement disorder -an unusual or allergic reaction to prochlorperazine, other medicines, foods, dyes, or preservatives -pregnant or trying to get pregnant -breast-feeding How should I use this medicine? Take this medicine by mouth with a glass of water. Follow the directions on the prescription  label. Take your doses at regular intervals. Do not take your medicine more often than directed. Do not stop taking this medicine  suddenly. This can cause nausea, vomiting, and dizziness. Ask your doctor or health care professional for advice. Talk to your pediatrician regarding the use of this medicine in children. Special care may be needed. While this drug may be prescribed for children as young as 2 years for selected conditions, precautions do apply. Overdosage: If you think you have taken too much of this medicine contact a poison control center or emergency room at once. NOTE: This medicine is only for you. Do not share this medicine with others. What if I miss a dose? If you miss a dose, take it as soon as you can. If it is almost time for your next dose, take only that dose. Do not take double or extra doses. What may interact with this medicine? Do not take this medicine with any of the following medications: -amoxapine -antidepressants like citalopram, escitalopram, fluoxetine, paroxetine, and sertraline -deferoxamine -dofetilide -maprotiline -tricyclic antidepressants like amitriptyline, clomipramine, imipramine, nortiptyline and others This medicine may also interact with the following medications: -lithium -medicines for pain -phenytoin -propranolol -warfarin This list may not describe all possible interactions. Give your health care provider a list of all the medicines, herbs, non-prescription drugs, or dietary supplements you use. Also tell them if you smoke, drink alcohol, or use illegal drugs. Some items may interact with your medicine. What should I watch for while using this medicine? Visit your doctor or health care professional for regular checks on your progress. You may get drowsy or dizzy. Do not drive, use machinery, or do anything that needs mental alertness until you know how this medicine affects you. Do not stand or sit up quickly, especially if you are an older patient. This reduces the risk of dizzy or fainting spells. Alcohol may interfere with the effect of this medicine. Avoid alcoholic  drinks. This medicine can reduce the response of your body to heat or cold. Dress warm in cold weather and stay hydrated in hot weather. If possible, avoid extreme temperatures like saunas, hot tubs, very hot or cold showers, or activities that can cause dehydration such as vigorous exercise. This medicine can make you more sensitive to the sun. Keep out of the sun. If you cannot avoid being in the sun, wear protective clothing and use sunscreen. Do not use sun lamps or tanning beds/booths. Your mouth may get dry. Chewing sugarless gum or sucking hard candy, and drinking plenty of water may help. Contact your doctor if the problem does not go away or is severe. What side effects may I notice from receiving this medicine? Side effects that you should report to your doctor or health care professional as soon as possible: -blurred vision -breast enlargement in men or women -breast milk in women who are not breast-feeding -chest pain, fast or irregular heartbeat -confusion, restlessness -dark yellow or brown urine -difficulty breathing or swallowing -dizziness or fainting spells -drooling, shaking, movement difficulty (shuffling walk) or rigidity -fever, chills, sore throat -involuntary or uncontrollable movements of the eyes, mouth, head, arms, and legs -seizures -stomach area pain -unusually weak or tired -unusual bleeding or bruising -yellowing of skin or eyes Side effects that usually do not require medical attention (report to your doctor or health care professional if they continue or are bothersome): -difficulty passing urine -difficulty sleeping -headache -sexual dysfunction -skin rash, or itching This list may not describe all possible side effects.  Call your doctor for medical advice about side effects. You may report side effects to FDA at 1-800-FDA-1088. Where should I keep my medicine? Keep out of the reach of children. Store at room temperature between 15 and 30 degrees C (59  and 86 degrees F). Protect from light. Throw away any unused medicine after the expiration date. NOTE: This sheet is a summary. It may not cover all possible information. If you have questions about this medicine, talk to your doctor, pharmacist, or health care provider.  2014, Elsevier/Gold Standard. (2011-10-31 16:59:39) Lidocaine; Prilocaine cream What is this medicine? LIDOCAINE; PRILOCAINE (LYE doe kane; PRIL oh kane) is a topical anesthetic that causes loss of feeling in the skin and surrounding tissues. It is used to numb the skin before procedures or injections. This medicine may be used for other purposes; ask your health care provider or pharmacist if you have questions. COMMON BRAND NAME(S): EMLA What should I tell my health care provider before I take this medicine? They need to know if you have any of these conditions: -glucose-6-phosphate deficiencies -heart disease -kidney or liver disease -methemoglobinemia -an unusual or allergic reaction to lidocaine, prilocaine, other medicines, foods, dyes, or preservatives -pregnant or trying to get pregnant -breast-feeding How should I use this medicine? This medicine is for external use only on the skin. Do not take by mouth. Follow the directions on the prescription label. Wash hands before and after use. Do not use more or leave in contact with the skin longer than directed. Do not apply to eyes or open wounds. It can cause irritation and blurred or temporary loss of vision. If this medicine comes in contact with your eyes, immediately rinse the eye with water. Do not touch or rub the eye. Contact your health care provider right away. Talk to your pediatrician regarding the use of this medicine in children. While this medicine may be prescribed for children for selected conditions, precautions do apply. Overdosage: If you think you have taken too much of this medicine contact a poison control center or emergency room at once. NOTE: This  medicine is only for you. Do not share this medicine with others. What if I miss a dose? This medicine is usually only applied once prior to each procedure. It must be in contact with the skin for a period of time for it to work. If you applied this medicine later than directed, tell your health care professional before starting the procedure. What may interact with this medicine? -acetaminophen -chloroquine -dapsone -medicines to control heart rhythm -nitrates like nitroglycerin and nitroprusside -other ointments, creams, or sprays that may contain anesthetic medicine -phenobarbital -phenytoin -quinine -sulfonamides like sulfacetamide, sulfamethoxazole, sulfasalazine and others This list may not describe all possible interactions. Give your health care provider a list of all the medicines, herbs, non-prescription drugs, or dietary supplements you use. Also tell them if you smoke, drink alcohol, or use illegal drugs. Some items may interact with your medicine. What should I watch for while using this medicine? Be careful to avoid injury to the treated area while it is numb and you are not aware of pain. Avoid scratching, rubbing, or exposing the treated area to hot or cold temperatures until complete sensation has returned. The numb feeling will wear off a few hours after applying the cream. What side effects may I notice from receiving this medicine? Side effects that you should report to your doctor or health care professional as soon as possible: -blurred vision -chest pain -difficulty breathing -  dizziness -drowsiness -fast or irregular heartbeat -skin rash or itching -swelling of your throat, lips, or face -trembling Side effects that usually do not require medical attention (report to your doctor or health care professional if they continue or are bothersome): -changes in ability to feel hot or cold -redness and swelling at the application site This list may not describe all  possible side effects. Call your doctor for medical advice about side effects. You may report side effects to FDA at 1-800-FDA-1088. Where should I keep my medicine? Keep out of reach of children. Store at room temperature between 15 and 30 degrees C (59 and 86 degrees F). Keep container tightly closed. Throw away any unused medicine after the expiration date. NOTE: This sheet is a summary. It may not cover all possible information. If you have questions about this medicine, talk to your doctor, pharmacist, or health care provider.  2014, Elsevier/Gold Standard. (2007-12-16 17:14:35)

## 2013-07-31 ENCOUNTER — Other Ambulatory Visit (HOSPITAL_COMMUNITY): Payer: Self-pay | Admitting: *Deleted

## 2013-07-31 DIAGNOSIS — K219 Gastro-esophageal reflux disease without esophagitis: Secondary | ICD-10-CM | POA: Diagnosis not present

## 2013-07-31 DIAGNOSIS — Z888 Allergy status to other drugs, medicaments and biological substances status: Secondary | ICD-10-CM | POA: Diagnosis not present

## 2013-07-31 DIAGNOSIS — C3490 Malignant neoplasm of unspecified part of unspecified bronchus or lung: Secondary | ICD-10-CM | POA: Diagnosis not present

## 2013-07-31 DIAGNOSIS — E785 Hyperlipidemia, unspecified: Secondary | ICD-10-CM | POA: Diagnosis not present

## 2013-07-31 DIAGNOSIS — Z885 Allergy status to narcotic agent status: Secondary | ICD-10-CM | POA: Diagnosis not present

## 2013-07-31 DIAGNOSIS — Z510 Encounter for antineoplastic radiation therapy: Secondary | ICD-10-CM | POA: Diagnosis not present

## 2013-07-31 DIAGNOSIS — Z7982 Long term (current) use of aspirin: Secondary | ICD-10-CM | POA: Diagnosis not present

## 2013-07-31 DIAGNOSIS — C3430 Malignant neoplasm of lower lobe, unspecified bronchus or lung: Secondary | ICD-10-CM | POA: Diagnosis not present

## 2013-07-31 DIAGNOSIS — I10 Essential (primary) hypertension: Secondary | ICD-10-CM | POA: Diagnosis not present

## 2013-07-31 DIAGNOSIS — F17200 Nicotine dependence, unspecified, uncomplicated: Secondary | ICD-10-CM | POA: Diagnosis not present

## 2013-07-31 DIAGNOSIS — Z886 Allergy status to analgesic agent status: Secondary | ICD-10-CM | POA: Diagnosis not present

## 2013-07-31 DIAGNOSIS — E039 Hypothyroidism, unspecified: Secondary | ICD-10-CM | POA: Diagnosis not present

## 2013-07-31 DIAGNOSIS — Z809 Family history of malignant neoplasm, unspecified: Secondary | ICD-10-CM | POA: Diagnosis not present

## 2013-07-31 DIAGNOSIS — Z800 Family history of malignant neoplasm of digestive organs: Secondary | ICD-10-CM | POA: Diagnosis not present

## 2013-07-31 DIAGNOSIS — Z79899 Other long term (current) drug therapy: Secondary | ICD-10-CM | POA: Diagnosis not present

## 2013-08-02 ENCOUNTER — Other Ambulatory Visit: Payer: Self-pay | Admitting: Family Medicine

## 2013-08-05 ENCOUNTER — Other Ambulatory Visit: Payer: Self-pay | Admitting: Family Medicine

## 2013-08-05 ENCOUNTER — Encounter (HOSPITAL_COMMUNITY): Payer: Medicare Other | Attending: Hematology and Oncology

## 2013-08-05 DIAGNOSIS — K219 Gastro-esophageal reflux disease without esophagitis: Secondary | ICD-10-CM | POA: Diagnosis not present

## 2013-08-05 DIAGNOSIS — C3490 Malignant neoplasm of unspecified part of unspecified bronchus or lung: Secondary | ICD-10-CM | POA: Insufficient documentation

## 2013-08-05 DIAGNOSIS — I10 Essential (primary) hypertension: Secondary | ICD-10-CM | POA: Diagnosis not present

## 2013-08-05 DIAGNOSIS — R222 Localized swelling, mass and lump, trunk: Secondary | ICD-10-CM | POA: Insufficient documentation

## 2013-08-05 DIAGNOSIS — Z510 Encounter for antineoplastic radiation therapy: Secondary | ICD-10-CM | POA: Diagnosis not present

## 2013-08-05 DIAGNOSIS — E785 Hyperlipidemia, unspecified: Secondary | ICD-10-CM | POA: Diagnosis not present

## 2013-08-05 DIAGNOSIS — C3430 Malignant neoplasm of lower lobe, unspecified bronchus or lung: Secondary | ICD-10-CM

## 2013-08-05 DIAGNOSIS — R918 Other nonspecific abnormal finding of lung field: Secondary | ICD-10-CM

## 2013-08-05 DIAGNOSIS — E039 Hypothyroidism, unspecified: Secondary | ICD-10-CM | POA: Diagnosis not present

## 2013-08-05 NOTE — Progress Notes (Signed)
Chemo teaching done and consent signed for Carboplatin/Taxol. Chemo and med calendar given to patient.

## 2013-08-06 ENCOUNTER — Inpatient Hospital Stay (HOSPITAL_COMMUNITY): Payer: Medicare Other

## 2013-08-06 ENCOUNTER — Ambulatory Visit (HOSPITAL_COMMUNITY): Payer: Medicare Other | Admitting: Oncology

## 2013-08-06 DIAGNOSIS — I10 Essential (primary) hypertension: Secondary | ICD-10-CM | POA: Diagnosis not present

## 2013-08-06 DIAGNOSIS — C3490 Malignant neoplasm of unspecified part of unspecified bronchus or lung: Secondary | ICD-10-CM | POA: Diagnosis not present

## 2013-08-06 DIAGNOSIS — E039 Hypothyroidism, unspecified: Secondary | ICD-10-CM | POA: Diagnosis not present

## 2013-08-06 DIAGNOSIS — E785 Hyperlipidemia, unspecified: Secondary | ICD-10-CM | POA: Diagnosis not present

## 2013-08-06 DIAGNOSIS — C3430 Malignant neoplasm of lower lobe, unspecified bronchus or lung: Secondary | ICD-10-CM | POA: Diagnosis not present

## 2013-08-06 DIAGNOSIS — Z510 Encounter for antineoplastic radiation therapy: Secondary | ICD-10-CM | POA: Diagnosis not present

## 2013-08-06 DIAGNOSIS — K219 Gastro-esophageal reflux disease without esophagitis: Secondary | ICD-10-CM | POA: Diagnosis not present

## 2013-08-06 NOTE — Telephone Encounter (Signed)
Last seen 07/14/13

## 2013-08-07 ENCOUNTER — Inpatient Hospital Stay (HOSPITAL_COMMUNITY): Payer: Medicare Other

## 2013-08-07 ENCOUNTER — Ambulatory Visit (HOSPITAL_COMMUNITY): Payer: Medicare Other | Admitting: Oncology

## 2013-08-07 ENCOUNTER — Encounter (HOSPITAL_COMMUNITY): Payer: Self-pay | Admitting: Oncology

## 2013-08-07 DIAGNOSIS — I10 Essential (primary) hypertension: Secondary | ICD-10-CM | POA: Diagnosis not present

## 2013-08-07 DIAGNOSIS — Z510 Encounter for antineoplastic radiation therapy: Secondary | ICD-10-CM | POA: Diagnosis not present

## 2013-08-07 DIAGNOSIS — C3490 Malignant neoplasm of unspecified part of unspecified bronchus or lung: Secondary | ICD-10-CM | POA: Diagnosis not present

## 2013-08-07 DIAGNOSIS — E785 Hyperlipidemia, unspecified: Secondary | ICD-10-CM | POA: Diagnosis not present

## 2013-08-07 DIAGNOSIS — C3430 Malignant neoplasm of lower lobe, unspecified bronchus or lung: Secondary | ICD-10-CM | POA: Diagnosis not present

## 2013-08-07 DIAGNOSIS — K219 Gastro-esophageal reflux disease without esophagitis: Secondary | ICD-10-CM | POA: Diagnosis not present

## 2013-08-07 DIAGNOSIS — E039 Hypothyroidism, unspecified: Secondary | ICD-10-CM | POA: Diagnosis not present

## 2013-08-07 NOTE — Progress Notes (Signed)
[name], MD [place] [address]  Squamous cell carcinoma of left lung  CURRENT THERAPY: To start chemoradiation with weekly Paclitaxel on 08/07/2013  INTERVAL HISTORY: [name] 79 y.o. female returns for  regular  visit for followup of Stage IIIB squamous cell carcinoma with a 4.1 cm left lower lobe lesion, left hilar and bilateral mediastinal lymphadenopathy (questionable hepatic lesion and left upper lobe of lung lesion).  To start chemoradiation with weekly Paclitaxel on 08/07/2013.    Squamous cell carcinoma of left lung   06/30/2013 Imaging CT of chest demonstrates 4 cm left lower lobe lesion with other concerning findings for malignancy   07/09/2013 Imaging PET- 4.1 cm left lower oobe lesion with left hilar and bilateral mediastinal lymphadeopathy.  Hepatic lesion not confidently identified.  1.8 cm nodule in left upper lobe, cannot exclude low grade tumor.   07/16/2013 Initial Diagnosis Squamous cell carcinoma of left lung via CT-guided biopsy by IR   07/29/2013 Surgery IR placement of port-a-cath in preparation for concomittant chemoradiation    08/08/2013 -  Chemotherapy Paclitaxel/Carboplatin weekly x 6   I personally reviewed and went over laboratory results with the patient.  The results are noted within this dictation.  I personally reviewed and went over radiographic studies with the patient.  The results are noted within this dictation.    Today is [name]'s first cycle of chemotherapy.  She is nervous secondary to the start of this journey.  She was provided education regarding the first cycle being longer than the next due to a slow Paclitaxel rate of infusion for the first cycle.  She was educated regarding nausea and vomiting.  We plan on treating this aggressively if necessary.  I have encouraged her to hopefully decrease her anxiety which can certainly induce nausea/vomiting.    All questions were answered and she denies any complaints today.     Past Medical History  Diagnosis Date  . Hypertension   . Hypothyroidism   . Hyperlipidemia   . Insomnia   . GERD (gastroesophageal reflux disease)     chronic gastritis  . Adrenal adenoma   . Chronic diarrhea   . Fatty liver   . Arthritis     knee  . QT prolongation     syncope  . Coronary artery disease   . Impaired fasting glucose   . Renal insufficiency     low protien diet  . Tobacco use     1/2 ppd, approx 25-50 pack years (as of 08/2012)  . Family history of heart disease   . IBS (irritable bowel syndrome)   . Peripheral arterial disease     sstatus post  infrarenal abdominal aortic tube graft placed by Dr. Victorino Dike February 2008  . Cancer     Lung  . Squamous cell carcinoma of left lung 07/29/2013    has Unspecified hypothyroidism; Pruritic dermatitis; HTN (hypertension); Chronic renal insufficiency; Other and unspecified hyperlipidemia; Generalized anxiety disorder; Insomnia; Esophageal reflux; Coronary artery disease; Essential hypertension; Hyperlipidemia; Peripheral arterial disease; Anemia, iron deficiency; Hemoptysis; and Squamous cell carcinoma of left lung on her problem list.     is allergic to aleve; dilaudid; dyazide; and zithromax.  Ms. Camberos does not currently have medications on file.  Past Surgical History  Procedure Laterality Date  . Abdominal hysterectomy    . Thyroidectomy, partial    . Colonoscopy  5/04    normal  . Abdominal aortic aneurysm repair  07/2006  D. J.D. Kellie Simmering  . Cataract extraction Bilateral 2010  . Cardiac catheterization  02/2010    mod CAD in L-dominant system with normal LV function  . Colon resection  2005    1/2 colon removed  . Transthoracic echocardiogram  02/2010    EF 55-60%; mild conc LVH, normal systolic function; mildly calcified AV annulus  . Colonoscopy with esophagogastroduodenoscopy (egd) N/A 03/19/2013    Procedure: COLONOSCOPY WITH ESOPHAGOGASTRODUODENOSCOPY (EGD);  Surgeon: Rogene Houston, MD;   Location: [facility];  Service: Endoscopy;  Laterality: N/A;  145  . Dg biopsy lung Left Jan 2015    Denies any headaches, dizziness, double vision, fevers, chills, night sweats, nausea, vomiting, diarrhea, constipation, chest pain, heart palpitations, shortness of breath, blood in stool, black tarry stool, urinary pain, urinary burning, urinary frequency, hematuria.   PHYSICAL EXAMINATION  ECOG PERFORMANCE STATUS: 0 - Asymptomatic  Filed Vitals:   08/08/13 1017  BP: 147/63  Pulse: 64  Temp: 98.5 F (36.9 C)  Resp: 20    GENERAL:alert, no distress, well nourished, well developed, comfortable, cooperative and smiling, in chemotherapy bed. SKIN: skin color, texture, turgor are normal, no rashes or significant lesions HEAD: Normocephalic, No masses, lesions, tenderness or abnormalities EYES: normal, PERRLA, EOMI, Conjunctiva are pink and non-injected EARS: External ears normal OROPHARYNX:mucous membranes are moist  NECK: supple, trachea midline LYMPH:  not examined BREAST:not examined LUNGS: not examined HEART: not examined ABDOMEN:not examined BACK: not examined EXTREMITIES:less then 2 second capillary refill, no cyanosis  NEURO: alert & oriented x 3 with fluent speech, no focal motor/sensory deficits   LABORATORY DATA: CBC    Component Value Date/Time   WBC 4.9 08/08/2013 1015   RBC 3.18* 08/08/2013 1015   HGB 10.6* 08/08/2013 1015   HCT 31.7* 08/08/2013 1015   PLT 253 08/08/2013 1015   MCV 99.7 08/08/2013 1015   MCH 33.3 08/08/2013 1015   MCHC 33.4 08/08/2013 1015   RDW 14.0 08/08/2013 1015   LYMPHSABS 0.9 08/08/2013 1015   MONOABS 0.6 08/08/2013 1015   EOSABS 0.2 08/08/2013 1015   BASOSABS 0.0 08/08/2013 1015      Chemistry      Component Value Date/Time   NA 140 08/08/2013 1015   K 3.2* 08/08/2013 1015   CL 101 08/08/2013 1015   CO2 28 08/08/2013 1015   BUN 14 08/08/2013 1015   CREATININE 1.88* 08/08/2013 1015   CREATININE 2.03* 05/13/2013 0941      Component Value  Date/Time   CALCIUM 9.3 08/08/2013 1015   ALKPHOS 56 08/08/2013 1015   AST 19 08/08/2013 1015   ALT 8 08/08/2013 1015   BILITOT 0.4 08/08/2013 1015       RADIOGRAPHIC STUDIES:  07/09/2013  CLINICAL DATA: Initial treatment strategy for lung mass.  EXAM:  NUCLEAR MEDICINE PET SKULL BASE TO THIGH  FASTING BLOOD GLUCOSE: Value: 89mg /dl  TECHNIQUE:  16.6 mCi F-18 FDG was injected intravenously. CT data was obtained  and used for attenuation correction and anatomic localization only.  (This was not acquired as a diagnostic CT examination.) Additional  exam technical data entered on technologist worksheet.  COMPARISON: Chest CT 06/25/2013  FINDINGS:  NECK  No hypermetabolic lymph nodes in the neck. Low-attenuation lesion in  the left maxillary sinus may represent a mucosal retention cyst or  polyp.  CHEST  In the posterior aspect of the left lower lobe there is a 4.1 x 3.7  cm mass with macrolobulated margins, central cavitation and large  base of contact  with the overlying pleura, which is diffusely  hypermetabolic (SUVmax = 37.1). There is also an ill-defined nodular  ground-glass attenuation opacity in the posterolateral aspect of the  left upper lobe in contact with the overlying pleura measuring 18 x  10 mm (image 74 of series 2), without associated hypermetabolism.  1.4 cm short axis left infrahilar lymph node (SUVmax = 7.0). 8 mm AP  window lymph node (SUVmax = 5.2). 1 cm short axis low right  paratracheal lymph node (SUVmax = 4.4). 11 mm short axis subcarinal  lymph node (SUVmax = 4.9). Heart size is mildly enlarged. There is  no significant pericardial fluid, thickening or pericardial  calcification. There is atherosclerosis of the thoracic aorta, the  great vessels of the mediastinum and the coronary arteries,  including calcified atherosclerotic plaque in the left main, left  anterior descending and left circumflex coronary arteries. Mild  moderate centrilobular and  paraseptal emphysema, most pronounced  throughout the lung apices.  ABDOMEN/PELVIS  No abnormal hypermetabolic activity within the liver, pancreas,  right adrenal gland, or spleen. The left adrenal gland is enlarged  and low-attenuation (3.3 cm in diameter and 4 HU), demonstrating  some diffuse low level metabolic activity (SUVmax = 3.5), only  slightly larger than remote prior studies dating back to 09/12/2006,  presumably an adenoma. No hypermetabolic lymph nodes in the abdomen  or pelvis. Status post right hemicolectomy. Postoperative changes of  graft repair of the infrarenal abdominal aorta are noted. No  significant volume of ascites. No pneumoperitoneum. No pathologic  distention of small bowel.  SKELETON  No focal hypermetabolic activity to suggest skeletal metastasis.  IMPRESSION:  1. Aggressive appearing the cavitary hypermetabolic 4.1 x 3.7 cm  left lower lobe mass with left hilar and bilateral mediastinal  lymphadenopathy, highly concerning for primary bronchogenic  neoplasm. This appears to represent T2a, N3, Mx disease (i.e.,  likely stage IIIB).  2. Previously described new hepatic lesions are not confidently  identified on today's examination. However, PET is insensitive for  detection of hepatic lesions, and if clinically relevant, definitive  evaluation with MRI of the abdomen with and without IV gadolinium  would be recommended to better assess for potential hepatic  metastases.  3. 1.8 x 1.0 cm ground-glass attenuation nodule in the posterior  aspect of the left upper lobe is similar to the prior study, and  warrants continued attention on future followup examinations to  exclude the possibility of a low-grade neoplasm such as an  adenocarcinoma.  4. Left adrenal adenoma redemonstrated.  Electronically Signed  By: Vinnie Langton M.D.  On: 07/09/2013 17:26    PATHOLOGY:  07/16/2013  Diagnosis Lung, needle/core biopsy(ies), Left lower lobe - SQUAMOUS  CELL CARCINOMA. - SEE COMMENT. Microscopic Comment The malignant cells are positive for cytokeratin 5/6 and p63. They are negative for Napsin-A and TTF1. These findings are consistent with squamous cell carcinoma. Dr. Lanette Hampshire has reviewed the case and concurs with this interpretation. (JBK:kh 07/18/13) Enid Cutter MD Pathologist, Electronic Signature (Case signed 07/18/2013)    ASSESSMENT:  1. Stage IIIB squamous cell carcinoma with a 4.1 cm left lower lobe lesion, left hilar and bilateral mediastinal lymphadenopathy (questionable hepatic lesion and left upper lobe of lung lesion).  To start chemoradiation with weekly Paclitaxel on 08/07/2013. 2. Chronic obstructive pulmonary disease 3. Gastroesophageal reflux disease, on treatment. 4. Hypertension, controlled. 5. Hypothyroidism, on treatment. 6. Anemia of chronic disease and chronic blood loss.   Patient Active Problem List   Diagnosis Date Noted  .  Squamous cell carcinoma of left lung 07/29/2013  . Hemoptysis 06/20/2013  . Anemia, iron deficiency 03/03/2013  . Coronary artery disease 02/28/2013  . Essential hypertension 02/28/2013  . Hyperlipidemia 02/28/2013  . Peripheral arterial disease 02/28/2013  . Other and unspecified hyperlipidemia 12/10/2012  . Generalized anxiety disorder 12/10/2012  . Insomnia 12/10/2012  . Esophageal reflux 12/10/2012  . Chronic renal insufficiency 11/07/2012  . Unspecified hypothyroidism 09/10/2012  . Pruritic dermatitis 09/10/2012  . HTN (hypertension) 09/10/2012      PLAN:  1. I personally reviewed and went over laboratory results with the patient.  The results are noted within this dictation. 2. I personally reviewed and went over radiographic studies with the patient.  The results are noted within this dictation.   3. Pre-chemo labs: CBC diff, CMET 4. Calculation of Carboplatin dose per CMET results today 5. Concomitant chemoradiation with weekly Paclitaxel as scheduled/planned. 6.  Return as scheduled for follow-up.   THERAPY PLAN:  We will start weekly Paclitaxel in addition to daily radiation as per recommended protocol by NCCN guidelines.  We will manage and treat potential upcoming toxicities related to treatment.   All questions were answered. The patient knows to call the clinic with any problems, questions or concerns. We can certainly see the patient much sooner if necessary.  Patient and plan discussed with Dr. Farrel Gobble and he is in agreement with the aforementioned.   Dellamae Rosamilia

## 2013-08-08 ENCOUNTER — Encounter (HOSPITAL_COMMUNITY): Payer: Medicare Other | Attending: Oncology | Admitting: Oncology

## 2013-08-08 ENCOUNTER — Encounter (HOSPITAL_COMMUNITY): Payer: Self-pay | Admitting: Hematology and Oncology

## 2013-08-08 ENCOUNTER — Telehealth: Payer: Self-pay | Admitting: Family Medicine

## 2013-08-08 ENCOUNTER — Encounter (HOSPITAL_BASED_OUTPATIENT_CLINIC_OR_DEPARTMENT_OTHER): Payer: Medicare Other

## 2013-08-08 VITALS — BP 147/65 | HR 67 | Temp 98.4°F | Resp 18

## 2013-08-08 VITALS — BP 147/63 | HR 64 | Temp 98.5°F | Resp 20 | Wt 164.0 lb

## 2013-08-08 DIAGNOSIS — I2510 Atherosclerotic heart disease of native coronary artery without angina pectoris: Secondary | ICD-10-CM | POA: Insufficient documentation

## 2013-08-08 DIAGNOSIS — J449 Chronic obstructive pulmonary disease, unspecified: Secondary | ICD-10-CM

## 2013-08-08 DIAGNOSIS — C3490 Malignant neoplasm of unspecified part of unspecified bronchus or lung: Secondary | ICD-10-CM

## 2013-08-08 DIAGNOSIS — I10 Essential (primary) hypertension: Secondary | ICD-10-CM | POA: Diagnosis not present

## 2013-08-08 DIAGNOSIS — D509 Iron deficiency anemia, unspecified: Secondary | ICD-10-CM | POA: Insufficient documentation

## 2013-08-08 DIAGNOSIS — E039 Hypothyroidism, unspecified: Secondary | ICD-10-CM | POA: Diagnosis not present

## 2013-08-08 DIAGNOSIS — Z510 Encounter for antineoplastic radiation therapy: Secondary | ICD-10-CM | POA: Diagnosis not present

## 2013-08-08 DIAGNOSIS — Z5111 Encounter for antineoplastic chemotherapy: Secondary | ICD-10-CM

## 2013-08-08 DIAGNOSIS — K7689 Other specified diseases of liver: Secondary | ICD-10-CM | POA: Insufficient documentation

## 2013-08-08 DIAGNOSIS — C3492 Malignant neoplasm of unspecified part of left bronchus or lung: Secondary | ICD-10-CM

## 2013-08-08 DIAGNOSIS — I739 Peripheral vascular disease, unspecified: Secondary | ICD-10-CM | POA: Insufficient documentation

## 2013-08-08 DIAGNOSIS — R222 Localized swelling, mass and lump, trunk: Secondary | ICD-10-CM | POA: Diagnosis not present

## 2013-08-08 DIAGNOSIS — F17200 Nicotine dependence, unspecified, uncomplicated: Secondary | ICD-10-CM | POA: Insufficient documentation

## 2013-08-08 DIAGNOSIS — K219 Gastro-esophageal reflux disease without esophagitis: Secondary | ICD-10-CM | POA: Insufficient documentation

## 2013-08-08 DIAGNOSIS — C3430 Malignant neoplasm of lower lobe, unspecified bronchus or lung: Secondary | ICD-10-CM | POA: Insufficient documentation

## 2013-08-08 DIAGNOSIS — J4489 Other specified chronic obstructive pulmonary disease: Secondary | ICD-10-CM

## 2013-08-08 DIAGNOSIS — E785 Hyperlipidemia, unspecified: Secondary | ICD-10-CM | POA: Diagnosis not present

## 2013-08-08 DIAGNOSIS — G4700 Insomnia, unspecified: Secondary | ICD-10-CM | POA: Insufficient documentation

## 2013-08-08 DIAGNOSIS — F411 Generalized anxiety disorder: Secondary | ICD-10-CM | POA: Insufficient documentation

## 2013-08-08 DIAGNOSIS — R599 Enlarged lymph nodes, unspecified: Secondary | ICD-10-CM | POA: Insufficient documentation

## 2013-08-08 DIAGNOSIS — R918 Other nonspecific abnormal finding of lung field: Secondary | ICD-10-CM

## 2013-08-08 LAB — CBC WITH DIFFERENTIAL/PLATELET
BASOS ABS: 0 10*3/uL (ref 0.0–0.1)
Basophils Relative: 1 % (ref 0–1)
Eosinophils Absolute: 0.2 10*3/uL (ref 0.0–0.7)
Eosinophils Relative: 4 % (ref 0–5)
HEMATOCRIT: 31.7 % — AB (ref 36.0–46.0)
HEMOGLOBIN: 10.6 g/dL — AB (ref 12.0–15.0)
Lymphocytes Relative: 19 % (ref 12–46)
Lymphs Abs: 0.9 10*3/uL (ref 0.7–4.0)
MCH: 33.3 pg (ref 26.0–34.0)
MCHC: 33.4 g/dL (ref 30.0–36.0)
MCV: 99.7 fL (ref 78.0–100.0)
MONO ABS: 0.6 10*3/uL (ref 0.1–1.0)
MONOS PCT: 11 % (ref 3–12)
NEUTROS ABS: 3.2 10*3/uL (ref 1.7–7.7)
Neutrophils Relative %: 65 % (ref 43–77)
Platelets: 253 10*3/uL (ref 150–400)
RBC: 3.18 MIL/uL — ABNORMAL LOW (ref 3.87–5.11)
RDW: 14 % (ref 11.5–15.5)
WBC: 4.9 10*3/uL (ref 4.0–10.5)

## 2013-08-08 LAB — COMPREHENSIVE METABOLIC PANEL
ALT: 8 U/L (ref 0–35)
AST: 19 U/L (ref 0–37)
Albumin: 3.6 g/dL (ref 3.5–5.2)
Alkaline Phosphatase: 56 U/L (ref 39–117)
BUN: 14 mg/dL (ref 6–23)
CALCIUM: 9.3 mg/dL (ref 8.4–10.5)
CO2: 28 mEq/L (ref 19–32)
Chloride: 101 mEq/L (ref 96–112)
Creatinine, Ser: 1.88 mg/dL — ABNORMAL HIGH (ref 0.50–1.10)
GFR calc non Af Amer: 25 mL/min — ABNORMAL LOW (ref >90)
GFR, EST AFRICAN AMERICAN: 29 mL/min — AB (ref >90)
GLUCOSE: 93 mg/dL (ref 70–99)
Potassium: 3.2 mEq/L — ABNORMAL LOW (ref 3.7–5.3)
Sodium: 140 mEq/L (ref 137–147)
TOTAL PROTEIN: 7.1 g/dL (ref 6.0–8.3)
Total Bilirubin: 0.4 mg/dL (ref 0.3–1.2)

## 2013-08-08 MED ORDER — SODIUM CHLORIDE 0.9 % IJ SOLN: 10.0000 mL | INTRAMUSCULAR | Status: DC | PRN

## 2013-08-08 MED ORDER — HEPARIN SOD (PORK) LOCK FLUSH 100 UNIT/ML IV SOLN
500.0000 [IU] | Freq: Once | INTRAVENOUS | Status: AC | PRN
  Administered 2013-08-08: 500 [IU]

## 2013-08-08 MED ORDER — FAMOTIDINE IN NACL 20-0.9 MG/50ML-% IV SOLN
20.0000 mg | Freq: Once | INTRAVENOUS | Status: AC
  Administered 2013-08-08: 20 mg via INTRAVENOUS
  Filled 2013-08-08: qty 50

## 2013-08-08 MED ORDER — SODIUM CHLORIDE 0.9 % IV SOLN: 16.0000 mg | Freq: Once | INTRAVENOUS | Status: DC

## 2013-08-08 MED ORDER — DEXAMETHASONE SODIUM PHOSPHATE 10 MG/ML IJ SOLN: 20.0000 mg | Freq: Once | INTRAMUSCULAR | Status: DC

## 2013-08-08 MED ORDER — SODIUM CHLORIDE 0.9 % IV SOLN
Freq: Once | INTRAVENOUS | Status: AC
  Administered 2013-08-08: 11:00:00 via INTRAVENOUS

## 2013-08-08 MED ORDER — DIPHENHYDRAMINE HCL 50 MG/ML IJ SOLN
50.0000 mg | Freq: Once | INTRAMUSCULAR | Status: AC
Start: 2013-08-08 — End: 2013-08-08
  Administered 2013-08-08: 50 mg via INTRAVENOUS
  Filled 2013-08-08: qty 1

## 2013-08-08 MED ORDER — ONDANSETRON HCL 40 MG/20ML IJ SOLN
Freq: Once | INTRAMUSCULAR | Status: AC
  Administered 2013-08-08: 16 mg via INTRAVENOUS
  Filled 2013-08-08: qty 8

## 2013-08-08 MED ORDER — PACLITAXEL CHEMO INJECTION 300 MG/50ML
45.0000 mg/m2 | Freq: Once | INTRAVENOUS | Status: AC
  Administered 2013-08-08: 84 mg via INTRAVENOUS
  Filled 2013-08-08: qty 14

## 2013-08-08 MED ORDER — SODIUM CHLORIDE 0.9 % IV SOLN
110.2000 mg | Freq: Once | INTRAVENOUS | Status: AC
  Administered 2013-08-08: 110 mg via INTRAVENOUS
  Filled 2013-08-08: qty 11

## 2013-08-08 MED ORDER — HEPARIN SOD (PORK) LOCK FLUSH 100 UNIT/ML IV SOLN
INTRAVENOUS | Status: AC
  Filled 2013-08-08: qty 5

## 2013-08-08 NOTE — Progress Notes (Signed)
Tolerated infusion well. 

## 2013-08-08 NOTE — Telephone Encounter (Signed)
ALPRAZolam (XANAX) 1 MG tablet  [facility] is stating that they never received copy of her last script from 07/18/13  Cab we resend this please either call it in or fax again

## 2013-08-08 NOTE — Patient Instructions (Signed)
Tolerated well

## 2013-08-08 NOTE — Telephone Encounter (Signed)
Medication called in on pharmacy voicemail. Daughter was notified.

## 2013-08-08 NOTE — Patient Instructions (Signed)
[facility] Discharge Instructions  RECOMMENDATIONS MADE BY THE CONSULTANT AND ANY TEST RESULTS WILL BE SENT TO YOUR REFERRING PHYSICIAN.  EXAM FINDINGS BY THE PHYSICIAN TODAY AND SIGNS OR SYMPTOMS TO REPORT TO CLINIC OR PRIMARY PHYSICIAN:   MEDICATIONS PRESCRIBED:  None  INSTRUCTIONS GIVEN AND DISCUSSED: Take anti-nausea/anti-vomiting medications at home as needed and as directed.  Contact the clinic with uncontrolled nausea/vomiting. Report to ED after [facility] clinic hours and over the weekend with any fevers, SOB, uncontrolled nausea/vomiting, chest pain, or other issues related or unrelated to cancer and its treatment.   SPECIAL INSTRUCTIONS/FOLLOW-UP: Follow-up as scheduled and chemotherapy with lab work as scheduled.  Thank you for choosing [facility] to provide your oncology and hematology care.  To afford each patient quality time with our providers, please arrive at least 15 minutes before your scheduled appointment time.  With your help, our goal is to use those 15 minutes to complete the necessary work-up to ensure our physicians have the information they need to help with your evaluation and healthcare recommendations.    Effective January 1st, 2014, we ask that you re-schedule your appointment with our physicians should you arrive 10 or more minutes late for your appointment.  We strive to give you quality time with our providers, and arriving late affects you and other patients whose appointments are after yours.    Again, thank you for choosing [facility].  Our hope is that these requests will decrease the amount of time that you wait before being seen by our physicians.       _____________________________________________________________  Should you have questions after your visit to [facility], please contact our office at [phone] between the hours of 8:30 a.m. and 5:00 p.m.  Voicemails  left after 4:30 p.m. will not be returned until the following business day.  For prescription refill requests, have your pharmacy contact our office with your prescription refill request.

## 2013-08-09 ENCOUNTER — Other Ambulatory Visit: Payer: Self-pay | Admitting: Family Medicine

## 2013-08-11 ENCOUNTER — Telehealth (HOSPITAL_COMMUNITY): Payer: Self-pay | Admitting: *Deleted

## 2013-08-11 DIAGNOSIS — E039 Hypothyroidism, unspecified: Secondary | ICD-10-CM | POA: Diagnosis not present

## 2013-08-11 DIAGNOSIS — K219 Gastro-esophageal reflux disease without esophagitis: Secondary | ICD-10-CM | POA: Diagnosis not present

## 2013-08-11 DIAGNOSIS — E785 Hyperlipidemia, unspecified: Secondary | ICD-10-CM | POA: Diagnosis not present

## 2013-08-11 DIAGNOSIS — C3490 Malignant neoplasm of unspecified part of unspecified bronchus or lung: Secondary | ICD-10-CM | POA: Diagnosis not present

## 2013-08-11 DIAGNOSIS — I10 Essential (primary) hypertension: Secondary | ICD-10-CM | POA: Diagnosis not present

## 2013-08-11 DIAGNOSIS — Z510 Encounter for antineoplastic radiation therapy: Secondary | ICD-10-CM | POA: Diagnosis not present

## 2013-08-11 NOTE — Telephone Encounter (Signed)
Patient reports no problems after chemo. General fatigue is her only complaint.

## 2013-08-12 ENCOUNTER — Other Ambulatory Visit: Payer: Self-pay | Admitting: Family Medicine

## 2013-08-12 DIAGNOSIS — C3490 Malignant neoplasm of unspecified part of unspecified bronchus or lung: Secondary | ICD-10-CM | POA: Diagnosis not present

## 2013-08-12 DIAGNOSIS — Z510 Encounter for antineoplastic radiation therapy: Secondary | ICD-10-CM | POA: Diagnosis not present

## 2013-08-12 DIAGNOSIS — E039 Hypothyroidism, unspecified: Secondary | ICD-10-CM | POA: Diagnosis not present

## 2013-08-12 DIAGNOSIS — I10 Essential (primary) hypertension: Secondary | ICD-10-CM | POA: Diagnosis not present

## 2013-08-12 DIAGNOSIS — E785 Hyperlipidemia, unspecified: Secondary | ICD-10-CM | POA: Diagnosis not present

## 2013-08-12 DIAGNOSIS — K219 Gastro-esophageal reflux disease without esophagitis: Secondary | ICD-10-CM | POA: Diagnosis not present

## 2013-08-13 ENCOUNTER — Ambulatory Visit (HOSPITAL_COMMUNITY): Payer: Medicare Other

## 2013-08-13 ENCOUNTER — Inpatient Hospital Stay (HOSPITAL_COMMUNITY): Payer: Medicare Other

## 2013-08-13 DIAGNOSIS — C3490 Malignant neoplasm of unspecified part of unspecified bronchus or lung: Secondary | ICD-10-CM | POA: Diagnosis not present

## 2013-08-13 DIAGNOSIS — E785 Hyperlipidemia, unspecified: Secondary | ICD-10-CM | POA: Diagnosis not present

## 2013-08-13 DIAGNOSIS — I10 Essential (primary) hypertension: Secondary | ICD-10-CM | POA: Diagnosis not present

## 2013-08-13 DIAGNOSIS — K219 Gastro-esophageal reflux disease without esophagitis: Secondary | ICD-10-CM | POA: Diagnosis not present

## 2013-08-13 DIAGNOSIS — E039 Hypothyroidism, unspecified: Secondary | ICD-10-CM | POA: Diagnosis not present

## 2013-08-13 DIAGNOSIS — Z510 Encounter for antineoplastic radiation therapy: Secondary | ICD-10-CM | POA: Diagnosis not present

## 2013-08-14 ENCOUNTER — Ambulatory Visit (HOSPITAL_COMMUNITY): Payer: Medicare Other

## 2013-08-14 ENCOUNTER — Inpatient Hospital Stay (HOSPITAL_COMMUNITY): Payer: Medicare Other

## 2013-08-14 DIAGNOSIS — C3490 Malignant neoplasm of unspecified part of unspecified bronchus or lung: Secondary | ICD-10-CM | POA: Diagnosis not present

## 2013-08-14 DIAGNOSIS — E785 Hyperlipidemia, unspecified: Secondary | ICD-10-CM | POA: Diagnosis not present

## 2013-08-14 DIAGNOSIS — E039 Hypothyroidism, unspecified: Secondary | ICD-10-CM | POA: Diagnosis not present

## 2013-08-14 DIAGNOSIS — Z510 Encounter for antineoplastic radiation therapy: Secondary | ICD-10-CM | POA: Diagnosis not present

## 2013-08-14 DIAGNOSIS — I10 Essential (primary) hypertension: Secondary | ICD-10-CM | POA: Diagnosis not present

## 2013-08-14 DIAGNOSIS — C3430 Malignant neoplasm of lower lobe, unspecified bronchus or lung: Secondary | ICD-10-CM | POA: Diagnosis not present

## 2013-08-14 DIAGNOSIS — K219 Gastro-esophageal reflux disease without esophagitis: Secondary | ICD-10-CM | POA: Diagnosis not present

## 2013-08-15 ENCOUNTER — Other Ambulatory Visit (HOSPITAL_COMMUNITY): Payer: Self-pay | Admitting: Oncology

## 2013-08-15 ENCOUNTER — Inpatient Hospital Stay (HOSPITAL_COMMUNITY): Payer: Medicare Other

## 2013-08-15 ENCOUNTER — Encounter (HOSPITAL_BASED_OUTPATIENT_CLINIC_OR_DEPARTMENT_OTHER): Payer: Medicare Other

## 2013-08-15 ENCOUNTER — Encounter (HOSPITAL_COMMUNITY): Payer: Self-pay

## 2013-08-15 ENCOUNTER — Other Ambulatory Visit: Payer: Self-pay | Admitting: Family Medicine

## 2013-08-15 DIAGNOSIS — Z510 Encounter for antineoplastic radiation therapy: Secondary | ICD-10-CM | POA: Diagnosis not present

## 2013-08-15 DIAGNOSIS — E876 Hypokalemia: Secondary | ICD-10-CM

## 2013-08-15 DIAGNOSIS — E785 Hyperlipidemia, unspecified: Secondary | ICD-10-CM | POA: Diagnosis not present

## 2013-08-15 DIAGNOSIS — R918 Other nonspecific abnormal finding of lung field: Secondary | ICD-10-CM

## 2013-08-15 DIAGNOSIS — E039 Hypothyroidism, unspecified: Secondary | ICD-10-CM | POA: Diagnosis not present

## 2013-08-15 DIAGNOSIS — D509 Iron deficiency anemia, unspecified: Secondary | ICD-10-CM

## 2013-08-15 DIAGNOSIS — D638 Anemia in other chronic diseases classified elsewhere: Secondary | ICD-10-CM | POA: Diagnosis not present

## 2013-08-15 DIAGNOSIS — C3490 Malignant neoplasm of unspecified part of unspecified bronchus or lung: Secondary | ICD-10-CM | POA: Diagnosis not present

## 2013-08-15 DIAGNOSIS — F17200 Nicotine dependence, unspecified, uncomplicated: Secondary | ICD-10-CM | POA: Diagnosis not present

## 2013-08-15 DIAGNOSIS — F411 Generalized anxiety disorder: Secondary | ICD-10-CM | POA: Diagnosis not present

## 2013-08-15 DIAGNOSIS — I10 Essential (primary) hypertension: Secondary | ICD-10-CM

## 2013-08-15 DIAGNOSIS — R05 Cough: Secondary | ICD-10-CM

## 2013-08-15 DIAGNOSIS — K219 Gastro-esophageal reflux disease without esophagitis: Secondary | ICD-10-CM | POA: Diagnosis not present

## 2013-08-15 DIAGNOSIS — K7689 Other specified diseases of liver: Secondary | ICD-10-CM | POA: Diagnosis not present

## 2013-08-15 DIAGNOSIS — C3430 Malignant neoplasm of lower lobe, unspecified bronchus or lung: Secondary | ICD-10-CM

## 2013-08-15 DIAGNOSIS — J449 Chronic obstructive pulmonary disease, unspecified: Secondary | ICD-10-CM | POA: Diagnosis not present

## 2013-08-15 DIAGNOSIS — D500 Iron deficiency anemia secondary to blood loss (chronic): Secondary | ICD-10-CM | POA: Diagnosis not present

## 2013-08-15 DIAGNOSIS — I2510 Atherosclerotic heart disease of native coronary artery without angina pectoris: Secondary | ICD-10-CM | POA: Diagnosis not present

## 2013-08-15 DIAGNOSIS — C3492 Malignant neoplasm of unspecified part of left bronchus or lung: Secondary | ICD-10-CM

## 2013-08-15 DIAGNOSIS — Z5111 Encounter for antineoplastic chemotherapy: Secondary | ICD-10-CM | POA: Diagnosis not present

## 2013-08-15 DIAGNOSIS — R058 Other specified cough: Secondary | ICD-10-CM

## 2013-08-15 DIAGNOSIS — G4700 Insomnia, unspecified: Secondary | ICD-10-CM | POA: Diagnosis not present

## 2013-08-15 DIAGNOSIS — I739 Peripheral vascular disease, unspecified: Secondary | ICD-10-CM | POA: Diagnosis not present

## 2013-08-15 DIAGNOSIS — R599 Enlarged lymph nodes, unspecified: Secondary | ICD-10-CM | POA: Diagnosis not present

## 2013-08-15 LAB — CBC WITH DIFFERENTIAL/PLATELET
BASOS PCT: 1 % (ref 0–1)
Basophils Absolute: 0 10*3/uL (ref 0.0–0.1)
Eosinophils Absolute: 0.2 10*3/uL (ref 0.0–0.7)
Eosinophils Relative: 4 % (ref 0–5)
HCT: 27.6 % — ABNORMAL LOW (ref 36.0–46.0)
HEMOGLOBIN: 9.4 g/dL — AB (ref 12.0–15.0)
LYMPHS PCT: 13 % (ref 12–46)
Lymphs Abs: 0.6 10*3/uL — ABNORMAL LOW (ref 0.7–4.0)
MCH: 33.6 pg (ref 26.0–34.0)
MCHC: 34.1 g/dL (ref 30.0–36.0)
MCV: 98.6 fL (ref 78.0–100.0)
MONOS PCT: 8 % (ref 3–12)
Monocytes Absolute: 0.3 10*3/uL (ref 0.1–1.0)
NEUTROS ABS: 3.2 10*3/uL (ref 1.7–7.7)
Neutrophils Relative %: 75 % (ref 43–77)
Platelets: 325 10*3/uL (ref 150–400)
RBC: 2.8 MIL/uL — AB (ref 3.87–5.11)
RDW: 13.7 % (ref 11.5–15.5)
WBC: 4.3 10*3/uL (ref 4.0–10.5)

## 2013-08-15 LAB — BASIC METABOLIC PANEL
BUN: 17 mg/dL (ref 6–23)
CHLORIDE: 99 meq/L (ref 96–112)
CO2: 30 meq/L (ref 19–32)
Calcium: 9.3 mg/dL (ref 8.4–10.5)
Creatinine, Ser: 1.76 mg/dL — ABNORMAL HIGH (ref 0.50–1.10)
GFR calc Af Amer: 31 mL/min — ABNORMAL LOW (ref >90)
GFR calc non Af Amer: 27 mL/min — ABNORMAL LOW (ref >90)
Glucose, Bld: 87 mg/dL (ref 70–99)
Potassium: 3.3 mEq/L — ABNORMAL LOW (ref 3.7–5.3)
Sodium: 139 mEq/L (ref 137–147)

## 2013-08-15 MED ORDER — SODIUM CHLORIDE 0.9 % IV SOLN
Freq: Once | INTRAVENOUS | Status: AC
  Administered 2013-08-15: 500 mL via INTRAVENOUS

## 2013-08-15 MED ORDER — SODIUM CHLORIDE 0.9 % IJ SOLN
10.0000 mL | INTRAMUSCULAR | Status: DC | PRN
  Administered 2013-08-15: 10 mL

## 2013-08-15 MED ORDER — POTASSIUM CHLORIDE CRYS ER 20 MEQ PO TBCR: 20.0000 meq | EXTENDED_RELEASE_TABLET | Freq: Two times a day (BID) | ORAL | Status: DC

## 2013-08-15 MED ORDER — DIPHENHYDRAMINE HCL 50 MG/ML IJ SOLN
50.0000 mg | Freq: Once | INTRAMUSCULAR | Status: AC
  Administered 2013-08-15: 50 mg via INTRAVENOUS
  Filled 2013-08-15: qty 1

## 2013-08-15 MED ORDER — BENZONATATE 200 MG PO CAPS: 200.0000 mg | ORAL_CAPSULE | Freq: Three times a day (TID) | ORAL | Status: DC | PRN

## 2013-08-15 MED ORDER — HEPARIN SOD (PORK) LOCK FLUSH 100 UNIT/ML IV SOLN
500.0000 [IU] | Freq: Once | INTRAVENOUS | Status: AC | PRN
Start: 2013-08-15 — End: 2013-08-15
  Administered 2013-08-15: 500 [IU]
  Filled 2013-08-15: qty 5

## 2013-08-15 MED ORDER — PACLITAXEL CHEMO INJECTION 300 MG/50ML
45.0000 mg/m2 | Freq: Once | INTRAVENOUS | Status: AC
  Administered 2013-08-15: 84 mg via INTRAVENOUS
  Filled 2013-08-15: qty 14

## 2013-08-15 MED ORDER — FAMOTIDINE IN NACL 20-0.9 MG/50ML-% IV SOLN
20.0000 mg | Freq: Once | INTRAVENOUS | Status: AC
  Administered 2013-08-15: 20 mg via INTRAVENOUS
  Filled 2013-08-15: qty 50

## 2013-08-15 MED ORDER — SODIUM CHLORIDE 0.9 % IV SOLN
Freq: Once | INTRAVENOUS | Status: AC
  Administered 2013-08-15: 8 mg via INTRAVENOUS
  Filled 2013-08-15: qty 8

## 2013-08-15 MED ORDER — SODIUM CHLORIDE 0.9 % IV SOLN
110.2000 mg | Freq: Once | INTRAVENOUS | Status: AC
  Administered 2013-08-15: 110 mg via INTRAVENOUS
  Filled 2013-08-15: qty 11

## 2013-08-15 MED ORDER — DEXAMETHASONE SODIUM PHOSPHATE 10 MG/ML IJ SOLN: 20.0000 mg | Freq: Once | INTRAMUSCULAR | Status: DC

## 2013-08-15 MED ORDER — BENZONATATE 100 MG PO CAPS
200.0000 mg | ORAL_CAPSULE | Freq: Once | ORAL | Status: AC
  Administered 2013-08-15: 200 mg via ORAL
  Filled 2013-08-15: qty 2

## 2013-08-15 MED ORDER — SODIUM CHLORIDE 0.9 % IV SOLN: 16.0000 mg | Freq: Once | INTRAVENOUS | Status: DC

## 2013-08-15 NOTE — Patient Instructions (Signed)
[facility] Discharge Instructions  RECOMMENDATIONS MADE BY THE CONSULTANT AND ANY TEST RESULTS WILL BE SENT TO YOUR REFERRING PHYSICIAN.  EXAM FINDINGS BY THE PHYSICIAN TODAY AND SIGNS OR SYMPTOMS TO REPORT TO CLINIC OR PRIMARY PHYSICIAN: Exam and findings as discussed by Dr. Barnet Glasgow.  Will continue therapy. Report fevers, uncontrolled nausea, vomiting or other problems.  MEDICATIONS PRESCRIBED:  Potassium 20 meq - take 1 twice daily.  INSTRUCTIONS/FOLLOW-UP: Follow-up in 1 week with chemotherapy and MD visit.  Thank you for choosing [facility] to provide your oncology and hematology care.  To afford each patient quality time with our providers, please arrive at least 15 minutes before your scheduled appointment time.  With your help, our goal is to use those 15 minutes to complete the necessary work-up to ensure our physicians have the information they need to help with your evaluation and healthcare recommendations.    Effective January 1st, 2014, we ask that you re-schedule your appointment with our physicians should you arrive 10 or more minutes late for your appointment.  We strive to give you quality time with our providers, and arriving late affects you and other patients whose appointments are after yours.    Again, thank you for choosing [facility].  Our hope is that these requests will decrease the amount of time that you wait before being seen by our physicians.       _____________________________________________________________  Should you have questions after your visit to [facility], please contact our office at [phone] between the hours of 8:30 a.m. and 5:00 p.m.  Voicemails left after 4:30 p.m. will not be returned until the following business day.  For prescription refill requests, have your pharmacy contact our office with your prescription refill request.

## 2013-08-15 NOTE — Progress Notes (Signed)
[name] tolerated infusions well and without incident; verbalizes understanding for follow-up.  No distress noted at time of discharge and patient was discharged home with her daughter and nephew.

## 2013-08-15 NOTE — Progress Notes (Signed)
[facility]  OFFICE PROGRESS NOTE  [name], MD [address] [place] [address]  DIAGNOSIS: Squamous cell carcinoma of left lung  Anemia, iron deficiency  Chief Complaint  Patient presents with  . Stage III squamous cell carcinoma lung    Combined modality therapy ongoing    CURRENT THERAPY: Weekly carboplatin/Taxol plus daily radiotherapy with treatment started on 08/08/2013.  INTERVAL HISTORY: [name] 79 y.o. 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 Aside from being fatigue from going to radiation on a daily basis, the patient has been doing well with no dysphagia, fever, night sweats, nausea, vomiting, diarrhea, peripheral paresthesias, or any further hemoptysis. Bowel movement are regular with no incontinence, headache, chest pain, lower extremity swelling or redness, PND, orthopnea, or palpitations.   MEDICAL HISTORY: Past Medical History  Diagnosis Date  . Hypertension   . Hypothyroidism   . Hyperlipidemia   . Insomnia   . GERD (gastroesophageal reflux disease)     chronic gastritis  . Adrenal adenoma   . Chronic diarrhea   . Fatty liver   . Arthritis     knee  . QT prolongation     syncope  . Coronary artery disease   . Impaired fasting glucose   . Renal insufficiency     low protien diet  . Tobacco use     1/2 ppd, approx 25-50 pack years (as of 08/2012)  . Family history of heart disease   . IBS (irritable bowel syndrome)   . Peripheral arterial disease     sstatus post  infrarenal abdominal aortic tube graft placed by Dr. Victorino Dike February 2008  . Cancer     Lung  . Squamous cell carcinoma of left lung 07/29/2013    INTERIM HISTORY: has Unspecified hypothyroidism; Pruritic dermatitis; HTN (hypertension); Chronic renal insufficiency; Other and unspecified hyperlipidemia;  Generalized anxiety disorder; Insomnia; Esophageal reflux; Coronary artery disease; Essential hypertension; Hyperlipidemia; Peripheral arterial disease; Anemia, iron deficiency; Hemoptysis; and Squamous cell carcinoma of left lung on her problem list.   Stage IIIB squamous cell carcinoma with a 4.1 cm left lower lobe lesion, left hilar and bilateral mediastinal lymphadenopathy (questionable hepatic lesion and left upper lobe of lung lesion with treatment started on 08/07/2013. Squamous cell carcinoma of left lung  06/30/2013  Imaging  CT of chest demonstrates 4 cm left lower lobe lesion with other concerning findings for malignancy  07/09/2013  Imaging  PET- 4.1 cm left lower oobe lesion with left hilar and bilateral mediastinal lymphadeopathy. Hepatic lesion not confidently identified. 1.8 cm nodule in left upper lobe, cannot exclude low grade tumor.  07/16/2013  Initial Diagnosis  Squamous cell carcinoma of left lung via CT-guided biopsy by IR  07/29/2013  Surgery  IR placement of port-a-cath in preparation for concomittant chemoradiation  08/08/2013 -  Chemotherapy  Paclitaxel/Carboplatin weekly x 6 along with daily radiotherapy 5 days per week.  ALLERGIES:  is allergic to aleve; dilaudid; dyazide; and zithromax.  MEDICATIONS: has a current medication list which includes the following prescription(s): allopurinol, alprazolam, amlodipine, amlodipine, aspirin ec, cholecalciferol, enalapril, esomeprazole, fenofibrate, ferrous sulfate, furosemide, guaifenesin, hydralazine, hydroxyzine, levothyroxine, lidocaine-prilocaine, loperamide, magnesium oxide, metoclopramide, ondansetron, paroxetine, paroxetine, pravastatin, prochlorperazine, tramadol, and zolpidem, and the following Facility-Administered Medications: heparin lock flush and sodium chloride.  SURGICAL HISTORY:  Past Surgical History  Procedure Laterality Date  . Abdominal  hysterectomy    . Thyroidectomy, partial    . Colonoscopy  5/04     normal  . Abdominal aortic aneurysm repair  07/2006    D. J.D. Kellie Simmering  . Cataract extraction Bilateral 2010  . Cardiac catheterization  02/2010    mod CAD in L-dominant system with normal LV function  . Colon resection  2005    1/2 colon removed  . Transthoracic echocardiogram  02/2010    EF 55-60%; mild conc LVH, normal systolic function; mildly calcified AV annulus  . Colonoscopy with esophagogastroduodenoscopy (egd) N/A 03/19/2013    Procedure: COLONOSCOPY WITH ESOPHAGOGASTRODUODENOSCOPY (EGD);  Surgeon: Rogene Houston, MD;  Location: [facility];  Service: Endoscopy;  Laterality: N/A;  145  . Dg biopsy lung Left Jan 2015    FAMILY HISTORY: family history includes Diabetes in her mother; Heart attack in her mother; Hyperlipidemia in her sister; Hypertension in her mother; Kidney disease in her brother.  SOCIAL HISTORY:  reports that she has been smoking Cigarettes.  She has a 29.5 pack-year smoking history. She has never used smokeless tobacco. She reports that she does not drink alcohol or use illicit drugs.  REVIEW OF SYSTEMS:  Other than that discussed above is noncontributory.  PHYSICAL EXAMINATION: ECOG PERFORMANCE STATUS: 1 - Symptomatic but completely ambulatory  There were no vitals taken for this visit.  GENERAL:alert, no distress and comfortable SKIN: skin color, texture, turgor are normal, no rashes or significant lesions EYES: PERLA; Conjunctiva are pink and non-injected, sclera clear OROPHARYNX:no exudate, no erythema on lips, buccal mucosa, or tongue. NECK: supple, thyroid normal size, non-tender, without nodularity. No masses CHEST: Increased AP diameter with no breast masses. LifePort in place and functioning well. LYMPH:  no palpable lymphadenopathy in the cervical, axillary or inguinal LUNGS: clear to auscultation and percussion with normal breathing effort HEART: regular rate & rhythm and no murmurs. ABDOMEN:abdomen soft, non-tender and normal bowel  sounds MUSCULOSKELETAL:no cyanosis of digits and no clubbing. Range of motion normal.  NEURO: alert & oriented x 3 with fluent speech, no focal motor/sensory deficits   LABORATORY DATA: Infusion on 08/15/2013  Component Date Value Ref Range Status  . WBC 08/15/2013 4.3  4.0 - 10.5 K/uL Final  . RBC 08/15/2013 2.80* 3.87 - 5.11 MIL/uL Final  . Hemoglobin 08/15/2013 9.4* 12.0 - 15.0 g/dL Final  . HCT 08/15/2013 27.6* 36.0 - 46.0 % Final  . MCV 08/15/2013 98.6  78.0 - 100.0 fL Final  . MCH 08/15/2013 33.6  26.0 - 34.0 pg Final  . MCHC 08/15/2013 34.1  30.0 - 36.0 g/dL Final  . RDW 08/15/2013 13.7  11.5 - 15.5 % Final  . Platelets 08/15/2013 325  150 - 400 K/uL Final  . Neutrophils Relative % 08/15/2013 75  43 - 77 % Final  . Neutro Abs 08/15/2013 3.2  1.7 - 7.7 K/uL Final  . Lymphocytes Relative 08/15/2013 13  12 - 46 % Final  . Lymphs Abs 08/15/2013 0.6* 0.7 - 4.0 K/uL Final  . Monocytes Relative 08/15/2013 8  3 - 12 % Final  . Monocytes Absolute 08/15/2013 0.3  0.1 - 1.0 K/uL Final  . Eosinophils Relative 08/15/2013 4  0 - 5 % Final  . Eosinophils Absolute 08/15/2013 0.2  0.0 - 0.7 K/uL Final  . Basophils Relative 08/15/2013 1  0 - 1 % Final  . Basophils Absolute 08/15/2013 0.0  0.0 - 0.1 K/uL Final  Infusion on 08/08/2013  Component Date Value Ref Range Status  . Sodium  08/08/2013 140  137 - 147 mEq/L Final  . Potassium 08/08/2013 3.2* 3.7 - 5.3 mEq/L Final  . Chloride 08/08/2013 101  96 - 112 mEq/L Final  . CO2 08/08/2013 28  19 - 32 mEq/L Final  . Glucose, Bld 08/08/2013 93  70 - 99 mg/dL Final  . BUN 08/08/2013 14  6 - 23 mg/dL Final  . Creatinine, Ser 08/08/2013 1.88* 0.50 - 1.10 mg/dL Final  . Calcium 08/08/2013 9.3  8.4 - 10.5 mg/dL Final  . Total Protein 08/08/2013 7.1  6.0 - 8.3 g/dL Final  . Albumin 08/08/2013 3.6  3.5 - 5.2 g/dL Final  . AST 08/08/2013 19  0 - 37 U/L Final  . ALT 08/08/2013 8  0 - 35 U/L Final  . Alkaline Phosphatase 08/08/2013 56  39 - 117 U/L  Final  . Total Bilirubin 08/08/2013 0.4  0.3 - 1.2 mg/dL Final  . GFR calc non Af Amer 08/08/2013 25* >90 mL/min Final  . GFR calc Af Amer 08/08/2013 29* >90 mL/min Final   Comment: (NOTE)                          The eGFR has been calculated using the CKD EPI equation.                          This calculation has not been validated in all clinical situations.                          eGFR's persistently <90 mL/min signify possible Chronic Kidney                          Disease.  . WBC 08/08/2013 4.9  4.0 - 10.5 K/uL Final  . RBC 08/08/2013 3.18* 3.87 - 5.11 MIL/uL Final  . Hemoglobin 08/08/2013 10.6* 12.0 - 15.0 g/dL Final  . HCT 08/08/2013 31.7* 36.0 - 46.0 % Final  . MCV 08/08/2013 99.7  78.0 - 100.0 fL Final  . MCH 08/08/2013 33.3  26.0 - 34.0 pg Final  . MCHC 08/08/2013 33.4  30.0 - 36.0 g/dL Final  . RDW 08/08/2013 14.0  11.5 - 15.5 % Final  . Platelets 08/08/2013 253  150 - 400 K/uL Final  . Neutrophils Relative % 08/08/2013 65  43 - 77 % Final  . Neutro Abs 08/08/2013 3.2  1.7 - 7.7 K/uL Final  . Lymphocytes Relative 08/08/2013 19  12 - 46 % Final  . Lymphs Abs 08/08/2013 0.9  0.7 - 4.0 K/uL Final  . Monocytes Relative 08/08/2013 11  3 - 12 % Final  . Monocytes Absolute 08/08/2013 0.6  0.1 - 1.0 K/uL Final  . Eosinophils Relative 08/08/2013 4  0 - 5 % Final  . Eosinophils Absolute 08/08/2013 0.2  0.0 - 0.7 K/uL Final  . Basophils Relative 08/08/2013 1  0 - 1 % Final  . Basophils Absolute 08/08/2013 0.0  0.0 - 0.1 K/uL Final  Hospital Outpatient Visit on 07/29/2013  Component Date Value Ref Range Status  . aPTT 07/29/2013 41* 24 - 37 seconds Final   Comment:                                 IF BASELINE aPTT IS ELEVATED,  SUGGEST PATIENT RISK ASSESSMENT                          BE USED TO DETERMINE APPROPRIATE                          ANTICOAGULANT THERAPY.  . WBC 07/29/2013 5.8  4.0 - 10.5 K/uL Final  . RBC 07/29/2013 3.39* 3.87 - 5.11 MIL/uL Final   . Hemoglobin 07/29/2013 10.9* 12.0 - 15.0 g/dL Final  . HCT 07/29/2013 33.2* 36.0 - 46.0 % Final  . MCV 07/29/2013 97.9  78.0 - 100.0 fL Final  . MCH 07/29/2013 32.2  26.0 - 34.0 pg Final  . MCHC 07/29/2013 32.8  30.0 - 36.0 g/dL Final  . RDW 07/29/2013 14.0  11.5 - 15.5 % Final  . Platelets 07/29/2013 291  150 - 400 K/uL Final  . Prothrombin Time 07/29/2013 13.2  11.6 - 15.2 seconds Final  . INR 07/29/2013 1.02  0.00 - 1.49 Final    PATHOLOGY: Squamous cell carcinoma. Request made for [facility] analysis to demonstrate whether or not Hedgehog-GLI  is  [place] which would predict for responsivity to Vismodegib(Erivedge).  Urinalysis    Component Value Date/Time   COLORURINE YELLOW 03/20/2010 1405   APPEARANCEUR CLEAR 03/20/2010 1405   LABSPEC 1.015 03/20/2010 1405   PHURINE 6.0 03/20/2010 1405   GLUCOSEU NEGATIVE 03/20/2010 1405   HGBUR SMALL* 03/20/2010 [place] 03/20/2010 [place] 03/20/2010 [place] 03/20/2010 1405   UROBILINOGEN 0.2 03/20/2010 1405   NITRITE NEGATIVE 03/20/2010 1405   LEUKOCYTESUR NEGATIVE 03/20/2010 1405    RADIOGRAPHIC STUDIES: Dg Chest 1 View  07/16/2013   CLINICAL DATA:  Status post lung biopsy  EXAM: CHEST - 1 VIEW  COMPARISON:  March 20, 2010 chest radiograph; chest CT June 25, 2013  FINDINGS: There is no appreciable pneumothorax. There is consolidation with mass in the left base. Lungs are otherwise clear. Heart is enlarged with normal pulmonary vascularity. No adenopathy. There is atherosclerotic change in the aorta.  IMPRESSION: No apparent pneumothorax.  Mass with consolidation left base.   Electronically Signed   By: Lowella Grip M.D.   On: 07/16/2013 12:43   Ir Fluoro Guide Cv Line Right  07/29/2013   CLINICAL DATA:  79 year old with lung cancer. Port-A-Cath needed for chemotherapy.  EXAM: FLUOROSCOPIC AND ULTRASOUND GUIDED PLACEMENT OF A SUBCUTANEOUS PORT.  Physician: Stephan Minister. Anselm Pancoast, MD   MEDICATIONS AND MEDICAL HISTORY: 4 mg versed, 100 mcg fentanyl. Ancef 2 gm. A radiology nurse monitored the patient for moderate sedation. As antibiotic prophylaxis, Ancef was ordered pre-procedure and administered intravenously within one hour of incision.  ANESTHESIA/SEDATION: Moderate sedation time: 45 minutes  FLUOROSCOPY TIME:  2 min and 36 seconds  PROCEDURE: The risks of the procedure were explained to the patient. Informed consent was obtained. Patient was placed supine on the interventional table. Ultrasound confirmed a patent right internal jugular vein. The right chest and neck were cleaned with a skin antiseptic and a sterile drape was placed. Maximal barrier sterile technique was utilized including caps, mask, sterile gowns, sterile gloves, sterile drape, hand hygiene and skin antiseptic. The right neck was anesthetized with 1% lidocaine. Small incision was made in the right neck with a blade. Micropuncture set was placed in the right internal jugular vein with ultrasound guidance. The micropuncture wire was used for measurement purposes. The right chest was  anesthetized with 1% lidocaine with epinephrine. #15 blade was used to make an incision and a subcutaneous port pocket was formed. [place] was assembled. Subcutaneous tunnel was formed with a stiff tunneling device. The port catheter was brought through the subcutaneous tunnel. The port was placed in the subcutaneous pocket and sutured in place. The micropuncture set was exchanged for a peel-away sheath. The catheter was placed through the peel-away sheath and the tip was positioned in the lower SVC. Catheter placement was confirmed with fluoroscopy. The port was accessed and flushed with heparinized saline. The port pocket was closed using two layers of absorbable sutures and Dermabond. The vein skin site was closed using a single layer of absorbable suture and Dermabond. Sterile dressings were applied. Patient tolerated the procedure  well without an immediate complication. Ultrasound and fluoroscopic images were taken and saved for this procedure.  COMPLICATIONS: None  IMPRESSION: Placement of a subcutaneous port device. The catheter tip is in the lower SVC and ready to be used.   Electronically Signed   By: Markus Daft M.D.   On: 07/29/2013 16:44   Ir US Guide Vasc Access Right  07/29/2013   CLINICAL DATA:  79 year old with lung cancer. Port-A-Cath needed for chemotherapy.  EXAM: FLUOROSCOPIC AND ULTRASOUND GUIDED PLACEMENT OF A SUBCUTANEOUS PORT.  Physician: Stephan Minister. Anselm Pancoast, MD  MEDICATIONS AND MEDICAL HISTORY: 4 mg versed, 100 mcg fentanyl. Ancef 2 gm. A radiology nurse monitored the patient for moderate sedation. As antibiotic prophylaxis, Ancef was ordered pre-procedure and administered intravenously within one hour of incision.  ANESTHESIA/SEDATION: Moderate sedation time: 45 minutes  FLUOROSCOPY TIME:  2 min and 36 seconds  PROCEDURE: The risks of the procedure were explained to the patient. Informed consent was obtained. Patient was placed supine on the interventional table. Ultrasound confirmed a patent right internal jugular vein. The right chest and neck were cleaned with a skin antiseptic and a sterile drape was placed. Maximal barrier sterile technique was utilized including caps, mask, sterile gowns, sterile gloves, sterile drape, hand hygiene and skin antiseptic. The right neck was anesthetized with 1% lidocaine. Small incision was made in the right neck with a blade. Micropuncture set was placed in the right internal jugular vein with ultrasound guidance. The micropuncture wire was used for measurement purposes. The right chest was anesthetized with 1% lidocaine with epinephrine. #15 blade was used to make an incision and a subcutaneous port pocket was formed. [place] was assembled. Subcutaneous tunnel was formed with a stiff tunneling device. The port catheter was brought through the subcutaneous tunnel. The port was  placed in the subcutaneous pocket and sutured in place. The micropuncture set was exchanged for a peel-away sheath. The catheter was placed through the peel-away sheath and the tip was positioned in the lower SVC. Catheter placement was confirmed with fluoroscopy. The port was accessed and flushed with heparinized saline. The port pocket was closed using two layers of absorbable sutures and Dermabond. The vein skin site was closed using a single layer of absorbable suture and Dermabond. Sterile dressings were applied. Patient tolerated the procedure well without an immediate complication. Ultrasound and fluoroscopic images were taken and saved for this procedure.  COMPLICATIONS: None  IMPRESSION: Placement of a subcutaneous port device. The catheter tip is in the lower SVC and ready to be used.   Electronically Signed   By: Markus Daft M.D.   On: 07/29/2013 16:44    ASSESSMENT:  #1. Stage IIIB squamous cell carcinoma  of the left lower lobe, 4.1 cm, with involvement of left hilar and bilateral mediastinal lymph nodes with questionable hepatic lesion and also left upper lobe lesion, for additional carboplatin and Taxol today represents cycle #2 in conjunction with daily radiotherapy 5 days per week. #2. Chronic obstructive pulmonary disease #3. Gastroesophageal reflux disease, on treatment. #4. Hypertension, controlled. #5. Hypothyroidism, on treatment. #6. Anemia of chronic disease and from chronic blood loss, improving.   PLAN:  #1. Intravenous carboplatin and Taxol today. #2. Daily radiotherapy 5 days per week. #3. Followup in one week for cycle #3 of chemotherapy.   All questions were answered. The patient knows to call the clinic with any problems, questions or concerns. We can certainly see the patient much sooner if necessary.   I spent 25 minutes counseling the patient face to face. The total time spent in the appointment was 30 minutes.    Doroteo Bradford, MD 08/15/2013 11:11  AM

## 2013-08-15 NOTE — Progress Notes (Signed)
1400 patient c/o cough. Noted to be coughing at this time, productive of white phlegm. Orders received for tessalon perls.

## 2013-08-18 ENCOUNTER — Other Ambulatory Visit (HOSPITAL_COMMUNITY)
Admission: RE | Admit: 2013-08-18 | Discharge: 2013-08-18 | Disposition: A | Discharge status: Home / Self Care | Payer: Medicare Other | Source: Ambulatory Visit | Attending: Hematology and Oncology | Admitting: Hematology and Oncology

## 2013-08-18 ENCOUNTER — Encounter: Payer: Medicare Other | Admitting: Nurse Practitioner

## 2013-08-18 DIAGNOSIS — C3490 Malignant neoplasm of unspecified part of unspecified bronchus or lung: Secondary | ICD-10-CM | POA: Insufficient documentation

## 2013-08-18 DIAGNOSIS — Z510 Encounter for antineoplastic radiation therapy: Secondary | ICD-10-CM | POA: Diagnosis not present

## 2013-08-18 DIAGNOSIS — E785 Hyperlipidemia, unspecified: Secondary | ICD-10-CM | POA: Diagnosis not present

## 2013-08-18 DIAGNOSIS — K219 Gastro-esophageal reflux disease without esophagitis: Secondary | ICD-10-CM | POA: Diagnosis not present

## 2013-08-18 DIAGNOSIS — E039 Hypothyroidism, unspecified: Secondary | ICD-10-CM | POA: Diagnosis not present

## 2013-08-18 DIAGNOSIS — I10 Essential (primary) hypertension: Secondary | ICD-10-CM | POA: Diagnosis not present

## 2013-08-19 DIAGNOSIS — E785 Hyperlipidemia, unspecified: Secondary | ICD-10-CM | POA: Diagnosis not present

## 2013-08-19 DIAGNOSIS — I10 Essential (primary) hypertension: Secondary | ICD-10-CM | POA: Diagnosis not present

## 2013-08-19 DIAGNOSIS — E039 Hypothyroidism, unspecified: Secondary | ICD-10-CM | POA: Diagnosis not present

## 2013-08-19 DIAGNOSIS — C3490 Malignant neoplasm of unspecified part of unspecified bronchus or lung: Secondary | ICD-10-CM | POA: Diagnosis not present

## 2013-08-19 DIAGNOSIS — K219 Gastro-esophageal reflux disease without esophagitis: Secondary | ICD-10-CM | POA: Diagnosis not present

## 2013-08-19 DIAGNOSIS — Z510 Encounter for antineoplastic radiation therapy: Secondary | ICD-10-CM | POA: Diagnosis not present

## 2013-08-20 ENCOUNTER — Ambulatory Visit (HOSPITAL_COMMUNITY): Payer: Medicare Other | Admitting: Oncology

## 2013-08-20 ENCOUNTER — Inpatient Hospital Stay (HOSPITAL_COMMUNITY): Payer: Medicare Other

## 2013-08-20 DIAGNOSIS — E039 Hypothyroidism, unspecified: Secondary | ICD-10-CM | POA: Diagnosis not present

## 2013-08-20 DIAGNOSIS — K219 Gastro-esophageal reflux disease without esophagitis: Secondary | ICD-10-CM | POA: Diagnosis not present

## 2013-08-20 DIAGNOSIS — E785 Hyperlipidemia, unspecified: Secondary | ICD-10-CM | POA: Diagnosis not present

## 2013-08-20 DIAGNOSIS — C3490 Malignant neoplasm of unspecified part of unspecified bronchus or lung: Secondary | ICD-10-CM | POA: Diagnosis not present

## 2013-08-20 DIAGNOSIS — Z510 Encounter for antineoplastic radiation therapy: Secondary | ICD-10-CM | POA: Diagnosis not present

## 2013-08-20 DIAGNOSIS — I10 Essential (primary) hypertension: Secondary | ICD-10-CM | POA: Diagnosis not present

## 2013-08-21 ENCOUNTER — Inpatient Hospital Stay (HOSPITAL_COMMUNITY): Payer: Medicare Other

## 2013-08-21 NOTE — Progress Notes (Signed)
[name], MD [place] [address]  Squamous cell carcinoma of left lung  CURRENT THERAPY: Chemoradiation with weekly Carboplatin/Paclitaxel beginning on 08/07/2013  INTERVAL HISTORY: [name] 79 y.o. female returns for  regular  visit for followup of Stage IIIB squamous cell carcinoma with a 4.1 cm left lower lobe lesion, left hilar and bilateral mediastinal lymphadenopathy (questionable hepatic lesion and left upper lobe of lung lesion). Undergoing chemoradiation with weekly Carboplatin/Paclitaxel beginning on 08/07/2013.    Squamous cell carcinoma of left lung   06/30/2013 Imaging CT of chest demonstrates 4 cm left lower lobe lesion with other concerning findings for malignancy   07/09/2013 Imaging PET- 4.1 cm left lower oobe lesion with left hilar and bilateral mediastinal lymphadeopathy.  Hepatic lesion not confidently identified.  1.8 cm nodule in left upper lobe, cannot exclude low grade tumor.   07/16/2013 Initial Diagnosis Squamous cell carcinoma of left lung via CT-guided biopsy by IR   07/29/2013 Surgery IR placement of port-a-cath in preparation for concomittant chemoradiation    08/08/2013 -  Chemotherapy Paclitaxel/Carboplatin weekly x 6   I personally reviewed and went over laboratory results with the patient.  The results are noted within this dictation.  The patient reports a two-month headache of right frontal area of cranium. She does admit to sinus issues associated with allergies. She sleeps on her left side in bed. With this continued headache, an MRI of the brain is warranted to evaluate for any metastatic disease. This order is placed and he can be scheduled next week.  Additionally, she notes some lower leg and ankle edema. On physical exam is 1+. It is nontender. Provide her education regarding lower extremity edema. I recommend she elevated her legs when resting.  She questions whether she can drive. From an oncology standpoint, there  is no absolute contraindication her driving. I recommend she be very careful and arose particular this time of year given the coldness and recent snowfall. I've also recommended that if she is higher, she should avoid driving. It sounds like her family has prevented her from driving due to her diagnosis and treatment. However, I do not see a reason why she cannot drive as long as it is safe for her to do so. There is no need for me to contact the [facility] of Transportation and remote her driving license. She denies any seizure disorder.  Oncologically, the patient denies any complaints and ROS questioning is negative. She is tolerating therapy well with some fatigue and tiredness which is to be expected. She does note some loose stools but no more than usual and she seems to be at baseline regarding this.  Past Medical History  Diagnosis Date  . Hypertension   . Hypothyroidism   . Hyperlipidemia   . Insomnia   . GERD (gastroesophageal reflux disease)     chronic gastritis  . Adrenal adenoma   . Chronic diarrhea   . Fatty liver   . Arthritis     knee  . QT prolongation     syncope  . Coronary artery disease   . Impaired fasting glucose   . Renal insufficiency     low protien diet  . Tobacco use     1/2 ppd, approx 25-50 pack years (as of 08/2012)  . Family history of heart disease   . IBS (irritable bowel syndrome)   . Peripheral arterial disease     sstatus post  infrarenal abdominal aortic tube  graft placed by Dr. Victorino Dike February 2008  . Cancer     Lung  . Squamous cell carcinoma of left lung 07/29/2013    has Unspecified hypothyroidism; Pruritic dermatitis; HTN (hypertension); Chronic renal insufficiency; Other and unspecified hyperlipidemia; Generalized anxiety disorder; Insomnia; Esophageal reflux; Coronary artery disease; Essential hypertension; Hyperlipidemia; Peripheral arterial disease; Anemia, iron deficiency; Hemoptysis; and Squamous cell carcinoma of left  lung on her problem list.     is allergic to aleve; dilaudid; dyazide; and zithromax.  Ms. Helbling does not currently have medications on file.  Past Surgical History  Procedure Laterality Date  . Abdominal hysterectomy    . Thyroidectomy, partial    . Colonoscopy  5/04    normal  . Abdominal aortic aneurysm repair  07/2006    D. J.D. Kellie Simmering  . Cataract extraction Bilateral 2010  . Cardiac catheterization  02/2010    mod CAD in L-dominant system with normal LV function  . Colon resection  2005    1/2 colon removed  . Transthoracic echocardiogram  02/2010    EF 55-60%; mild conc LVH, normal systolic function; mildly calcified AV annulus  . Colonoscopy with esophagogastroduodenoscopy (egd) N/A 03/19/2013    Procedure: COLONOSCOPY WITH ESOPHAGOGASTRODUODENOSCOPY (EGD);  Surgeon: Rogene Houston, MD;  Location: [facility];  Service: Endoscopy;  Laterality: N/A;  145  . Dg biopsy lung Left Jan 2015    Denies any headaches, dizziness, double vision, fevers, chills, night sweats, nausea, vomiting, diarrhea, constipation, chest pain, heart palpitations, shortness of breath, blood in stool, black tarry stool, urinary pain, urinary burning, urinary frequency, hematuria.   PHYSICAL EXAMINATION  ECOG PERFORMANCE STATUS: 2 - Symptomatic, <50% confined to bed  There were no vitals filed for this visit.  GENERAL:alert, no distress, well nourished, well developed, comfortable, cooperative, smiling and eating lunch in chemo-bed SKIN: skin color, texture, turgor are normal, no rashes or significant lesions HEAD: Normocephalic, No masses, lesions, tenderness or abnormalities EYES: normal, PERRLA, EOMI, Conjunctiva are pink and non-injected EARS: External ears normal OROPHARYNX:mucous membranes are moist  NECK: supple, no adenopathy, trachea midline LYMPH:  no palpable lymphadenopathy BREAST:not examined LUNGS: clear to auscultation  HEART: regular rate & rhythm, no murmurs and no  gallops ABDOMEN:abdomen soft and normal bowel sounds BACK: Back symmetric, no curvature. EXTREMITIES:less then 2 second capillary refill, no joint deformities, effusion, or inflammation, no skin discoloration, no cyanosis, positive findings:  edema trace to 1+ pitting edema of ankles and feet bilaterally.  NEURO: alert & oriented x 3 with fluent speech, no focal motor/sensory deficits    LABORATORY DATA: CBC    Component Value Date/Time   WBC 4.3 08/15/2013 1000   RBC 2.80* 08/15/2013 1000   HGB 9.4* 08/15/2013 1000   HCT 27.6* 08/15/2013 1000   PLT 325 08/15/2013 1000   MCV 98.6 08/15/2013 1000   MCH 33.6 08/15/2013 1000   MCHC 34.1 08/15/2013 1000   RDW 13.7 08/15/2013 1000   LYMPHSABS 0.6* 08/15/2013 1000   MONOABS 0.3 08/15/2013 1000   EOSABS 0.2 08/15/2013 1000   BASOSABS 0.0 08/15/2013 1000      Chemistry      Component Value Date/Time   NA 139 08/15/2013 1000   K 3.3* 08/15/2013 1000   CL 99 08/15/2013 1000   CO2 30 08/15/2013 1000   BUN 17 08/15/2013 1000   CREATININE 1.76* 08/15/2013 1000   CREATININE 2.03* 05/13/2013 0941      Component Value Date/Time   CALCIUM 9.3 08/15/2013 1000  ALKPHOS 56 08/08/2013 1015   AST 19 08/08/2013 1015   ALT 8 08/08/2013 1015   BILITOT 0.4 08/08/2013 1015       ASSESSMENT:  1. Stage IIIB squamous cell carcinoma with a 4.1 cm left lower lobe lesion, left hilar and bilateral mediastinal lymphadenopathy (questionable hepatic lesion and left upper lobe of lung lesion). Undergoing chemoradiation with weekly Carboplatin/Paclitaxel beginning on 08/07/2013. 2. Chronic obstructive pulmonary disease  3. Gastroesophageal reflux disease, on treatment.  4. Hypertension, controlled.  5. Hypothyroidism, on treatment.  6. Anemia of chronic disease and chronic blood loss. 7. Right frontal headache x 2 months  Patient Active Problem List   Diagnosis Date Noted  . Squamous cell carcinoma of left lung 07/29/2013  . Hemoptysis 06/20/2013  . Anemia, iron  deficiency 03/03/2013  . Coronary artery disease 02/28/2013  . Essential hypertension 02/28/2013  . Hyperlipidemia 02/28/2013  . Peripheral arterial disease 02/28/2013  . Other and unspecified hyperlipidemia 12/10/2012  . Generalized anxiety disorder 12/10/2012  . Insomnia 12/10/2012  . Esophageal reflux 12/10/2012  . Chronic renal insufficiency 11/07/2012  . Unspecified hypothyroidism 09/10/2012  . Pruritic dermatitis 09/10/2012  . HTN (hypertension) 09/10/2012     PLAN:  1. I personally reviewed and went over laboratory results with the patient.  The results are noted within this dictation. 2. Pre-chemo labs today: CBC diff, CMET 3. Concomitant chemoradiation with weekly Carboplatin/Paclitaxel as scheduled/planned. 4. MRI brain with and without contrast next week.  5. Return as scheduled next week for follow-up   THERAPY PLAN:  We will continue with chemoradiation as planned per NCCN guidelines treatment protocol.  We will manage and treat potential upcoming toxicities related to treatment.    All questions were answered. The patient knows to call the clinic with any problems, questions or concerns. We can certainly see the patient much sooner if necessary.  Patient and plan discussed with Dr. Farrel Gobble and he is in agreement with the aforementioned.   Patient and plan discussed with Dr. Farrel Gobble and he is in agreement with the aforementioned.   KEFALAS,THOMAS

## 2013-08-22 ENCOUNTER — Encounter (HOSPITAL_BASED_OUTPATIENT_CLINIC_OR_DEPARTMENT_OTHER): Payer: Medicare Other | Admitting: Oncology

## 2013-08-22 ENCOUNTER — Encounter (HOSPITAL_BASED_OUTPATIENT_CLINIC_OR_DEPARTMENT_OTHER): Payer: Medicare Other

## 2013-08-22 VITALS — BP 141/82 | HR 87 | Temp 98.5°F | Resp 20 | Wt 167.0 lb

## 2013-08-22 DIAGNOSIS — Z5111 Encounter for antineoplastic chemotherapy: Secondary | ICD-10-CM

## 2013-08-22 DIAGNOSIS — R51 Headache: Secondary | ICD-10-CM

## 2013-08-22 DIAGNOSIS — I10 Essential (primary) hypertension: Secondary | ICD-10-CM

## 2013-08-22 DIAGNOSIS — C3490 Malignant neoplasm of unspecified part of unspecified bronchus or lung: Secondary | ICD-10-CM | POA: Diagnosis not present

## 2013-08-22 DIAGNOSIS — R918 Other nonspecific abnormal finding of lung field: Secondary | ICD-10-CM

## 2013-08-22 DIAGNOSIS — E039 Hypothyroidism, unspecified: Secondary | ICD-10-CM

## 2013-08-22 DIAGNOSIS — C3430 Malignant neoplasm of lower lobe, unspecified bronchus or lung: Secondary | ICD-10-CM

## 2013-08-22 DIAGNOSIS — C3492 Malignant neoplasm of unspecified part of left bronchus or lung: Secondary | ICD-10-CM

## 2013-08-22 DIAGNOSIS — K219 Gastro-esophageal reflux disease without esophagitis: Secondary | ICD-10-CM | POA: Diagnosis not present

## 2013-08-22 DIAGNOSIS — D638 Anemia in other chronic diseases classified elsewhere: Secondary | ICD-10-CM

## 2013-08-22 DIAGNOSIS — J449 Chronic obstructive pulmonary disease, unspecified: Secondary | ICD-10-CM | POA: Diagnosis not present

## 2013-08-22 DIAGNOSIS — Z510 Encounter for antineoplastic radiation therapy: Secondary | ICD-10-CM | POA: Diagnosis not present

## 2013-08-22 DIAGNOSIS — E785 Hyperlipidemia, unspecified: Secondary | ICD-10-CM | POA: Diagnosis not present

## 2013-08-22 LAB — CBC WITH DIFFERENTIAL/PLATELET
BASOS ABS: 0 10*3/uL (ref 0.0–0.1)
Basophils Relative: 2 % — ABNORMAL HIGH (ref 0–1)
EOS ABS: 0.1 10*3/uL (ref 0.0–0.7)
Eosinophils Relative: 3 % (ref 0–5)
HCT: 28.2 % — ABNORMAL LOW (ref 36.0–46.0)
Hemoglobin: 9.2 g/dL — ABNORMAL LOW (ref 12.0–15.0)
Lymphocytes Relative: 16 % (ref 12–46)
Lymphs Abs: 0.4 10*3/uL — ABNORMAL LOW (ref 0.7–4.0)
MCH: 32.3 pg (ref 26.0–34.0)
MCHC: 32.6 g/dL (ref 30.0–36.0)
MCV: 98.9 fL (ref 78.0–100.0)
MONOS PCT: 10 % (ref 3–12)
Monocytes Absolute: 0.3 10*3/uL (ref 0.1–1.0)
NEUTROS ABS: 1.9 10*3/uL (ref 1.7–7.7)
NEUTROS PCT: 70 % (ref 43–77)
Platelets: 289 10*3/uL (ref 150–400)
RBC: 2.85 MIL/uL — ABNORMAL LOW (ref 3.87–5.11)
RDW: 14 % (ref 11.5–15.5)
WBC: 2.7 10*3/uL — ABNORMAL LOW (ref 4.0–10.5)

## 2013-08-22 LAB — BASIC METABOLIC PANEL
BUN: 16 mg/dL (ref 6–23)
CHLORIDE: 101 meq/L (ref 96–112)
CO2: 25 mEq/L (ref 19–32)
CREATININE: 1.81 mg/dL — AB (ref 0.50–1.10)
Calcium: 9.5 mg/dL (ref 8.4–10.5)
GFR calc Af Amer: 30 mL/min — ABNORMAL LOW (ref >90)
GFR, EST NON AFRICAN AMERICAN: 26 mL/min — AB (ref >90)
Glucose, Bld: 131 mg/dL — ABNORMAL HIGH (ref 70–99)
POTASSIUM: 4.2 meq/L (ref 3.7–5.3)
Sodium: 137 mEq/L (ref 137–147)

## 2013-08-22 MED ORDER — DIPHENHYDRAMINE HCL 50 MG/ML IJ SOLN
50.0000 mg | Freq: Once | INTRAMUSCULAR | Status: AC
  Administered 2013-08-22: 50 mg via INTRAVENOUS

## 2013-08-22 MED ORDER — FAMOTIDINE IN NACL 20-0.9 MG/50ML-% IV SOLN
20.0000 mg | Freq: Once | INTRAVENOUS | Status: AC
  Administered 2013-08-22: 20 mg via INTRAVENOUS

## 2013-08-22 MED ORDER — SODIUM CHLORIDE 0.9 % IV SOLN: 16.0000 mg | Freq: Once | INTRAVENOUS | Status: DC

## 2013-08-22 MED ORDER — DIPHENHYDRAMINE HCL 50 MG/ML IJ SOLN
INTRAMUSCULAR | Status: AC
  Filled 2013-08-22: qty 1

## 2013-08-22 MED ORDER — SODIUM CHLORIDE 0.9 % IV SOLN
Freq: Once | INTRAVENOUS | Status: AC
  Administered 2013-08-22: 500 mL via INTRAVENOUS

## 2013-08-22 MED ORDER — DEXAMETHASONE SODIUM PHOSPHATE 10 MG/ML IJ SOLN: 20.0000 mg | Freq: Once | INTRAMUSCULAR | Status: DC

## 2013-08-22 MED ORDER — SODIUM CHLORIDE 0.9 % IV SOLN
110.2000 mg | Freq: Once | INTRAVENOUS | Status: AC
  Administered 2013-08-22: 110 mg via INTRAVENOUS
  Filled 2013-08-22: qty 11

## 2013-08-22 MED ORDER — HEPARIN SOD (PORK) LOCK FLUSH 100 UNIT/ML IV SOLN
500.0000 [IU] | Freq: Once | INTRAVENOUS | Status: AC | PRN
Start: 2013-08-22 — End: 2013-08-22
  Administered 2013-08-22: 500 [IU]
  Filled 2013-08-22: qty 5

## 2013-08-22 MED ORDER — SODIUM CHLORIDE 0.9 % IV SOLN
Freq: Once | INTRAVENOUS | Status: AC
  Administered 2013-08-22: 16 mg via INTRAVENOUS
  Filled 2013-08-22: qty 8

## 2013-08-22 MED ORDER — FAMOTIDINE IN NACL 20-0.9 MG/50ML-% IV SOLN
INTRAVENOUS | Status: AC
  Filled 2013-08-22: qty 50

## 2013-08-22 MED ORDER — SODIUM CHLORIDE 0.9 % IJ SOLN: 10.0000 mL | INTRAMUSCULAR | Status: DC | PRN

## 2013-08-22 MED ORDER — PACLITAXEL CHEMO INJECTION 300 MG/50ML
45.0000 mg/m2 | Freq: Once | INTRAVENOUS | Status: AC
  Administered 2013-08-22: 84 mg via INTRAVENOUS
  Filled 2013-08-22: qty 14

## 2013-08-22 NOTE — Patient Instructions (Addendum)
..  [facility] Discharge Instructions  RECOMMENDATIONS MADE BY THE CONSULTANT AND ANY TEST RESULTS WILL BE SENT TO YOUR REFERRING PHYSICIAN.  EXAM FINDINGS BY THE PHYSICIAN TODAY AND SIGNS OR SYMPTOMS TO REPORT TO CLINIC OR PRIMARY PHYSICIAN: Exam and findings as discussed by Kirby Crigler PA. From an oncology standpoint, there is no absolute contraindication for you driving. Tom recommends you be very careful and arose particular concern this time of year given the coldness and recent snowfall    Concomitant chemoradiation with weekly Carboplatin/Paclitaxel as scheduled/planned.   MRI brain with and without contrast next week.   Return as scheduled next week for follow-up   INSTRUCTIONS/FOLLOW-UP: Weekly chemo  Thank you for choosing [facility] to provide your oncology and hematology care.  To afford each patient quality time with our providers, please arrive at least 15 minutes before your scheduled appointment time.  With your help, our goal is to use those 15 minutes to complete the necessary work-up to ensure our physicians have the information they need to help with your evaluation and healthcare recommendations.    Effective January 1st, 2014, we ask that you re-schedule your appointment with our physicians should you arrive 10 or more minutes late for your appointment.  We strive to give you quality time with our providers, and arriving late affects you and other patients whose appointments are after yours.    Again, thank you for choosing [facility].  Our hope is that these requests will decrease the amount of time that you wait before being seen by our physicians.       _____________________________________________________________  Should you have questions after your visit to [facility], please contact our office at [phone] between the hours of 8:30 a.m. and 5:00 p.m.  Voicemails left after 4:30 p.m. will not be  returned until the following business day.  For prescription refill requests, have your pharmacy contact our office with your prescription refill request.

## 2013-08-22 NOTE — Progress Notes (Signed)
Tolerated well

## 2013-08-25 DIAGNOSIS — C3490 Malignant neoplasm of unspecified part of unspecified bronchus or lung: Secondary | ICD-10-CM | POA: Diagnosis not present

## 2013-08-25 DIAGNOSIS — Z79899 Other long term (current) drug therapy: Secondary | ICD-10-CM | POA: Diagnosis not present

## 2013-08-25 DIAGNOSIS — R05 Cough: Secondary | ICD-10-CM | POA: Diagnosis not present

## 2013-08-25 DIAGNOSIS — R059 Cough, unspecified: Secondary | ICD-10-CM | POA: Diagnosis not present

## 2013-08-25 DIAGNOSIS — R1310 Dysphagia, unspecified: Secondary | ICD-10-CM | POA: Diagnosis not present

## 2013-08-25 DIAGNOSIS — Z510 Encounter for antineoplastic radiation therapy: Secondary | ICD-10-CM | POA: Diagnosis not present

## 2013-08-26 ENCOUNTER — Encounter: Payer: Medicare Other | Admitting: Nurse Practitioner

## 2013-08-26 ENCOUNTER — Other Ambulatory Visit: Payer: Self-pay | Admitting: Nurse Practitioner

## 2013-08-26 DIAGNOSIS — R05 Cough: Secondary | ICD-10-CM

## 2013-08-26 DIAGNOSIS — R1310 Dysphagia, unspecified: Secondary | ICD-10-CM | POA: Diagnosis not present

## 2013-08-26 DIAGNOSIS — R059 Cough, unspecified: Secondary | ICD-10-CM | POA: Diagnosis not present

## 2013-08-26 DIAGNOSIS — R058 Other specified cough: Secondary | ICD-10-CM

## 2013-08-26 DIAGNOSIS — Z79899 Other long term (current) drug therapy: Secondary | ICD-10-CM | POA: Diagnosis not present

## 2013-08-26 DIAGNOSIS — Z510 Encounter for antineoplastic radiation therapy: Secondary | ICD-10-CM | POA: Diagnosis not present

## 2013-08-26 DIAGNOSIS — C3490 Malignant neoplasm of unspecified part of unspecified bronchus or lung: Secondary | ICD-10-CM | POA: Diagnosis not present

## 2013-08-26 MED ORDER — BENZONATATE 200 MG PO CAPS: 200.0000 mg | ORAL_CAPSULE | Freq: Three times a day (TID) | ORAL | Status: DC | PRN

## 2013-08-27 ENCOUNTER — Ambulatory Visit (HOSPITAL_COMMUNITY)
Admission: RE | Admit: 2013-08-27 | Discharge: 2013-08-27 | Disposition: A | Discharge status: Home / Self Care | Payer: Medicare Other | Source: Ambulatory Visit | Attending: Oncology | Admitting: Oncology

## 2013-08-27 ENCOUNTER — Ambulatory Visit (HOSPITAL_COMMUNITY): Payer: Medicare Other | Admitting: Oncology

## 2013-08-27 ENCOUNTER — Inpatient Hospital Stay (HOSPITAL_COMMUNITY): Payer: Medicare Other

## 2013-08-27 DIAGNOSIS — R1310 Dysphagia, unspecified: Secondary | ICD-10-CM | POA: Diagnosis not present

## 2013-08-27 DIAGNOSIS — Z79899 Other long term (current) drug therapy: Secondary | ICD-10-CM | POA: Diagnosis not present

## 2013-08-27 DIAGNOSIS — C3490 Malignant neoplasm of unspecified part of unspecified bronchus or lung: Secondary | ICD-10-CM | POA: Diagnosis not present

## 2013-08-27 DIAGNOSIS — I6789 Other cerebrovascular disease: Secondary | ICD-10-CM | POA: Insufficient documentation

## 2013-08-27 DIAGNOSIS — R51 Headache: Secondary | ICD-10-CM | POA: Insufficient documentation

## 2013-08-27 DIAGNOSIS — R05 Cough: Secondary | ICD-10-CM | POA: Diagnosis not present

## 2013-08-27 DIAGNOSIS — C3492 Malignant neoplasm of unspecified part of left bronchus or lung: Secondary | ICD-10-CM

## 2013-08-27 DIAGNOSIS — Z510 Encounter for antineoplastic radiation therapy: Secondary | ICD-10-CM | POA: Diagnosis not present

## 2013-08-27 DIAGNOSIS — R059 Cough, unspecified: Secondary | ICD-10-CM | POA: Diagnosis not present

## 2013-08-27 MED ORDER — GADOBENATE DIMEGLUMINE 529 MG/ML IV SOLN
8.0000 mL | Freq: Once | INTRAVENOUS | Status: AC | PRN
  Administered 2013-08-27: 8 mL via INTRAVENOUS

## 2013-08-28 ENCOUNTER — Ambulatory Visit (HOSPITAL_COMMUNITY): Payer: Medicare Other | Admitting: Oncology

## 2013-08-28 ENCOUNTER — Inpatient Hospital Stay (HOSPITAL_COMMUNITY): Payer: Medicare Other

## 2013-08-28 DIAGNOSIS — R1310 Dysphagia, unspecified: Secondary | ICD-10-CM | POA: Diagnosis not present

## 2013-08-28 DIAGNOSIS — C3490 Malignant neoplasm of unspecified part of unspecified bronchus or lung: Secondary | ICD-10-CM | POA: Diagnosis not present

## 2013-08-28 DIAGNOSIS — Z510 Encounter for antineoplastic radiation therapy: Secondary | ICD-10-CM | POA: Diagnosis not present

## 2013-08-28 DIAGNOSIS — R059 Cough, unspecified: Secondary | ICD-10-CM | POA: Diagnosis not present

## 2013-08-28 DIAGNOSIS — R05 Cough: Secondary | ICD-10-CM | POA: Diagnosis not present

## 2013-08-28 DIAGNOSIS — Z79899 Other long term (current) drug therapy: Secondary | ICD-10-CM | POA: Diagnosis not present

## 2013-08-29 ENCOUNTER — Encounter (HOSPITAL_COMMUNITY): Payer: Self-pay

## 2013-08-29 ENCOUNTER — Encounter (HOSPITAL_BASED_OUTPATIENT_CLINIC_OR_DEPARTMENT_OTHER): Payer: Medicare Other

## 2013-08-29 ENCOUNTER — Encounter (HOSPITAL_COMMUNITY): Payer: Medicare Other | Attending: Hematology and Oncology

## 2013-08-29 VITALS — BP 118/70 | HR 90 | Temp 97.8°F | Resp 18 | Wt 158.0 lb

## 2013-08-29 DIAGNOSIS — Z452 Encounter for adjustment and management of vascular access device: Secondary | ICD-10-CM | POA: Diagnosis not present

## 2013-08-29 DIAGNOSIS — E039 Hypothyroidism, unspecified: Secondary | ICD-10-CM

## 2013-08-29 DIAGNOSIS — R222 Localized swelling, mass and lump, trunk: Secondary | ICD-10-CM | POA: Diagnosis not present

## 2013-08-29 DIAGNOSIS — Z5111 Encounter for antineoplastic chemotherapy: Secondary | ICD-10-CM | POA: Diagnosis not present

## 2013-08-29 DIAGNOSIS — D509 Iron deficiency anemia, unspecified: Secondary | ICD-10-CM | POA: Diagnosis not present

## 2013-08-29 DIAGNOSIS — R1310 Dysphagia, unspecified: Secondary | ICD-10-CM | POA: Diagnosis not present

## 2013-08-29 DIAGNOSIS — C3490 Malignant neoplasm of unspecified part of unspecified bronchus or lung: Secondary | ICD-10-CM

## 2013-08-29 DIAGNOSIS — J449 Chronic obstructive pulmonary disease, unspecified: Secondary | ICD-10-CM

## 2013-08-29 DIAGNOSIS — D638 Anemia in other chronic diseases classified elsewhere: Secondary | ICD-10-CM | POA: Diagnosis not present

## 2013-08-29 DIAGNOSIS — Z79899 Other long term (current) drug therapy: Secondary | ICD-10-CM | POA: Diagnosis not present

## 2013-08-29 DIAGNOSIS — C3430 Malignant neoplasm of lower lobe, unspecified bronchus or lung: Secondary | ICD-10-CM

## 2013-08-29 DIAGNOSIS — R059 Cough, unspecified: Secondary | ICD-10-CM | POA: Diagnosis not present

## 2013-08-29 DIAGNOSIS — R918 Other nonspecific abnormal finding of lung field: Secondary | ICD-10-CM

## 2013-08-29 DIAGNOSIS — C3492 Malignant neoplasm of unspecified part of left bronchus or lung: Secondary | ICD-10-CM

## 2013-08-29 DIAGNOSIS — R05 Cough: Secondary | ICD-10-CM | POA: Diagnosis not present

## 2013-08-29 DIAGNOSIS — I10 Essential (primary) hypertension: Secondary | ICD-10-CM | POA: Diagnosis not present

## 2013-08-29 DIAGNOSIS — Z510 Encounter for antineoplastic radiation therapy: Secondary | ICD-10-CM | POA: Diagnosis not present

## 2013-08-29 LAB — CBC WITH DIFFERENTIAL/PLATELET
BASOS ABS: 0 10*3/uL (ref 0.0–0.1)
Basophils Relative: 2 % — ABNORMAL HIGH (ref 0–1)
Eosinophils Absolute: 0.1 10*3/uL (ref 0.0–0.7)
Eosinophils Relative: 3 % (ref 0–5)
HEMATOCRIT: 26.6 % — AB (ref 36.0–46.0)
Hemoglobin: 9.1 g/dL — ABNORMAL LOW (ref 12.0–15.0)
LYMPHS PCT: 16 % (ref 12–46)
Lymphs Abs: 0.3 10*3/uL — ABNORMAL LOW (ref 0.7–4.0)
MCH: 34 pg (ref 26.0–34.0)
MCHC: 34.2 g/dL (ref 30.0–36.0)
MCV: 99.3 fL (ref 78.0–100.0)
MONO ABS: 0.2 10*3/uL (ref 0.1–1.0)
Monocytes Relative: 12 % (ref 3–12)
NEUTROS ABS: 1.4 10*3/uL — AB (ref 1.7–7.7)
NEUTROS PCT: 68 % (ref 43–77)
Platelets: 202 10*3/uL (ref 150–400)
RBC: 2.68 MIL/uL — ABNORMAL LOW (ref 3.87–5.11)
RDW: 14.5 % (ref 11.5–15.5)
WBC: 2 10*3/uL — AB (ref 4.0–10.5)

## 2013-08-29 LAB — BASIC METABOLIC PANEL
BUN: 25 mg/dL — ABNORMAL HIGH (ref 6–23)
CHLORIDE: 104 meq/L (ref 96–112)
CO2: 23 meq/L (ref 19–32)
CREATININE: 2.17 mg/dL — AB (ref 0.50–1.10)
Calcium: 10 mg/dL (ref 8.4–10.5)
GFR calc non Af Amer: 21 mL/min — ABNORMAL LOW (ref >90)
GFR, EST AFRICAN AMERICAN: 24 mL/min — AB (ref >90)
Glucose, Bld: 97 mg/dL (ref 70–99)
POTASSIUM: 4.4 meq/L (ref 3.7–5.3)
SODIUM: 138 meq/L (ref 137–147)

## 2013-08-29 MED ORDER — DIPHENHYDRAMINE HCL 50 MG/ML IJ SOLN
50.0000 mg | Freq: Once | INTRAMUSCULAR | Status: AC
  Administered 2013-08-29: 50 mg via INTRAVENOUS

## 2013-08-29 MED ORDER — DEXAMETHASONE SODIUM PHOSPHATE 10 MG/ML IJ SOLN: 20.0000 mg | Freq: Once | INTRAMUSCULAR | Status: DC

## 2013-08-29 MED ORDER — SODIUM CHLORIDE 0.9 % IV SOLN
Freq: Once | INTRAVENOUS | Status: AC
  Administered 2013-08-29: 12:00:00 via INTRAVENOUS

## 2013-08-29 MED ORDER — DIPHENHYDRAMINE HCL 50 MG/ML IJ SOLN
INTRAMUSCULAR | Status: AC
  Filled 2013-08-29: qty 1

## 2013-08-29 MED ORDER — ALTEPLASE 2 MG IJ SOLR
INTRAMUSCULAR | Status: AC
  Filled 2013-08-29: qty 2

## 2013-08-29 MED ORDER — SODIUM CHLORIDE 0.9 % IV SOLN: 16.0000 mg | Freq: Once | INTRAVENOUS | Status: DC

## 2013-08-29 MED ORDER — PACLITAXEL CHEMO INJECTION 300 MG/50ML
45.0000 mg/m2 | Freq: Once | INTRAVENOUS | Status: AC
  Administered 2013-08-29: 84 mg via INTRAVENOUS
  Filled 2013-08-29: qty 14

## 2013-08-29 MED ORDER — STERILE WATER FOR INJECTION IJ SOLN
INTRAMUSCULAR | Status: AC
  Filled 2013-08-29: qty 10

## 2013-08-29 MED ORDER — SODIUM CHLORIDE 0.9 % IV SOLN
110.2000 mg | Freq: Once | INTRAVENOUS | Status: AC
  Administered 2013-08-29: 110 mg via INTRAVENOUS
  Filled 2013-08-29: qty 11

## 2013-08-29 MED ORDER — TEMAZEPAM 15 MG PO CAPS: 15.0000 mg | ORAL_CAPSULE | Freq: Every evening | ORAL | Status: DC | PRN

## 2013-08-29 MED ORDER — SODIUM CHLORIDE 0.9 % IV SOLN
Freq: Once | INTRAVENOUS | Status: AC
  Administered 2013-08-29: 16 mg via INTRAVENOUS
  Filled 2013-08-29: qty 8

## 2013-08-29 MED ORDER — FAMOTIDINE IN NACL 20-0.9 MG/50ML-% IV SOLN
INTRAVENOUS | Status: AC
  Filled 2013-08-29: qty 50

## 2013-08-29 MED ORDER — SODIUM CHLORIDE 0.9 % IJ SOLN: 10.0000 mL | INTRAMUSCULAR | Status: DC | PRN

## 2013-08-29 MED ORDER — HEPARIN SOD (PORK) LOCK FLUSH 100 UNIT/ML IV SOLN
500.0000 [IU] | Freq: Once | INTRAVENOUS | Status: AC | PRN
  Administered 2013-08-29: 500 [IU]
  Filled 2013-08-29: qty 5

## 2013-08-29 MED ORDER — ALTEPLASE 2 MG IJ SOLR
2.0000 mg | Freq: Once | INTRAMUSCULAR | Status: AC | PRN
  Administered 2013-08-29: 2 mg

## 2013-08-29 MED ORDER — FAMOTIDINE IN NACL 20-0.9 MG/50ML-% IV SOLN
20.0000 mg | Freq: Once | INTRAVENOUS | Status: AC
  Administered 2013-08-29: 20 mg via INTRAVENOUS

## 2013-08-29 NOTE — Progress Notes (Signed)
.  [name] arrived today for chemo. Port accessed and alteplase protocol followed due to no blood return. Blood return obtained after 45 min instillation. Tolerated chemo well.

## 2013-08-29 NOTE — Patient Instructions (Addendum)
..  [place] Discharge Instructions  RECOMMENDATIONS MADE BY THE CONSULTANT AND ANY TEST RESULTS WILL BE SENT TO YOUR REFERRING PHYSICIAN.  EXAM FINDINGS BY THE PHYSICIAN TODAY AND SIGNS OR SYMPTOMS TO REPORT TO CLINIC OR PRIMARY PHYSICIAN: Exam and findings as discussed by Dr. Barnet Glasgow.  MEDICATIONS PRESCRIBED:Stop Ambien and start  temazepam (RESTORIL) 15 MG capsule take one at bedtime as needed for sleep    INSTRUCTIONS/FOLLOW-UP: Return weekly   Thank you for choosing [facility] to provide your oncology and hematology care.  To afford each patient quality time with our providers, please arrive at least 15 minutes before your scheduled appointment time.  With your help, our goal is to use those 15 minutes to complete the necessary work-up to ensure our physicians have the information they need to help with your evaluation and healthcare recommendations.    Effective January 1st, 2014, we ask that you re-schedule your appointment with our physicians should you arrive 10 or more minutes late for your appointment.  We strive to give you quality time with our providers, and arriving late affects you and other patients whose appointments are after yours.    Again, thank you for choosing [facility].  Our hope is that these requests will decrease the amount of time that you wait before being seen by our physicians.       _____________________________________________________________  Should you have questions after your visit to [facility], please contact our office at [phone] between the hours of 8:30 a.m. and 5:00 p.m.  Voicemails left after 4:30 p.m. will not be returned until the following business day.  For prescription refill requests, have your pharmacy contact our office with your prescription refill request.

## 2013-08-29 NOTE — Progress Notes (Signed)
[facility]  OFFICE PROGRESS NOTE  [name], MD [place] [address]  DIAGNOSIS: Squamous cell carcinoma of left lung  Anemia, iron deficiency  Chronic obstructive pulmonary disease  Chief Complaint  Patient presents with  . Stage III lung cancer on combined modality therapy    Squamous cell carcinoma    CURRENT THERAPY: Weekly carboplatin/Taxol with concurrent daily radiotherapy started on 08/07/2013  INTERVAL HISTORY: Bernardina Cacho 79 y.o. 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 She is thus far received 3 weekly chemotherapy treatments. She has had problems staying asleep after taking Ambien at bedtime. She also has some cough during the night but without expectoration and without any hemoptysis. She denies any chest pain and has had relief of dysphagia utilizing Carafate. She denies any fever, night sweats, chest pain, lower extremity swelling or redness, diarrhea, constipation, melena, hematochezia, hematuria, urinary hesitancy, or peripheral paresthesias.  MEDICAL HISTORY: Past Medical History  Diagnosis Date  . Hypertension   . Hypothyroidism   . Hyperlipidemia   . Insomnia   . GERD (gastroesophageal reflux disease)     chronic gastritis  . Adrenal adenoma   . Chronic diarrhea   . Fatty liver   . Arthritis     knee  . QT prolongation     syncope  . Coronary artery disease   . Impaired fasting glucose   . Renal insufficiency     low protien diet  . Tobacco use     1/2 ppd, approx 25-50 pack years (as of 08/2012)  . Family history of heart disease   . IBS (irritable bowel  syndrome)   . Peripheral arterial disease     sstatus post  infrarenal abdominal aortic tube graft placed by Dr. Victorino Dike February 2008  . Cancer     Lung  . Squamous cell carcinoma of left lung 07/29/2013    INTERIM HISTORY: has Unspecified hypothyroidism; Pruritic dermatitis; HTN (hypertension); Chronic renal insufficiency; Other and unspecified hyperlipidemia; Generalized anxiety disorder; Insomnia; Esophageal reflux; Coronary artery disease; Essential hypertension; Hyperlipidemia; Peripheral arterial disease; Anemia, iron deficiency; Hemoptysis; and Squamous cell carcinoma of left lung on her problem list.     Squamous cell carcinoma of left lung    06/30/2013  Imaging  CT of chest demonstrates 4 cm left lower lobe lesion with other concerning findings for malignancy    07/09/2013  Imaging  PET- 4.1 cm left lower oobe lesion with left hilar and bilateral mediastinal lymphadeopathy. Hepatic lesion not confidently identified. 1.8 cm nodule in left upper lobe, cannot exclude low grade tumor.    07/16/2013  Initial Diagnosis  Squamous cell carcinoma of left lung via CT-guided biopsy by IR    07/29/2013  Surgery  IR placement of port-a-cath in preparation for concomittant chemoradiation    08/08/2013 -  Chemotherapy  Paclitaxel/Carboplatin weekly x 6      ALLERGIES:  is allergic to aleve; dilaudid; dyazide; and zithromax.  MEDICATIONS: has a current medication list which includes the following prescription(s): allopurinol, alprazolam, amlodipine, aspirin ec, benzonatate, cholecalciferol, enalapril, esomeprazole, fenofibrate, ferrous sulfate, furosemide, hydralazine, hydralazine, hydroxyzine, levothyroxine, lidocaine-prilocaine, loperamide, magnesium oxide, metoclopramide,  ondansetron, paroxetine, potassium chloride sa, pravastatin, prochlorperazine, tramadol, zolpidem, and guaifenesin.  SURGICAL HISTORY:  Past Surgical History  Procedure Laterality Date  . Abdominal hysterectomy    . Thyroidectomy,  partial    . Colonoscopy  5/04    normal  . Abdominal aortic aneurysm repair  07/2006    D. J.D. Kellie Simmering  . Cataract extraction Bilateral 2010  . Cardiac catheterization  02/2010    mod CAD in L-dominant system with normal LV function  . Colon resection  2005    1/2 colon removed  . Transthoracic echocardiogram  02/2010    EF 55-60%; mild conc LVH, normal systolic function; mildly calcified AV annulus  . Colonoscopy with esophagogastroduodenoscopy (egd) N/A 03/19/2013    Procedure: COLONOSCOPY WITH ESOPHAGOGASTRODUODENOSCOPY (EGD);  Surgeon: Rogene Houston, MD;  Location: [facility];  Service: Endoscopy;  Laterality: N/A;  145  . Dg biopsy lung Left Jan 2015    FAMILY HISTORY: family history includes Diabetes in her mother; Heart attack in her mother; Hyperlipidemia in her sister; Hypertension in her mother; Kidney disease in her brother.  SOCIAL HISTORY:  reports that she has been smoking Cigarettes.  She has a 29.5 pack-year smoking history. She has never used smokeless tobacco. She reports that she does not drink alcohol or use illicit drugs.  REVIEW OF SYSTEMS:  Other than that discussed above is noncontributory.  PHYSICAL EXAMINATION: ECOG PERFORMANCE STATUS: 1 - Symptomatic but completely ambulatory  There were no vitals taken for this visit.  GENERAL:alert, no distress and comfortable SKIN: skin color, texture, turgor are normal, no rashes or significant lesions EYES: PERLA; Conjunctiva are pink and non-injected, sclera clear OROPHARYNX:no exudate, no erythema on lips, buccal mucosa, or tongue. NECK: supple, thyroid normal size, non-tender, without nodularity. No masses CHEST: Increased AP diameter with no dullness to percussion. LYMPH:  no palpable lymphadenopathy in the cervical, axillary or inguinal LUNGS: clear to auscultation and percussion with normal breathing effort HEART: regular rate & rhythm and no murmurs. ABDOMEN:abdomen soft, non-tender and normal bowel  sounds MUSCULOSKELETAL:no cyanosis of digits and no clubbing. Range of motion normal.  NEURO: alert & oriented x 3 with fluent speech, no focal motor/sensory deficits   LABORATORY DATA: Infusion on 08/22/2013  Component Date Value Ref Range Status  . WBC 08/22/2013 2.7* 4.0 - 10.5 K/uL Final  . RBC 08/22/2013 2.85* 3.87 - 5.11 MIL/uL Final  . Hemoglobin 08/22/2013 9.2* 12.0 - 15.0 g/dL Final  . HCT 08/22/2013 28.2* 36.0 - 46.0 % Final  . MCV 08/22/2013 98.9  78.0 - 100.0 fL Final  . MCH 08/22/2013 32.3  26.0 - 34.0 pg Final  . MCHC 08/22/2013 32.6  30.0 - 36.0 g/dL Final  . RDW 08/22/2013 14.0  11.5 - 15.5 % Final  . Platelets 08/22/2013 289  150 - 400 K/uL Final  . Neutrophils Relative % 08/22/2013 70  43 - 77 % Final  . Neutro Abs 08/22/2013 1.9  1.7 - 7.7 K/uL Final  . Lymphocytes Relative 08/22/2013 16  12 - 46 % Final  . Lymphs Abs 08/22/2013 0.4* 0.7 - 4.0 K/uL Final  . Monocytes Relative 08/22/2013 10  3 - 12 % Final  . Monocytes Absolute 08/22/2013 0.3  0.1 - 1.0 K/uL Final  . Eosinophils Relative 08/22/2013 3  0 - 5 % Final  . Eosinophils Absolute 08/22/2013 0.1  0.0 - 0.7 K/uL Final  . Basophils Relative 08/22/2013 2* 0 - 1 % Final  . Basophils Absolute 08/22/2013 0.0  0.0 -  0.1 K/uL Final  . Sodium 08/22/2013 137  137 - 147 mEq/L Final  . Potassium 08/22/2013 4.2  3.7 - 5.3 mEq/L Final  . Chloride 08/22/2013 101  96 - 112 mEq/L Final  . CO2 08/22/2013 25  19 - 32 mEq/L Final  . Glucose, Bld 08/22/2013 131* 70 - 99 mg/dL Final  . BUN 08/22/2013 16  6 - 23 mg/dL Final  . Creatinine, Ser 08/22/2013 1.81* 0.50 - 1.10 mg/dL Final  . Calcium 08/22/2013 9.5  8.4 - 10.5 mg/dL Final  . GFR calc non Af Amer 08/22/2013 26* >90 mL/min Final  . GFR calc Af Amer 08/22/2013 30* >90 mL/min Final   Comment: (NOTE)                          The eGFR has been calculated using the CKD EPI equation.                          This calculation has not been validated in all clinical  situations.                          eGFR's persistently <90 mL/min signify possible Chronic Kidney                          Disease.  Infusion on 08/15/2013  Component Date Value Ref Range Status  . WBC 08/15/2013 4.3  4.0 - 10.5 K/uL Final  . RBC 08/15/2013 2.80* 3.87 - 5.11 MIL/uL Final  . Hemoglobin 08/15/2013 9.4* 12.0 - 15.0 g/dL Final  . HCT 08/15/2013 27.6* 36.0 - 46.0 % Final  . MCV 08/15/2013 98.6  78.0 - 100.0 fL Final  . MCH 08/15/2013 33.6  26.0 - 34.0 pg Final  . MCHC 08/15/2013 34.1  30.0 - 36.0 g/dL Final  . RDW 08/15/2013 13.7  11.5 - 15.5 % Final  . Platelets 08/15/2013 325  150 - 400 K/uL Final  . Neutrophils Relative % 08/15/2013 75  43 - 77 % Final  . Neutro Abs 08/15/2013 3.2  1.7 - 7.7 K/uL Final  . Lymphocytes Relative 08/15/2013 13  12 - 46 % Final  . Lymphs Abs 08/15/2013 0.6* 0.7 - 4.0 K/uL Final  . Monocytes Relative 08/15/2013 8  3 - 12 % Final  . Monocytes Absolute 08/15/2013 0.3  0.1 - 1.0 K/uL Final  . Eosinophils Relative 08/15/2013 4  0 - 5 % Final  . Eosinophils Absolute 08/15/2013 0.2  0.0 - 0.7 K/uL Final  . Basophils Relative 08/15/2013 1  0 - 1 % Final  . Basophils Absolute 08/15/2013 0.0  0.0 - 0.1 K/uL Final  . Sodium 08/15/2013 139  137 - 147 mEq/L Final  . Potassium 08/15/2013 3.3* 3.7 - 5.3 mEq/L Final  . Chloride 08/15/2013 99  96 - 112 mEq/L Final  . CO2 08/15/2013 30  19 - 32 mEq/L Final  . Glucose, Bld 08/15/2013 87  70 - 99 mg/dL Final  . BUN 08/15/2013 17  6 - 23 mg/dL Final  . Creatinine, Ser 08/15/2013 1.76* 0.50 - 1.10 mg/dL Final  . Calcium 08/15/2013 9.3  8.4 - 10.5 mg/dL Final  . GFR calc non Af Amer 08/15/2013 27* >90 mL/min Final  . GFR calc Af Amer 08/15/2013 31* >90 mL/min Final   Comment: (NOTE)  The eGFR has been calculated using the CKD EPI equation.                          This calculation has not been validated in all clinical situations.                          eGFR's persistently <90  mL/min signify possible Chronic Kidney                          Disease.  Infusion on 08/08/2013  Component Date Value Ref Range Status  . Sodium 08/08/2013 140  137 - 147 mEq/L Final  . Potassium 08/08/2013 3.2* 3.7 - 5.3 mEq/L Final  . Chloride 08/08/2013 101  96 - 112 mEq/L Final  . CO2 08/08/2013 28  19 - 32 mEq/L Final  . Glucose, Bld 08/08/2013 93  70 - 99 mg/dL Final  . BUN 08/08/2013 14  6 - 23 mg/dL Final  . Creatinine, Ser 08/08/2013 1.88* 0.50 - 1.10 mg/dL Final  . Calcium 08/08/2013 9.3  8.4 - 10.5 mg/dL Final  . Total Protein 08/08/2013 7.1  6.0 - 8.3 g/dL Final  . Albumin 08/08/2013 3.6  3.5 - 5.2 g/dL Final  . AST 08/08/2013 19  0 - 37 U/L Final  . ALT 08/08/2013 8  0 - 35 U/L Final  . Alkaline Phosphatase 08/08/2013 56  39 - 117 U/L Final  . Total Bilirubin 08/08/2013 0.4  0.3 - 1.2 mg/dL Final  . GFR calc non Af Amer 08/08/2013 25* >90 mL/min Final  . GFR calc Af Amer 08/08/2013 29* >90 mL/min Final   Comment: (NOTE)                          The eGFR has been calculated using the CKD EPI equation.                          This calculation has not been validated in all clinical situations.                          eGFR's persistently <90 mL/min signify possible Chronic Kidney                          Disease.  . WBC 08/08/2013 4.9  4.0 - 10.5 K/uL Final  . RBC 08/08/2013 3.18* 3.87 - 5.11 MIL/uL Final  . Hemoglobin 08/08/2013 10.6* 12.0 - 15.0 g/dL Final  . HCT 08/08/2013 31.7* 36.0 - 46.0 % Final  . MCV 08/08/2013 99.7  78.0 - 100.0 fL Final  . MCH 08/08/2013 33.3  26.0 - 34.0 pg Final  . MCHC 08/08/2013 33.4  30.0 - 36.0 g/dL Final  . RDW 08/08/2013 14.0  11.5 - 15.5 % Final  . Platelets 08/08/2013 253  150 - 400 K/uL Final  . Neutrophils Relative % 08/08/2013 65  43 - 77 % Final  . Neutro Abs 08/08/2013 3.2  1.7 - 7.7 K/uL Final  . Lymphocytes Relative 08/08/2013 19  12 - 46 % Final  . Lymphs Abs 08/08/2013 0.9  0.7 - 4.0 K/uL Final  . Monocytes Relative  08/08/2013 11  3 - 12 % Final  . Monocytes Absolute 08/08/2013 0.6  0.1 - 1.0 K/uL Final  .  Eosinophils Relative 08/08/2013 4  0 - 5 % Final  . Eosinophils Absolute 08/08/2013 0.2  0.0 - 0.7 K/uL Final  . Basophils Relative 08/08/2013 1  0 - 1 % Final  . Basophils Absolute 08/08/2013 0.0  0.0 - 0.1 K/uL Final    PATHOLOGY: Squamous cell carcinoma, attempts at undergoing [facility] analysis were unsuccessful because of inadequate tissue sample.  Urinalysis    Component Value Date/Time   COLORURINE YELLOW 03/20/2010 1405   APPEARANCEUR CLEAR 03/20/2010 1405   LABSPEC 1.015 03/20/2010 1405   PHURINE 6.0 03/20/2010 1405   GLUCOSEU NEGATIVE 03/20/2010 1405   HGBUR SMALL* 03/20/2010 [place] 03/20/2010 [place] 03/20/2010 [place] 03/20/2010 1405   UROBILINOGEN 0.2 03/20/2010 1405   NITRITE NEGATIVE 03/20/2010 1405   LEUKOCYTESUR NEGATIVE 03/20/2010 1405    RADIOGRAPHIC STUDIES: Mr Jeri Cos Wo Contrast  30-Aug-2013   CLINICAL DATA:  Squamous cell carcinoma of lung. Headache. Rule out metastatic disease  EXAM: MRI HEAD WITHOUT AND WITH CONTRAST  TECHNIQUE: Multiplanar, multiecho pulse sequences of the brain and surrounding structures were obtained without and with intravenous contrast.  CONTRAST:  46m MULTIHANCE GADOBENATE DIMEGLUMINE 529 MG/ML IV SOLN  COMPARISON:  CT head 03/20/2010  FINDINGS: Ventricle size is normal.  Cerebral volume is normal for age.  Small hyperintensities in the cerebral white matter bilaterally and in the right midbrain, most consistent with chronic microvascular ischemia. No acute infarct.  Negative for hemorrhage or mass.  No edema is present  Postcontrast imaging reveals normal enhancement. No enhancing metastatic deposits are identified.  Mucous retention cyst left maxillary sinus. Remaining sinuses are clear.  IMPRESSION: Negative for metastatic disease.  Chronic microvascular ischemic change.  No acute infarct.    Electronically Signed   By: CFranchot GalloM.D.   On: 02015/08/712:[id number]    ASSESSMENT:  #1.Stage IIIB squamous cell carcinoma with a 4.1 cm left lower lobe lesion, left hilar and bilateral mediastinal lymphadenopathy (questionable hepatic lesion and left upper lobe of lung lesion). Undergoing chemoradiation with weekly Carboplatin/Paclitaxel beginning on 08/07/2013.  2. Chronic obstructive pulmonary disease  3. Gastroesophageal reflux disease, on treatment.  4. Hypertension, controlled.  5. Hypothyroidism, on treatment.  6. Anemia of chronic disease and chronic blood loss.  7. Right frontal headache x 2 months, , normal brain MRI with pain probably due to sinus inflammation. #8. Insomnia not controlled with Ambien. #9. Dysphagia controlled with Carafate, secondary to esophagitis.    PLAN:  #1. Carboplatin/Taxol cycle number 4 today. #2. Discontinue Ambien and substitute temazepam 15 mg at bedtime. #3. Continue daily radiotherapy. #4. Office visit one week with CBC and BMP.   All questions were answered. The patient knows to call the clinic with any problems, questions or concerns. We can certainly see the patient much sooner if necessary.   I spent 25 minutes counseling the patient face to face. The total time spent in the appointment was 30 minutes.    FDoroteo Bradford MD 08/29/2013 11:06 AM

## 2013-08-30 ENCOUNTER — Other Ambulatory Visit (HOSPITAL_COMMUNITY): Payer: Self-pay | Admitting: Oncology

## 2013-09-01 DIAGNOSIS — R059 Cough, unspecified: Secondary | ICD-10-CM | POA: Diagnosis not present

## 2013-09-01 DIAGNOSIS — Z510 Encounter for antineoplastic radiation therapy: Secondary | ICD-10-CM | POA: Diagnosis not present

## 2013-09-01 DIAGNOSIS — R1310 Dysphagia, unspecified: Secondary | ICD-10-CM | POA: Diagnosis not present

## 2013-09-01 DIAGNOSIS — Z79899 Other long term (current) drug therapy: Secondary | ICD-10-CM | POA: Diagnosis not present

## 2013-09-01 DIAGNOSIS — R05 Cough: Secondary | ICD-10-CM | POA: Diagnosis not present

## 2013-09-01 DIAGNOSIS — C3490 Malignant neoplasm of unspecified part of unspecified bronchus or lung: Secondary | ICD-10-CM | POA: Diagnosis not present

## 2013-09-02 ENCOUNTER — Encounter: Payer: Self-pay | Admitting: Family Medicine

## 2013-09-02 ENCOUNTER — Ambulatory Visit (INDEPENDENT_AMBULATORY_CARE_PROVIDER_SITE_OTHER): Payer: Medicare Other | Admitting: Family Medicine

## 2013-09-02 VITALS — BP 128/82 | Ht 67.0 in | Wt 165.0 lb

## 2013-09-02 DIAGNOSIS — R059 Cough, unspecified: Secondary | ICD-10-CM | POA: Diagnosis not present

## 2013-09-02 DIAGNOSIS — C3492 Malignant neoplasm of unspecified part of left bronchus or lung: Secondary | ICD-10-CM

## 2013-09-02 DIAGNOSIS — Z79899 Other long term (current) drug therapy: Secondary | ICD-10-CM | POA: Diagnosis not present

## 2013-09-02 DIAGNOSIS — R1310 Dysphagia, unspecified: Secondary | ICD-10-CM | POA: Diagnosis not present

## 2013-09-02 DIAGNOSIS — C3490 Malignant neoplasm of unspecified part of unspecified bronchus or lung: Secondary | ICD-10-CM | POA: Diagnosis not present

## 2013-09-02 DIAGNOSIS — R05 Cough: Secondary | ICD-10-CM | POA: Diagnosis not present

## 2013-09-02 DIAGNOSIS — Z510 Encounter for antineoplastic radiation therapy: Secondary | ICD-10-CM | POA: Diagnosis not present

## 2013-09-02 NOTE — Progress Notes (Signed)
   Subjective:    Patient ID: [name], female    DOB: 1934-07-22, 79 y.o.   MRN: [id number]  HPI Patient arrives to discuss her current cancer treatments and catch up on specialist recommendations.  Patient arrives for office for a protracted discussion.  She's been diagnosed with lung cancer. Unfortunately workup has shown metastasis to her liver, along with central hilar lymph nodes.  The oncologist have initiated radiation therapy and chemotherapy.  Patient has chronic anxiety and understandably this is worsened.  Having slight nausea at times controlled well by medication.  Not as much appetite and does not really like sure.  With patient present oncology notes are reviewed and red.  Review of Systems Some chest pain. No back pain no change in bowel habits no loss of consciousness no abdominal pain ROS otherwise negative    Objective:   Physical Exam Alert no apparent distress HEENT normal neck supple no lymphadenopathy palpated lungs clear heart regular in rhythm.       Assessment & Plan:  Impression 1 lung cancer discussed at great length. Patient wonders if I agree with the current treatment. I do believe that the oncologist are doing an excellent job, and are providing all the current recommendations. This is discussed with patient recommend trying [employer]. Followup as scheduled. Easily 25 minutes spent most in discussion. WSL

## 2013-09-03 ENCOUNTER — Inpatient Hospital Stay (HOSPITAL_COMMUNITY): Payer: Medicare Other

## 2013-09-03 DIAGNOSIS — R1310 Dysphagia, unspecified: Secondary | ICD-10-CM | POA: Diagnosis not present

## 2013-09-03 DIAGNOSIS — Z79899 Other long term (current) drug therapy: Secondary | ICD-10-CM | POA: Diagnosis not present

## 2013-09-03 DIAGNOSIS — Z510 Encounter for antineoplastic radiation therapy: Secondary | ICD-10-CM | POA: Diagnosis not present

## 2013-09-03 DIAGNOSIS — C3490 Malignant neoplasm of unspecified part of unspecified bronchus or lung: Secondary | ICD-10-CM | POA: Diagnosis not present

## 2013-09-03 DIAGNOSIS — R05 Cough: Secondary | ICD-10-CM | POA: Diagnosis not present

## 2013-09-03 DIAGNOSIS — R059 Cough, unspecified: Secondary | ICD-10-CM | POA: Diagnosis not present

## 2013-09-04 ENCOUNTER — Inpatient Hospital Stay (HOSPITAL_COMMUNITY): Payer: Medicare Other

## 2013-09-04 ENCOUNTER — Ambulatory Visit (HOSPITAL_COMMUNITY): Payer: Medicare Other | Admitting: Oncology

## 2013-09-04 DIAGNOSIS — Z79899 Other long term (current) drug therapy: Secondary | ICD-10-CM | POA: Diagnosis not present

## 2013-09-04 DIAGNOSIS — Z510 Encounter for antineoplastic radiation therapy: Secondary | ICD-10-CM | POA: Diagnosis not present

## 2013-09-04 DIAGNOSIS — R05 Cough: Secondary | ICD-10-CM | POA: Diagnosis not present

## 2013-09-04 DIAGNOSIS — R059 Cough, unspecified: Secondary | ICD-10-CM | POA: Diagnosis not present

## 2013-09-04 DIAGNOSIS — R1310 Dysphagia, unspecified: Secondary | ICD-10-CM | POA: Diagnosis not present

## 2013-09-04 DIAGNOSIS — C3490 Malignant neoplasm of unspecified part of unspecified bronchus or lung: Secondary | ICD-10-CM | POA: Diagnosis not present

## 2013-09-05 ENCOUNTER — Encounter (HOSPITAL_COMMUNITY): Payer: Self-pay

## 2013-09-05 ENCOUNTER — Ambulatory Visit (HOSPITAL_COMMUNITY): Payer: Medicare Other | Admitting: Oncology

## 2013-09-05 ENCOUNTER — Encounter (HOSPITAL_BASED_OUTPATIENT_CLINIC_OR_DEPARTMENT_OTHER): Payer: Medicare Other

## 2013-09-05 VITALS — BP 148/69 | HR 100 | Temp 99.4°F | Resp 18 | Wt 159.4 lb

## 2013-09-05 DIAGNOSIS — K209 Esophagitis, unspecified without bleeding: Secondary | ICD-10-CM | POA: Diagnosis not present

## 2013-09-05 DIAGNOSIS — C3430 Malignant neoplasm of lower lobe, unspecified bronchus or lung: Secondary | ICD-10-CM

## 2013-09-05 DIAGNOSIS — K219 Gastro-esophageal reflux disease without esophagitis: Secondary | ICD-10-CM

## 2013-09-05 DIAGNOSIS — Z510 Encounter for antineoplastic radiation therapy: Secondary | ICD-10-CM | POA: Diagnosis not present

## 2013-09-05 DIAGNOSIS — C3492 Malignant neoplasm of unspecified part of left bronchus or lung: Secondary | ICD-10-CM

## 2013-09-05 DIAGNOSIS — D72819 Decreased white blood cell count, unspecified: Secondary | ICD-10-CM

## 2013-09-05 DIAGNOSIS — J449 Chronic obstructive pulmonary disease, unspecified: Secondary | ICD-10-CM | POA: Diagnosis not present

## 2013-09-05 DIAGNOSIS — C3490 Malignant neoplasm of unspecified part of unspecified bronchus or lung: Secondary | ICD-10-CM

## 2013-09-05 DIAGNOSIS — R05 Cough: Secondary | ICD-10-CM | POA: Diagnosis not present

## 2013-09-05 DIAGNOSIS — R1310 Dysphagia, unspecified: Secondary | ICD-10-CM | POA: Diagnosis not present

## 2013-09-05 DIAGNOSIS — Z5111 Encounter for antineoplastic chemotherapy: Secondary | ICD-10-CM

## 2013-09-05 DIAGNOSIS — D500 Iron deficiency anemia secondary to blood loss (chronic): Secondary | ICD-10-CM | POA: Diagnosis not present

## 2013-09-05 DIAGNOSIS — R918 Other nonspecific abnormal finding of lung field: Secondary | ICD-10-CM

## 2013-09-05 DIAGNOSIS — R059 Cough, unspecified: Secondary | ICD-10-CM | POA: Diagnosis not present

## 2013-09-05 DIAGNOSIS — R222 Localized swelling, mass and lump, trunk: Secondary | ICD-10-CM | POA: Diagnosis not present

## 2013-09-05 DIAGNOSIS — Z79899 Other long term (current) drug therapy: Secondary | ICD-10-CM | POA: Diagnosis not present

## 2013-09-05 LAB — CBC WITH DIFFERENTIAL/PLATELET
Basophils Absolute: 0 10*3/uL (ref 0.0–0.1)
Basophils Relative: 0 % (ref 0–1)
Eosinophils Absolute: 0 10*3/uL (ref 0.0–0.7)
Eosinophils Relative: 1 % (ref 0–5)
HCT: 26.8 % — ABNORMAL LOW (ref 36.0–46.0)
HEMOGLOBIN: 9.2 g/dL — AB (ref 12.0–15.0)
LYMPHS ABS: 0.3 10*3/uL — AB (ref 0.7–4.0)
LYMPHS PCT: 12 % (ref 12–46)
MCH: 33.7 pg (ref 26.0–34.0)
MCHC: 34.3 g/dL (ref 30.0–36.0)
MCV: 98.2 fL (ref 78.0–100.0)
MONOS PCT: 13 % — AB (ref 3–12)
Monocytes Absolute: 0.3 10*3/uL (ref 0.1–1.0)
NEUTROS PCT: 73 % (ref 43–77)
Neutro Abs: 1.7 10*3/uL (ref 1.7–7.7)
Platelets: 178 10*3/uL (ref 150–400)
RBC: 2.73 MIL/uL — AB (ref 3.87–5.11)
RDW: 14.9 % (ref 11.5–15.5)
WBC: 2.3 10*3/uL — AB (ref 4.0–10.5)

## 2013-09-05 LAB — BASIC METABOLIC PANEL
BUN: 31 mg/dL — ABNORMAL HIGH (ref 6–23)
CHLORIDE: 102 meq/L (ref 96–112)
CO2: 27 meq/L (ref 19–32)
Calcium: 10.1 mg/dL (ref 8.4–10.5)
Creatinine, Ser: 2.13 mg/dL — ABNORMAL HIGH (ref 0.50–1.10)
GFR calc Af Amer: 25 mL/min — ABNORMAL LOW (ref >90)
GFR, EST NON AFRICAN AMERICAN: 21 mL/min — AB (ref >90)
GLUCOSE: 116 mg/dL — AB (ref 70–99)
POTASSIUM: 4.2 meq/L (ref 3.7–5.3)
SODIUM: 140 meq/L (ref 137–147)

## 2013-09-05 MED ORDER — SODIUM CHLORIDE 0.9 % IV SOLN: 16.0000 mg | Freq: Once | INTRAVENOUS | Status: DC

## 2013-09-05 MED ORDER — DEXAMETHASONE SODIUM PHOSPHATE 10 MG/ML IJ SOLN: 20.0000 mg | Freq: Once | INTRAMUSCULAR | Status: DC

## 2013-09-05 MED ORDER — DIPHENHYDRAMINE HCL 50 MG/ML IJ SOLN
50.0000 mg | Freq: Once | INTRAMUSCULAR | Status: AC
  Administered 2013-09-05: 50 mg via INTRAVENOUS

## 2013-09-05 MED ORDER — SODIUM CHLORIDE 0.9 % IV SOLN
Freq: Once | INTRAVENOUS | Status: AC
  Administered 2013-09-05: 16 mg via INTRAVENOUS
  Filled 2013-09-05: qty 8

## 2013-09-05 MED ORDER — HEPARIN SOD (PORK) LOCK FLUSH 100 UNIT/ML IV SOLN
500.0000 [IU] | Freq: Once | INTRAVENOUS | Status: AC | PRN
  Administered 2013-09-05: 500 [IU]
  Filled 2013-09-05: qty 5

## 2013-09-05 MED ORDER — SODIUM CHLORIDE 0.9 % IJ SOLN
10.0000 mL | INTRAMUSCULAR | Status: DC | PRN
  Administered 2013-09-05: 10 mL

## 2013-09-05 MED ORDER — PACLITAXEL CHEMO INJECTION 300 MG/50ML
45.0000 mg/m2 | Freq: Once | INTRAVENOUS | Status: AC
  Administered 2013-09-05: 84 mg via INTRAVENOUS
  Filled 2013-09-05: qty 14

## 2013-09-05 MED ORDER — DIPHENHYDRAMINE HCL 50 MG/ML IJ SOLN
INTRAMUSCULAR | Status: AC
  Filled 2013-09-05: qty 1

## 2013-09-05 MED ORDER — FAMOTIDINE IN NACL 20-0.9 MG/50ML-% IV SOLN
INTRAVENOUS | Status: AC
  Filled 2013-09-05: qty 50

## 2013-09-05 MED ORDER — FAMOTIDINE IN NACL 20-0.9 MG/50ML-% IV SOLN
20.0000 mg | Freq: Once | INTRAVENOUS | Status: AC
Start: 2013-09-05 — End: 2013-09-05
  Administered 2013-09-05: 20 mg via INTRAVENOUS

## 2013-09-05 MED ORDER — SODIUM CHLORIDE 0.9 % IV SOLN
Freq: Once | INTRAVENOUS | Status: AC
  Administered 2013-09-05: 12:00:00 via INTRAVENOUS

## 2013-09-05 MED ORDER — SODIUM CHLORIDE 0.9 % IV SOLN
110.2000 mg | Freq: Once | INTRAVENOUS | Status: AC
  Administered 2013-09-05: 110 mg via INTRAVENOUS
  Filled 2013-09-05: qty 11

## 2013-09-05 NOTE — Progress Notes (Signed)
Tolerated treatment well.  Alert, in no distress.   Left in c/o family for transport home.

## 2013-09-05 NOTE — Progress Notes (Signed)
[facility]  OFFICE PROGRESS NOTE  [name], MD [place] [address]  DIAGNOSIS: Squamous cell carcinoma of left lung  Chronic obstructive pulmonary disease  Chief Complaint  Patient presents with  . Lung cancer on combined modality therapy    CURRENT THERAPY: Weekly carboplatin/Taxol with concurrent daily radiotherapy started on 08/07/2013  INTERVAL HISTORY: [name] 79 y.o. female returns for followup while receiving daily radiotherapy conjunction with weekly chemotherapy utilizing carboplatin and Taxol for stage III-B. 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 She is suffering with dysphagia and is using Carafate liquid to allay symptomatology. She was advised to use it on a regular basis prior to eating and also take antinauseants on a regular basis whether she feels well or not. She denies any vomiting per se. [name] denies a diarrhea, constipation, or further hemoptysis. Produces clear sputum. She denies any PND, orthopnea, palpitations, lower extremity swelling or redness, melena, hematochezia, hematuria, or peripheral paresthesias.  MEDICAL HISTORY: Past Medical History  Diagnosis Date  . Hypertension   . Hypothyroidism   . Hyperlipidemia   . Insomnia   . GERD (gastroesophageal reflux disease)     chronic gastritis  . Adrenal adenoma   . Chronic diarrhea   . Fatty liver   . Arthritis     knee  . QT prolongation     syncope  . Coronary artery disease   . Impaired fasting glucose   . Renal insufficiency     low protien diet  . Tobacco use     1/2 ppd, approx 25-50 pack years (as of 08/2012)  . Family history of heart disease   . IBS (irritable bowel syndrome)   . Peripheral arterial disease     sstatus post  infrarenal abdominal aortic tube graft placed by Dr. Victorino Dike February 2008    . Cancer     Lung  . Squamous cell carcinoma of left lung 07/29/2013    INTERIM HISTORY: has Unspecified hypothyroidism; Pruritic dermatitis; HTN (hypertension); Chronic renal insufficiency; Other and unspecified hyperlipidemia; Generalized anxiety disorder; Insomnia; Esophageal reflux; Coronary artery disease; Essential hypertension; Hyperlipidemia; Peripheral arterial disease; Anemia, iron deficiency; Hemoptysis; and Squamous cell carcinoma of left lung on her problem list.   Stage IIIB squamous cell carcinoma of the left lower lobe, 4.1 cm in size with left hilar and bilateral mediastinal adenopathy with a partial hepatic lesion and left upper lobe lesion as well, undergoing combined modality therapy at this time with carboplatin and Taxol weekly plus daily radiotherapy with treatment started on 08/07/2013  ALLERGIES:  is allergic to aleve; dilaudid; dyazide; and zithromax.  MEDICATIONS: has a current medication list which includes the following prescription(s): allopurinol, alprazolam, amlodipine, aspirin ec, benzonatate, cholecalciferol, enalapril, esomeprazole, fenofibrate, ferrous sulfate, furosemide, hydralazine, hydrocodone-acetaminophen, hydroxyzine, levothyroxine, lidocaine-prilocaine, loperamide, magnesium oxide, metoclopramide, ondansetron, paroxetine, potassium chloride sa, pravastatin, prochlorperazine, temazepam, tramadol, zolpidem, and guaifenesin, and the following Facility-Administered Medications: sodium chloride, CARBOplatin (PARAPLATIN) 110 mg in sodium chloride 0.9 % 100 mL chemo infusion, heparin lock flush, ondansetron (ZOFRAN) 16 mg, dexamethasone (DECADRON) 20 mg in sodium chloride 0.9 % 50 mL IVPB, PACLitaxel (TAXOL) 84 mg in dextrose 5 % 250 mL chemo infusion (</= 73m/m2), and sodium chloride.  SURGICAL HISTORY:  Past Surgical History  Procedure Laterality Date  . Abdominal hysterectomy    . Thyroidectomy, partial    .  Colonoscopy  5/04    normal  . Abdominal aortic  aneurysm repair  07/2006    D. J.D. Kellie Simmering  . Cataract extraction Bilateral 2010  . Cardiac catheterization  02/2010    mod CAD in L-dominant system with normal LV function  . Colon resection  2005    1/2 colon removed  . Transthoracic echocardiogram  02/2010    EF 55-60%; mild conc LVH, normal systolic function; mildly calcified AV annulus  . Colonoscopy with esophagogastroduodenoscopy (egd) N/A 03/19/2013    Procedure: COLONOSCOPY WITH ESOPHAGOGASTRODUODENOSCOPY (EGD);  Surgeon: Rogene Houston, MD;  Location: [facility];  Service: Endoscopy;  Laterality: N/A;  145  . Dg biopsy lung Left Jan 2015    FAMILY HISTORY: family history includes Diabetes in her mother; Heart attack in her mother; Hyperlipidemia in her sister; Hypertension in her mother; Kidney disease in her brother.  SOCIAL HISTORY:  reports that she has been smoking Cigarettes.  She has a 29.5 pack-year smoking history. She has never used smokeless tobacco. She reports that she does not drink alcohol or use illicit drugs.  REVIEW OF SYSTEMS:  Other than that discussed above is noncontributory.  PHYSICAL EXAMINATION: ECOG PERFORMANCE STATUS: 1 - Symptomatic but completely ambulatory  Blood pressure 148/69, pulse 100, temperature 99.4 F (37.4 C), temperature source Oral, resp. rate 18, weight 159 lb 6.4 oz (72.303 kg).  GENERAL:alert, no distress and comfortable SKIN: skin color, texture, turgor are normal, no rashes or significant lesions EYES: PERLA; Conjunctiva are pink and non-injected, sclera clear OROPHARYNX:no exudate, no erythema on lips, buccal mucosa, or tongue. NECK: supple, thyroid normal size, non-tender, without nodularity. No masses CHEST: Increased AP diameter with no breast masses. LYMPH:  no palpable lymphadenopathy in the cervical, axillary or inguinal LUNGS: clear to auscultation and percussion with normal breathing effort. No dullness to percussion. HEART: regular rate & rhythm and no  murmurs. ABDOMEN:abdomen soft, non-tender and normal bowel sounds MUSCULOSKELETAL:no cyanosis of digits and no clubbing. Range of motion normal.  NEURO: alert & oriented x 3 with fluent speech, no focal motor/sensory deficits   LABORATORY DATA: Infusion on 09/05/2013  Component Date Value Ref Range Status  . WBC 09/05/2013 2.3* 4.0 - 10.5 K/uL Final  . RBC 09/05/2013 2.73* 3.87 - 5.11 MIL/uL Final  . Hemoglobin 09/05/2013 9.2* 12.0 - 15.0 g/dL Final  . HCT 09/05/2013 26.8* 36.0 - 46.0 % Final  . MCV 09/05/2013 98.2  78.0 - 100.0 fL Final  . MCH 09/05/2013 33.7  26.0 - 34.0 pg Final  . MCHC 09/05/2013 34.3  30.0 - 36.0 g/dL Final  . RDW 09/05/2013 14.9  11.5 - 15.5 % Final  . Platelets 09/05/2013 178  150 - 400 K/uL Final  . Neutrophils Relative % 09/05/2013 73  43 - 77 % Final  . Neutro Abs 09/05/2013 1.7  1.7 - 7.7 K/uL Final  . Lymphocytes Relative 09/05/2013 12  12 - 46 % Final  . Lymphs Abs 09/05/2013 0.3* 0.7 - 4.0 K/uL Final  . Monocytes Relative 09/05/2013 13* 3 - 12 % Final  . Monocytes Absolute 09/05/2013 0.3  0.1 - 1.0 K/uL Final  . Eosinophils Relative 09/05/2013 1  0 - 5 % Final  . Eosinophils Absolute 09/05/2013 0.0  0.0 - 0.7 K/uL Final  . Basophils Relative 09/05/2013 0  0 - 1 % Final  . Basophils Absolute 09/05/2013 0.0  0.0 - 0.1 K/uL Final  . Sodium 09/05/2013 140  137 - 147 mEq/L Final  . Potassium  09/05/2013 4.2  3.7 - 5.3 mEq/L Final  . Chloride 09/05/2013 102  96 - 112 mEq/L Final  . CO2 09/05/2013 27  19 - 32 mEq/L Final  . Glucose, Bld 09/05/2013 116* 70 - 99 mg/dL Final  . BUN 09/05/2013 31* 6 - 23 mg/dL Final  . Creatinine, Ser 09/05/2013 2.13* 0.50 - 1.10 mg/dL Final  . Calcium 09/05/2013 10.1  8.4 - 10.5 mg/dL Final  . GFR calc non Af Amer 09/05/2013 21* >90 mL/min Final  . GFR calc Af Amer 09/05/2013 25* >90 mL/min Final   Comment: (NOTE)                          The eGFR has been calculated using the CKD EPI equation.                          This  calculation has not been validated in all clinical situations.                          eGFR's persistently <90 mL/min signify possible Chronic Kidney                          Disease.  Infusion on 08/29/2013  Component Date Value Ref Range Status  . WBC 08/29/2013 2.0* 4.0 - 10.5 K/uL Final  . RBC 08/29/2013 2.68* 3.87 - 5.11 MIL/uL Final  . Hemoglobin 08/29/2013 9.1* 12.0 - 15.0 g/dL Final  . HCT 08/29/2013 26.6* 36.0 - 46.0 % Final  . MCV 08/29/2013 99.3  78.0 - 100.0 fL Final  . MCH 08/29/2013 34.0  26.0 - 34.0 pg Final  . MCHC 08/29/2013 34.2  30.0 - 36.0 g/dL Final  . RDW 08/29/2013 14.5  11.5 - 15.5 % Final  . Platelets 08/29/2013 202  150 - 400 K/uL Final  . Neutrophils Relative % 08/29/2013 68  43 - 77 % Final  . Neutro Abs 08/29/2013 1.4* 1.7 - 7.7 K/uL Final  . Lymphocytes Relative 08/29/2013 16  12 - 46 % Final  . Lymphs Abs 08/29/2013 0.3* 0.7 - 4.0 K/uL Final  . Monocytes Relative 08/29/2013 12  3 - 12 % Final  . Monocytes Absolute 08/29/2013 0.2  0.1 - 1.0 K/uL Final  . Eosinophils Relative 08/29/2013 3  0 - 5 % Final  . Eosinophils Absolute 08/29/2013 0.1  0.0 - 0.7 K/uL Final  . Basophils Relative 08/29/2013 2* 0 - 1 % Final  . Basophils Absolute 08/29/2013 0.0  0.0 - 0.1 K/uL Final  . Sodium 08/29/2013 138  137 - 147 mEq/L Final  . Potassium 08/29/2013 4.4  3.7 - 5.3 mEq/L Final  . Chloride 08/29/2013 104  96 - 112 mEq/L Final  . CO2 08/29/2013 23  19 - 32 mEq/L Final  . Glucose, Bld 08/29/2013 97  70 - 99 mg/dL Final  . BUN 08/29/2013 25* 6 - 23 mg/dL Final  . Creatinine, Ser 08/29/2013 2.17* 0.50 - 1.10 mg/dL Final  . Calcium 08/29/2013 10.0  8.4 - 10.5 mg/dL Final  . GFR calc non Af Amer 08/29/2013 21* >90 mL/min Final  . GFR calc Af Amer 08/29/2013 24* >90 mL/min Final   Comment: (NOTE)                          The eGFR has been calculated using  the CKD EPI equation.                          This calculation has not been validated in all clinical situations.                            eGFR's persistently <90 mL/min signify possible Chronic Kidney                          Disease.  Infusion on 08/22/2013  Component Date Value Ref Range Status  . WBC 08/22/2013 2.7* 4.0 - 10.5 K/uL Final  . RBC 08/22/2013 2.85* 3.87 - 5.11 MIL/uL Final  . Hemoglobin 08/22/2013 9.2* 12.0 - 15.0 g/dL Final  . HCT 08/22/2013 28.2* 36.0 - 46.0 % Final  . MCV 08/22/2013 98.9  78.0 - 100.0 fL Final  . MCH 08/22/2013 32.3  26.0 - 34.0 pg Final  . MCHC 08/22/2013 32.6  30.0 - 36.0 g/dL Final  . RDW 08/22/2013 14.0  11.5 - 15.5 % Final  . Platelets 08/22/2013 289  150 - 400 K/uL Final  . Neutrophils Relative % 08/22/2013 70  43 - 77 % Final  . Neutro Abs 08/22/2013 1.9  1.7 - 7.7 K/uL Final  . Lymphocytes Relative 08/22/2013 16  12 - 46 % Final  . Lymphs Abs 08/22/2013 0.4* 0.7 - 4.0 K/uL Final  . Monocytes Relative 08/22/2013 10  3 - 12 % Final  . Monocytes Absolute 08/22/2013 0.3  0.1 - 1.0 K/uL Final  . Eosinophils Relative 08/22/2013 3  0 - 5 % Final  . Eosinophils Absolute 08/22/2013 0.1  0.0 - 0.7 K/uL Final  . Basophils Relative 08/22/2013 2* 0 - 1 % Final  . Basophils Absolute 08/22/2013 0.0  0.0 - 0.1 K/uL Final  . Sodium 08/22/2013 137  137 - 147 mEq/L Final  . Potassium 08/22/2013 4.2  3.7 - 5.3 mEq/L Final  . Chloride 08/22/2013 101  96 - 112 mEq/L Final  . CO2 08/22/2013 25  19 - 32 mEq/L Final  . Glucose, Bld 08/22/2013 131* 70 - 99 mg/dL Final  . BUN 08/22/2013 16  6 - 23 mg/dL Final  . Creatinine, Ser 08/22/2013 1.81* 0.50 - 1.10 mg/dL Final  . Calcium 08/22/2013 9.5  8.4 - 10.5 mg/dL Final  . GFR calc non Af Amer 08/22/2013 26* >90 mL/min Final  . GFR calc Af Amer 08/22/2013 30* >90 mL/min Final   Comment: (NOTE)                          The eGFR has been calculated using the CKD EPI equation.                          This calculation has not been validated in all clinical situations.                          eGFR's persistently <90 mL/min  signify possible Chronic Kidney                          Disease.  Infusion on 08/15/2013  Component Date Value Ref Range Status  . WBC 08/15/2013 4.3  4.0 - 10.5 K/uL Final  . RBC 08/15/2013 2.80* 3.87 - 5.11  MIL/uL Final  . Hemoglobin 08/15/2013 9.4* 12.0 - 15.0 g/dL Final  . HCT 08/15/2013 27.6* 36.0 - 46.0 % Final  . MCV 08/15/2013 98.6  78.0 - 100.0 fL Final  . MCH 08/15/2013 33.6  26.0 - 34.0 pg Final  . MCHC 08/15/2013 34.1  30.0 - 36.0 g/dL Final  . RDW 08/15/2013 13.7  11.5 - 15.5 % Final  . Platelets 08/15/2013 325  150 - 400 K/uL Final  . Neutrophils Relative % 08/15/2013 75  43 - 77 % Final  . Neutro Abs 08/15/2013 3.2  1.7 - 7.7 K/uL Final  . Lymphocytes Relative 08/15/2013 13  12 - 46 % Final  . Lymphs Abs 08/15/2013 0.6* 0.7 - 4.0 K/uL Final  . Monocytes Relative 08/15/2013 8  3 - 12 % Final  . Monocytes Absolute 08/15/2013 0.3  0.1 - 1.0 K/uL Final  . Eosinophils Relative 08/15/2013 4  0 - 5 % Final  . Eosinophils Absolute 08/15/2013 0.2  0.0 - 0.7 K/uL Final  . Basophils Relative 08/15/2013 1  0 - 1 % Final  . Basophils Absolute 08/15/2013 0.0  0.0 - 0.1 K/uL Final  . Sodium 08/15/2013 139  137 - 147 mEq/L Final  . Potassium 08/15/2013 3.3* 3.7 - 5.3 mEq/L Final  . Chloride 08/15/2013 99  96 - 112 mEq/L Final  . CO2 08/15/2013 30  19 - 32 mEq/L Final  . Glucose, Bld 08/15/2013 87  70 - 99 mg/dL Final  . BUN 08/15/2013 17  6 - 23 mg/dL Final  . Creatinine, Ser 08/15/2013 1.76* 0.50 - 1.10 mg/dL Final  . Calcium 08/15/2013 9.3  8.4 - 10.5 mg/dL Final  . GFR calc non Af Amer 08/15/2013 27* >90 mL/min Final  . GFR calc Af Amer 08/15/2013 31* >90 mL/min Final   Comment: (NOTE)                          The eGFR has been calculated using the CKD EPI equation.                          This calculation has not been validated in all clinical situations.                          eGFR's persistently <90 mL/min signify possible Chronic Kidney                          Disease.   Infusion on 08/08/2013  Component Date Value Ref Range Status  . Sodium 08/08/2013 140  137 - 147 mEq/L Final  . Potassium 08/08/2013 3.2* 3.7 - 5.3 mEq/L Final  . Chloride 08/08/2013 101  96 - 112 mEq/L Final  . CO2 08/08/2013 28  19 - 32 mEq/L Final  . Glucose, Bld 08/08/2013 93  70 - 99 mg/dL Final  . BUN 08/08/2013 14  6 - 23 mg/dL Final  . Creatinine, Ser 08/08/2013 1.88* 0.50 - 1.10 mg/dL Final  . Calcium 08/08/2013 9.3  8.4 - 10.5 mg/dL Final  . Total Protein 08/08/2013 7.1  6.0 - 8.3 g/dL Final  . Albumin 08/08/2013 3.6  3.5 - 5.2 g/dL Final  . AST 08/08/2013 19  0 - 37 U/L Final  . ALT 08/08/2013 8  0 - 35 U/L Final  . Alkaline Phosphatase 08/08/2013 56  39 - 117 U/L Final  .  Total Bilirubin 08/08/2013 0.4  0.3 - 1.2 mg/dL Final  . GFR calc non Af Amer 08/08/2013 25* >90 mL/min Final  . GFR calc Af Amer 08/08/2013 29* >90 mL/min Final   Comment: (NOTE)                          The eGFR has been calculated using the CKD EPI equation.                          This calculation has not been validated in all clinical situations.                          eGFR's persistently <90 mL/min signify possible Chronic Kidney                          Disease.  . WBC 08/08/2013 4.9  4.0 - 10.5 K/uL Final  . RBC 08/08/2013 3.18* 3.87 - 5.11 MIL/uL Final  . Hemoglobin 08/08/2013 10.6* 12.0 - 15.0 g/dL Final  . HCT 08/08/2013 31.7* 36.0 - 46.0 % Final  . MCV 08/08/2013 99.7  78.0 - 100.0 fL Final  . MCH 08/08/2013 33.3  26.0 - 34.0 pg Final  . MCHC 08/08/2013 33.4  30.0 - 36.0 g/dL Final  . RDW 08/08/2013 14.0  11.5 - 15.5 % Final  . Platelets 08/08/2013 253  150 - 400 K/uL Final  . Neutrophils Relative % 08/08/2013 65  43 - 77 % Final  . Neutro Abs 08/08/2013 3.2  1.7 - 7.7 K/uL Final  . Lymphocytes Relative 08/08/2013 19  12 - 46 % Final  . Lymphs Abs 08/08/2013 0.9  0.7 - 4.0 K/uL Final  . Monocytes Relative 08/08/2013 11  3 - 12 % Final  . Monocytes Absolute 08/08/2013 0.6  0.1 - 1.0  K/uL Final  . Eosinophils Relative 08/08/2013 4  0 - 5 % Final  . Eosinophils Absolute 08/08/2013 0.2  0.0 - 0.7 K/uL Final  . Basophils Relative 08/08/2013 1  0 - 1 % Final  . Basophils Absolute 08/08/2013 0.0  0.0 - 0.1 K/uL Final    PATHOLOGY: No new pathology. Unfortunately [facility] analysis could not be done because of an inadequate tissue sample  Urinalysis    Component Value Date/Time   COLORURINE YELLOW 03/20/2010 [place] 03/20/2010 1405   LABSPEC 1.015 03/20/2010 1405   PHURINE 6.0 03/20/2010 [place] 03/20/2010 1405   HGBUR SMALL* 03/20/2010 1405   BILIRUBINUR NEGATIVE 03/20/2010 [place] 03/20/2010 [place] 03/20/2010 1405   UROBILINOGEN 0.2 03/20/2010 1405   NITRITE NEGATIVE 03/20/2010 1405   LEUKOCYTESUR NEGATIVE 03/20/2010 1405    RADIOGRAPHIC STUDIES: Mr Jeri Cos Wo Contrast  09/02/2013   CLINICAL DATA:  Squamous cell carcinoma of lung. Headache. Rule out metastatic disease  EXAM: MRI HEAD WITHOUT AND WITH CONTRAST  TECHNIQUE: Multiplanar, multiecho pulse sequences of the brain and surrounding structures were obtained without and with intravenous contrast.  CONTRAST:  63m MULTIHANCE GADOBENATE DIMEGLUMINE 529 MG/ML IV SOLN  COMPARISON:  CT head 03/20/2010  FINDINGS: Ventricle size is normal.  Cerebral volume is normal for age.  Small hyperintensities in the cerebral white matter bilaterally and in the right midbrain, most consistent with chronic microvascular ischemia. No acute infarct.  Negative for hemorrhage or mass.  No  edema is present  Postcontrast imaging reveals normal enhancement. No enhancing metastatic deposits are identified.  Mucous retention cyst left maxillary sinus. Remaining sinuses are clear.  IMPRESSION: Negative for metastatic disease.  Chronic microvascular ischemic change.  No acute infarct.   Electronically Signed   By: Franchot Gallo M.D.   On: 08/27/2013 14:59    ASSESSMENT:  #1.Stage  IIIB squamous cell carcinoma with a 4.1 cm left lower lobe lesion, left hilar and bilateral mediastinal lymphadenopathy (questionable hepatic lesion and left upper lobe of lung lesion). Undergoing chemoradiation with weekly Carboplatin/Paclitaxel beginning on 08/07/2013.  2. Chronic obstructive pulmonary disease  3. Gastroesophageal reflux disease, on treatment.  4. Hypertension, controlled.  5. Hypothyroidism, on treatment.  6. Anemia of chronic disease and chronic blood loss. 7. Frontal headache secondary to sinus congestion. 8. Esophagitis, controlled with Carafate and metoclopramide for nausea. 9. Insomnia, improved on temazepam. 10. Leukopenia secondary to combined modality therapy.    PLAN:  #1. Carboplatin/Taxol cycle #5 today of a planned 6. #2. Continue daily radiotherapy #3. Followup in one week for last cycle of chemotherapy. Plan is to wait until 2 or 3 weeks after radiation treatment is completed to repeat CT scans and then decide whether or not additional treatment is necessary.   All questions were answered. The patient knows to call the clinic with any problems, questions or concerns. We can certainly see the patient much sooner if necessary.   I spent 25 minutes counseling the patient face to face. The total time spent in the appointment was 30 minutes.    Doroteo Bradford, MD 09/05/2013 1:36 PM

## 2013-09-05 NOTE — Patient Instructions (Signed)
..  [facility] Discharge Instructions  RECOMMENDATIONS MADE BY THE CONSULTANT AND ANY TEST RESULTS WILL BE SENT TO YOUR REFERRING PHYSICIAN.  EXAM FINDINGS BY THE PHYSICIAN TODAY AND SIGNS OR SYMPTOMS TO REPORT TO CLINIC OR PRIMARY PHYSICIAN: Exam and findings as discussed by Dr. Barnet Glasgow.  MEDICATIONS PRESCRIBED:  Continue to use reglan and compazine as ordered for nausea. You can additionally use it before you eat 2-3 times daily after chemo.  INSTRUCTIONS/FOLLOW-UP: One week to see Kirby Crigler PA One more treatment with carbo taxol next week for a total of 6 treatments  Thank you for choosing [facility] to provide your oncology and hematology care.  To afford each patient quality time with our providers, please arrive at least 15 minutes before your scheduled appointment time.  With your help, our goal is to use those 15 minutes to complete the necessary work-up to ensure our physicians have the information they need to help with your evaluation and healthcare recommendations.    Effective January 1st, 2014, we ask that you re-schedule your appointment with our physicians should you arrive 10 or more minutes late for your appointment.  We strive to give you quality time with our providers, and arriving late affects you and other patients whose appointments are after yours.    Again, thank you for choosing [facility].  Our hope is that these requests will decrease the amount of time that you wait before being seen by our physicians.       _____________________________________________________________  Should you have questions after your visit to [facility], please contact our office at [phone] between the hours of 8:30 a.m. and 5:00 p.m.  Voicemails left after 4:30 p.m. will not be returned until the following business day.  For prescription refill requests, have your pharmacy contact our office with your prescription refill  request.

## 2013-09-08 ENCOUNTER — Telehealth (HOSPITAL_COMMUNITY): Payer: Self-pay | Admitting: Oncology

## 2013-09-08 DIAGNOSIS — R1310 Dysphagia, unspecified: Secondary | ICD-10-CM | POA: Diagnosis not present

## 2013-09-08 DIAGNOSIS — Z510 Encounter for antineoplastic radiation therapy: Secondary | ICD-10-CM | POA: Diagnosis not present

## 2013-09-08 DIAGNOSIS — R05 Cough: Secondary | ICD-10-CM | POA: Diagnosis not present

## 2013-09-08 DIAGNOSIS — C3490 Malignant neoplasm of unspecified part of unspecified bronchus or lung: Secondary | ICD-10-CM | POA: Diagnosis not present

## 2013-09-08 DIAGNOSIS — R059 Cough, unspecified: Secondary | ICD-10-CM | POA: Diagnosis not present

## 2013-09-08 DIAGNOSIS — Z79899 Other long term (current) drug therapy: Secondary | ICD-10-CM | POA: Diagnosis not present

## 2013-09-08 NOTE — Telephone Encounter (Signed)
Patient had radiation today and is back home.  Her cough is worse today without any sputum production.  She was prescribed liquid cough syrup by Rad Onc (I suspect Hycodan).   The patient's daughter reports a cough that occurs in spells causing heaving without emesis and no blood. Subsequently, she is SOB but her breath come back to her after a few moments.      I recommended that she get the Hycodan filled today.  In the interim, she was encouraged to take Hydrocodone now to help with coughing.   We will see as scheduled on 3/20.  If she changes clinically, I recommended a return telephone call or report to ED.  [name]

## 2013-09-09 ENCOUNTER — Other Ambulatory Visit: Payer: Self-pay | Admitting: Family Medicine

## 2013-09-09 DIAGNOSIS — C3490 Malignant neoplasm of unspecified part of unspecified bronchus or lung: Secondary | ICD-10-CM | POA: Diagnosis not present

## 2013-09-09 DIAGNOSIS — Z79899 Other long term (current) drug therapy: Secondary | ICD-10-CM | POA: Diagnosis not present

## 2013-09-09 DIAGNOSIS — Z510 Encounter for antineoplastic radiation therapy: Secondary | ICD-10-CM | POA: Diagnosis not present

## 2013-09-09 DIAGNOSIS — R05 Cough: Secondary | ICD-10-CM | POA: Diagnosis not present

## 2013-09-09 DIAGNOSIS — R1310 Dysphagia, unspecified: Secondary | ICD-10-CM | POA: Diagnosis not present

## 2013-09-09 DIAGNOSIS — R059 Cough, unspecified: Secondary | ICD-10-CM | POA: Diagnosis not present

## 2013-09-10 DIAGNOSIS — Z510 Encounter for antineoplastic radiation therapy: Secondary | ICD-10-CM | POA: Diagnosis not present

## 2013-09-10 DIAGNOSIS — R05 Cough: Secondary | ICD-10-CM | POA: Diagnosis not present

## 2013-09-10 DIAGNOSIS — Z79899 Other long term (current) drug therapy: Secondary | ICD-10-CM | POA: Diagnosis not present

## 2013-09-10 DIAGNOSIS — R059 Cough, unspecified: Secondary | ICD-10-CM | POA: Diagnosis not present

## 2013-09-10 DIAGNOSIS — R1310 Dysphagia, unspecified: Secondary | ICD-10-CM | POA: Diagnosis not present

## 2013-09-10 DIAGNOSIS — C3490 Malignant neoplasm of unspecified part of unspecified bronchus or lung: Secondary | ICD-10-CM | POA: Diagnosis not present

## 2013-09-11 ENCOUNTER — Inpatient Hospital Stay (HOSPITAL_COMMUNITY): Payer: Medicare Other

## 2013-09-11 DIAGNOSIS — R05 Cough: Secondary | ICD-10-CM | POA: Diagnosis not present

## 2013-09-11 DIAGNOSIS — R1310 Dysphagia, unspecified: Secondary | ICD-10-CM | POA: Diagnosis not present

## 2013-09-11 DIAGNOSIS — Z510 Encounter for antineoplastic radiation therapy: Secondary | ICD-10-CM | POA: Diagnosis not present

## 2013-09-11 DIAGNOSIS — R059 Cough, unspecified: Secondary | ICD-10-CM | POA: Diagnosis not present

## 2013-09-11 DIAGNOSIS — Z79899 Other long term (current) drug therapy: Secondary | ICD-10-CM | POA: Diagnosis not present

## 2013-09-11 DIAGNOSIS — C3490 Malignant neoplasm of unspecified part of unspecified bronchus or lung: Secondary | ICD-10-CM | POA: Diagnosis not present

## 2013-09-11 NOTE — Progress Notes (Signed)
[name], MD [place] [address]  Squamous cell carcinoma of left lung - Plan: CT Abdomen Pelvis W Contrast, CT Chest W Contrast, CANCELED: CT Chest Wo Contrast  CURRENT THERAPY:Weekly carboplatin/Taxol with concurrent daily radiotherapy started on 08/07/2013  INTERVAL HISTORY: [name] 79 y.o. female returns for  regular  visit for followup of Stage IIIB squamous cell carcinoma with a 4.1 cm left lower lobe lesion, left hilar and bilateral mediastinal lymphadenopathy (questionable hepatic lesion and left upper lobe of lung lesion). Undergoing chemoradiation with weekly Carboplatin/Paclitaxel beginning on 08/07/2013.    Squamous cell carcinoma of left lung   06/30/2013 Imaging CT of chest demonstrates 4 cm left lower lobe lesion with other concerning findings for malignancy   07/09/2013 Imaging PET- 4.1 cm left lower oobe lesion with left hilar and bilateral mediastinal lymphadeopathy.  Hepatic lesion not confidently identified.  1.8 cm nodule in left upper lobe, cannot exclude low grade tumor.   07/16/2013 Initial Diagnosis Squamous cell carcinoma of left lung via CT-guided biopsy by IR   07/29/2013 Surgery IR placement of port-a-cath in preparation for concomittant chemoradiation    08/08/2013 - 09/12/2013 Chemotherapy Paclitaxel/Carboplatin weekly x 6    I personally reviewed and went over laboratory results with the patient.  The results are noted within this dictation.  She is completing her course of concomitant chemotherapy with radiation today.  We will restage her with CT scans in 4 weeks.  The order has been placed.    She complains of the following that are from radiation: 1. Chest "burning"- this is secondary to radiation therapy.  She is on liquid pain medication she reports.  This is helping. 2. Increased saliva production-  She is seen spitting clear saliva.  This too is secondary to irritation from radiation therapy.   Additionally, she  reports that she had an argument with her daughter this AM before I saw her in the chemo-room today.  She notes that her family makes her do things when she would rather be left alone.  I provided her some education regarding the role of her family in her care.  I understand that she is irritable because she does not feel well, but this her family's way of showing they care for her and want the best for her.  Sometimes, we need to do things we don't want to do in order to get better.  No on will hold any ill-feelings against [name] because we can understand she does not feel well while she undergoes this intense chemotherapy regimen.  [name] is thankful for our discussion today.  Also, she notes some minimal toe numbness, mostly on right foot.  She denies any other peripheral neuropathy symptoms elsewhere.  Patient is given education regarding Paclitaxel-induced peripheral neuropathy.  She otherwise denies any complaints and ROS questioning is negative.   Past Medical History  Diagnosis Date  . Hypertension   . Hypothyroidism   . Hyperlipidemia   . Insomnia   . GERD (gastroesophageal reflux disease)     chronic gastritis  . Adrenal adenoma   . Chronic diarrhea   . Fatty liver   . Arthritis     knee  . QT prolongation     syncope  . Coronary artery disease   . Impaired fasting glucose   . Renal insufficiency     low protien diet  . Tobacco use     1/2 ppd, approx 25-50 pack years (as of 08/2012)  .  Family history of heart disease   . IBS (irritable bowel syndrome)   . Peripheral arterial disease     sstatus post  infrarenal abdominal aortic tube graft placed by Dr. Victorino Dike February 2008  . Cancer     Lung  . Squamous cell carcinoma of left lung 07/29/2013    has Unspecified hypothyroidism; Pruritic dermatitis; HTN (hypertension); Chronic renal insufficiency; Other and unspecified hyperlipidemia; Generalized anxiety disorder; Insomnia; Esophageal reflux; Coronary artery disease; Essential  hypertension; Hyperlipidemia; Peripheral arterial disease; Anemia, iron deficiency; Hemoptysis; and Squamous cell carcinoma of left lung on her problem list.     is allergic to aleve; dilaudid; dyazide; and zithromax.  [name] does not currently have medications on file.  Past Surgical History  Procedure Laterality Date  . Abdominal hysterectomy    . Thyroidectomy, partial    . Colonoscopy  5/04    normal  . Abdominal aortic aneurysm repair  07/2006    D. J.D. Kellie Simmering  . Cataract extraction Bilateral 2010  . Cardiac catheterization  02/2010    mod CAD in L-dominant system with normal LV function  . Colon resection  2005    1/2 colon removed  . Transthoracic echocardiogram  02/2010    EF 55-60%; mild conc LVH, normal systolic function; mildly calcified AV annulus  . Colonoscopy with esophagogastroduodenoscopy (egd) N/A 03/19/2013    Procedure: COLONOSCOPY WITH ESOPHAGOGASTRODUODENOSCOPY (EGD);  Surgeon: Rogene Houston, MD;  Location: [facility];  Service: Endoscopy;  Laterality: N/A;  145  . Dg biopsy lung Left Jan 2015    Denies any headaches, dizziness, double vision, fevers, chills, night sweats, nausea, vomiting, diarrhea, constipation, chest pain, heart palpitations, shortness of breath, blood in stool, black tarry stool, urinary pain, urinary burning, urinary frequency, hematuria.   PHYSICAL EXAMINATION  ECOG PERFORMANCE STATUS: 2 - Symptomatic, <50% confined to bed  There were no vitals filed for this visit.  GENERAL:alert, well nourished, well developed, cooperative and not feeling well, seen spitting clear saliva. SKIN: skin color, texture, turgor are normal, no rashes or significant lesions HEAD: Normocephalic, No masses, lesions, tenderness or abnormalities EYES: normal, PERRLA, EOMI, Conjunctiva are pink and non-injected EARS: External ears normal OROPHARYNX:mucous membranes are moist  NECK: supple, trachea midline LYMPH:  not examined BREAST:not  examined LUNGS: clear to auscultation, decreased breath sounds throughout HEART: regular rate & rhythm, no murmurs and no gallops ABDOMEN:abdomen soft and normal bowel sounds BACK: Back symmetric, no curvature. EXTREMITIES:no skin discoloration, no cyanosis  NEURO: alert & oriented x 3 with fluent speech, no focal motor/sensory deficits    LABORATORY DATA: CBC    Component Value Date/Time   WBC 2.3* 09/05/2013 1051   RBC 2.73* 09/05/2013 1051   HGB 9.2* 09/05/2013 1051   HCT 26.8* 09/05/2013 1051   PLT 178 09/05/2013 1051   MCV 98.2 09/05/2013 1051   MCH 33.7 09/05/2013 1051   MCHC 34.3 09/05/2013 1051   RDW 14.9 09/05/2013 1051   LYMPHSABS 0.3* 09/05/2013 1051   MONOABS 0.3 09/05/2013 1051   EOSABS 0.0 09/05/2013 1051   BASOSABS 0.0 09/05/2013 1051      Chemistry      Component Value Date/Time   NA 140 09/05/2013 1051   K 4.2 09/05/2013 1051   CL 102 09/05/2013 1051   CO2 27 09/05/2013 1051   BUN 31* 09/05/2013 1051   CREATININE 2.13* 09/05/2013 1051   CREATININE 2.03* 05/13/2013 0941      Component Value Date/Time   CALCIUM 10.1 09/05/2013 1051  ALKPHOS 56 08/08/2013 1015   AST 19 08/08/2013 1015   ALT 8 08/08/2013 1015   BILITOT 0.4 08/08/2013 1015        ASSESSMENT:  1. Stage IIIB squamous cell carcinoma with a 4.1 cm left lower lobe lesion, left hilar and bilateral mediastinal lymphadenopathy (questionable hepatic lesion and left upper lobe of lung lesion). Undergoing chemoradiation with weekly Carboplatin/Paclitaxel beginning on 08/07/2013. 2. Chronic obstructive pulmonary disease  3. Gastroesophageal reflux disease, on treatment.  4. Hypertension, controlled.  5. Hypothyroidism, on treatment.  6. Anemia of chronic disease and chronic blood loss.  7. Frontal headache secondary to sinus congestion.  8. Esophagitis, controlled with Carafate and metoclopramide for nausea.  9. Insomnia, improved on temazepam.  10. Leukopenia secondary to combined modality therapy. 11. Grade 1  peripheral neuropathy- Paclitaxel-induced.  Patient Active Problem List   Diagnosis Date Noted  . Squamous cell carcinoma of left lung 07/29/2013  . Hemoptysis 06/20/2013  . Anemia, iron deficiency 03/03/2013  . Coronary artery disease 02/28/2013  . Essential hypertension 02/28/2013  . Hyperlipidemia 02/28/2013  . Peripheral arterial disease 02/28/2013  . Other and unspecified hyperlipidemia 12/10/2012  . Generalized anxiety disorder 12/10/2012  . Insomnia 12/10/2012  . Esophageal reflux 12/10/2012  . Chronic renal insufficiency 11/07/2012  . Unspecified hypothyroidism 09/10/2012  . Pruritic dermatitis 09/10/2012  . HTN (hypertension) 09/10/2012    PLAN:  1. I personally reviewed and went over laboratory results with the patient.  The results are noted within this dictation. 2. Complete chemotherapy today (Cycle #6) 3. Restaging CT CAP with contrast in 4 weeks. 4. Complete radiation as scheduled on 09/23/2013 5. Continue with liquid pain medication 6. Continue with Magic Mouthwash, swish and swallow 7. Patient education regarding radiation toxicities/side effects 8. Patient education regarding Paclitaxel-induced peripheral neuropathy 9. Return in 4 weeks for follow-up   THERAPY PLAN:  She will complete chemotherapy today and she notes 7 more radiation treatments (meaning she will finish radiation on 3/31).  She will undergo restaging scans in 4 weeks and re-evaluation for future treatment intervention.  All questions were answered. The patient knows to call the clinic with any problems, questions or concerns. We can certainly see the patient much sooner if necessary.  Patient and plan discussed with Dr. Farrel Gobble and he is in agreement with the aforementioned.   Farmer Mccahill 09/12/2013

## 2013-09-12 ENCOUNTER — Encounter (HOSPITAL_COMMUNITY): Payer: Self-pay | Admitting: Oncology

## 2013-09-12 ENCOUNTER — Encounter (HOSPITAL_BASED_OUTPATIENT_CLINIC_OR_DEPARTMENT_OTHER): Payer: Medicare Other

## 2013-09-12 ENCOUNTER — Encounter (HOSPITAL_BASED_OUTPATIENT_CLINIC_OR_DEPARTMENT_OTHER): Payer: Medicare Other | Admitting: Oncology

## 2013-09-12 ENCOUNTER — Other Ambulatory Visit (HOSPITAL_COMMUNITY): Payer: Self-pay | Admitting: Hematology and Oncology

## 2013-09-12 VITALS — BP 125/64 | HR 94 | Temp 98.4°F | Resp 16

## 2013-09-12 DIAGNOSIS — R1310 Dysphagia, unspecified: Secondary | ICD-10-CM | POA: Diagnosis not present

## 2013-09-12 DIAGNOSIS — R222 Localized swelling, mass and lump, trunk: Secondary | ICD-10-CM | POA: Diagnosis not present

## 2013-09-12 DIAGNOSIS — R05 Cough: Secondary | ICD-10-CM | POA: Diagnosis not present

## 2013-09-12 DIAGNOSIS — Z5111 Encounter for antineoplastic chemotherapy: Secondary | ICD-10-CM

## 2013-09-12 DIAGNOSIS — C3492 Malignant neoplasm of unspecified part of left bronchus or lung: Secondary | ICD-10-CM

## 2013-09-12 DIAGNOSIS — Z79899 Other long term (current) drug therapy: Secondary | ICD-10-CM | POA: Diagnosis not present

## 2013-09-12 DIAGNOSIS — C3430 Malignant neoplasm of lower lobe, unspecified bronchus or lung: Secondary | ICD-10-CM

## 2013-09-12 DIAGNOSIS — Z510 Encounter for antineoplastic radiation therapy: Secondary | ICD-10-CM | POA: Diagnosis not present

## 2013-09-12 DIAGNOSIS — C3490 Malignant neoplasm of unspecified part of unspecified bronchus or lung: Secondary | ICD-10-CM | POA: Diagnosis not present

## 2013-09-12 DIAGNOSIS — G620 Drug-induced polyneuropathy: Secondary | ICD-10-CM | POA: Diagnosis not present

## 2013-09-12 DIAGNOSIS — R059 Cough, unspecified: Secondary | ICD-10-CM | POA: Diagnosis not present

## 2013-09-12 DIAGNOSIS — R918 Other nonspecific abnormal finding of lung field: Secondary | ICD-10-CM

## 2013-09-12 LAB — BASIC METABOLIC PANEL
BUN: 32 mg/dL — ABNORMAL HIGH (ref 6–23)
CO2: 24 meq/L (ref 19–32)
CREATININE: 2.42 mg/dL — AB (ref 0.50–1.10)
Calcium: 10 mg/dL (ref 8.4–10.5)
Chloride: 101 mEq/L (ref 96–112)
GFR calc Af Amer: 21 mL/min — ABNORMAL LOW (ref >90)
GFR calc non Af Amer: 18 mL/min — ABNORMAL LOW (ref >90)
Glucose, Bld: 134 mg/dL — ABNORMAL HIGH (ref 70–99)
POTASSIUM: 4.1 meq/L (ref 3.7–5.3)
Sodium: 137 mEq/L (ref 137–147)

## 2013-09-12 LAB — CBC WITH DIFFERENTIAL/PLATELET
Basophils Absolute: 0 10*3/uL (ref 0.0–0.1)
Basophils Relative: 1 % (ref 0–1)
Eosinophils Absolute: 0 10*3/uL (ref 0.0–0.7)
Eosinophils Relative: 2 % (ref 0–5)
HEMATOCRIT: 27 % — AB (ref 36.0–46.0)
HEMOGLOBIN: 9.2 g/dL — AB (ref 12.0–15.0)
Lymphocytes Relative: 12 % (ref 12–46)
Lymphs Abs: 0.2 10*3/uL — ABNORMAL LOW (ref 0.7–4.0)
MCH: 33.6 pg (ref 26.0–34.0)
MCHC: 34.1 g/dL (ref 30.0–36.0)
MCV: 98.5 fL (ref 78.0–100.0)
MONO ABS: 0.2 10*3/uL (ref 0.1–1.0)
MONOS PCT: 11 % (ref 3–12)
NEUTROS ABS: 1.5 10*3/uL — AB (ref 1.7–7.7)
Neutrophils Relative %: 75 % (ref 43–77)
Platelets: 187 10*3/uL (ref 150–400)
RBC: 2.74 MIL/uL — ABNORMAL LOW (ref 3.87–5.11)
RDW: 15.1 % (ref 11.5–15.5)
WBC: 2 10*3/uL — ABNORMAL LOW (ref 4.0–10.5)

## 2013-09-12 MED ORDER — DIPHENHYDRAMINE HCL 50 MG/ML IJ SOLN
50.0000 mg | Freq: Once | INTRAMUSCULAR | Status: AC
  Administered 2013-09-12: 50 mg via INTRAVENOUS

## 2013-09-12 MED ORDER — HEPARIN SOD (PORK) LOCK FLUSH 100 UNIT/ML IV SOLN
500.0000 [IU] | Freq: Once | INTRAVENOUS | Status: AC | PRN
  Administered 2013-09-12: 500 [IU]
  Filled 2013-09-12: qty 5

## 2013-09-12 MED ORDER — SODIUM CHLORIDE 0.9 % IV SOLN
Freq: Once | INTRAVENOUS | Status: AC
  Administered 2013-09-12: 12:00:00 via INTRAVENOUS

## 2013-09-12 MED ORDER — SODIUM CHLORIDE 0.9 % IV SOLN: 110.2000 mg | Freq: Once | INTRAVENOUS | Status: DC

## 2013-09-12 MED ORDER — PACLITAXEL CHEMO INJECTION 300 MG/50ML
45.0000 mg/m2 | Freq: Once | INTRAVENOUS | Status: AC
  Administered 2013-09-12: 84 mg via INTRAVENOUS
  Filled 2013-09-12: qty 14

## 2013-09-12 MED ORDER — SODIUM CHLORIDE 0.9 % IV SOLN
100.0000 mg | Freq: Once | INTRAVENOUS | Status: AC
  Administered 2013-09-12: 100 mg via INTRAVENOUS
  Filled 2013-09-12: qty 10

## 2013-09-12 MED ORDER — FAMOTIDINE IN NACL 20-0.9 MG/50ML-% IV SOLN
INTRAVENOUS | Status: AC
  Filled 2013-09-12: qty 50

## 2013-09-12 MED ORDER — SODIUM CHLORIDE 0.9 % IJ SOLN
10.0000 mL | INTRAMUSCULAR | Status: DC | PRN
  Administered 2013-09-12: 10 mL

## 2013-09-12 MED ORDER — ONDANSETRON HCL 40 MG/20ML IJ SOLN
Freq: Once | INTRAMUSCULAR | Status: AC
  Administered 2013-09-12: 16 mg via INTRAVENOUS
  Filled 2013-09-12: qty 8

## 2013-09-12 MED ORDER — FAMOTIDINE IN NACL 20-0.9 MG/50ML-% IV SOLN
20.0000 mg | Freq: Once | INTRAVENOUS | Status: AC
  Administered 2013-09-12: 20 mg via INTRAVENOUS

## 2013-09-12 MED ORDER — DIPHENHYDRAMINE HCL 50 MG/ML IJ SOLN
INTRAMUSCULAR | Status: AC
  Filled 2013-09-12: qty 1

## 2013-09-12 NOTE — Patient Instructions (Signed)
[facility] Discharge Instructions  RECOMMENDATIONS MADE BY THE CONSULTANT AND ANY TEST RESULTS WILL BE SENT TO YOUR REFERRING PHYSICIAN.   MEDICATIONS PRESCRIBED:  none  INSTRUCTIONS/FOLLOW-UP: Continue your liquid pain medication. Continue your radiation. Ct scans in 4 weeks (try to have before we see you).  You will see the doctor in 4 weeks.  Thank you for choosing [facility] to provide your oncology and hematology care.  To afford each patient quality time with our providers, please arrive at least 15 minutes before your scheduled appointment time.  With your help, our goal is to use those 15 minutes to complete the necessary work-up to ensure our physicians have the information they need to help with your evaluation and healthcare recommendations.    Effective January 1st, 2014, we ask that you re-schedule your appointment with our physicians should you arrive 10 or more minutes late for your appointment.  We strive to give you quality time with our providers, and arriving late affects you and other patients whose appointments are after yours.    Again, thank you for choosing [facility].  Our hope is that these requests will decrease the amount of time that you wait before being seen by our physicians.       _____________________________________________________________  Should you have questions after your visit to [facility], please contact our office at [phone] between the hours of 8:30 a.m. and 5:00 p.m.  Voicemails left after 4:30 p.m. will not be returned until the following business day.  For prescription refill requests, have your pharmacy contact our office with your prescription refill request.

## 2013-09-15 DIAGNOSIS — C3490 Malignant neoplasm of unspecified part of unspecified bronchus or lung: Secondary | ICD-10-CM | POA: Diagnosis not present

## 2013-09-15 DIAGNOSIS — Z510 Encounter for antineoplastic radiation therapy: Secondary | ICD-10-CM | POA: Diagnosis not present

## 2013-09-15 DIAGNOSIS — Z79899 Other long term (current) drug therapy: Secondary | ICD-10-CM | POA: Diagnosis not present

## 2013-09-15 DIAGNOSIS — R1310 Dysphagia, unspecified: Secondary | ICD-10-CM | POA: Diagnosis not present

## 2013-09-15 DIAGNOSIS — R05 Cough: Secondary | ICD-10-CM | POA: Diagnosis not present

## 2013-09-15 DIAGNOSIS — R059 Cough, unspecified: Secondary | ICD-10-CM | POA: Diagnosis not present

## 2013-09-16 DIAGNOSIS — R1310 Dysphagia, unspecified: Secondary | ICD-10-CM | POA: Diagnosis not present

## 2013-09-16 DIAGNOSIS — C3490 Malignant neoplasm of unspecified part of unspecified bronchus or lung: Secondary | ICD-10-CM | POA: Diagnosis not present

## 2013-09-16 DIAGNOSIS — R059 Cough, unspecified: Secondary | ICD-10-CM | POA: Diagnosis not present

## 2013-09-16 DIAGNOSIS — Z510 Encounter for antineoplastic radiation therapy: Secondary | ICD-10-CM | POA: Diagnosis not present

## 2013-09-16 DIAGNOSIS — Z79899 Other long term (current) drug therapy: Secondary | ICD-10-CM | POA: Diagnosis not present

## 2013-09-16 DIAGNOSIS — R05 Cough: Secondary | ICD-10-CM | POA: Diagnosis not present

## 2013-09-17 DIAGNOSIS — R05 Cough: Secondary | ICD-10-CM | POA: Diagnosis not present

## 2013-09-17 DIAGNOSIS — Z510 Encounter for antineoplastic radiation therapy: Secondary | ICD-10-CM | POA: Diagnosis not present

## 2013-09-17 DIAGNOSIS — R059 Cough, unspecified: Secondary | ICD-10-CM | POA: Diagnosis not present

## 2013-09-17 DIAGNOSIS — R1310 Dysphagia, unspecified: Secondary | ICD-10-CM | POA: Diagnosis not present

## 2013-09-17 DIAGNOSIS — C3490 Malignant neoplasm of unspecified part of unspecified bronchus or lung: Secondary | ICD-10-CM | POA: Diagnosis not present

## 2013-09-17 DIAGNOSIS — Z79899 Other long term (current) drug therapy: Secondary | ICD-10-CM | POA: Diagnosis not present

## 2013-09-18 ENCOUNTER — Inpatient Hospital Stay (HOSPITAL_COMMUNITY): Payer: Medicare Other

## 2013-09-18 DIAGNOSIS — Z79899 Other long term (current) drug therapy: Secondary | ICD-10-CM | POA: Diagnosis not present

## 2013-09-18 DIAGNOSIS — R059 Cough, unspecified: Secondary | ICD-10-CM | POA: Diagnosis not present

## 2013-09-18 DIAGNOSIS — R1310 Dysphagia, unspecified: Secondary | ICD-10-CM | POA: Diagnosis not present

## 2013-09-18 DIAGNOSIS — C3490 Malignant neoplasm of unspecified part of unspecified bronchus or lung: Secondary | ICD-10-CM | POA: Diagnosis not present

## 2013-09-18 DIAGNOSIS — Z510 Encounter for antineoplastic radiation therapy: Secondary | ICD-10-CM | POA: Diagnosis not present

## 2013-09-18 DIAGNOSIS — R05 Cough: Secondary | ICD-10-CM | POA: Diagnosis not present

## 2013-09-19 ENCOUNTER — Other Ambulatory Visit (HOSPITAL_COMMUNITY): Payer: Self-pay | Admitting: *Deleted

## 2013-09-19 ENCOUNTER — Encounter (HOSPITAL_COMMUNITY): Payer: Self-pay | Admitting: *Deleted

## 2013-09-19 ENCOUNTER — Inpatient Hospital Stay (HOSPITAL_COMMUNITY): Payer: Medicare Other

## 2013-09-19 DIAGNOSIS — R1310 Dysphagia, unspecified: Secondary | ICD-10-CM | POA: Diagnosis not present

## 2013-09-19 DIAGNOSIS — C3490 Malignant neoplasm of unspecified part of unspecified bronchus or lung: Secondary | ICD-10-CM | POA: Diagnosis not present

## 2013-09-19 DIAGNOSIS — R05 Cough: Secondary | ICD-10-CM | POA: Diagnosis not present

## 2013-09-19 DIAGNOSIS — C3492 Malignant neoplasm of unspecified part of left bronchus or lung: Secondary | ICD-10-CM

## 2013-09-19 DIAGNOSIS — Z510 Encounter for antineoplastic radiation therapy: Secondary | ICD-10-CM | POA: Diagnosis not present

## 2013-09-19 DIAGNOSIS — R059 Cough, unspecified: Secondary | ICD-10-CM | POA: Diagnosis not present

## 2013-09-19 DIAGNOSIS — Z79899 Other long term (current) drug therapy: Secondary | ICD-10-CM | POA: Diagnosis not present

## 2013-09-22 ENCOUNTER — Emergency Department (HOSPITAL_COMMUNITY): Payer: Medicare Other

## 2013-09-22 ENCOUNTER — Encounter (HOSPITAL_COMMUNITY): Payer: Self-pay | Admitting: Emergency Medicine

## 2013-09-22 ENCOUNTER — Other Ambulatory Visit: Payer: Self-pay

## 2013-09-22 ENCOUNTER — Inpatient Hospital Stay (HOSPITAL_COMMUNITY)
Admission: EM | Admit: 2013-09-22 | Discharge: 2013-09-26 | DRG: 682 | Disposition: A | Discharge status: Home / Self Care | Payer: Medicare Other | Attending: Family Medicine | Admitting: Family Medicine

## 2013-09-22 DIAGNOSIS — Z9221 Personal history of antineoplastic chemotherapy: Secondary | ICD-10-CM

## 2013-09-22 DIAGNOSIS — E785 Hyperlipidemia, unspecified: Secondary | ICD-10-CM | POA: Diagnosis present

## 2013-09-22 DIAGNOSIS — Z510 Encounter for antineoplastic radiation therapy: Secondary | ICD-10-CM | POA: Diagnosis not present

## 2013-09-22 DIAGNOSIS — C3492 Malignant neoplasm of unspecified part of left bronchus or lung: Secondary | ICD-10-CM

## 2013-09-22 DIAGNOSIS — N179 Acute kidney failure, unspecified: Principal | ICD-10-CM | POA: Diagnosis present

## 2013-09-22 DIAGNOSIS — Z66 Do not resuscitate: Secondary | ICD-10-CM | POA: Diagnosis present

## 2013-09-22 DIAGNOSIS — E039 Hypothyroidism, unspecified: Secondary | ICD-10-CM | POA: Diagnosis present

## 2013-09-22 DIAGNOSIS — M1710 Unilateral primary osteoarthritis, unspecified knee: Secondary | ICD-10-CM | POA: Diagnosis present

## 2013-09-22 DIAGNOSIS — I739 Peripheral vascular disease, unspecified: Secondary | ICD-10-CM | POA: Diagnosis present

## 2013-09-22 DIAGNOSIS — D509 Iron deficiency anemia, unspecified: Secondary | ICD-10-CM | POA: Diagnosis present

## 2013-09-22 DIAGNOSIS — R197 Diarrhea, unspecified: Secondary | ICD-10-CM | POA: Diagnosis not present

## 2013-09-22 DIAGNOSIS — Z7982 Long term (current) use of aspirin: Secondary | ICD-10-CM

## 2013-09-22 DIAGNOSIS — K589 Irritable bowel syndrome without diarrhea: Secondary | ICD-10-CM | POA: Diagnosis present

## 2013-09-22 DIAGNOSIS — K7689 Other specified diseases of liver: Secondary | ICD-10-CM | POA: Diagnosis present

## 2013-09-22 DIAGNOSIS — R627 Adult failure to thrive: Secondary | ICD-10-CM | POA: Diagnosis present

## 2013-09-22 DIAGNOSIS — Z79899 Other long term (current) drug therapy: Secondary | ICD-10-CM | POA: Diagnosis not present

## 2013-09-22 DIAGNOSIS — J449 Chronic obstructive pulmonary disease, unspecified: Secondary | ICD-10-CM | POA: Diagnosis not present

## 2013-09-22 DIAGNOSIS — A09 Infectious gastroenteritis and colitis, unspecified: Secondary | ICD-10-CM | POA: Diagnosis not present

## 2013-09-22 DIAGNOSIS — R1310 Dysphagia, unspecified: Secondary | ICD-10-CM | POA: Diagnosis present

## 2013-09-22 DIAGNOSIS — Z8249 Family history of ischemic heart disease and other diseases of the circulatory system: Secondary | ICD-10-CM

## 2013-09-22 DIAGNOSIS — K208 Other esophagitis without bleeding: Secondary | ICD-10-CM

## 2013-09-22 DIAGNOSIS — K5289 Other specified noninfective gastroenteritis and colitis: Secondary | ICD-10-CM | POA: Diagnosis not present

## 2013-09-22 DIAGNOSIS — D709 Neutropenia, unspecified: Secondary | ICD-10-CM

## 2013-09-22 DIAGNOSIS — I129 Hypertensive chronic kidney disease with stage 1 through stage 4 chronic kidney disease, or unspecified chronic kidney disease: Secondary | ICD-10-CM | POA: Diagnosis present

## 2013-09-22 DIAGNOSIS — S0990XA Unspecified injury of head, initial encounter: Secondary | ICD-10-CM | POA: Diagnosis not present

## 2013-09-22 DIAGNOSIS — C3490 Malignant neoplasm of unspecified part of unspecified bronchus or lung: Secondary | ICD-10-CM | POA: Diagnosis present

## 2013-09-22 DIAGNOSIS — I10 Essential (primary) hypertension: Secondary | ICD-10-CM | POA: Diagnosis present

## 2013-09-22 DIAGNOSIS — N184 Chronic kidney disease, stage 4 (severe): Secondary | ICD-10-CM | POA: Diagnosis present

## 2013-09-22 DIAGNOSIS — R059 Cough, unspecified: Secondary | ICD-10-CM | POA: Diagnosis not present

## 2013-09-22 DIAGNOSIS — T66XXXA Radiation sickness, unspecified, initial encounter: Secondary | ICD-10-CM

## 2013-09-22 DIAGNOSIS — D61818 Other pancytopenia: Secondary | ICD-10-CM | POA: Diagnosis present

## 2013-09-22 DIAGNOSIS — E43 Unspecified severe protein-calorie malnutrition: Secondary | ICD-10-CM | POA: Diagnosis not present

## 2013-09-22 DIAGNOSIS — Z833 Family history of diabetes mellitus: Secondary | ICD-10-CM

## 2013-09-22 DIAGNOSIS — F411 Generalized anxiety disorder: Secondary | ICD-10-CM | POA: Diagnosis present

## 2013-09-22 DIAGNOSIS — N189 Chronic kidney disease, unspecified: Secondary | ICD-10-CM

## 2013-09-22 DIAGNOSIS — Z923 Personal history of irradiation: Secondary | ICD-10-CM | POA: Diagnosis not present

## 2013-09-22 DIAGNOSIS — E860 Dehydration: Secondary | ICD-10-CM | POA: Diagnosis not present

## 2013-09-22 DIAGNOSIS — Y842 Radiological procedure and radiotherapy as the cause of abnormal reaction of the patient, or of later complication, without mention of misadventure at the time of the procedure: Secondary | ICD-10-CM | POA: Diagnosis present

## 2013-09-22 DIAGNOSIS — R5383 Other fatigue: Secondary | ICD-10-CM | POA: Diagnosis not present

## 2013-09-22 DIAGNOSIS — F17200 Nicotine dependence, unspecified, uncomplicated: Secondary | ICD-10-CM | POA: Diagnosis present

## 2013-09-22 DIAGNOSIS — T451X5A Adverse effect of antineoplastic and immunosuppressive drugs, initial encounter: Secondary | ICD-10-CM | POA: Diagnosis present

## 2013-09-22 DIAGNOSIS — S298XXA Other specified injuries of thorax, initial encounter: Secondary | ICD-10-CM | POA: Diagnosis not present

## 2013-09-22 DIAGNOSIS — K219 Gastro-esophageal reflux disease without esophagitis: Secondary | ICD-10-CM | POA: Diagnosis present

## 2013-09-22 DIAGNOSIS — R5381 Other malaise: Secondary | ICD-10-CM | POA: Diagnosis not present

## 2013-09-22 DIAGNOSIS — IMO0002 Reserved for concepts with insufficient information to code with codable children: Secondary | ICD-10-CM

## 2013-09-22 DIAGNOSIS — I2510 Atherosclerotic heart disease of native coronary artery without angina pectoris: Secondary | ICD-10-CM | POA: Diagnosis present

## 2013-09-22 DIAGNOSIS — R404 Transient alteration of awareness: Secondary | ICD-10-CM | POA: Diagnosis not present

## 2013-09-22 DIAGNOSIS — R05 Cough: Secondary | ICD-10-CM | POA: Diagnosis not present

## 2013-09-22 DIAGNOSIS — R112 Nausea with vomiting, unspecified: Secondary | ICD-10-CM | POA: Diagnosis not present

## 2013-09-22 LAB — URINALYSIS, ROUTINE W REFLEX MICROSCOPIC
Bilirubin Urine: NEGATIVE
Glucose, UA: NEGATIVE mg/dL
HGB URINE DIPSTICK: NEGATIVE
Ketones, ur: NEGATIVE mg/dL
Leukocytes, UA: NEGATIVE
Nitrite: NEGATIVE
SPECIFIC GRAVITY, URINE: 1.025 (ref 1.005–1.030)
UROBILINOGEN UA: 0.2 mg/dL (ref 0.0–1.0)
pH: 5.5 (ref 5.0–8.0)

## 2013-09-22 LAB — COMPREHENSIVE METABOLIC PANEL
ALK PHOS: 39 U/L (ref 39–117)
ALT: 16 U/L (ref 0–35)
AST: 30 U/L (ref 0–37)
Albumin: 3.8 g/dL (ref 3.5–5.2)
BUN: 34 mg/dL — ABNORMAL HIGH (ref 6–23)
CALCIUM: 10.7 mg/dL — AB (ref 8.4–10.5)
CHLORIDE: 101 meq/L (ref 96–112)
CO2: 23 meq/L (ref 19–32)
Creatinine, Ser: 3.22 mg/dL — ABNORMAL HIGH (ref 0.50–1.10)
GFR calc Af Amer: 15 mL/min — ABNORMAL LOW (ref >90)
GFR, EST NON AFRICAN AMERICAN: 13 mL/min — AB (ref >90)
Glucose, Bld: 112 mg/dL — ABNORMAL HIGH (ref 70–99)
POTASSIUM: 5.1 meq/L (ref 3.7–5.3)
SODIUM: 136 meq/L — AB (ref 137–147)
Total Bilirubin: 0.4 mg/dL (ref 0.3–1.2)
Total Protein: 7.5 g/dL (ref 6.0–8.3)

## 2013-09-22 LAB — CBC WITH DIFFERENTIAL/PLATELET
Basophils Absolute: 0 10*3/uL (ref 0.0–0.1)
Basophils Relative: 1 % (ref 0–1)
EOS PCT: 1 % (ref 0–5)
Eosinophils Absolute: 0 10*3/uL (ref 0.0–0.7)
HCT: 26.3 % — ABNORMAL LOW (ref 36.0–46.0)
Hemoglobin: 8.9 g/dL — ABNORMAL LOW (ref 12.0–15.0)
LYMPHS PCT: 14 % (ref 12–46)
Lymphs Abs: 0.2 10*3/uL — ABNORMAL LOW (ref 0.7–4.0)
MCH: 33.7 pg (ref 26.0–34.0)
MCHC: 33.8 g/dL (ref 30.0–36.0)
MCV: 99.6 fL (ref 78.0–100.0)
Monocytes Absolute: 0.3 10*3/uL (ref 0.1–1.0)
Monocytes Relative: 20 % — ABNORMAL HIGH (ref 3–12)
NEUTROS ABS: 0.9 10*3/uL — AB (ref 1.7–7.7)
Neutrophils Relative %: 65 % (ref 43–77)
PLATELETS: 210 10*3/uL (ref 150–400)
RBC: 2.64 MIL/uL — AB (ref 3.87–5.11)
RDW: 16.1 % — ABNORMAL HIGH (ref 11.5–15.5)
WBC: 1.4 10*3/uL — AB (ref 4.0–10.5)

## 2013-09-22 LAB — PROTIME-INR
INR: 1.04 (ref 0.00–1.49)
Prothrombin Time: 13.4 seconds (ref 11.6–15.2)

## 2013-09-22 LAB — TROPONIN I: Troponin I: <0.3 ng/mL (ref <0.30)

## 2013-09-22 LAB — APTT: APTT: 38 s — AB (ref 24–37)

## 2013-09-22 LAB — URINE MICROSCOPIC-ADD ON

## 2013-09-22 MED ORDER — ALLOPURINOL 100 MG PO TABS
100.0000 mg | ORAL_TABLET | Freq: Every day | ORAL | Status: DC
  Administered 2013-09-22 – 2013-09-26 (×5): 100 mg via ORAL
  Filled 2013-09-22 (×6): qty 1

## 2013-09-22 MED ORDER — PANTOPRAZOLE SODIUM 40 MG PO TBEC
40.0000 mg | DELAYED_RELEASE_TABLET | Freq: Two times a day (BID) | ORAL | Status: DC
  Administered 2013-09-22 – 2013-09-25 (×8): 40 mg via ORAL
  Filled 2013-09-22 (×10): qty 1

## 2013-09-22 MED ORDER — PAROXETINE HCL 20 MG PO TABS
30.0000 mg | ORAL_TABLET | Freq: Every day | ORAL | Status: DC
  Administered 2013-09-22 – 2013-09-26 (×5): 30 mg via ORAL
  Filled 2013-09-22 (×5): qty 2

## 2013-09-22 MED ORDER — ONDANSETRON HCL 4 MG/2ML IJ SOLN
4.0000 mg | Freq: Four times a day (QID) | INTRAMUSCULAR | Status: DC | PRN
  Administered 2013-09-23 – 2013-09-25 (×2): 4 mg via INTRAVENOUS
  Filled 2013-09-22 (×2): qty 2

## 2013-09-22 MED ORDER — ASPIRIN EC 81 MG PO TBEC
81.0000 mg | DELAYED_RELEASE_TABLET | Freq: Every day | ORAL | Status: DC
  Administered 2013-09-22 – 2013-09-25 (×4): 81 mg via ORAL
  Filled 2013-09-22 (×5): qty 1

## 2013-09-22 MED ORDER — ACETAMINOPHEN 325 MG PO TABS: 650.0000 mg | ORAL_TABLET | Freq: Four times a day (QID) | ORAL | Status: DC | PRN

## 2013-09-22 MED ORDER — MAGNESIUM OXIDE 400 (241.3 MG) MG PO TABS
750.0000 mg | ORAL_TABLET | Freq: Every day | ORAL | Status: DC
  Administered 2013-09-22 – 2013-09-26 (×5): 800 mg via ORAL
  Filled 2013-09-22 (×5): qty 2

## 2013-09-22 MED ORDER — LEVOTHYROXINE SODIUM 137 MCG PO TABS
137.0000 ug | ORAL_TABLET | Freq: Every day | ORAL | Status: DC
  Administered 2013-09-23 – 2013-09-26 (×4): 137 ug via ORAL
  Filled 2013-09-22 (×7): qty 1

## 2013-09-22 MED ORDER — ALPRAZOLAM 0.25 MG PO TABS
0.2500 mg | ORAL_TABLET | Freq: Three times a day (TID) | ORAL | Status: DC | PRN
Start: 2013-09-22 — End: 2013-09-23
  Administered 2013-09-22: 0.25 mg via ORAL
  Filled 2013-09-22: qty 1

## 2013-09-22 MED ORDER — SODIUM CHLORIDE 0.9 % IV BOLUS (SEPSIS)
1000.0000 mL | Freq: Once | INTRAVENOUS | Status: AC
  Administered 2013-09-22: 1000 mL via INTRAVENOUS

## 2013-09-22 MED ORDER — FENOFIBRATE 160 MG PO TABS
160.0000 mg | ORAL_TABLET | Freq: Every day | ORAL | Status: DC
  Administered 2013-09-22 – 2013-09-26 (×5): 160 mg via ORAL
  Filled 2013-09-22 (×8): qty 1

## 2013-09-22 MED ORDER — SUCRALFATE 1 G PO TABS
1.0000 g | ORAL_TABLET | Freq: Three times a day (TID) | ORAL | Status: DC
  Administered 2013-09-22 – 2013-09-26 (×16): 1 g via ORAL
  Filled 2013-09-22 (×17): qty 1

## 2013-09-22 MED ORDER — AMLODIPINE BESYLATE 5 MG PO TABS
5.0000 mg | ORAL_TABLET | Freq: Every day | ORAL | Status: DC
  Administered 2013-09-22 – 2013-09-26 (×5): 5 mg via ORAL
  Filled 2013-09-22 (×5): qty 1

## 2013-09-22 MED ORDER — HYDRALAZINE HCL 25 MG PO TABS
25.0000 mg | ORAL_TABLET | Freq: Three times a day (TID) | ORAL | Status: DC
  Administered 2013-09-22 – 2013-09-26 (×12): 25 mg via ORAL
  Filled 2013-09-22 (×13): qty 1

## 2013-09-22 MED ORDER — SODIUM CHLORIDE 0.9 % IV SOLN
INTRAVENOUS | Status: DC
  Administered 2013-09-22 – 2013-09-25 (×7): via INTRAVENOUS

## 2013-09-22 MED ORDER — FLUCONAZOLE 100MG IVPB
100.0000 mg | INTRAVENOUS | Status: DC
  Administered 2013-09-22 – 2013-09-25 (×4): 100 mg via INTRAVENOUS
  Filled 2013-09-22 (×4): qty 50

## 2013-09-22 MED ORDER — ACETAMINOPHEN 650 MG RE SUPP: 650.0000 mg | Freq: Four times a day (QID) | RECTAL | Status: DC | PRN

## 2013-09-22 MED ORDER — HYDROCODONE-ACETAMINOPHEN 10-325 MG PO TABS
1.0000 | ORAL_TABLET | Freq: Four times a day (QID) | ORAL | Status: DC | PRN
  Administered 2013-09-25 (×2): 1 via ORAL
  Filled 2013-09-22 (×2): qty 1

## 2013-09-22 MED ORDER — TEMAZEPAM 15 MG PO CAPS: 15.0000 mg | ORAL_CAPSULE | Freq: Every evening | ORAL | Status: DC | PRN

## 2013-09-22 MED ORDER — ENOXAPARIN SODIUM 30 MG/0.3ML ~~LOC~~ SOLN
30.0000 mg | SUBCUTANEOUS | Status: DC
  Administered 2013-09-22 – 2013-09-26 (×5): 30 mg via SUBCUTANEOUS
  Filled 2013-09-22 (×5): qty 0.3

## 2013-09-22 MED ORDER — ONDANSETRON HCL 4 MG PO TABS: 4.0000 mg | ORAL_TABLET | Freq: Four times a day (QID) | ORAL | Status: DC | PRN

## 2013-09-22 MED ORDER — TRAMADOL HCL 50 MG PO TABS: 50.0000 mg | ORAL_TABLET | Freq: Four times a day (QID) | ORAL | Status: DC | PRN

## 2013-09-22 MED ORDER — ZOLPIDEM TARTRATE 5 MG PO TABS
5.0000 mg | ORAL_TABLET | Freq: Every evening | ORAL | Status: DC | PRN
  Administered 2013-09-24: 5 mg via ORAL
  Filled 2013-09-22 (×2): qty 1

## 2013-09-22 MED ORDER — BENZONATATE 100 MG PO CAPS
200.0000 mg | ORAL_CAPSULE | ORAL | Status: DC | PRN
  Administered 2013-09-24 (×2): 200 mg via ORAL
  Filled 2013-09-22 (×2): qty 2

## 2013-09-22 NOTE — ED Provider Notes (Signed)
CSN: [id number]     Arrival date & time 09/22/13  4696 History  This chart was scribed for Charles B. Karle Starch, MD by Roe Coombs, ED Scribe. The patient was seen in room APA16A/APA16A. Patient's care was started at 9:49 AM.    Chief Complaint  Patient presents with  . Fall    The history is provided by the patient and a relative. No language interpreter was used.    HPI Comments: [name] is a 79 y.o. female with a history of lung cancer currently being treated with chemo and  radiation who presents to the Emergency Department complaining of a fall that occurred this morning. Patient reports that she got up to go to the bathroom and fell to the floor hitting her head and her back against the nearby wall. She is not sure what caused her to fall. She states that she couldn't stand up after the fall due to lower leg weakness and had to crawl to the phone to call EMS. She denies syncope prior to the fall. She says that over the past few days, she has not been feeling well. She finished her most recent round of chemotherapy last Friday. Patient has had some shortness of breath, decreased appetite and feels generally weak. She is also reporting some cramping in her feet and pain with swallowing due to her cancer medications. She denies any other pain at this time. She denies chest pain, dysuria, urinary difficulty, vomiting, diarrhea or any other symptoms at this time.   Past Medical History  Diagnosis Date  . Hypertension   . Hypothyroidism   . Hyperlipidemia   . Insomnia   . GERD (gastroesophageal reflux disease)     chronic gastritis  . Adrenal adenoma   . Chronic diarrhea   . Fatty liver   . Arthritis     knee  . QT prolongation     syncope  . Coronary artery disease   . Impaired fasting glucose   . Renal insufficiency     low protien diet  . Tobacco use     1/2 ppd, approx 25-50 pack years (as of 08/2012)  . Family history of heart disease   . IBS (irritable bowel syndrome)    . Peripheral arterial disease     sstatus post  infrarenal abdominal aortic tube graft placed by Dr. Victorino Dike February 2008  . Cancer     Lung  . Squamous cell carcinoma of left lung 07/29/2013   Past Surgical History  Procedure Laterality Date  . Abdominal hysterectomy    . Thyroidectomy, partial    . Colonoscopy  5/04    normal  . Abdominal aortic aneurysm repair  07/2006    D. J.D. Kellie Simmering  . Cataract extraction Bilateral 2010  . Cardiac catheterization  02/2010    mod CAD in L-dominant system with normal LV function  . Colon resection  2005    1/2 colon removed  . Transthoracic echocardiogram  02/2010    EF 55-60%; mild conc LVH, normal systolic function; mildly calcified AV annulus  . Colonoscopy with esophagogastroduodenoscopy (egd) N/A 03/19/2013    Procedure: COLONOSCOPY WITH ESOPHAGOGASTRODUODENOSCOPY (EGD);  Surgeon: Rogene Houston, MD;  Location: [facility];  Service: Endoscopy;  Laterality: N/A;  145  . Dg biopsy lung Left Jan 2015   Family History  Problem Relation Age of Onset  . Hypertension Mother   . Diabetes Mother   . Heart attack Mother   . Hyperlipidemia Sister  x3 sister  . Kidney disease Brother    History  Substance Use Topics  . Smoking status: Current Every Day Smoker -- 0.50 packs/day for 59 years    Types: Cigarettes  . Smokeless tobacco: Never Used     Comment: smoking since age [age]  . Alcohol Use: No   OB History   Grav Para Term Preterm Abortions TAB SAB Ect Mult Living                 Review of Systems A complete 10 system review of systems was obtained and all systems are negative except as noted in the HPI and PMH.    Allergies  Aleve; Dilaudid; Dyazide; and Zithromax  Home Medications   Current Outpatient Rx  Name  Route  Sig  Dispense  Refill  . allopurinol (ZYLOPRIM) 300 MG tablet   Oral   Take 300 mg by mouth daily.         Marland Kitchen allopurinol (ZYLOPRIM) 300 MG tablet      take 1 tablet by mouth once daily   30  tablet   5   . ALPRAZolam (XANAX) 1 MG tablet      TAKE ONE HALF TABLET BY MOUTH 4 TIMES DAILY AS NEEDED   60 tablet   5   . amLODipine (NORVASC) 10 MG tablet      TAKE ONE TABLET AT BEDTIME (STOP VERAPAMIL)   30 tablet   5   . aspirin EC 81 MG tablet   Oral   Take 81 mg by mouth daily.         . benzonatate (TESSALON) 200 MG capsule      TAKE ONE CAPSULE BY MOUTH THREE TIMES DAILY AS NEEDED FOR COUGH   30 capsule   1   . Cholecalciferol 1000 UNITS capsule   Oral   Take 1,000 Units by mouth daily.         . enalapril (VASOTEC) 20 MG tablet      take 1 tablet by mouth once daily   30 tablet   5   . esomeprazole (NEXIUM) 40 MG capsule   Oral   Take 40 mg by mouth daily at 12 noon.         . fenofibrate 160 MG tablet      take 1 tablet by mouth once daily with food   30 tablet   5   . ferrous sulfate (FERROUSUL) 325 (65 FE) MG tablet   Oral   Take 1 tablet (325 mg total) by mouth 2 (two) times daily after a meal.      3   . furosemide (LASIX) 20 MG tablet   Oral   Take 1 tablet (20 mg total) by mouth daily.   30 tablet   11   . guaifenesin (ROBITUSSIN) 100 MG/5ML syrup   Oral   Take 200 mg by mouth 3 (three) times daily as needed for cough.         . hydrALAZINE (APRESOLINE) 25 MG tablet      take 1 tablet by mouth three times a day   90 tablet   6   . HYDROcodone-acetaminophen (NORCO) 10-325 MG per tablet   Oral   Take 1 tablet by mouth every 6 (six) hours as needed.         . hydrOXYzine (ATARAX/VISTARIL) 25 MG tablet   Oral   Take 1 tablet (25 mg total) by mouth 3 (three) times daily as needed for itching.  60 tablet   5   . levothyroxine (SYNTHROID, LEVOTHROID) 137 MCG tablet   Oral   Take 137 mcg by mouth daily before breakfast.         . lidocaine-prilocaine (EMLA) cream      Apply a quarter size amount to port site 1 hour prior to chemo. Do not rub in. Cover with plastic wrap.   30 g   3   . loperamide (IMODIUM) 2  MG capsule   Oral   Take 2 mg by mouth 4 (four) times daily as needed for diarrhea or loose stools.         . magnesium oxide (MAG-OX) 400 MG tablet   Oral   Take 750 mg by mouth daily.          . metoCLOPramide (REGLAN) 5 MG tablet      Starting the day after chemo, take 1 tablet four times a day x 48 hours. Then may take 1 tablet four times a day IF needed for nausea/vomiting.   60 tablet   2   . ondansetron (ZOFRAN) 8 MG tablet      TAKE ONE TABLET BY MOUTH EVERY 8 HOURS AS NEEDED FOR NAUSEA   20 tablet   0   . PARoxetine (PAXIL) 30 MG tablet      take 1 tablet by mouth once daily   30 tablet   5   . potassium chloride SA (K-DUR,KLOR-CON) 20 MEQ tablet   Oral   Take 1 tablet (20 mEq total) by mouth 2 (two) times daily.   60 tablet   0   . pravastatin (PRAVACHOL) 20 MG tablet   Oral   Take 20 mg by mouth at bedtime.         . prochlorperazine (COMPAZINE) 10 MG tablet      Starting the day after chemo, take 1 tablet four times a day x 48 hours. Then may take 1 tablet four times a day IF needed for nausea/vomiting.   60 tablet   2   . temazepam (RESTORIL) 15 MG capsule   Oral   Take 1 capsule (15 mg total) by mouth at bedtime as needed for sleep.   30 capsule   5   . traMADol (ULTRAM) 50 MG tablet   Oral   Take 1 tablet (50 mg total) by mouth every 6 (six) hours as needed.   30 tablet   2   . zolpidem (AMBIEN) 10 MG tablet   Oral   Take 10 mg by mouth at bedtime as needed for sleep.          Triage Vitals: BP 110/55  Pulse 89  Temp(Src) 98.1 F (36.7 C) (Oral)  Resp 16  SpO2 99% Physical Exam  Nursing note and vitals reviewed. Constitutional: She is oriented to person, place, and time. She appears well-developed and well-nourished.  HENT:  Head: Normocephalic and atraumatic.  Eyes: EOM are normal. Pupils are equal, round, and reactive to light.  Neck: Normal range of motion. Neck supple.  Cardiovascular: Normal rate, normal heart sounds  and intact distal pulses.   Pulmonary/Chest: Effort normal and breath sounds normal.  Abdominal: Bowel sounds are normal. She exhibits no distension. There is no tenderness.  Musculoskeletal: Normal range of motion. She exhibits no edema and no tenderness.  Neurological: She is alert and oriented to person, place, and time. She has normal strength. No cranial nerve deficit or sensory deficit.  Skin: Skin is warm and dry. No  rash noted.  Psychiatric: She has a normal mood and affect.    ED Course  Procedures (including critical care time) DIAGNOSTIC STUDIES: Oxygen Saturation is 99% on room air, normal by my interpretation.    COORDINATION OF CARE: 9:56 AM- Will order IV fluids, head CT, CXR, CBC with diff, CMP, UA, INR, and APTT. Patient informed of current plan for treatment and evaluation and agrees with plan at this time.   11:40 AM - Labs suggestive of dehydration. Have spoken with Dr. Domenic Polite, on call hospitalist, who has agreed to admit patient.    Labs Review Labs Reviewed  CBC WITH DIFFERENTIAL - Abnormal; Notable for the following:    WBC 1.4 (*)    RBC 2.64 (*)    Hemoglobin 8.9 (*)    HCT 26.3 (*)    RDW 16.1 (*)    Neutro Abs 0.9 (*)    Lymphs Abs 0.2 (*)    Monocytes Relative 20 (*)    All other components within normal limits  COMPREHENSIVE METABOLIC PANEL - Abnormal; Notable for the following:    Sodium 136 (*)    Glucose, Bld 112 (*)    BUN 34 (*)    Creatinine, Ser 3.22 (*)    Calcium 10.7 (*)    GFR calc non Af Amer 13 (*)    GFR calc Af Amer 15 (*)    All other components within normal limits  URINALYSIS, ROUTINE W REFLEX MICROSCOPIC - Abnormal; Notable for the following:    Protein, ur TRACE (*)    All other components within normal limits  APTT - Abnormal; Notable for the following:    aPTT 38 (*)    All other components within normal limits  PROTIME-INR  TROPONIN I  URINE MICROSCOPIC-ADD ON    Imaging Review Dg Chest 2  View  09/22/2013   CLINICAL DATA:  Fall, weakness.  History of lung cancer.  EXAM: CHEST  2 VIEW  COMPARISON:  CT chest 06/25/2013. Single view of the chest 07/16/2013.  FINDINGS: The lungs are emphysematous. Cavitary left lower lobe mass is identified as seen on the prior CT. No consolidative process, pneumothorax or effusion is identified. Heart size is mildly enlarged. Port-A-Cath is noted.  IMPRESSION: No acute abnormality.  Cavitary left lower lobe mass.  Emphysema.   Electronically Signed   By: Inge Rise M.D.   On: 09/22/2013 10:40   Ct Head Wo Contrast  09/22/2013   CLINICAL DATA:  Fall.  Altered mental status.  EXAM: CT HEAD WITHOUT CONTRAST  TECHNIQUE: Contiguous axial images were obtained from the base of the skull through the vertex without intravenous contrast.  COMPARISON:  08/27/2013 MR.  FINDINGS: No skull fracture or intracranial hemorrhage.  Mild atrophy without hydrocephalus.  Small vessel disease type changes without CT evidence of large acute infarct.  No intracranial mass lesion noted on this unenhanced exam.  Visualized orbital structures unremarkable.  IMPRESSION: No skull fracture or intracranial hemorrhage.  Please see above.   Electronically Signed   By: Chauncey Cruel M.D.   On: 09/22/2013 10:54     EKG Interpretation None      MDM   Final diagnoses:  Dehydration  Neutropenia    Dehydration and neutropenia following chemotherapy for lung cancer. Admit for hydration.   I personally performed the services described in this documentation, which was scribed in my presence. The recorded information has been reviewed and is accurate.      Charles B. Karle Starch, MD 09/22/13 1427

## 2013-09-22 NOTE — Progress Notes (Signed)
UR chart review completed.  

## 2013-09-22 NOTE — H&P (Signed)
Pt seen and examined, agree with note per Leah Carrel NP 78/F with Stage 3B lung CA on current Chemo/XRT Presents to ER with weakness, Dizziness, dysphagia/Odynophagia for weeks, unable to tolerate PO, only drinks a few sips here and there per Daughter due to this. -Now with AKI on CKD -Will admit, hydrate, hold ACE/DIuretic -For severe ongoing odynophagia: will ask Gi to eval; D/D includes radiation esophagitis vs Candida vs Herpes, Add Carafate, PPI, Empiric FLuconazole   Code status: DNR  Domenic Polite, MD [phone]

## 2013-09-22 NOTE — ED Notes (Signed)
Hospitalist at the bedside 

## 2013-09-22 NOTE — ED Notes (Signed)
Per EMS, Generalized weakness. Began chemo/radiation 6 weeks ago. Pt hit the wall this morning and fell to the floor and had to crawl to the phone. Denies new pain. States back pain which has been chronic, per EMS

## 2013-09-22 NOTE — ED Notes (Signed)
CRITICAL VALUE ALERT  Critical value received:  WBC 1.4  Date of notification:  09/22/2013  Time of notification:  1034  Critical value read back:yes  Nurse who received alert:  Domenica Reamer RN   MD notified (1st page):  Dr Karle Starch  Time of first page:  1034  MD notified (2nd page):  Time of second page:  Responding MD:  Dr Karle Starch  Time MD responded:  1034

## 2013-09-22 NOTE — H&P (Signed)
[facility] History and Physical  Alanda Colton OJJ:[id number] DOB: [birth year] DOA: 09/22/2013  Referring physician:  PCP: [name], MD   Chief Complaint: weakness/fall/decreased appetite/ painful swallowing  HPI: [name] is a 79 y.o. female with past medical hx includes HTN, stage 3B squamous cell carcinoma undergoing radiation having completed chemo presents to ED cc weakness/painful swallowing no po intake for 2-3 weeks. Information obtained from daughter. Indicates patient has no been eating or drinking for 2-3 weeks due to painful swallowing from radiation. Has been given carafate with little improvement. Associated symptoms include generalized weakness, nausea without vomiting and dizziness. This am upon getting out of bed she fell. She reports her legs were too weak to hold her up. She denies syncope. She reports finishing chemo just last week. She also has hx diarrhea due to remote resection but states less stools lately. Work up in ED reveals dehydration, acute renal failure and pancytopenia. VSS she is afebrile and non-toxic appearing. In the ed she was given 1L NS IV. We are asked to admit    Review of Systems:  10 point review of systems complete and all systems are negative except as indicated by history of present illness   Past Medical History  Diagnosis Date  . Hypertension   . Hypothyroidism   . Hyperlipidemia   . Insomnia   . GERD (gastroesophageal reflux disease)     chronic gastritis  . Adrenal adenoma   . Chronic diarrhea   . Fatty liver   . Arthritis     knee  . QT prolongation     syncope  . Coronary artery disease   . Impaired fasting glucose   . Renal insufficiency     low protien diet  . Tobacco use     1/2 ppd, approx 25-50 pack years (as of 08/2012)  . Family history of heart disease   . IBS (irritable bowel syndrome)   . Peripheral arterial disease     sstatus post  infrarenal abdominal aortic tube graft placed by Dr. Victorino Dike  February 2008  . Cancer     Lung  . Squamous cell carcinoma of left lung 07/29/2013   Past Surgical History  Procedure Laterality Date  . Abdominal hysterectomy    . Thyroidectomy, partial    . Colonoscopy  5/04    normal  . Abdominal aortic aneurysm repair  07/2006    D. J.D. Kellie Simmering  . Cataract extraction Bilateral 2010  . Cardiac catheterization  02/2010    mod CAD in L-dominant system with normal LV function  . Colon resection  2005    1/2 colon removed  . Transthoracic echocardiogram  02/2010    EF 55-60%; mild conc LVH, normal systolic function; mildly calcified AV annulus  . Colonoscopy with esophagogastroduodenoscopy (egd) N/A 03/19/2013    Procedure: COLONOSCOPY WITH ESOPHAGOGASTRODUODENOSCOPY (EGD);  Surgeon: Rogene Houston, MD;  Location: [facility];  Service: Endoscopy;  Laterality: N/A;  145  . Dg biopsy lung Left Jan 2015   Social History:  reports that she has been smoking Cigarettes.  She has a 29.5 pack-year smoking history. She has never used smokeless tobacco. She reports that she does not drink alcohol or use illicit drugs. She lives alone. She is independent with ADLs. Family has been staying at her last couple days Allergies  Allergen Reactions  . Aleve [Naproxen Sodium] Other (See Comments)    GI bleed  . Dilaudid [Hydromorphone Hcl]     Can not tolerate  .  Dyazide [Hydrochlorothiazide W-Triamterene] Other (See Comments)    cramps  . Zithromax [Azithromycin] Itching    Family History  Problem Relation Age of Onset  . Hypertension Mother   . Diabetes Mother   . Heart attack Mother   . Hyperlipidemia Sister     x3 sister  . Kidney disease Brother      Prior to Admission medications   Medication Sig Start Date End Date Taking? Authorizing Provider  allopurinol (ZYLOPRIM) 300 MG tablet Take 300 mg by mouth daily.    Historical Provider, MD  allopurinol (ZYLOPRIM) 300 MG tablet take 1 tablet by mouth once daily 09/09/13   Mikey Kirschner, MD    ALPRAZolam Duanne Moron) 1 MG tablet TAKE ONE HALF TABLET BY MOUTH 4 TIMES DAILY AS NEEDED 07/18/13   Mikey Kirschner, MD  amLODipine (NORVASC) 10 MG tablet TAKE ONE TABLET AT BEDTIME (STOP VERAPAMIL)    Mikey Kirschner, MD  aspirin EC 81 MG tablet Take 81 mg by mouth daily.    Historical Provider, MD  benzonatate (TESSALON) 200 MG capsule TAKE ONE CAPSULE BY MOUTH THREE TIMES DAILY AS NEEDED FOR COUGH    Baird Cancer, PA-C  Cholecalciferol 1000 UNITS capsule Take 1,000 Units by mouth daily.    Historical Provider, MD  enalapril (VASOTEC) 20 MG tablet take 1 tablet by mouth once daily 08/02/13   Mikey Kirschner, MD  esomeprazole (NEXIUM) 40 MG capsule Take 40 mg by mouth daily at 12 noon.    Historical Provider, MD  fenofibrate 160 MG tablet take 1 tablet by mouth once daily with food    Mikey Kirschner, MD  ferrous sulfate (FERROUSUL) 325 (65 FE) MG tablet Take 1 tablet (325 mg total) by mouth 2 (two) times daily after a meal. 03/19/13   Rogene Houston, MD  furosemide (LASIX) 20 MG tablet Take 1 tablet (20 mg total) by mouth daily. 12/02/12   Mikey Kirschner, MD  guaifenesin (ROBITUSSIN) 100 MG/5ML syrup Take 200 mg by mouth 3 (three) times daily as needed for cough.    Historical Provider, MD  hydrALAZINE (APRESOLINE) 25 MG tablet take 1 tablet by mouth three times a day    Mikey Kirschner, MD  HYDROcodone-acetaminophen (NORCO) 10-325 MG per tablet Take 1 tablet by mouth every 6 (six) hours as needed.    Historical Provider, MD  hydrOXYzine (ATARAX/VISTARIL) 25 MG tablet Take 1 tablet (25 mg total) by mouth 3 (three) times daily as needed for itching. 12/02/12   Mikey Kirschner, MD  levothyroxine (SYNTHROID, LEVOTHROID) 137 MCG tablet Take 137 mcg by mouth daily before breakfast.    Historical Provider, MD  lidocaine-prilocaine (EMLA) cream Apply a quarter size amount to port site 1 hour prior to chemo. Do not rub in. Cover with plastic wrap. 07/29/13   Farrel Gobble, MD  loperamide (IMODIUM) 2 MG  capsule Take 2 mg by mouth 4 (four) times daily as needed for diarrhea or loose stools.    Historical Provider, MD  magnesium oxide (MAG-OX) 400 MG tablet Take 750 mg by mouth daily.     Historical Provider, MD  metoCLOPramide (REGLAN) 5 MG tablet Starting the day after chemo, take 1 tablet four times a day x 48 hours. Then may take 1 tablet four times a day IF needed for nausea/vomiting. 07/29/13   Farrel Gobble, MD  ondansetron (ZOFRAN) 8 MG tablet TAKE ONE TABLET BY MOUTH EVERY 8 HOURS AS NEEDED FOR NAUSEA    Grace Bushy  Wolfgang Phoenix, MD  PARoxetine (PAXIL) 30 MG tablet take 1 tablet by mouth once daily    Mikey Kirschner, MD  potassium chloride SA (K-DUR,KLOR-CON) 20 MEQ tablet Take 1 tablet (20 mEq total) by mouth 2 (two) times daily. 08/15/13   Baird Cancer, PA-C  pravastatin (PRAVACHOL) 20 MG tablet Take 20 mg by mouth at bedtime.    Historical Provider, MD  prochlorperazine (COMPAZINE) 10 MG tablet Starting the day after chemo, take 1 tablet four times a day x 48 hours. Then may take 1 tablet four times a day IF needed for nausea/vomiting. 07/29/13   Farrel Gobble, MD  temazepam (RESTORIL) 15 MG capsule Take 1 capsule (15 mg total) by mouth at bedtime as needed for sleep. 08/29/13   Farrel Gobble, MD  traMADol (ULTRAM) 50 MG tablet Take 1 tablet (50 mg total) by mouth every 6 (six) hours as needed. 06/17/13   Mikey Kirschner, MD  zolpidem (AMBIEN) 10 MG tablet Take 10 mg by mouth at bedtime as needed for sleep.    Historical Provider, MD   Physical Exam: Filed Vitals:   09/22/13 1130  BP: 141/63  Pulse: 83  Temp:   Resp:     BP 141/63  Pulse 83  Temp(Src) 97.7 F (36.5 C) (Oral)  Resp 16  SpO2 100%  General:  Appears calm and comfortable somewhat weak at the Eyes: PERRL, normal lids, irises & conjunctiva ENT: grossly normal hearing, lips & tongue because membranes of her mouth are pink slightly pale no evidence of thrush Neck: no LAD, masses or thyromegaly Cardiovascular: RRR,  no m/r/g. No LE edema. Telemetry: SR, no arrhythmias  Respiratory: CTA bilaterally, no w/r/r. Normal respiratory effort. Abdomen: soft, ntnd +BS Skin: Anterior chest with small amount radiation burn substernal area Musculoskeletal: grossly normal tone BUE/BLE Psychiatric: grossly normal mood and affect, speech fluent and appropriate Neurologic: grossly non-focal.          Labs on Admission:  Basic Metabolic Panel:  Recent Labs Lab 09/22/13 1009  NA 136*  K 5.1  CL 101  CO2 23  GLUCOSE 112*  BUN 34*  CREATININE 3.22*  CALCIUM 10.7*   Liver Function Tests:  Recent Labs Lab 09/22/13 1009  AST 30  ALT 16  ALKPHOS 39  BILITOT 0.4  PROT 7.5  ALBUMIN 3.8   No results found for this basename: LIPASE, AMYLASE,  in the last 168 hours No results found for this basename: AMMONIA,  in the last 168 hours CBC:  Recent Labs Lab 09/22/13 1009  WBC 1.4*  NEUTROABS 0.9*  HGB 8.9*  HCT 26.3*  MCV 99.6  PLT 210   Cardiac Enzymes:  Recent Labs Lab 09/22/13 1059  TROPONINI <0.30    BNP (last 3 results) No results found for this basename: PROBNP,  in the last 8760 hours CBG: No results found for this basename: GLUCAP,  in the last 168 hours  Radiological Exams on Admission: Dg Chest 2 View  09/22/2013   CLINICAL DATA:  Fall, weakness.  History of lung cancer.  EXAM: CHEST  2 VIEW  COMPARISON:  CT chest 06/25/2013. Single view of the chest 07/16/2013.  FINDINGS: The lungs are emphysematous. Cavitary left lower lobe mass is identified as seen on the prior CT. No consolidative process, pneumothorax or effusion is identified. Heart size is mildly enlarged. Port-A-Cath is noted.  IMPRESSION: No acute abnormality.  Cavitary left lower lobe mass.  Emphysema.   Electronically Signed   By: Inge Rise M.D.  On: 09/22/2013 10:40   Ct Head Wo Contrast  09/22/2013   CLINICAL DATA:  Fall.  Altered mental status.  EXAM: CT HEAD WITHOUT CONTRAST  TECHNIQUE: Contiguous axial  images were obtained from the base of the skull through the vertex without intravenous contrast.  COMPARISON:  08/27/2013 MR.  FINDINGS: No skull fracture or intracranial hemorrhage.  Mild atrophy without hydrocephalus.  Small vessel disease type changes without CT evidence of large acute infarct.  No intracranial mass lesion noted on this unenhanced exam.  Visualized orbital structures unremarkable.  IMPRESSION: No skull fracture or intracranial hemorrhage.  Please see above.   Electronically Signed   By: Chauncey Cruel M.D.   On: 09/22/2013 10:54    EKG:   Assessment/Plan Principal Problem:   AKI (acute kidney injury): In setting of some chronic renal insufficiency. Will admit and provide IV fluids. Will hold Lasix and enalapril. Will monitor intake and output. Recheck in the a.m.  Active Problems: Odynophagia: Likely related to radiation therapy versus fungal infection versus herpes. Will continue Carafate and provide empiric fluconazole. Will request GI consult for possible endoscopy for further evaluation. Pain medication  Dehydration: Related to #2. Will provide IV fluids gently. Will treat #2 to encourage by mouth intake. Will recheck in the a.m. pain orthostatics    Coronary artery disease: No chest pain. Will continue home medications    Essential hypertension: Controlled in the emergency department. Will hold her Lasix and enalapril. Will continue amlodipine and hydralazine.    Hyperlipidemia: Continue home   Peripheral arterial disease: Stable at baseline   Anemia, iron deficiency: Stable at baseline   Squamous cell carcinoma of left lung: Patient seen by oncology March 20. Plan was to complete radiation tomorrow. Chart review indicates scheduling a restaging scan in 4 weeks for further evaluation.         COPD, moderate: Not on home oxygen. Stable at baseline.   GI Code Status: DNR Family Communication: daughter at bedside Disposition Plan: home when ready  Time spent: 85  minutes  [facility] Pager [phone]

## 2013-09-23 ENCOUNTER — Other Ambulatory Visit (HOSPITAL_COMMUNITY): Payer: Medicare Other

## 2013-09-23 DIAGNOSIS — E43 Unspecified severe protein-calorie malnutrition: Secondary | ICD-10-CM | POA: Insufficient documentation

## 2013-09-23 DIAGNOSIS — A09 Infectious gastroenteritis and colitis, unspecified: Secondary | ICD-10-CM | POA: Diagnosis not present

## 2013-09-23 DIAGNOSIS — K5289 Other specified noninfective gastroenteritis and colitis: Secondary | ICD-10-CM | POA: Diagnosis not present

## 2013-09-23 DIAGNOSIS — D509 Iron deficiency anemia, unspecified: Secondary | ICD-10-CM | POA: Diagnosis not present

## 2013-09-23 DIAGNOSIS — R109 Unspecified abdominal pain: Secondary | ICD-10-CM

## 2013-09-23 DIAGNOSIS — R112 Nausea with vomiting, unspecified: Secondary | ICD-10-CM | POA: Diagnosis not present

## 2013-09-23 DIAGNOSIS — E860 Dehydration: Secondary | ICD-10-CM | POA: Diagnosis not present

## 2013-09-23 DIAGNOSIS — N179 Acute kidney failure, unspecified: Secondary | ICD-10-CM | POA: Diagnosis not present

## 2013-09-23 DIAGNOSIS — R197 Diarrhea, unspecified: Secondary | ICD-10-CM

## 2013-09-23 DIAGNOSIS — N189 Chronic kidney disease, unspecified: Secondary | ICD-10-CM | POA: Diagnosis not present

## 2013-09-23 LAB — CBC
HCT: 25.1 % — ABNORMAL LOW (ref 36.0–46.0)
Hemoglobin: 8.4 g/dL — ABNORMAL LOW (ref 12.0–15.0)
MCH: 33.2 pg (ref 26.0–34.0)
MCHC: 33.5 g/dL (ref 30.0–36.0)
MCV: 99.2 fL (ref 78.0–100.0)
Platelets: 202 10*3/uL (ref 150–400)
RBC: 2.53 MIL/uL — AB (ref 3.87–5.11)
RDW: 16.4 % — AB (ref 11.5–15.5)
WBC: 1.2 10*3/uL — CL (ref 4.0–10.5)

## 2013-09-23 LAB — BASIC METABOLIC PANEL
BUN: 24 mg/dL — ABNORMAL HIGH (ref 6–23)
CO2: 19 meq/L (ref 19–32)
CREATININE: 2.22 mg/dL — AB (ref 0.50–1.10)
Calcium: 9.9 mg/dL (ref 8.4–10.5)
Chloride: 104 mEq/L (ref 96–112)
GFR, EST AFRICAN AMERICAN: 24 mL/min — AB (ref >90)
GFR, EST NON AFRICAN AMERICAN: 20 mL/min — AB (ref >90)
Glucose, Bld: 86 mg/dL (ref 70–99)
Potassium: 4.4 mEq/L (ref 3.7–5.3)
SODIUM: 138 meq/L (ref 137–147)

## 2013-09-23 MED ORDER — ALPRAZOLAM 0.5 MG PO TABS
0.5000 mg | ORAL_TABLET | Freq: Two times a day (BID) | ORAL | Status: DC
  Administered 2013-09-23 – 2013-09-26 (×7): 0.5 mg via ORAL
  Filled 2013-09-23 (×7): qty 1

## 2013-09-23 MED ORDER — BOOST / RESOURCE BREEZE PO LIQD
1.0000 | Freq: Three times a day (TID) | ORAL | Status: DC
  Administered 2013-09-24 – 2013-09-26 (×6): 1 via ORAL

## 2013-09-23 NOTE — Progress Notes (Signed)
[facility] PROGRESS NOTE  [name] MVH:[id number] DOB: 1934-11-17 DOA: 09/22/2013 PCP: [name], MD   Summary: 79 year old with stage 3B lung CA on current chemo/XRT admitted with weakness, dysphagia/odynophagia, dizziness unable to tolerate po and AKI on CKD.  Assessment/Plan: Principal Problem:  AKI (acute kidney injury): In setting of some chronic renal insufficiency. Improving with gently IV fluids. Continue to  hold Lasix and enalapril. Urine output good. Monitor   Active Problems:  Odynophagia: Likely related to radiation therapy versus fungal infection versus herpes. No improvement this am.  Will continue Carafate and provide empiric fluconazole day #2. Await GI consult for possible endoscopy for further evaluation. Pain medication   Dehydration: Related to #2. Improved. Continue  IV fluids gently. Will treat #2 to encourage by mouth intake. Not orthostatic. Will change diet to full liquid to encourage po intake.   Coronary artery disease: No chest pain. Will continue home medications   Essential hypertension: Controlled. Continue to Lasix and enalapril. Will continue amlodipine and hydralazine.   Hyperlipidemia: Continue home medication  Peripheral arterial disease: Stable at baseline   Anemia, iron deficiency: Stable at baseline  Squamous cell carcinoma of left lung: Patient seen by oncology March 20. Plan was to complete radiation tomorrow. Chart review indicates scheduling a restaging scan in 4 weeks for further evaluation.  COPD, moderate: Not on home oxygen. Stable at baseline.    Code Status: full Family Communication: daughter at bedside Disposition Plan: home when ready   Consultants:  Dr. Laural Golden  Procedures:  none  Antibiotics:  Fluconazole 09/22/13>>  HPI/Subjective: Sitting on side of bed. Reports continued pain with swallowing and not eating.   Objective: Filed Vitals:   09/23/13 0442  BP: 115/55  Pulse: 99  Temp: 98.7 F (37.1  C)  Resp: 18    Intake/Output Summary (Last 24 hours) at 09/23/13 1143 Last data filed at 09/23/13 0945  Gross per 24 hour  Intake 696.67 ml  Output      1 ml  Net 695.67 ml   Filed Weights   09/22/13 1317 09/23/13 0442  Weight: 66.906 kg (147 lb 8 oz) 69.899 kg (154 lb 1.6 oz)    Exam:   General:  NAD  Cardiovascular: RRR no MGR No LE edema  Respiratory: normal effort BS clear bilaterally no wheeze  Abdomen: soft +BS non-distended non tender  Musculoskeletal: no clubbing or cyanosis   Data Reviewed: Basic Metabolic Panel:  Recent Labs Lab 09/22/13 1009 09/23/13 0532  NA 136* 138  K 5.1 4.4  CL 101 104  CO2 23 19  GLUCOSE 112* 86  BUN 34* 24*  CREATININE 3.22* 2.22*  CALCIUM 10.7* 9.9   Liver Function Tests:  Recent Labs Lab 09/22/13 1009  AST 30  ALT 16  ALKPHOS 39  BILITOT 0.4  PROT 7.5  ALBUMIN 3.8   No results found for this basename: LIPASE, AMYLASE,  in the last 168 hours No results found for this basename: AMMONIA,  in the last 168 hours CBC:  Recent Labs Lab 09/22/13 1009 09/23/13 0532  WBC 1.4* 1.2*  NEUTROABS 0.9*  --   HGB 8.9* 8.4*  HCT 26.3* 25.1*  MCV 99.6 99.2  PLT 210 202   Cardiac Enzymes:  Recent Labs Lab 09/22/13 1059  TROPONINI <0.30   BNP (last 3 results) No results found for this basename: PROBNP,  in the last 8760 hours CBG: No results found for this basename: GLUCAP,  in the last 168 hours  No results  found for this or any previous visit (from the past 240 hour(s)).   Studies: Dg Chest 2 View  09/22/2013   CLINICAL DATA:  Fall, weakness.  History of lung cancer.  EXAM: CHEST  2 VIEW  COMPARISON:  CT chest 06/25/2013. Single view of the chest 07/16/2013.  FINDINGS: The lungs are emphysematous. Cavitary left lower lobe mass is identified as seen on the prior CT. No consolidative process, pneumothorax or effusion is identified. Heart size is mildly enlarged. Port-A-Cath is noted.  IMPRESSION: No acute  abnormality.  Cavitary left lower lobe mass.  Emphysema.   Electronically Signed   By: Inge Rise M.D.   On: 09/22/2013 10:40   Ct Head Wo Contrast  09/22/2013   CLINICAL DATA:  Fall.  Altered mental status.  EXAM: CT HEAD WITHOUT CONTRAST  TECHNIQUE: Contiguous axial images were obtained from the base of the skull through the vertex without intravenous contrast.  COMPARISON:  08/27/2013 MR.  FINDINGS: No skull fracture or intracranial hemorrhage.  Mild atrophy without hydrocephalus.  Small vessel disease type changes without CT evidence of large acute infarct.  No intracranial mass lesion noted on this unenhanced exam.  Visualized orbital structures unremarkable.  IMPRESSION: No skull fracture or intracranial hemorrhage.  Please see above.   Electronically Signed   By: Chauncey Cruel M.D.   On: 09/22/2013 10:54    Scheduled Meds: . allopurinol  100 mg Oral Daily  . ALPRAZolam  0.5 mg Oral Q12H  . amLODipine  5 mg Oral Daily  . aspirin EC  81 mg Oral Daily  . enoxaparin (LOVENOX) injection  30 mg Subcutaneous Q24H  . fenofibrate  160 mg Oral Daily  . fluconazole (DIFLUCAN) IV  100 mg Intravenous Q24H  . hydrALAZINE  25 mg Oral TID  . levothyroxine  137 mcg Oral QAC breakfast  . magnesium oxide  800 mg Oral Daily  . pantoprazole  40 mg Oral BID  . PARoxetine  30 mg Oral Daily  . sucralfate  1 g Oral TID WC & HS   Continuous Infusions: . sodium chloride 100 mL/hr at 09/23/13 1135    Principal Problem:   AKI (acute kidney injury) Active Problems:   Coronary artery disease   Essential hypertension   Hyperlipidemia   Peripheral arterial disease   Anemia, iron deficiency   Squamous cell carcinoma of left lung   Dehydration   Odynophagia   COPD, moderate    Time spent: [facility] Pager [phone]. If 7PM-7AM, please contact night-coverage at [email or web address], password [facility] 09/23/2013, 11:43 AM  LOS: 1 day

## 2013-09-23 NOTE — Care Management Note (Signed)
    Page 1 of 1   09/23/2013     1:48:13 PM   CARE MANAGEMENT NOTE 09/23/2013  Patient:  [name], [name]   Account Number:  [id number]  Date Initiated:  09/23/2013  Documentation initiated by:  Theophilus Kinds  Subjective/Objective Assessment:   Pt admitted from home with AKI. Pt lives alone and has 2 daughters that are very active in the care of the pt. Pt has been independent with ADL's.     Action/Plan:   No CM needs noted at this time.   Anticipated DC Date:  09/26/2013   Anticipated DC Plan:  [place]  CM consult      Choice offered to / List presented to:             Status of service:  Completed, signed off Medicare Important Message given?   (If response is "NO", the following Medicare IM given date fields will be blank) Date Medicare IM given:   Date Additional Medicare IM given:    Discharge Disposition:  HOME/SELF CARE  Per UR Regulation:    If discussed at Long Length of Stay Meetings, dates discussed:    Comments:  09/23/13 [place], RN BSN CM

## 2013-09-23 NOTE — Progress Notes (Addendum)
Pt seen and examined, agree with above note per Dyanne Carrel NP Continue IVF, AKI improving Continues to have severe odynophagia, GI eval pending, D/D Radiation esophagitis vs Candida vs Herpes Continue PPI, Carafate, fluconazole  Addendum: called by RN stating Pt and family want to be Full Code now instead of DNR  Domenic Polite, MD [phone]

## 2013-09-23 NOTE — Consult Note (Signed)
Referring Provider: No ref. provider found Primary Care Physician:  [name], MD Primary Gastroenterologist:  Dr. Laural Golden  Reason for Consultation:  [name].   HPI:   Patient is 79 year old African female who was admitted to Dr. Delene Loll service via emergency room yesterday where she was brought by family member after she fell at home. 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 She has not felt well for the last 3 weeks. She began to have swallowing difficulty about 2 weeks ago and has been progressive ever since. She has been losing weight. She lost her appetite and had difficulty with liquids. Her daughter states that in the last 2 months she has lost 20-22 pounds. She has not experienced vomiting hematemesis melena or rectal bleeding. She was getting ready to go [facility] for radiation therapy when she fell. She only had 2 treatments left. On initial evaluation she was felt to be dehydrated. She also had unenhanced head CT which was negative for acute problems. She was admitted to regular floor in placed on neutropenic precautions. She is on full liquid diet and only taking sips at a time. She points to her lower sternal area site of burning pain and food bolus obstruction. Food bolus eventually passes down. She denies cough shortness of breath or sore throat. She also complains of burning and left infrascapular area where she has developed a rash secondary to radiation therapy. GI history is significant for subtotal colectomy for history of diverticulitis in 2005. She underwent EGD and lexical sigmoidoscopy because of chronic diarrhea and iron deficiency anemia. EGD revealed incidental finding of small AVM in the esophagus gastric scar and erosions. H. pylori serology was negative. Due to biopsy was negative for celiac disease and she is single small tubular adenoma removed. Her anemia  corrected with oral iron therapy.   Past Medical History  Diagnosis Date  . Hypertension   . Hypothyroidism   . Hyperlipidemia   . Insomnia   . GERD (gastroesophageal reflux disease)     chronic gastritis  . Adrenal adenoma   . Chronic diarrhea   . Fatty liver   . Arthritis     knee  . QT prolongation     syncope  . Coronary artery disease   . Impaired fasting glucose   . Renal insufficiency     low protien diet  . Tobacco use     1/2 ppd, approx 25-50 pack years (as of 08/2012)  . Family history of heart disease   . IBS (irritable bowel syndrome)   . Peripheral arterial disease     sstatus post  infrarenal abdominal aortic tube graft placed by Dr. Victorino Dike February 2008  . Cancer     Lung  . Squamous cell carcinoma of left lung 07/29/2013    Past Surgical History  Procedure Laterality Date  . Abdominal hysterectomy    . Thyroidectomy, partial    . Colonoscopy  5/04    normal  . Abdominal aortic aneurysm repair  07/2006    D. J.D. Kellie Simmering  . Cataract extraction Bilateral 2010  . Cardiac catheterization  02/2010    mod CAD in L-dominant system with normal LV function  . Colon resection  2005    1/2 colon removed  . Transthoracic echocardiogram  02/2010    EF 55-60%; mild conc LVH, normal systolic function; mildly calcified AV annulus  . Colonoscopy with esophagogastroduodenoscopy (egd) N/A 03/19/2013  Procedure: COLONOSCOPY WITH ESOPHAGOGASTRODUODENOSCOPY (EGD);  Surgeon: Rogene Houston, MD;  Location: [facility];  Service: Endoscopy;  Laterality: N/A;  145  . Dg biopsy lung Left Jan 2015    Prior to Admission medications   Medication Sig Start Date End Date Taking? Authorizing Provider  allopurinol (ZYLOPRIM) 300 MG tablet Take 300 mg by mouth daily.   Yes Historical Provider, MD  ALPRAZolam Duanne Moron) 1 MG tablet TAKE ONE HALF TABLET BY MOUTH 4 TIMES DAILY AS NEEDED 07/18/13  Yes Mikey Kirschner, MD  amLODipine (NORVASC) 10 MG tablet TAKE ONE TABLET AT BEDTIME  (STOP VERAPAMIL)   Yes Mikey Kirschner, MD  aspirin EC 81 MG tablet Take 81 mg by mouth daily.   Yes Historical Provider, MD  chlorpheniramine-HYDROcodone (TUSSIONEX) 10-8 MG/5ML LQCR Take 5 mLs by mouth every 12 (twelve) hours as needed for cough.   Yes Historical Provider, MD  Cholecalciferol 1000 UNITS capsule Take 1,000 Units by mouth daily.   Yes Historical Provider, MD  enalapril (VASOTEC) 20 MG tablet take 1 tablet by mouth once daily 08/02/13  Yes Mikey Kirschner, MD  esomeprazole (NEXIUM) 40 MG capsule Take 40 mg by mouth daily at 12 noon.   Yes Historical Provider, MD  fenofibrate 160 MG tablet take 1 tablet by mouth once daily with food   Yes Mikey Kirschner, MD  ferrous sulfate (FERROUSUL) 325 (65 FE) MG tablet Take 1 tablet (325 mg total) by mouth 2 (two) times daily after a meal. 03/19/13  Yes Rogene Houston, MD  furosemide (LASIX) 20 MG tablet Take 1 tablet (20 mg total) by mouth daily. 12/02/12  Yes Mikey Kirschner, MD  hydrALAZINE (APRESOLINE) 25 MG tablet take 1 tablet by mouth three times a day   Yes Mikey Kirschner, MD  HYDROcodone-acetaminophen (NORCO) 10-325 MG per tablet Take 1 tablet by mouth every 6 (six) hours as needed for moderate pain.    Yes Historical Provider, MD  hydrOXYzine (ATARAX/VISTARIL) 25 MG tablet Take 1 tablet (25 mg total) by mouth 3 (three) times daily as needed for itching. 12/02/12  Yes Mikey Kirschner, MD  levothyroxine (SYNTHROID, LEVOTHROID) 137 MCG tablet Take 137 mcg by mouth daily before breakfast.   Yes Historical Provider, MD  loperamide (IMODIUM) 2 MG capsule Take 2 mg by mouth 4 (four) times daily as needed for diarrhea or loose stools.   Yes Historical Provider, MD  loratadine (CLARITIN) 10 MG tablet Take 10 mg by mouth daily as needed for allergies.   Yes Historical Provider, MD  magnesium oxide (MAG-OX) 400 MG tablet Take 750 mg by mouth daily.    Yes Historical Provider, MD  metoCLOPramide (REGLAN) 5 MG tablet Starting the day after chemo,  take 1 tablet four times a day x 48 hours. Then may take 1 tablet four times a day IF needed for nausea/vomiting. 07/29/13  Yes Farrel Gobble, MD  ondansetron (ZOFRAN) 8 MG tablet TAKE ONE TABLET BY MOUTH EVERY 8 HOURS AS NEEDED FOR NAUSEA   Yes Mikey Kirschner, MD  PARoxetine (PAXIL) 30 MG tablet take 1 tablet by mouth once daily   Yes Mikey Kirschner, MD  potassium chloride SA (K-DUR,KLOR-CON) 20 MEQ tablet Take 1 tablet (20 mEq total) by mouth 2 (two) times daily. 08/15/13  Yes Manon Hilding Kefalas, PA-C  pravastatin (PRAVACHOL) 20 MG tablet Take 20 mg by mouth at bedtime.   Yes Historical Provider, MD  prochlorperazine (COMPAZINE) 10 MG tablet Starting the day after  chemo, take 1 tablet four times a day x 48 hours. Then may take 1 tablet four times a day IF needed for nausea/vomiting. 07/29/13  Yes Farrel Gobble, MD  sucralfate (CARAFATE) 1 GM/10ML suspension Take 1 g by mouth 4 (four) times daily -  with meals and at bedtime.   Yes Historical Provider, MD  temazepam (RESTORIL) 15 MG capsule Take 1 capsule (15 mg total) by mouth at bedtime as needed for sleep. 08/29/13  Yes Farrel Gobble, MD  traMADol (ULTRAM) 50 MG tablet Take 1 tablet (50 mg total) by mouth every 6 (six) hours as needed. 06/17/13  Yes Mikey Kirschner, MD    Current Facility-Administered Medications  Medication Dose Route Frequency Provider Last Rate Last Dose  . 0.9 %  sodium chloride infusion   Intravenous Continuous Radene Gunning, NP 75 mL/hr at 09/23/13 1520    . acetaminophen (TYLENOL) tablet 650 mg  650 mg Oral Q6H PRN Domenic Polite, MD       Or  . acetaminophen (TYLENOL) suppository 650 mg  650 mg Rectal Q6H PRN Domenic Polite, MD      . allopurinol (ZYLOPRIM) tablet 100 mg  100 mg Oral Daily Domenic Polite, MD   100 mg at 09/23/13 1140  . ALPRAZolam Duanne Moron) tablet 0.5 mg  0.5 mg Oral Q12H Lezlie Octave Black, NP   0.5 mg at 09/23/13 1140  . amLODipine (NORVASC) tablet 5 mg  5 mg Oral Daily Domenic Polite, MD   5 mg at  09/23/13 1134  . aspirin EC tablet 81 mg  81 mg Oral Daily Domenic Polite, MD   81 mg at 09/23/13 1134  . benzonatate (TESSALON) capsule 200 mg  200 mg Oral Q4H PRN Domenic Polite, MD      . enoxaparin (LOVENOX) injection 30 mg  30 mg Subcutaneous Q24H Domenic Polite, MD   30 mg at 09/23/13 1521  . feeding supplement (RESOURCE BREEZE) (RESOURCE BREEZE) liquid 1 Container  1 Container Oral TID BM Jenifer A Williams, RD      . fenofibrate tablet 160 mg  160 mg Oral Daily Domenic Polite, MD   160 mg at 09/23/13 1520  . fluconazole (DIFLUCAN) IVPB 100 mg  100 mg Intravenous Q24H Domenic Polite, MD   100 mg at 09/23/13 1521  . hydrALAZINE (APRESOLINE) tablet 25 mg  25 mg Oral TID Domenic Polite, MD   25 mg at 09/23/13 1519  . HYDROcodone-acetaminophen (NORCO) 10-325 MG per tablet 1 tablet  1 tablet Oral Q6H PRN Domenic Polite, MD      . levothyroxine (SYNTHROID, LEVOTHROID) tablet 137 mcg  137 mcg Oral QAC breakfast Domenic Polite, MD   137 mcg at 09/23/13 [phone]  . magnesium oxide (MAG-OX) tablet 800 mg  800 mg Oral Daily Domenic Polite, MD   800 mg at 09/23/13 1133  . ondansetron (ZOFRAN) tablet 4 mg  4 mg Oral Q6H PRN Domenic Polite, MD       Or  . ondansetron [facility]) injection 4 mg  4 mg Intravenous Q6H PRN Domenic Polite, MD      . pantoprazole (PROTONIX) EC tablet 40 mg  40 mg Oral BID Domenic Polite, MD   40 mg at 09/23/13 1133  . PARoxetine (PAXIL) tablet 30 mg  30 mg Oral Daily Domenic Polite, MD   30 mg at 09/23/13 1133  . sucralfate (CARAFATE) tablet 1 g  1 g Oral TID WC & HS Domenic Polite, MD   1 g at 09/23/13 1140  .  temazepam (RESTORIL) capsule 15 mg  15 mg Oral QHS PRN Domenic Polite, MD      . traMADol Veatrice Bourbon) tablet 50 mg  50 mg Oral Q6H PRN Domenic Polite, MD      . zolpidem (AMBIEN) tablet 5 mg  5 mg Oral QHS PRN Domenic Polite, MD        Allergies as of 09/22/2013 - Review Complete 09/22/2013  Allergen Reaction Noted  . Aleve [naproxen sodium] Other (See Comments) 09/10/2012   . Dilaudid [hydromorphone hcl]  09/07/2012  . Dyazide [hydrochlorothiazide w-triamterene] Other (See Comments) 09/07/2012  . Zithromax [azithromycin] Itching 09/07/2012    Family History  Problem Relation Age of Onset  . Hypertension Mother   . Diabetes Mother   . Heart attack Mother   . Hyperlipidemia Sister     x3 sister  . Kidney disease Brother     History   Social History  . Marital Status: Divorced    Spouse Name: N/A    Number of Children: 2  . Years of Education: N/A   Occupational History  .     Social History Main Topics  . Smoking status: Current Every Day Smoker -- 0.50 packs/day for 59 years    Types: Cigarettes  . Smokeless tobacco: Never Used     Comment: smoking since age [age]  . Alcohol Use: No  . Drug Use: No  . Sexual Activity: Not on file   Other Topics Concern  . Not on file   Social History Narrative  . No narrative on file    Review of Systems: See HPI, otherwise normal ROS  Physical Exam: Temp:  [98.2 F (36.8 C)-98.7 F (37.1 C)] 98.2 F (36.8 C) (03/31 1421) Pulse Rate:  [96-102] 102 (03/31 1421) Resp:  [18-20] 20 (03/31 1421) BP: (115-126)/(55-69) 126/69 mmHg (03/31 1421) SpO2:  [97 %-100 %] 100 % (03/31 1421) Weight:  [154 lb 1.6 oz (69.899 kg)] 154 lb 1.6 oz (69.899 kg) (03/31 0442) Patient is alert and in no acute distress.  Conjunctivae is pale. Sclera is nonicteric. Oropharyngeal mucosa is unremarkable and she is edentulous. No neck masses or thyromegaly noted. Cardiac exam with a regular rhythm normal S1 and S2. No murmur or gallop noted. Lungs are clear to auscultation. Abdomen symmetrical soft and nontender without organomegaly or masses. No peripheral edema or clubbing noted. She has a rash in the left infrascapular region with erythema and brownish discoloration.  Lab Results:  Recent Labs  09/22/13 1009 09/23/13 0532  WBC 1.4* 1.2*  HGB 8.9* 8.4*  HCT 26.3* 25.1*  PLT 210 202   BMET  Recent Labs   09/22/13 1009 09/23/13 0532  NA 136* 138  K 5.1 4.4  CL 101 104  CO2 23 19  GLUCOSE 112* 86  BUN 34* 24*  CREATININE 3.22* 2.22*  CALCIUM 10.7* 9.9   LFT  Recent Labs  09/22/13 1009  PROT 7.5  ALBUMIN 3.8  AST 30  ALT 16  ALKPHOS 39  BILITOT 0.4   PT/INR  Recent Labs  09/22/13 1009  LABPROT 13.4  INR 1.04   Hepatitis Panel No results found for this basename: HEPBSAG, HCVAB, HEPAIGM, HEPBIGM,  in the last 72 hours  Studies/Results: Dg Chest 2 View  09/22/2013   CLINICAL DATA:  Fall, weakness.  History of lung cancer.  EXAM: CHEST  2 VIEW  COMPARISON:  CT chest 06/25/2013. Single view of the chest 07/16/2013.  FINDINGS: The lungs are emphysematous. Cavitary left lower lobe mass is  identified as seen on the prior CT. No consolidative process, pneumothorax or effusion is identified. Heart size is mildly enlarged. Port-A-Cath is noted.  IMPRESSION: No acute abnormality.  Cavitary left lower lobe mass.  Emphysema.   Electronically Signed   By: Inge Rise M.D.   On: 09/22/2013 10:40   Ct Head Wo Contrast  09/22/2013   CLINICAL DATA:  Fall.  Altered mental status.  EXAM: CT HEAD WITHOUT CONTRAST  TECHNIQUE: Contiguous axial images were obtained from the base of the skull through the vertex without intravenous contrast.  COMPARISON:  08/27/2013 MR.  FINDINGS: No skull fracture or intracranial hemorrhage.  Mild atrophy without hydrocephalus.  Small vessel disease type changes without CT evidence of large acute infarct.  No intracranial mass lesion noted on this unenhanced exam.  Visualized orbital structures unremarkable.  IMPRESSION: No skull fracture or intracranial hemorrhage.  Please see above.   Electronically Signed   By: Chauncey Cruel M.D.   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 Patient had normal esophageal exam when she had EGD in September 2014. Acute symptoms should improve with supportive therapy. She is  at risk for esophageal stricture down the road.  If acute symptoms do not improve over the next 48 hours will consider esophagogram.  Neutropenia and anemia secondary to chemotherapy. No evidence of overt GI bleed.  Recommendations; Continue full liquid diet, pantoprazole and sucralfate. Will reevaluate patient in a.m.   LOS: 1 day   Gwenda Heiner U  09/23/2013, 5:34 PM

## 2013-09-23 NOTE — Progress Notes (Signed)
Patient and family would like to address patient's code status, Dr [name] notified. Family at bedside.

## 2013-09-23 NOTE — Progress Notes (Signed)
INITIAL NUTRITION ASSESSMENT  DOCUMENTATION CODES Per approved criteria  -Severe malnutrition in the context of chronic illness   Pt meets criteria for severe MALNUTRITION in the context of chronic illness as evidenced by <75% estimated energy needs x 1 month, 7.8% wt loss x 1 month.  INTERVENTION: Resource Breeze po TID, each supplement provides 250 kcal and 9 grams of protein Magic cup TID with meals, each supplement provides 290 kcal and 9 grams of protein.  NUTRITION DIAGNOSIS: Inadequate oral intake related to painful swallowing as evidenced by PO: 50%, 7.8% wt loss x 1 month.   Goal: Pt will meet >90% of estimated nutritional needs  Monitor:  PO intake, labs, skin assessments, weight changes, I/O's  Reason for Assessment: MST=3  79 y.o. female  Admitting Dx: AKI (acute kidney injury)  ASSESSMENT: Pt admitted for dehydrate and neutropenia. She complains of a very poor appetite.  Pt daughter reports that pt's nutritional status has been declining for the past 6 weeks, since she started chemo and radiation. Pt with dx of stage IIIB squamous cell carcinoma of the lung. She reports that she just recently completed chemotherapy and today was her last scheduled radiation treatment. Due to the side effects of radiation, pt daughter reports pt with difficulty swallowing. She reports pt does have the desire to eat, but because it is so painful, pt will consume food only to take her medications. Medicines have been put in applesauce during admission and daughter reports pt is taking much better now. Pt has dentures and daughter reports that pt can chew well with them.  Pt appetite has been extremely poor over the past 2-3 weeks, per daughter's reports and she reveals that pt has lost approximately 20# since starting cancer treatments 6 weeks ago (11.5% wt loss, which is clinically significant). Documented weight hx also reveals a 9.4% wt loss x 1 year, 11% wt loss x 6 months (which is  clinically significant), 10% wt loss x 3 months (which is clinically significant), and 7.8% wt loss x 1 week (which is clinically significant). GI consult pending for painful swallowing,  Nutritional supplements have been tried at home (Boost and Ensure), but pt dislikes the milky texture of them. She is agreeable to trying resource breeze and magic cup.   Nutrition Focused Physical Exam: Subcutaneous Fat:  Orbital Region: WDL Upper Arm Region: moderate depletion Thoracic and Lumbar Region: WDL  Muscle:  Temple Region: WDL Clavicle Bone Region: WDL Clavicle and Acromion Bone Region: WDL Scapular Bone Region: WDL Dorsal Hand: moderate depletion Patellar Region: moderate depletion Anterior Thigh Region: moderate depletion Posterior Calf Region: moderate depletion  Edema: none present   Height: Ht Readings from Last 1 Encounters:  09/22/13 5\' 7"  (1.702 m)    Weight: Wt Readings from Last 1 Encounters:  09/23/13 154 lb 1.6 oz (69.899 kg)    Ideal Body Weight: 135#  % Ideal Body Weight: 114%  Wt Readings from Last 10 Encounters:  09/23/13 154 lb 1.6 oz (69.899 kg)  09/05/13 159 lb 6.4 oz (72.303 kg)  09/02/13 165 lb (74.844 kg)  08/29/13 158 lb (71.668 kg)  08/22/13 167 lb (75.751 kg)  08/08/13 164 lb (74.39 kg)  07/29/13 164 lb (74.39 kg)  07/25/13 164 lb 14.4 oz (74.798 kg)  07/16/13 171 lb (77.565 kg)  07/14/13 165 lb (74.844 kg)    Usual Body Weight: 170#  % Usual Body Weight: 91%  BMI:  Body mass index is 24.13 kg/(m^2). Meets criteria for normal weight.  Estimated  Nutritional Needs: Kcal: 2841-3244 daily Protein: 84-105 grams daily Fluid: 2.1-2.4 L daily  Skin: radiation burn to chest and lt shoulder blade  Diet Order: Full Liquid  EDUCATION NEEDS: -Education needs addressed   Intake/Output Summary (Last 24 hours) at 09/23/13 1324 Last data filed at 09/23/13 1237  Gross per 24 hour  Intake 816.67 ml  Output      1 ml  Net 815.67 ml    Last  BM: 09/22/13  Labs:   Recent Labs Lab 09/22/13 1009 09/23/13 0532  NA 136* 138  K 5.1 4.4  CL 101 104  CO2 23 19  BUN 34* 24*  CREATININE 3.22* 2.22*  CALCIUM 10.7* 9.9  GLUCOSE 112* 86    CBG (last 3)  No results found for this basename: GLUCAP,  in the last 72 hours  Scheduled Meds: . allopurinol  100 mg Oral Daily  . ALPRAZolam  0.5 mg Oral Q12H  . amLODipine  5 mg Oral Daily  . aspirin EC  81 mg Oral Daily  . enoxaparin (LOVENOX) injection  30 mg Subcutaneous Q24H  . fenofibrate  160 mg Oral Daily  . fluconazole (DIFLUCAN) IV  100 mg Intravenous Q24H  . hydrALAZINE  25 mg Oral TID  . levothyroxine  137 mcg Oral QAC breakfast  . magnesium oxide  800 mg Oral Daily  . pantoprazole  40 mg Oral BID  . PARoxetine  30 mg Oral Daily  . sucralfate  1 g Oral TID WC & HS    Continuous Infusions: . sodium chloride 100 mL/hr at 09/23/13 1135    Past Medical History  Diagnosis Date  . Hypertension   . Hypothyroidism   . Hyperlipidemia   . Insomnia   . GERD (gastroesophageal reflux disease)     chronic gastritis  . Adrenal adenoma   . Chronic diarrhea   . Fatty liver   . Arthritis     knee  . QT prolongation     syncope  . Coronary artery disease   . Impaired fasting glucose   . Renal insufficiency     low protien diet  . Tobacco use     1/2 ppd, approx 25-50 pack years (as of 08/2012)  . Family history of heart disease   . IBS (irritable bowel syndrome)   . Peripheral arterial disease     sstatus post  infrarenal abdominal aortic tube graft placed by Dr. Victorino Dike February 2008  . Cancer     Lung  . Squamous cell carcinoma of left lung 07/29/2013    Past Surgical History  Procedure Laterality Date  . Abdominal hysterectomy    . Thyroidectomy, partial    . Colonoscopy  5/04    normal  . Abdominal aortic aneurysm repair  07/2006    D. J.D. Kellie Simmering  . Cataract extraction Bilateral 2010  . Cardiac catheterization  02/2010    mod CAD in L-dominant system  with normal LV function  . Colon resection  2005    1/2 colon removed  . Transthoracic echocardiogram  02/2010    EF 55-60%; mild conc LVH, normal systolic function; mildly calcified AV annulus  . Colonoscopy with esophagogastroduodenoscopy (egd) N/A 03/19/2013    Procedure: COLONOSCOPY WITH ESOPHAGOGASTRODUODENOSCOPY (EGD);  Surgeon: Rogene Houston, MD;  Location: [facility];  Service: Endoscopy;  Laterality: N/A;  145  . Dg biopsy lung Left Jan 2015    Mitsugi Schrader A. Jimmye Norman, RD, LDN Pager: [phone]

## 2013-09-24 DIAGNOSIS — R1310 Dysphagia, unspecified: Secondary | ICD-10-CM | POA: Diagnosis not present

## 2013-09-24 DIAGNOSIS — N189 Chronic kidney disease, unspecified: Secondary | ICD-10-CM | POA: Diagnosis not present

## 2013-09-24 DIAGNOSIS — N179 Acute kidney failure, unspecified: Secondary | ICD-10-CM | POA: Diagnosis not present

## 2013-09-24 DIAGNOSIS — D509 Iron deficiency anemia, unspecified: Secondary | ICD-10-CM | POA: Diagnosis not present

## 2013-09-24 DIAGNOSIS — E860 Dehydration: Secondary | ICD-10-CM | POA: Diagnosis not present

## 2013-09-24 LAB — CBC
HCT: 22.4 % — ABNORMAL LOW (ref 36.0–46.0)
Hemoglobin: 7.5 g/dL — ABNORMAL LOW (ref 12.0–15.0)
MCH: 33.5 pg (ref 26.0–34.0)
MCHC: 33.5 g/dL (ref 30.0–36.0)
MCV: 100 fL (ref 78.0–100.0)
PLATELETS: 191 10*3/uL (ref 150–400)
RBC: 2.24 MIL/uL — AB (ref 3.87–5.11)
RDW: 16.7 % — ABNORMAL HIGH (ref 11.5–15.5)
WBC: 1.2 10*3/uL — CL (ref 4.0–10.5)

## 2013-09-24 LAB — BASIC METABOLIC PANEL
BUN: 20 mg/dL (ref 6–23)
CHLORIDE: 106 meq/L (ref 96–112)
CO2: 22 meq/L (ref 19–32)
Calcium: 9.8 mg/dL (ref 8.4–10.5)
Creatinine, Ser: 2.02 mg/dL — ABNORMAL HIGH (ref 0.50–1.10)
GFR calc Af Amer: 26 mL/min — ABNORMAL LOW (ref >90)
GFR calc non Af Amer: 23 mL/min — ABNORMAL LOW (ref >90)
Glucose, Bld: 83 mg/dL (ref 70–99)
POTASSIUM: 4.2 meq/L (ref 3.7–5.3)
SODIUM: 139 meq/L (ref 137–147)

## 2013-09-24 MED ORDER — ALUM & MAG HYDROXIDE-SIMETH 200-200-20 MG/5ML PO SUSP
30.0000 mL | ORAL | Status: DC | PRN
  Administered 2013-09-24: 30 mL via ORAL
  Filled 2013-09-24: qty 30

## 2013-09-24 MED ORDER — DARBEPOETIN ALFA-POLYSORBATE 200 MCG/0.4ML IJ SOLN
150.0000 ug | Freq: Once | INTRAMUSCULAR | Status: AC
  Administered 2013-09-24: 150 ug via SUBCUTANEOUS
  Filled 2013-09-24: qty 0.4

## 2013-09-24 NOTE — Progress Notes (Addendum)
Pt seen and examined, agree with above note per Dyanne Carrel NP AKi-improving, baseline creatinine around 1.8, stop IVF in next 24-48hours if Po intake improving, lasix/ACE on hold Odynophagia-improving with supportive care, likely radiation esophagitis, on PPI/carafate, Fluconazole, GI following Worsening anemia of chronic disease/chemo, Epogen x1, may need transfusion in next day or two  Domenic Polite, MD [phone]

## 2013-09-24 NOTE — Progress Notes (Signed)
[facility] PROGRESS NOTE  [name] SAY:[id number] DOB: [birth year] DOA: 09/22/2013 PCP: [name], MD  Summary:  79 year old with stage 3B lung CA on current chemo/XRT admitted with weakness, dysphagia/odynophagia, dizziness unable to tolerate po and AKI on CKD. GI following  Assessment/Plan: Principal Problem:  AKI (acute kidney injury): In setting of some chronic renal insufficiency. Creatinine at baseline. Continue to hold Lasix and enalapril. Urine output good. Monitor  Active Problems:   Odynophagia: Likely related to radiation therapy versus fungal infection versus herpes. Some improvement this am. Will continue Carafate and provide empiric fluconazole day #3. Appreciate GI assistance. They will reassess. Continue pain medication   Dehydration: Related to #2. Improved. IV fluids will reduce rate. Will treat #2 to encourage by mouth intake. Not orthostatic. Will change diet to full liquid to encourage po intake.   Coronary artery disease: No chest pain. Will continue home medications   Essential hypertension: Controlled. Continue to hold Lasix and enalapril. Will continue amlodipine and hydralazine.   Hyperlipidemia: Continue home medication   Peripheral arterial disease: Stable at baseline   Anemia, iron deficiency: Stable at baseline   Squamous cell carcinoma of left lung: Patient seen by oncology March 20. Plan was to complete radiation tomorrow. Chart review indicates scheduling a restaging scan in 4 weeks for further evaluation.   COPD, moderate: Not on home oxygen. Stable at baseline.    Code Status: full Family Communication: none present Disposition Plan: home when ready   Consultants:  Dr Laural Golden GI  Procedures:  none  Antibiotics:  Fluconazole 09/22/13>>  HPI/Subjective: Sitting on side of bed and reports "feeling a little better today". Able to eat a little more.  Objective: Filed Vitals:   09/24/13 0621  BP: 130/73  Pulse: 95  Temp:  98.7 F (37.1 C)  Resp: 15    Intake/Output Summary (Last 24 hours) at 09/24/13 1007 Last data filed at 09/23/13 1836  Gross per 24 hour  Intake 2636.67 ml  Output      0 ml  Net 2636.67 ml   Filed Weights   09/22/13 1317 09/23/13 0442  Weight: 66.906 kg (147 lb 8 oz) 69.899 kg (154 lb 1.6 oz)    Exam:   General:  NAD smiling  Cardiovascular: RRR No m/g/r/ no LE edema  Respiratory: normal effort BS clear to auscultation bilaterally no wheeze  Abdomen: soft non-distended +BS x4 non-tender  Musculoskeletal: no clubbing or cyanosis   Data Reviewed: Basic Metabolic Panel:  Recent Labs Lab 09/22/13 1009 09/23/13 0532 09/24/13 0440  NA 136* 138 139  K 5.1 4.4 4.2  CL 101 104 106  CO2 23 19 22   GLUCOSE 112* 86 83  BUN 34* 24* 20  CREATININE 3.22* 2.22* 2.02*  CALCIUM 10.7* 9.9 9.8   Liver Function Tests:  Recent Labs Lab 09/22/13 1009  AST 30  ALT 16  ALKPHOS 39  BILITOT 0.4  PROT 7.5  ALBUMIN 3.8   No results found for this basename: LIPASE, AMYLASE,  in the last 168 hours No results found for this basename: AMMONIA,  in the last 168 hours CBC:  Recent Labs Lab 09/22/13 1009 09/23/13 0532 09/24/13 0440  WBC 1.4* 1.2* 1.2*  NEUTROABS 0.9*  --   --   HGB 8.9* 8.4* 7.5*  HCT 26.3* 25.1* 22.4*  MCV 99.6 99.2 100.0  PLT 210 202 191   Cardiac Enzymes:  Recent Labs Lab 09/22/13 1059  TROPONINI <0.30   BNP (last 3 results) No results found  for this basename: PROBNP,  in the last 8760 hours CBG: No results found for this basename: GLUCAP,  in the last 168 hours  No results found for this or any previous visit (from the past 240 hour(s)).   Studies: Dg Chest 2 View  09/22/2013   CLINICAL DATA:  Fall, weakness.  History of lung cancer.  EXAM: CHEST  2 VIEW  COMPARISON:  CT chest 06/25/2013. Single view of the chest 07/16/2013.  FINDINGS: The lungs are emphysematous. Cavitary left lower lobe mass is identified as seen on the prior CT. No  consolidative process, pneumothorax or effusion is identified. Heart size is mildly enlarged. Port-A-Cath is noted.  IMPRESSION: No acute abnormality.  Cavitary left lower lobe mass.  Emphysema.   Electronically Signed   By: Inge Rise M.D.   On: 09/22/2013 10:40   Ct Head Wo Contrast  09/22/2013   CLINICAL DATA:  Fall.  Altered mental status.  EXAM: CT HEAD WITHOUT CONTRAST  TECHNIQUE: Contiguous axial images were obtained from the base of the skull through the vertex without intravenous contrast.  COMPARISON:  08/27/2013 MR.  FINDINGS: No skull fracture or intracranial hemorrhage.  Mild atrophy without hydrocephalus.  Small vessel disease type changes without CT evidence of large acute infarct.  No intracranial mass lesion noted on this unenhanced exam.  Visualized orbital structures unremarkable.  IMPRESSION: No skull fracture or intracranial hemorrhage.  Please see above.   Electronically Signed   By: Chauncey Cruel M.D.   On: 09/22/2013 10:54    Scheduled Meds: . allopurinol  100 mg Oral Daily  . ALPRAZolam  0.5 mg Oral Q12H  . amLODipine  5 mg Oral Daily  . aspirin EC  81 mg Oral Daily  . enoxaparin (LOVENOX) injection  30 mg Subcutaneous Q24H  . feeding supplement (RESOURCE BREEZE)  1 Container Oral TID BM  . fenofibrate  160 mg Oral Daily  . fluconazole (DIFLUCAN) IV  100 mg Intravenous Q24H  . hydrALAZINE  25 mg Oral TID  . levothyroxine  137 mcg Oral QAC breakfast  . magnesium oxide  800 mg Oral Daily  . pantoprazole  40 mg Oral BID  . PARoxetine  30 mg Oral Daily  . sucralfate  1 g Oral TID WC & HS   Continuous Infusions: . sodium chloride 75 mL/hr at 09/24/13 0000    Principal Problem:   AKI (acute kidney injury) Active Problems:   Coronary artery disease   Essential hypertension   Hyperlipidemia   Peripheral arterial disease   Anemia, iron deficiency   Squamous cell carcinoma of left lung   Dehydration   Odynophagia   COPD, moderate   Protein-calorie  malnutrition, severe    Time spent: 30 minutes    [facility] Pager [phone]. If 7PM-7AM, please contact night-coverage at [email or web address], password [facility] 09/24/2013, 10:07 AM  LOS: 2 days

## 2013-09-24 NOTE — Progress Notes (Addendum)
Patient ID: [name], female   DOB: [birth year], 79 y.o.   MRN: [id number] Continues to have mid-sternal burning.  She ate applesauce and jello yesterday. She says it felt like bolus lodged, but did pass. She has received 2 radiation treatments. Last treatment about 2 weeks ago. She says she feels better today.  Tolerating grits this am.  Filed Vitals:   09/22/13 2054 09/23/13 0442 09/23/13 1421 09/24/13 0621  BP: 121/61 115/55 126/69 130/73  Pulse: 96 99 102 95  Temp: 98.5 F (36.9 C) 98.7 F (37.1 C) 98.2 F (36.8 C) 98.7 F (37.1 C)  TempSrc: Oral Oral Oral Oral  Resp: 18 18 20 15   Height:      Weight:  154 lb 1.6 oz (69.899 kg)    SpO2: 97% 98% 100% 99%  Alert and oriented. Lungs clear. HR regular. Abdomenis soft. BS+. Slight tenderness epigastric region. No edema to lower extremities. 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 Continue Carafate and Prontonix.   GI attending note; Patient reports reduction in pain on swallowing. Appetite remains poor. Patient given single dose of epogen for low hemoglobin. She remains with neutropenia. Will provide yogurt with each meal.

## 2013-09-25 ENCOUNTER — Other Ambulatory Visit (HOSPITAL_COMMUNITY): Payer: Medicare Other

## 2013-09-25 DIAGNOSIS — E860 Dehydration: Secondary | ICD-10-CM | POA: Diagnosis not present

## 2013-09-25 DIAGNOSIS — R1310 Dysphagia, unspecified: Secondary | ICD-10-CM | POA: Diagnosis not present

## 2013-09-25 LAB — BASIC METABOLIC PANEL
BUN: 15 mg/dL (ref 6–23)
CHLORIDE: 107 meq/L (ref 96–112)
CO2: 23 mEq/L (ref 19–32)
Calcium: 9.6 mg/dL (ref 8.4–10.5)
Creatinine, Ser: 1.98 mg/dL — ABNORMAL HIGH (ref 0.50–1.10)
GFR calc non Af Amer: 23 mL/min — ABNORMAL LOW (ref >90)
GFR, EST AFRICAN AMERICAN: 27 mL/min — AB (ref >90)
Glucose, Bld: 92 mg/dL (ref 70–99)
POTASSIUM: 4.3 meq/L (ref 3.7–5.3)
Sodium: 140 mEq/L (ref 137–147)

## 2013-09-25 LAB — CBC
HEMATOCRIT: 21.6 % — AB (ref 36.0–46.0)
Hemoglobin: 7.3 g/dL — ABNORMAL LOW (ref 12.0–15.0)
MCH: 33.8 pg (ref 26.0–34.0)
MCHC: 33.8 g/dL (ref 30.0–36.0)
MCV: 100 fL (ref 78.0–100.0)
Platelets: 166 10*3/uL (ref 150–400)
RBC: 2.16 MIL/uL — AB (ref 3.87–5.11)
RDW: 16.7 % — AB (ref 11.5–15.5)
WBC: 1.3 10*3/uL — AB (ref 4.0–10.5)

## 2013-09-25 MED ORDER — FLUCONAZOLE 100 MG PO TABS
100.0000 mg | ORAL_TABLET | Freq: Every day | ORAL | Status: DC
  Administered 2013-09-26: 100 mg via ORAL
  Filled 2013-09-25: qty 1

## 2013-09-25 NOTE — Progress Notes (Signed)
Patient ID: [name], female   DOB: 10/28/1934, 79 y.o.   MRN: [id number] Alert this am. She continues to have burning in her esophagus. Is able to swallow crushed pills/pills with applesauce.  Filed Vitals:   09/24/13 0621 09/24/13 1420 09/24/13 2202 09/25/13 0544  BP: 130/73 148/78 131/62 118/66  Pulse: 95 101 83 94  Temp: 98.7 F (37.1 C) 98.1 F (36.7 C) 98.5 F (36.9 C) 98.2 F (36.8 C)  TempSrc: Oral Oral  Oral  Resp: 15 20 16 18   Height:      Weight:    154 lb 8 oz (70.081 kg)  SpO2: 99% 93% 100% 100%   Lungs are clear. Is tolerating yogurt. Probable radiation induced esophagitis: Continue the Carafate and Protonix. Will continue to monitor.

## 2013-09-25 NOTE — Progress Notes (Signed)
Patient seen, independently examined and chart reviewed. I agree with exam, assessment and plan discussed with Dyanne Carrel, NP.  79 year old woman currently undergoing radiotherapy and chemotherapy for squamous cell carcinoma present to the emergency department after fall. Additional history included of the swallowing and failure to thrive. She was admitted for acute renal failure, odynophagia  PMH Metastatic lung cancer Anxiety  Subjective: Feels better today. Tolerating soft diet. No vomiting. Less pain with swallowing.  Objective: Afebrile, vital signs stable. No hypoxia. Appears calm and comfortable. Speech fluent and clear. Cardiovascular regular rate and rhythm. Respiratory clear to auscultation bilaterally. No wheezes, rales or rhonchi. Normal respiratory effort. Abdomen soft  Creatinine stable 1.98. Leukopenia and anemia stable.  Overall she is improving with resolving odynophagia secondary to presumed radiation esophagitis. Anticipate discharge tomorrow.  Severe malnutrition in context of chronic illness  Murray Hodgkins, MD [facility] [phone]

## 2013-09-25 NOTE — Progress Notes (Signed)
[facility] PROGRESS NOTE  [name] GMW:[id number] DOB: 1934/08/08 DOA: 09/22/2013 PCP: [name], MD  Summary:  79 year old with stage 3B lung CA on current chemo/XRT admitted with weakness, dysphagia/odynophagia, dizziness unable to tolerate po and AKI on CKD. Worsening anemia of chronic disease/chemo. GI following  Assessment/Plan: Principal Problem:  AKI (acute kidney injury): In setting of some chronic renal insufficiency. Creatinine at baseline. Continue to hold Lasix and enalapril. Po intake improving. Will decrease IV rate. Urine output good. Monitor  Active Problems:  Odynophagia: Likely related to radiation therapy versus fungal infection versus herpes. Continues to improve. Will advance diet to soft foods per patient request.  Will continue Carafate and provide empiric fluconazole day #4. Appreciate GI assistance.  Continue pain medication   Dehydration: Related to #2. resolved. IV fluids will reduce rate. Will treat #2 to encourage by mouth intake. Not orthostatic. Advancing diet to soft foods   Coronary artery disease: No chest pain. Will continue home medications   Essential hypertension: Controlled. Continue to hold Lasix and enalapril. Will continue amlodipine and hydralazine.   Hyperlipidemia: Continue home medication   Peripheral arterial disease: Stable at baseline   Anemia, iron deficiency: slightly under baseline but >7.0. HR range 83-101. Oxygen saturation level 93-100% on room air. Will consider transfusion if no upward trend. Aranesp x1 yesterday.    Squamous cell carcinoma of left lung: Patient seen by oncology March 20. Plan was to complete radiation 3/31. Chart review indicates scheduling a restaging scan in 4 weeks for further evaluation.   COPD, moderate: Not on home oxygen. Stable at baseline.   Code Status: full Family Communication: daughter at bedside Disposition Plan: home when ready hopefully tomorrow   Consultants:  Dr.  Laural Golden  Procedures:  none  Antibiotics:  Fluconazole 09/22/13>>  HPI/Subjective: Lying in bed eyes closed. Easily aroused. Reports less pain with swallowing today and would like soft food.   Objective: Filed Vitals:   09/25/13 0544  BP: 118/66  Pulse: 94  Temp: 98.2 F (36.8 C)  Resp: 18    Intake/Output Summary (Last 24 hours) at 09/25/13 1123 Last data filed at 09/25/13 0929  Gross per 24 hour  Intake    630 ml  Output      1 ml  Net    629 ml   Filed Weights   09/22/13 1317 09/23/13 0442 09/25/13 0544  Weight: 66.906 kg (147 lb 8 oz) 69.899 kg (154 lb 1.6 oz) 70.081 kg (154 lb 8 oz)    Exam:   General:  Appears comfortable  Cardiovascular: RRR No MGR No LE edema  Respiratory: normal effort BS clear bilaterally no wheeze  Abdomen: non-distended, non-tender to palpation +BS   Musculoskeletal: no clubbing or cyanosis   Data Reviewed: Basic Metabolic Panel:  Recent Labs Lab 09/22/13 1009 09/23/13 0532 09/24/13 0440 09/25/13 0711  NA 136* 138 139 140  K 5.1 4.4 4.2 4.3  CL 101 104 106 107  CO2 23 19 22 23   GLUCOSE 112* 86 83 92  BUN 34* 24* 20 15  CREATININE 3.22* 2.22* 2.02* 1.98*  CALCIUM 10.7* 9.9 9.8 9.6   Liver Function Tests:  Recent Labs Lab 09/22/13 1009  AST 30  ALT 16  ALKPHOS 39  BILITOT 0.4  PROT 7.5  ALBUMIN 3.8   No results found for this basename: LIPASE, AMYLASE,  in the last 168 hours No results found for this basename: AMMONIA,  in the last 168 hours CBC:  Recent Labs Lab 09/22/13  1009 09/23/13 0532 09/24/13 0440 09/25/13 0711  WBC 1.4* 1.2* 1.2* 1.3*  NEUTROABS 0.9*  --   --   --   HGB 8.9* 8.4* 7.5* 7.3*  HCT 26.3* 25.1* 22.4* 21.6*  MCV 99.6 99.2 100.0 100.0  PLT 210 202 191 166   Cardiac Enzymes:  Recent Labs Lab 09/22/13 1059  TROPONINI <0.30   BNP (last 3 results) No results found for this basename: PROBNP,  in the last 8760 hours CBG: No results found for this basename: GLUCAP,  in the last  168 hours  No results found for this or any previous visit (from the past 240 hour(s)).   Studies: No results found.  Scheduled Meds: . allopurinol  100 mg Oral Daily  . ALPRAZolam  0.5 mg Oral Q12H  . amLODipine  5 mg Oral Daily  . aspirin EC  81 mg Oral Daily  . enoxaparin (LOVENOX) injection  30 mg Subcutaneous Q24H  . feeding supplement (RESOURCE BREEZE)  1 Container Oral TID BM  . fenofibrate  160 mg Oral Daily  . fluconazole (DIFLUCAN) IV  100 mg Intravenous Q24H  . hydrALAZINE  25 mg Oral TID  . levothyroxine  137 mcg Oral QAC breakfast  . magnesium oxide  800 mg Oral Daily  . pantoprazole  40 mg Oral BID  . PARoxetine  30 mg Oral Daily  . sucralfate  1 g Oral TID WC & HS   Continuous Infusions: . sodium chloride 50 mL/hr at 09/25/13 3794    Principal Problem:   AKI (acute kidney injury) Active Problems:   Coronary artery disease   Essential hypertension   Hyperlipidemia   Peripheral arterial disease   Anemia, iron deficiency   Squamous cell carcinoma of left lung   Dehydration   Odynophagia   COPD, moderate   Protein-calorie malnutrition, severe    Time spent: 30 minutes    [facility] Pager [phone]. If 7PM-7AM, please contact night-coverage at [email or web address], password [email or web address] 09/25/2013, 11:23 AM  LOS: 3 days

## 2013-09-26 DIAGNOSIS — N179 Acute kidney failure, unspecified: Secondary | ICD-10-CM | POA: Diagnosis not present

## 2013-09-26 DIAGNOSIS — D709 Neutropenia, unspecified: Secondary | ICD-10-CM | POA: Diagnosis not present

## 2013-09-26 DIAGNOSIS — E43 Unspecified severe protein-calorie malnutrition: Secondary | ICD-10-CM

## 2013-09-26 DIAGNOSIS — T66XXXA Radiation sickness, unspecified, initial encounter: Secondary | ICD-10-CM

## 2013-09-26 DIAGNOSIS — K208 Other esophagitis without bleeding: Secondary | ICD-10-CM

## 2013-09-26 LAB — CBC
HCT: 25.2 % — ABNORMAL LOW (ref 36.0–46.0)
HEMOGLOBIN: 8.4 g/dL — AB (ref 12.0–15.0)
MCH: 33.6 pg (ref 26.0–34.0)
MCHC: 33.3 g/dL (ref 30.0–36.0)
MCV: 100.8 fL — ABNORMAL HIGH (ref 78.0–100.0)
Platelets: 238 10*3/uL (ref 150–400)
RBC: 2.5 MIL/uL — AB (ref 3.87–5.11)
RDW: 17.2 % — ABNORMAL HIGH (ref 11.5–15.5)
WBC: 2.1 10*3/uL — ABNORMAL LOW (ref 4.0–10.5)

## 2013-09-26 MED ORDER — PANTOPRAZOLE SODIUM 40 MG PO PACK
40.0000 mg | PACK | Freq: Two times a day (BID) | ORAL | Status: DC
  Administered 2013-09-26: 40 mg via ORAL
  Filled 2013-09-26 (×7): qty 20

## 2013-09-26 MED ORDER — HEPARIN SOD (PORK) LOCK FLUSH 100 UNIT/ML IV SOLN
500.0000 [IU] | INTRAVENOUS | Status: AC | PRN
  Administered 2013-09-26: 500 [IU]
  Filled 2013-09-26: qty 5

## 2013-09-26 MED ORDER — ESOMEPRAZOLE MAGNESIUM 40 MG PO CPDR: 40.0000 mg | DELAYED_RELEASE_CAPSULE | Freq: Two times a day (BID) | ORAL | Status: DC

## 2013-09-26 MED ORDER — FLUCONAZOLE 100 MG PO TABS: 100.0000 mg | ORAL_TABLET | Freq: Every day | ORAL | Status: DC

## 2013-09-26 MED ORDER — ASPIRIN 81 MG PO CHEW
81.0000 mg | CHEWABLE_TABLET | Freq: Every day | ORAL | Status: DC
  Administered 2013-09-26: 81 mg via ORAL
  Filled 2013-09-26: qty 1

## 2013-09-26 NOTE — Progress Notes (Signed)
PROGRESS NOTE  [name] JJO:[id number] DOB: 02-Sep-1934 DOA: 09/22/2013 PCP: [name], MD  Summary: 79 year old woman currently undergoing radiotherapy and chemotherapy for squamous cell carcinoma present to the emergency department after fall. Additional history included of the swallowing and failure to thrive. She was admitted for acute renal failure, odynophagia  Assessment/Plan: 1. Odynophagia secondary to presumed radiation esophagitis. Responding well to empiric fluconazole, continue Carafate. 2. Possible acute kidney injury. Creatinine appears to be at baseline. 3. Anemia. Secondary chemotherapy. Stable. 4. Leukopenia. Secondary to chemotherapy. Improving. No evidence of infection. 5. Dehydration resolved. 6. Chronic kidney disease stage IV appears to be at baseline. 7. Stage 3B lung CA on current chemo/XRT  8. Anxiety 9. Severe malnutrition in context of chronic illness   Home today  Discussed with sister at bedside  Murray Hodgkins, MD  [facility]  Pager [phone] If 7PM-7AM, please contact night-coverage at [email or web address], password [facility] 09/26/2013, 3:34 PM  LOS: 4 days   Consultants:  Gastroenterology  Procedures:    Antibiotics:  Fluconazole 3/30 >> 4/5  HPI/Subjective: Tolerating diet. Feels much better. No vomiting. No abdominal pain. Swallowing continues to improve, much less painful.  Objective: Filed Vitals:   09/25/13 0544 09/25/13 1436 09/25/13 2100 09/26/13 0629  BP: 118/66 119/66 129/64 133/66  Pulse: 94 89 75 84  Temp: 98.2 F (36.8 C) 98.8 F (37.1 C) 98 F (36.7 C) 98.8 F (37.1 C)  TempSrc: Oral Oral Oral Oral  Resp: 18 20 18 18   Height:      Weight: 70.081 kg (154 lb 8 oz)     SpO2: 100% 97% 96% 95%    Intake/Output Summary (Last 24 hours) at 09/26/13 1534 Last data filed at 09/26/13 1300  Gross per 24 hour  Intake    480 ml  Output    400 ml  Net     80 ml     Filed Weights   09/22/13 1317 09/23/13 0442 09/25/13  0544  Weight: 66.906 kg (147 lb 8 oz) 69.899 kg (154 lb 1.6 oz) 70.081 kg (154 lb 8 oz)    Exam:   Afebrile, vital signs are stable. No hypoxia.  Gen. Appears calm and comfortable. Speech fluent and clear.  Cardiovascular regular rate and rhythm. No murmur, rub or gallop. No lower extremity edema.  Respiratory clear to auscultation bilaterally. No wheezes, rales or rhonchi. Normal respiratory effort.  Abdomen soft, nondistended, nontender.  Psychiatric grossly normal mood and affect. Speech fluent and appropriate.  Data Reviewed:  CBC 2.1, hemoglobin 8.4, platelet count 238.  Scheduled Meds: . allopurinol  100 mg Oral Daily  . ALPRAZolam  0.5 mg Oral Q12H  . amLODipine  5 mg Oral Daily  . aspirin  81 mg Oral Daily  . enoxaparin (LOVENOX) injection  30 mg Subcutaneous Q24H  . feeding supplement (RESOURCE BREEZE)  1 Container Oral TID BM  . fenofibrate  160 mg Oral Daily  . fluconazole  100 mg Oral Daily  . hydrALAZINE  25 mg Oral TID  . levothyroxine  137 mcg Oral QAC breakfast  . magnesium oxide  800 mg Oral Daily  . pantoprazole sodium  40 mg Oral BID  . PARoxetine  30 mg Oral Daily  . sucralfate  1 g Oral TID WC & HS   Continuous Infusions: . sodium chloride 20 mL/hr at 09/25/13 1142    Principal Problem:   AKI (acute kidney injury) Active Problems:   Coronary artery disease   Essential hypertension   Hyperlipidemia  Peripheral arterial disease   Anemia, iron deficiency   Squamous cell carcinoma of left lung   Dehydration   Odynophagia   COPD, moderate   Protein-calorie malnutrition, severe

## 2013-09-26 NOTE — Discharge Summary (Signed)
Physician Discharge Summary  [name] LFY:[id number] DOB: [birth year] DOA: 09/22/2013  PCP: [name], MD  Admit date: 09/22/2013 Discharge date: 09/26/2013  Recommendations for Outpatient Follow-up:  1. Resolution of presumed radiation esophagitis 2. Chronic kidney disease stage IV, currently at baseline, was dehydrated on admission. Lasix and potassium supplementation currently held. May be able to restart soon in the outpatient setting. Consider repeat basic metabolic panel. 3. Severe malnutrition  Follow-up Information   Follow up with [name], MD. Schedule an appointment as soon as possible for a visit in 1 week.   Specialty:  Family Medicine   Contact information:   [address] [place] [address] [phone]      Discharge Diagnoses:  1. Presumed radiation esophagitis 2. Acute kidney injury 3. Chronic kidney disease stage IV 4. Anemia and leukopenia secondary to chemotherapy 5. Stage 3B lung cancer  6. Severe malnutrition  Discharge Condition: Improved Disposition: Home  Diet recommendation: Soft  Filed Weights   09/22/13 1317 09/23/13 0442 09/25/13 0544  Weight: 66.906 kg (147 lb 8 oz) 69.899 kg (154 lb 1.6 oz) 70.081 kg (154 lb 8 oz)    History of present illness:  79 year old woman currently undergoing radiotherapy and chemotherapy for squamous cell carcinoma present to the emergency department after fall. Additional history included of the swallowing and failure to thrive. She was admitted for acute renal failure, odynophagia  Hospital Course:  Patient was admitted, seen in consultation with gastroenterology treated with supportive care for odynophagia thought to be secondary to radiation induced esophagitis. He was also treated empirically with Diflucan. Conservative management was recommended by gastroenterology. Her condition gradually improved, she is tolerating a diet and is now stable for discharge. Individual issues as  below.  1. Odynophagia secondary to presumed radiation esophagitis. Responding well to empiric fluconazole, continue Carafate. 2. Acute kidney injury. Creatinine appears to be at baseline now. 3. Anemia. Secondary chemotherapy. Stable. 4. Leukopenia. Secondary to chemotherapy. Improving. No evidence of infection. 5. Dehydration resolved. 6. Chronic kidney disease stage IV appears to be at baseline. 7. Stage 3B lung CA on current chemo/XRT  8. Anxiety 9. Severe malnutrition in context of chronic illness  Consultants:  Gastroenterology Procedures: none Antibiotics:  Fluconazole 3/30 >> 4/5  Discharge Instructions  Discharge Orders   Future Appointments Provider Department Dept Phone   09/30/2013 3:30 PM [place], [place] [phone]   10/14/2013 9:30 AM [facility] [address] [address] [facility] [phone]   Please pick up your oral contrast prep at your scheduled location at least 1 day prior to your appointment. Liquids only 4 hours prior to your exam. Any medications can be taken as usual. Please arrive 15 min prior to your scheduled exam time.   10/14/2013 10:15 AM Ap[facility] [address] [address] [facility] [phone]   Liquids only 4 hours prior to your exam. Any medications can be taken as usual. Please arrive 15 min prior to your scheduled exam time.   10/15/2013 10:30 AM [facility], PA-C [facility] [phone]   Future Orders Complete By Expires   Activity as tolerated - No restrictions  As directed    Diet general  As directed    Discharge instructions  As directed    Comments:     Call your physician or seek immediate medical attention for recurrent vomiting, abdominal pain, inability to eat or worsening of your condition.       Medication List    STOP taking  these medications       chlorpheniramine-HYDROcodone 10-8 MG/5ML Lqcr  Commonly known as:  TUSSIONEX     furosemide 20 MG tablet  Commonly known as:  LASIX     potassium chloride SA  20 MEQ tablet  Commonly known as:  K-DUR,KLOR-CON      TAKE these medications       allopurinol 300 MG tablet  Commonly known as:  ZYLOPRIM  Take 300 mg by mouth daily.     ALPRAZolam 1 MG tablet  Commonly known as:  XANAX  TAKE ONE HALF TABLET BY MOUTH 4 TIMES DAILY AS NEEDED     amLODipine 10 MG tablet  Commonly known as:  NORVASC  TAKE ONE TABLET AT BEDTIME (STOP VERAPAMIL)     aspirin EC 81 MG tablet  Take 81 mg by mouth daily.     Cholecalciferol 1000 UNITS capsule  Take 1,000 Units by mouth daily.     enalapril 20 MG tablet  Commonly known as:  VASOTEC  take 1 tablet by mouth once daily     esomeprazole 40 MG capsule  Commonly known as:  NEXIUM  Take 1 capsule (40 mg total) by mouth 2 (two) times daily before a meal.     fenofibrate 160 MG tablet  take 1 tablet by mouth once daily with food     ferrous sulfate 325 (65 FE) MG tablet  Commonly known as:  FERROUSUL  Take 1 tablet (325 mg total) by mouth 2 (two) times daily after a meal.     fluconazole 100 MG tablet  Commonly known as:  DIFLUCAN  Take 1 tablet (100 mg total) by mouth daily.     hydrALAZINE 25 MG tablet  Commonly known as:  APRESOLINE  take 1 tablet by mouth three times a day     HYDROcodone-acetaminophen 10-325 MG per tablet  Commonly known as:  NORCO  Take 1 tablet by mouth every 6 (six) hours as needed for moderate pain.     hydrOXYzine 25 MG tablet  Commonly known as:  ATARAX/VISTARIL  Take 1 tablet (25 mg total) by mouth 3 (three) times daily as needed for itching.     levothyroxine 137 MCG tablet  Commonly known as:  SYNTHROID, LEVOTHROID  Take 137 mcg by mouth daily before breakfast.     loperamide 2 MG capsule  Commonly known as:  IMODIUM  Take 2 mg by mouth 4 (four) times daily as needed for diarrhea or loose stools.     loratadine 10 MG tablet  Commonly known as:  CLARITIN  Take 10 mg by mouth daily as needed for allergies.     magnesium oxide 400 MG tablet  Commonly  known as:  MAG-OX  Take 750 mg by mouth daily.     metoCLOPramide 5 MG tablet  Commonly known as:  REGLAN  Starting the day after chemo, take 1 tablet four times a day x 48 hours. Then may take 1 tablet four times a day IF needed for nausea/vomiting.     ondansetron 8 MG tablet  Commonly known as:  ZOFRAN  TAKE ONE TABLET BY MOUTH EVERY 8 HOURS AS NEEDED FOR NAUSEA     PARoxetine 30 MG tablet  Commonly known as:  PAXIL  take 1 tablet by mouth once daily     pravastatin 20 MG tablet  Commonly known as:  PRAVACHOL  Take 20 mg by mouth at bedtime.     prochlorperazine 10 MG tablet  Commonly known as:  COMPAZINE  Starting the day after chemo, take 1 tablet four times a day x 48 hours. Then may take 1 tablet four times a day IF needed for nausea/vomiting.     sucralfate 1 GM/10ML suspension  Commonly known as:  CARAFATE  Take 1 g by mouth 4 (four) times daily -  with meals and at bedtime.     temazepam 15 MG capsule  Commonly known as:  RESTORIL  Take 1 capsule (15 mg total) by mouth at bedtime as needed for sleep.     traMADol 50 MG tablet  Commonly known as:  ULTRAM  Take 1 tablet (50 mg total) by mouth every 6 (six) hours as needed.       Allergies  Allergen Reactions  . Aleve [Naproxen Sodium] Other (See Comments)    GI bleed  . Dilaudid [Hydromorphone Hcl]     Can not tolerate  . Dyazide [Hydrochlorothiazide W-Triamterene] Other (See Comments)    cramps  . Zithromax [Azithromycin] Itching    The results of significant diagnostics from this hospitalization (including imaging, microbiology, ancillary and laboratory) are listed below for reference.    Significant Diagnostic Studies: Dg Chest 2 View  09/22/2013   CLINICAL DATA:  Fall, weakness.  History of lung cancer.  EXAM: CHEST  2 VIEW  COMPARISON:  CT chest 06/25/2013. Single view of the chest 07/16/2013.  FINDINGS: The lungs are emphysematous. Cavitary left lower lobe mass is identified as seen on the prior CT.  No consolidative process, pneumothorax or effusion is identified. Heart size is mildly enlarged. Port-A-Cath is noted.  IMPRESSION: No acute abnormality.  Cavitary left lower lobe mass.  Emphysema.   Electronically Signed   By: Inge Rise M.D.   On: 09/22/2013 10:40   Ct Head Wo Contrast  09/22/2013   CLINICAL DATA:  Fall.  Altered mental status.  EXAM: CT HEAD WITHOUT CONTRAST  TECHNIQUE: Contiguous axial images were obtained from the base of the skull through the vertex without intravenous contrast.  COMPARISON:  08/27/2013 MR.  FINDINGS: No skull fracture or intracranial hemorrhage.  Mild atrophy without hydrocephalus.  Small vessel disease type changes without CT evidence of large acute infarct.  No intracranial mass lesion noted on this unenhanced exam.  Visualized orbital structures unremarkable.  IMPRESSION: No skull fracture or intracranial hemorrhage.  Please see above.   Electronically Signed   By: Chauncey Cruel M.D.   On: 09/22/2013 10:54   Labs: Basic Metabolic Panel:  Recent Labs Lab 09/22/13 1009 09/23/13 0532 09/24/13 0440 09/25/13 0711  NA 136* 138 139 140  K 5.1 4.4 4.2 4.3  CL 101 104 106 107  CO2 23 19 22 23   GLUCOSE 112* 86 83 92  BUN 34* 24* 20 15  CREATININE 3.22* 2.22* 2.02* 1.98*  CALCIUM 10.7* 9.9 9.8 9.6   Liver Function Tests:  Recent Labs Lab 09/22/13 1009  AST 30  ALT 16  ALKPHOS 39  BILITOT 0.4  PROT 7.5  ALBUMIN 3.8   CBC:  Recent Labs Lab 09/22/13 1009 09/23/13 0532 09/24/13 0440 09/25/13 0711 09/26/13 0824  WBC 1.4* 1.2* 1.2* 1.3* 2.1*  NEUTROABS 0.9*  --   --   --   --   HGB 8.9* 8.4* 7.5* 7.3* 8.4*  HCT 26.3* 25.1* 22.4* 21.6* 25.2*  MCV 99.6 99.2 100.0 100.0 100.8*  PLT 210 202 191 166 238   Cardiac Enzymes:  Recent Labs Lab 09/22/13 1059  TROPONINI <0.30    Principal Problem:   Radiation  esophagitis Active Problems:   Coronary artery disease   Essential hypertension   Hyperlipidemia   Peripheral arterial  disease   Anemia, iron deficiency   Squamous cell carcinoma of left lung   Dehydration   Odynophagia   AKI (acute kidney injury)   COPD, moderate   Protein-calorie malnutrition, severe   Time coordinating discharge: 35 minutes  Signed:  Murray Hodgkins, MD [facility] 09/26/2013, 3:45 PM

## 2013-09-26 NOTE — Plan of Care (Signed)
Problem: Discharge Progression Outcomes Goal: Pain controlled with appropriate interventions Outcome: Completed/Met Date Met:  09/26/13 denies Goal: Other Discharge Outcomes/Goals Outcome: Completed/Met Date Met:  09/26/13 Discharged to home

## 2013-09-28 ENCOUNTER — Other Ambulatory Visit: Payer: Self-pay | Admitting: Family Medicine

## 2013-09-28 NOTE — Progress Notes (Signed)
Leah Battiest, MD [place] [id number]  Squamous cell carcinoma of left lung  CURRENT THERAPY: S/P Weekly carboplatin/Taxol with concurrent daily radiotherapy started on 08/07/2013 and finished on 09/12/2013   INTERVAL HISTORY: Leah Leah 79 y.o. female returns for  regular  visit for followup of Stage IIIB squamous cell carcinoma with a 4.1 cm left lower lobe lesion, left hilar and bilateral mediastinal lymphadenopathy (questionable hepatic lesion and left upper lobe of lung lesion).  S/P Weekly carboplatin/Taxol with concurrent daily radiotherapy started on 08/07/2013 and finished on 09/12/2013.    Squamous cell carcinoma of left lung   06/30/2013 Imaging CT of chest demonstrates 4 cm left lower lobe lesion with other concerning findings for malignancy   07/09/2013 Imaging PET- 4.1 cm left lower oobe lesion with left hilar and bilateral mediastinal lymphadeopathy.  Hepatic lesion not confidently identified.  1.8 cm nodule in left upper lobe, cannot exclude low grade tumor.   07/16/2013 Initial Diagnosis Squamous cell carcinoma of left lung via CT-guided biopsy by IR   07/29/2013 Surgery IR placement of port-a-cath in preparation for concomittant chemoradiation    08/08/2013 - 09/12/2013 Chemotherapy Paclitaxel/Carboplatin weekly x 6    Unfotunately, [name] was admitted to the [facility] following a fall.  She was admitted and dicharged from the hospital on 09/22/2013 and 09/26/2013 respectively.   I personally reviewed and went over laboratory results with the patient.  The results are noted within this dictation.  The patient is seen as a work-in today.  The family has been told that radiation oncology is deferring further radiation treatment to [place].  We discussed the role of radiation therapy and the family has decided to forego her last two radiation treatments.   [name] has been having a number of complaints.   1. Dry heaving following a coughing episode- the goal  of therapy is to decrease the coughs as this is the inciting factor of her heaving.  She is taking tussionex every 12 hours.  This is effective, but it causes drowsiness which is expected.  I have recommended halving the dose of the medication and taking every 6 hours PRN. 2. Burning of throat- tussionex has hydrocodone in it and hopefully this dosing schedule will help with that discomfort as it is a side effect of radiation. 3. Increased saliva production- side effect of radiation 4. Decreased appetite- secondary to treatment and the aforementioned issues.  I recommended ensure and other meal replacements, but these have not worked in the past as [name] does not like the taste.  I recommended eating when desirable and avoiding force feeding as this will improve in time. 5. Decreased oral intake- the patient is drinking 2 bottles daily of water.  This is inadequate so we will plan for IV fluids tomorrow afternoon. 6. Pedal edema- I recommended elevation of LE when resting above the level of the umbilicus.  I will discuss the case with Radiation Oncology tomorrow and inform them of the decision of the patient and family.   Past Medical History  Diagnosis Date  . Hypertension   . Hypothyroidism   . Hyperlipidemia   . Insomnia   . GERD (gastroesophageal reflux disease)     chronic gastritis  . Adrenal adenoma   . Chronic diarrhea   . Fatty liver   . Arthritis     knee  . QT prolongation     syncope  . Coronary artery disease   . Impaired fasting glucose   .  Renal insufficiency     low protien diet  . Tobacco use     1/2 ppd, approx 25-50 pack years (as of 08/2012)  . Family history of heart disease   . IBS (irritable bowel syndrome)   . Peripheral arterial disease     sstatus post  infrarenal abdominal aortic tube graft placed by Dr. Victorino Dike February 2008  . Cancer     Lung  . Squamous cell carcinoma of left lung 07/29/2013    has Unspecified hypothyroidism; Pruritic dermatitis; HTN  (hypertension); Chronic renal insufficiency; Other and unspecified hyperlipidemia; Generalized anxiety disorder; Insomnia; Esophageal reflux; Coronary artery disease; Essential hypertension; Hyperlipidemia; Peripheral arterial disease; Anemia, iron deficiency; Hemoptysis; Squamous cell carcinoma of left lung; Dehydration; Odynophagia; AKI (acute kidney injury); COPD, moderate; Protein-calorie malnutrition, severe; and Radiation esophagitis on her problem list.     is allergic to aleve; dilaudid; dyazide; and zithromax.  Ms. Shutters does not currently have medications on file.  Past Surgical History  Procedure Laterality Date  . Abdominal hysterectomy    . Thyroidectomy, partial    . Colonoscopy  5/04    normal  . Abdominal aortic aneurysm repair  07/2006    D. J.D. Kellie Simmering  . Cataract extraction Bilateral 2010  . Cardiac catheterization  02/2010    mod CAD in L-dominant system with normal LV function  . Colon resection  2005    1/2 colon removed  . Transthoracic echocardiogram  02/2010    EF 55-60%; mild conc LVH, normal systolic function; mildly calcified AV annulus  . Colonoscopy with esophagogastroduodenoscopy (egd) N/A 03/19/2013    Procedure: COLONOSCOPY WITH ESOPHAGOGASTRODUODENOSCOPY (EGD);  Surgeon: Rogene Houston, MD;  Location: [facility];  Service: Endoscopy;  Laterality: N/A;  145  . Dg biopsy lung Left Jan 2015    Denies any headaches, dizziness, double vision, fevers, chills, night sweats, nausea, vomiting, diarrhea, constipation, chest pain, heart palpitations, shortness of breath, blood in stool, black tarry stool, urinary pain, urinary burning, urinary frequency, hematuria.   PHYSICAL EXAMINATION  ECOG PERFORMANCE STATUS: 3 - Symptomatic, >50% confined to bed  Filed Vitals:   09/30/13 1500  BP: 112/66  Pulse: 101  Temp: 98.2 F (36.8 C)  Resp: 18    GENERAL:alert, no distress and sleeping at times SKIN: skin color, texture, turgor are normal, no rashes or  significant lesions HEAD: Normocephalic, No masses, lesions, tenderness or abnormalities EYES: normal, PERRLA, EOMI, Conjunctiva are pink and non-injected EARS: External ears normal OROPHARYNX:mucous membranes are moist  NECK: trachea midline LYMPH:  not examined BREAST:not examined LUNGS: not examined HEART: not examined ABDOMEN:not examined BACK: not examined EXTREMITIES:B/L 2+ pitting pedal edema     LABORATORY DATA: CBC    Component Value Date/Time   WBC 2.1* 09/26/2013 0824   RBC 2.50* 09/26/2013 0824   HGB 8.4* 09/26/2013 0824   HCT 25.2* 09/26/2013 0824   PLT 238 09/26/2013 0824   MCV 100.8* 09/26/2013 0824   MCH 33.6 09/26/2013 0824   MCHC 33.3 09/26/2013 0824   RDW 17.2* 09/26/2013 0824   LYMPHSABS 0.2* 09/22/2013 1009   MONOABS 0.3 09/22/2013 1009   EOSABS 0.0 09/22/2013 1009   BASOSABS 0.0 09/22/2013 1009      Chemistry      Component Value Date/Time   NA 140 09/25/2013 0711   K 4.3 09/25/2013 0711   CL 107 09/25/2013 0711   CO2 23 09/25/2013 0711   BUN 15 09/25/2013 0711   CREATININE 1.98* 09/25/2013 0711   CREATININE  2.03* 05/13/2013 0941      Component Value Date/Time   CALCIUM 9.6 09/25/2013 0711   ALKPHOS 39 09/22/2013 1009   AST 30 09/22/2013 1009   ALT 16 09/22/2013 1009   BILITOT 0.4 09/22/2013 1009        RADIOGRAPHIC STUDIES:  09/22/2013  CLINICAL DATA: Fall. Altered mental status.  EXAM:  CT HEAD WITHOUT CONTRAST  TECHNIQUE:  Contiguous axial images were obtained from the base of the skull  through the vertex without intravenous contrast.  COMPARISON: 08/27/2013 MR.  FINDINGS:  No skull fracture or intracranial hemorrhage.  Mild atrophy without hydrocephalus.  Small vessel disease type changes without CT evidence of large acute  infarct.  No intracranial mass lesion noted on this unenhanced exam.  Visualized orbital structures unremarkable.  IMPRESSION:  No skull fracture or intracranial hemorrhage.  Please see above.  Electronically Signed  By: Chauncey Cruel  M.D.  On: 09/22/2013 10:54    ASSESSMENT:  1. Stage IIIB squamous cell carcinoma with a 4.1 cm left lower lobe lesion, left hilar and bilateral mediastinal lymphadenopathy (questionable hepatic lesion and left upper lobe of lung lesion).  S/P Weekly carboplatin/Taxol with concurrent daily radiotherapy started on 08/07/2013 and finished on 09/12/2013.  Patient Active Problem List   Diagnosis Date Noted  . Radiation esophagitis 09/26/2013  . Protein-calorie malnutrition, severe 09/23/2013  . Dehydration 09/22/2013  . Odynophagia 09/22/2013  . AKI (acute kidney injury) 09/22/2013  . COPD, moderate 09/22/2013  . Squamous cell carcinoma of left lung 07/29/2013  . Hemoptysis 06/20/2013  . Anemia, iron deficiency 03/03/2013  . Coronary artery disease 02/28/2013  . Essential hypertension 02/28/2013  . Hyperlipidemia 02/28/2013  . Peripheral arterial disease 02/28/2013  . Other and unspecified hyperlipidemia 12/10/2012  . Generalized anxiety disorder 12/10/2012  . Insomnia 12/10/2012  . Esophageal reflux 12/10/2012  . Chronic renal insufficiency 11/07/2012  . Unspecified hypothyroidism 09/10/2012  . Pruritic dermatitis 09/10/2012  . HTN (hypertension) 09/10/2012     PLAN:  1. I personally reviewed and went over laboratory results with the patient.  The results are noted within this dictation. 2. I personally reviewed and went over radiographic studies with the patient.  The results are noted within this dictation.   3. Restaging scans as scheduled on 10/14/13 4. Tussionex 2.5 mL every 6 hours PRN 5. IV fluid, NS + K, tomorrow afternoon.  Will plan on fluids on Monday as well. 6. Return as scheduled on 10/15/2013    THERAPY PLAN:  We will restage her with scans as scheduled in a few week sand see her back.  We will support her symptomatically as she recovers from therapy.  All questions were answered. The patient knows to call the clinic with any problems, questions or concerns. We  can certainly see the patient much sooner if necessary.  Patient and plan discussed with Dr. Farrel Gobble and he is in agreement with the aforementioned.   I spent 25 minutes counseling the patient face to face. The total time spent in the appointment was 30 minutes.  KEFALAS,THOMAS 09/30/2013

## 2013-09-29 NOTE — Telephone Encounter (Signed)
Ok plus three monthly ref 

## 2013-09-29 NOTE — Progress Notes (Signed)
UR chart review completed.  

## 2013-09-30 ENCOUNTER — Encounter (HOSPITAL_COMMUNITY): Payer: Self-pay | Admitting: Oncology

## 2013-09-30 ENCOUNTER — Other Ambulatory Visit (HOSPITAL_COMMUNITY): Payer: Medicare Other

## 2013-09-30 ENCOUNTER — Encounter (HOSPITAL_COMMUNITY): Payer: Medicare Other | Attending: Hematology and Oncology | Admitting: Oncology

## 2013-09-30 VITALS — BP 112/66 | HR 101 | Temp 98.2°F | Resp 18 | Wt 150.5 lb

## 2013-09-30 DIAGNOSIS — C3490 Malignant neoplasm of unspecified part of unspecified bronchus or lung: Secondary | ICD-10-CM | POA: Insufficient documentation

## 2013-09-30 DIAGNOSIS — E43 Unspecified severe protein-calorie malnutrition: Secondary | ICD-10-CM | POA: Diagnosis not present

## 2013-09-30 DIAGNOSIS — C3492 Malignant neoplasm of unspecified part of left bronchus or lung: Secondary | ICD-10-CM

## 2013-09-30 DIAGNOSIS — E860 Dehydration: Secondary | ICD-10-CM | POA: Diagnosis not present

## 2013-09-30 DIAGNOSIS — C3430 Malignant neoplasm of lower lobe, unspecified bronchus or lung: Secondary | ICD-10-CM

## 2013-09-30 DIAGNOSIS — R638 Other symptoms and signs concerning food and fluid intake: Secondary | ICD-10-CM

## 2013-09-30 DIAGNOSIS — N189 Chronic kidney disease, unspecified: Secondary | ICD-10-CM

## 2013-09-30 MED ORDER — SODIUM CHLORIDE 0.9 % IV SOLN: INTRAVENOUS | Status: DC

## 2013-09-30 NOTE — Patient Instructions (Signed)
[facility] Discharge Instructions  RECOMMENDATIONS MADE BY THE CONSULTANT AND ANY TEST RESULTS WILL BE SENT TO YOUR REFERRING PHYSICIAN.  EXAM FINDINGS BY THE PHYSICIAN TODAY AND SIGNS OR SYMPTOMS TO REPORT TO CLINIC OR PRIMARY PHYSICIAN: Exam and findings as discussed by Meriel Flavors.  MEDICATIONS PRESCRIBED:  Continue as prescribed  INSTRUCTIONS/FOLLOW-UP: Lab work tomorrow prior to 1 liter fluid. We will plan for fluids on Monday if you are not drinking well over the weekend. Keep all other appointments as scheduled.  Thank you for choosing [facility] to provide your oncology and hematology care.  To afford each patient quality time with our providers, please arrive at least 15 minutes before your scheduled appointment time.  With your help, our goal is to use those 15 minutes to complete the necessary work-up to ensure our physicians have the information they need to help with your evaluation and healthcare recommendations.    Effective January 1st, 2014, we ask that you re-schedule your appointment with our physicians should you arrive 10 or more minutes late for your appointment.  We strive to give you quality time with our providers, and arriving late affects you and other patients whose appointments are after yours.    Again, thank you for choosing [facility].  Our hope is that these requests will decrease the amount of time that you wait before being seen by our physicians.       _____________________________________________________________  Should you have questions after your visit to [facility], please contact our office at [phone] between the hours of 8:30 a.m. and 5:00 p.m.  Voicemails left after 4:30 p.m. will not be returned until the following business day.  For prescription refill requests, have your pharmacy contact our office with your prescription refill request.

## 2013-10-01 ENCOUNTER — Encounter (HOSPITAL_BASED_OUTPATIENT_CLINIC_OR_DEPARTMENT_OTHER): Payer: Medicare Other

## 2013-10-01 VITALS — BP 115/59 | HR 81 | Temp 98.3°F | Resp 18

## 2013-10-01 DIAGNOSIS — C3490 Malignant neoplasm of unspecified part of unspecified bronchus or lung: Secondary | ICD-10-CM

## 2013-10-01 DIAGNOSIS — E43 Unspecified severe protein-calorie malnutrition: Secondary | ICD-10-CM | POA: Diagnosis not present

## 2013-10-01 DIAGNOSIS — E860 Dehydration: Secondary | ICD-10-CM | POA: Diagnosis not present

## 2013-10-01 DIAGNOSIS — D649 Anemia, unspecified: Secondary | ICD-10-CM

## 2013-10-01 DIAGNOSIS — R638 Other symptoms and signs concerning food and fluid intake: Secondary | ICD-10-CM

## 2013-10-01 LAB — CBC WITH DIFFERENTIAL/PLATELET
Basophils Absolute: 0 10*3/uL (ref 0.0–0.1)
Basophils Relative: 1 % (ref 0–1)
EOS PCT: 1 % (ref 0–5)
Eosinophils Absolute: 0 10*3/uL (ref 0.0–0.7)
HEMATOCRIT: 24.5 % — AB (ref 36.0–46.0)
Hemoglobin: 8.1 g/dL — ABNORMAL LOW (ref 12.0–15.0)
LYMPHS ABS: 0.3 10*3/uL — AB (ref 0.7–4.0)
LYMPHS PCT: 16 % (ref 12–46)
MCH: 33.9 pg (ref 26.0–34.0)
MCHC: 33.1 g/dL (ref 30.0–36.0)
MCV: 102.5 fL — ABNORMAL HIGH (ref 78.0–100.0)
MONO ABS: 0.3 10*3/uL (ref 0.1–1.0)
Monocytes Relative: 18 % — ABNORMAL HIGH (ref 3–12)
Neutro Abs: 1.2 10*3/uL — ABNORMAL LOW (ref 1.7–7.7)
Neutrophils Relative %: 66 % (ref 43–77)
Platelets: 267 10*3/uL (ref 150–400)
RBC: 2.39 MIL/uL — AB (ref 3.87–5.11)
RDW: 18.4 % — ABNORMAL HIGH (ref 11.5–15.5)
WBC: 1.9 10*3/uL — ABNORMAL LOW (ref 4.0–10.5)

## 2013-10-01 LAB — BASIC METABOLIC PANEL
BUN: 10 mg/dL (ref 6–23)
CALCIUM: 9.8 mg/dL (ref 8.4–10.5)
CO2: 25 meq/L (ref 19–32)
CREATININE: 1.97 mg/dL — AB (ref 0.50–1.10)
Chloride: 102 mEq/L (ref 96–112)
GFR calc Af Amer: 27 mL/min — ABNORMAL LOW (ref >90)
GFR calc non Af Amer: 23 mL/min — ABNORMAL LOW (ref >90)
Glucose, Bld: 113 mg/dL — ABNORMAL HIGH (ref 70–99)
Potassium: 3.6 mEq/L — ABNORMAL LOW (ref 3.7–5.3)
Sodium: 139 mEq/L (ref 137–147)

## 2013-10-01 LAB — ABO/RH: ABO/RH(D): O POS

## 2013-10-01 LAB — PREPARE RBC (CROSSMATCH)

## 2013-10-01 MED ORDER — SODIUM CHLORIDE 0.9 % IV SOLN
INTRAVENOUS | Status: DC
  Administered 2013-10-01: 14:00:00 via INTRAVENOUS
  Filled 2013-10-01 (×9): qty 1000

## 2013-10-01 MED ORDER — HEPARIN SOD (PORK) LOCK FLUSH 100 UNIT/ML IV SOLN
INTRAVENOUS | Status: AC
Start: 2013-10-01 — End: 2013-10-01
  Filled 2013-10-01: qty 5

## 2013-10-01 MED ORDER — HEPARIN SOD (PORK) LOCK FLUSH 100 UNIT/ML IV SOLN
INTRAVENOUS | Status: AC
  Filled 2013-10-01: qty 5

## 2013-10-01 MED ORDER — SODIUM CHLORIDE 0.9 % IV SOLN
Freq: Once | INTRAVENOUS | Status: AC
  Administered 2013-10-01: 12:00:00 via INTRAVENOUS

## 2013-10-01 MED ORDER — HEPARIN SOD (PORK) LOCK FLUSH 100 UNIT/ML IV SOLN
500.0000 [IU] | Freq: Once | INTRAVENOUS | Status: AC
  Administered 2013-10-01: 500 [IU] via INTRAVENOUS

## 2013-10-01 NOTE — Progress Notes (Signed)
.  [name] here today for IV fluids. Reports no change in condition from yesterday. Tolerated infusion well, will return tomorrow for blood transfusion.

## 2013-10-02 ENCOUNTER — Encounter (HOSPITAL_BASED_OUTPATIENT_CLINIC_OR_DEPARTMENT_OTHER): Payer: Medicare Other

## 2013-10-02 VITALS — BP 129/64 | HR 89 | Temp 97.4°F | Resp 18

## 2013-10-02 DIAGNOSIS — C3490 Malignant neoplasm of unspecified part of unspecified bronchus or lung: Secondary | ICD-10-CM

## 2013-10-02 DIAGNOSIS — N189 Chronic kidney disease, unspecified: Secondary | ICD-10-CM

## 2013-10-02 DIAGNOSIS — D509 Iron deficiency anemia, unspecified: Secondary | ICD-10-CM

## 2013-10-02 DIAGNOSIS — D649 Anemia, unspecified: Secondary | ICD-10-CM

## 2013-10-02 MED ORDER — ACETAMINOPHEN 325 MG PO TABS
650.0000 mg | ORAL_TABLET | Freq: Once | ORAL | Status: AC
  Administered 2013-10-02: 650 mg via ORAL
  Filled 2013-10-02: qty 2

## 2013-10-02 MED ORDER — HEPARIN SOD (PORK) LOCK FLUSH 100 UNIT/ML IV SOLN
500.0000 [IU] | Freq: Every day | INTRAVENOUS | Status: AC | PRN
  Administered 2013-10-02: 500 [IU]

## 2013-10-02 MED ORDER — DIPHENHYDRAMINE HCL 25 MG PO CAPS
25.0000 mg | ORAL_CAPSULE | Freq: Once | ORAL | Status: AC
  Administered 2013-10-02: 25 mg via ORAL
  Filled 2013-10-02: qty 1

## 2013-10-02 MED ORDER — SODIUM CHLORIDE 0.9 % IJ SOLN
10.0000 mL | INTRAMUSCULAR | Status: AC | PRN
  Administered 2013-10-02: 10 mL

## 2013-10-02 MED ORDER — SODIUM CHLORIDE 0.9 % IV SOLN
250.0000 mL | Freq: Once | INTRAVENOUS | Status: AC
  Administered 2013-10-02: 250 mL via INTRAVENOUS

## 2013-10-02 MED ORDER — FUROSEMIDE 20 MG PO TABS
20.0000 mg | ORAL_TABLET | Freq: Once | ORAL | Status: AC
  Administered 2013-10-02: 20 mg via ORAL
  Filled 2013-10-02: qty 1

## 2013-10-02 MED ORDER — TRAMADOL HCL 50 MG PO TABS
50.0000 mg | ORAL_TABLET | Freq: Once | ORAL | Status: AC
  Administered 2013-10-02: 50 mg via ORAL
  Filled 2013-10-02: qty 1

## 2013-10-02 MED ORDER — HEPARIN SOD (PORK) LOCK FLUSH 100 UNIT/ML IV SOLN
INTRAVENOUS | Status: AC
  Filled 2013-10-02: qty 5

## 2013-10-02 NOTE — Addendum Note (Signed)
Addended by: Kurtis Bushman A on: 10/02/2013 02:37 PM   Modules accepted: Orders, SmartSet

## 2013-10-02 NOTE — Progress Notes (Signed)
.  [name] tolerating blood infusion well. Home with nephew. No c/o voiced

## 2013-10-03 LAB — TYPE AND SCREEN
ABO/RH(D): O POS
Antibody Screen: NEGATIVE
Unit division: 0
Unit division: 0

## 2013-10-06 ENCOUNTER — Encounter (HOSPITAL_BASED_OUTPATIENT_CLINIC_OR_DEPARTMENT_OTHER): Payer: Medicare Other

## 2013-10-06 VITALS — BP 144/73 | HR 98 | Temp 97.6°F | Resp 18

## 2013-10-06 DIAGNOSIS — E43 Unspecified severe protein-calorie malnutrition: Secondary | ICD-10-CM | POA: Diagnosis not present

## 2013-10-06 DIAGNOSIS — C3430 Malignant neoplasm of lower lobe, unspecified bronchus or lung: Secondary | ICD-10-CM

## 2013-10-06 DIAGNOSIS — C3492 Malignant neoplasm of unspecified part of left bronchus or lung: Secondary | ICD-10-CM

## 2013-10-06 DIAGNOSIS — R600 Localized edema: Secondary | ICD-10-CM

## 2013-10-06 DIAGNOSIS — E860 Dehydration: Secondary | ICD-10-CM

## 2013-10-06 DIAGNOSIS — Z95828 Presence of other vascular implants and grafts: Secondary | ICD-10-CM

## 2013-10-06 MED ORDER — FUROSEMIDE 40 MG PO TABS
40.0000 mg | ORAL_TABLET | Freq: Every day | ORAL | Status: DC
  Administered 2013-10-06: 40 mg via ORAL
  Filled 2013-10-06 (×2): qty 1

## 2013-10-06 MED ORDER — HEPARIN SOD (PORK) LOCK FLUSH 100 UNIT/ML IV SOLN
INTRAVENOUS | Status: AC
  Filled 2013-10-06: qty 5

## 2013-10-06 MED ORDER — HEPARIN SOD (PORK) LOCK FLUSH 100 UNIT/ML IV SOLN
500.0000 [IU] | Freq: Once | INTRAVENOUS | Status: AC
  Administered 2013-10-06: 500 [IU] via INTRAVENOUS

## 2013-10-06 MED ORDER — SODIUM CHLORIDE 0.9 % IV SOLN
INTRAVENOUS | Status: DC
  Administered 2013-10-06: 12:00:00 via INTRAVENOUS
  Filled 2013-10-06 (×10): qty 1000

## 2013-10-06 MED ORDER — SODIUM CHLORIDE 0.9 % IJ SOLN
10.0000 mL | Freq: Once | INTRAMUSCULAR | Status: AC
  Administered 2013-10-06: 10 mL via INTRAVENOUS

## 2013-10-06 NOTE — Progress Notes (Signed)
Tolerated well

## 2013-10-09 ENCOUNTER — Encounter (HOSPITAL_BASED_OUTPATIENT_CLINIC_OR_DEPARTMENT_OTHER): Payer: Medicare Other

## 2013-10-09 VITALS — BP 141/70 | HR 97 | Temp 98.7°F | Resp 20

## 2013-10-09 DIAGNOSIS — C3492 Malignant neoplasm of unspecified part of left bronchus or lung: Secondary | ICD-10-CM

## 2013-10-09 DIAGNOSIS — Z5189 Encounter for other specified aftercare: Secondary | ICD-10-CM | POA: Diagnosis not present

## 2013-10-09 DIAGNOSIS — C3430 Malignant neoplasm of lower lobe, unspecified bronchus or lung: Secondary | ICD-10-CM | POA: Diagnosis not present

## 2013-10-09 MED ORDER — SODIUM CHLORIDE 0.9 % IJ SOLN
10.0000 mL | INTRAMUSCULAR | Status: DC | PRN
  Administered 2013-10-09: 10 mL via INTRAVENOUS

## 2013-10-09 MED ORDER — HEPARIN SOD (PORK) LOCK FLUSH 100 UNIT/ML IV SOLN
500.0000 [IU] | Freq: Once | INTRAVENOUS | Status: AC
  Administered 2013-10-09: 500 [IU] via INTRAVENOUS

## 2013-10-09 MED ORDER — HEPARIN SOD (PORK) LOCK FLUSH 100 UNIT/ML IV SOLN
INTRAVENOUS | Status: AC
  Filled 2013-10-09: qty 5

## 2013-10-09 MED ORDER — SODIUM CHLORIDE 0.9 % IV SOLN
INTRAVENOUS | Status: DC
  Administered 2013-10-09: 12:00:00 via INTRAVENOUS
  Filled 2013-10-09 (×10): qty 1000

## 2013-10-12 NOTE — Progress Notes (Signed)
Leah Battiest, MD [place] [address]  Squamous cell carcinoma of left lung - Plan: MR Abdomen W Wo Contrast, CBC with Differential, Comprehensive metabolic panel  Hepatic lesion - Plan: MR Abdomen W Wo Contrast  CURRENT THERAPY: Restaging work-up  INTERVAL HISTORY: [name] 79 y.o. female returns for  regular  visit for followup of Stage IIIB squamous cell carcinoma with a 4.1 cm left lower lobe lesion, left hilar and bilateral mediastinal lymphadenopathy (questionable hepatic lesion and left upper lobe of lung lesion). S/P Weekly carboplatin/Taxol with concurrent daily radiotherapy started on 08/07/2013 and finished on 09/12/2013.    Squamous cell carcinoma of left lung   06/30/2013 Imaging CT of chest demonstrates 4 cm left lower lobe lesion with other concerning findings for malignancy   07/09/2013 Imaging PET- 4.1 cm left lower oobe lesion with left hilar and bilateral mediastinal lymphadeopathy.  Hepatic lesion not confidently identified.  1.8 cm nodule in left upper lobe, cannot exclude low grade tumor.   07/16/2013 Initial Diagnosis Squamous cell carcinoma of left lung via CT-guided biopsy by IR   07/29/2013 Surgery IR placement of port-a-cath in preparation for concomittant chemoradiation    08/08/2013 - 09/12/2013 Chemotherapy Paclitaxel/Carboplatin weekly x 6   08/08/2013 - 09/19/2013 Radiation Therapy    10/12/2013 Imaging    I personally reviewed and went over laboratory results with the patient.  The results are noted within this dictation.  I personally reviewed and went over radiographic studies with the patient.  The results are noted within this dictation.  Restaging CT scans performed on 4/21 demonstrates: 3.2 cm posterior left lower lobe cavitary mass, corresponding to  known primary bronchogenic neoplasm, improved. Additional pulmonary  nodules are unchanged.  Small mediastinal nodes measuring up to 8 mm, hypermetabolic on  prior PET. Prior hilar  lymphadenopathy is not well visualized on  unenhanced CT.  Two hypodense hepatic lesions measuring up to 12 mm, poorly  evaluated. Consider MRI abdomen (without contrast given GFR) for  further evaluation as clinically warranted.  Additional stable ancillary findings as above.  I recommend that the patient have an MRI of abdomen with particular attention to the liver to further characterize the lesions noted above.  If these are found to be malignant, this would upstage her disease and she would require chemotherapy for metastatic disease.  If negative, it would be reasonable, given the patient co-morbidities and performance status, to repeat CT scans to monitor lung mass.   She has a few complaints along with her family: 1. Pedal edema, it is 1-2+ pitting edema.  I would hold off on Lasix as this may dehydrate her while she does not have good oral intake.  I recommend LE elevation. 2. "I feel like I stepped on something on my right foot," exam is negative.  Palpation is none tender. 3. Continued clear sputum/saliva production- secondary to radiation therapy.  This will resolve in time.  She reports that she continues to have poor oral intake of fluids, but it is slowly improving.  She wishes to continue with IV fluids bi-weekly.  We will get this scheduled for her.   Oncologically, she denies any complaints and ROS questioning is negative.   Past Medical History  Diagnosis Date  . Hypertension   . Hypothyroidism   . Hyperlipidemia   . Insomnia   . GERD (gastroesophageal reflux disease)     chronic gastritis  . Adrenal adenoma   . Chronic diarrhea   . Fatty  liver   . Arthritis     knee  . QT prolongation     syncope  . Coronary artery disease   . Impaired fasting glucose   . Renal insufficiency     low protien diet  . Tobacco use     1/2 ppd, approx 25-50 pack years (as of 08/2012)  . Family history of heart disease   . IBS (irritable bowel syndrome)   . Peripheral arterial  disease     sstatus post  infrarenal abdominal aortic tube graft placed by Dr. Victorino Dike February 2008  . Cancer     Lung  . Squamous cell carcinoma of left lung 07/29/2013    has Unspecified hypothyroidism; Pruritic dermatitis; HTN (hypertension); Chronic renal insufficiency; Other and unspecified hyperlipidemia; Generalized anxiety disorder; Insomnia; Esophageal reflux; Coronary artery disease; Essential hypertension; Hyperlipidemia; Peripheral arterial disease; Anemia, iron deficiency; Hemoptysis; Squamous cell carcinoma of left lung; Dehydration; Odynophagia; AKI (acute kidney injury); COPD, moderate; Protein-calorie malnutrition, severe; and Radiation esophagitis on her problem list.     is allergic to aleve; dilaudid; dyazide; and zithromax.  [name] does not currently have medications on file.  Past Surgical History  Procedure Laterality Date  . Abdominal hysterectomy    . Thyroidectomy, partial    . Colonoscopy  5/04    normal  . Abdominal aortic aneurysm repair  07/2006    D. J.D. Kellie Simmering  . Cataract extraction Bilateral 2010  . Cardiac catheterization  02/2010    mod CAD in L-dominant system with normal LV function  . Colon resection  2005    1/2 colon removed  . Transthoracic echocardiogram  02/2010    EF 55-60%; mild conc LVH, normal systolic function; mildly calcified AV annulus  . Colonoscopy with esophagogastroduodenoscopy (egd) N/A 03/19/2013    Procedure: COLONOSCOPY WITH ESOPHAGOGASTRODUODENOSCOPY (EGD);  Surgeon: Rogene Houston, MD;  Location: [facility];  Service: Endoscopy;  Laterality: N/A;  145  . Dg biopsy lung Left Jan 2015    Denies any headaches, dizziness, double vision, fevers, chills, night sweats, nausea, vomiting, diarrhea, constipation, chest pain, heart palpitations, shortness of breath, blood in stool, black tarry stool, urinary pain, urinary burning, urinary frequency, hematuria.   PHYSICAL EXAMINATION  ECOG PERFORMANCE STATUS: 3 -  Symptomatic, >50% confined to bed  Filed Vitals:   10/15/13 1000  BP: 142/72  Pulse: 88  Temp: 98.4 F (36.9 C)  Resp: 18    GENERAL:alert, obese, smiling and tired SKIN: skin color, texture, turgor are normal, no rashes or significant lesions HEAD: Normocephalic, No masses, lesions, tenderness or abnormalities EYES: normal, PERRLA, EOMI, Conjunctiva are pink and non-injected EARS: External ears normal OROPHARYNX:mucous membranes are moist  NECK: supple, trachea midline LYMPH:  not examined BREAST:not examined LUNGS: not examined HEART: not examined ABDOMEN:not examined BACK: Back symmetric, no curvature. EXTREMITIES:less then 2 second capillary refill, no skin discoloration, no cyanosis, positive findings:  edema B/L pedal edema  NEURO: alert & oriented x 3 with fluent speech, no focal motor/sensory deficits, in a wheelchair    LABORATORY DATA: CBC    Component Value Date/Time   WBC 1.9* 10/01/2013 1150   RBC 2.39* 10/01/2013 1150   HGB 8.1* 10/01/2013 1150   HCT 24.5* 10/01/2013 1150   PLT 267 10/01/2013 1150   MCV 102.5* 10/01/2013 1150   MCH 33.9 10/01/2013 1150   MCHC 33.1 10/01/2013 1150   RDW 18.4* 10/01/2013 1150   LYMPHSABS 0.3* 10/01/2013 1150   MONOABS 0.3 10/01/2013 1150  EOSABS 0.0 10/01/2013 1150   BASOSABS 0.0 10/01/2013 1150      Chemistry      Component Value Date/Time   NA 139 10/01/2013 1150   K 3.6* 10/01/2013 1150   CL 102 10/01/2013 1150   CO2 25 10/01/2013 1150   BUN 10 10/01/2013 1150   CREATININE 1.97* 10/01/2013 1150   CREATININE 2.03* 05/13/2013 0941      Component Value Date/Time   CALCIUM 9.8 10/01/2013 1150   ALKPHOS 39 09/22/2013 1009   AST 30 09/22/2013 1009   ALT 16 09/22/2013 1009   BILITOT 0.4 09/22/2013 1009       RADIOGRAPHIC STUDIES:  10/14/2013  CLINICAL DATA: Restaging non-small cell lung cancer  EXAM:  CT CHEST, ABDOMEN AND PELVIS WITHOUT CONTRAST  TECHNIQUE:  Multidetector CT imaging of the chest, abdomen and pelvis was  performed following  the standard protocol without IV contrast.  COMPARISON: PET-CT dated 07/09/2013  FINDINGS:  CT CHEST FINDINGS  2.2 x 3.2 cm posterior left lower lobe cavitary mass (series 3/  image 46), previously 3.7 x 4.1 cm, corresponding to known primary  bronchogenic neoplasm.  Additional 1.8 x 0.8 cm ground-glass nodular opacity in the  subpleural left upper lobe (series 3/ image 24), unchanged.  Additional mild nodularity in the left upper lobe (series 3/images  16 and 24).  Moderate centrilobular and paraseptal emphysematous changes. No  pleural effusion or pneumothorax.  The heart is normal in size. No pericardial effusion. Coronary  atherosclerosis. Atherosclerotic calcifications of the aortic arch.  Small mediastinal lymph nodes, including an 8 mm short axis  precarinal node (series 2/ image 27) and an 8 mm short axis  subcarinal node (series 2/image 31). Prior bilateral hilar nodes are  not well visualized on unenhanced CT. No suspicious axillary  lymphadenopathy.  Right chest port terminating in the SVC.  Degenerative changes of the thoracic spine.  CT ABDOMEN AND PELVIS FINDINGS  Two hypodense hepatic lesions measuring 12 mm in the anterior  segment right hepatic lobe (series 2/ image 52) and 10 mm in the  medial segment left hepatic lobe (series 2/image 58), poorly  visualized on unenhanced CT.  Spleen, pancreas, and right adrenal gland are within normal limits.  3.3 x 3.0 cm left adrenal adenoma (series 2/image 29).  Layering gallstone (series 2/ image 70), without associated  inflammatory changes.  Kidneys are unremarkable. No renal calculi or hydronephrosis.  Status post right hemicolectomy with ileocolic anastomosis in the  left mid abdomen.  Infrarenal abdominal aortic graft. Atherosclerotic calcifications of  the abdominal aorta and branch vessels.  No abdominopelvic ascites.  No suspicious abdominopelvic lymphadenopathy.  Status post hysterectomy. No adnexal masses.    Bladder is within normal limits.  Degenerative changes of the lumbar spine.  IMPRESSION:  3.2 cm posterior left lower lobe cavitary mass, corresponding to  known primary bronchogenic neoplasm, improved. Additional pulmonary  nodules are unchanged.  Small mediastinal nodes measuring up to 8 mm, hypermetabolic on  prior PET. Prior hilar lymphadenopathy is not well visualized on  unenhanced CT.  Two hypodense hepatic lesions measuring up to 12 mm, poorly  evaluated. Consider MRI abdomen (without contrast given GFR) for  further evaluation as clinically warranted.  Additional stable ancillary findings as above.  Electronically Signed  By: Julian Hy M.D.  On: 10/14/2013 12:21     ASSESSMENT:  1. Stage IIIB squamous cell carcinoma with a 4.1 cm left lower lobe lesion, left hilar and bilateral mediastinal lymphadenopathy (questionable hepatic lesion and left  upper lobe of lung lesion). S/P Weekly carboplatin/Taxol with concurrent daily radiotherapy started on 08/07/2013 and finished on 09/12/2013. 2. Increased production of clear sputum, secondary to radiation 3. "I stepped on something on my right foot," negative exam 4. Pedal edema 5. Hepatic lesions, will work-up with MRI. 6. ECOG 3 7. Failure to thrive  Patient Active Problem List   Diagnosis Date Noted  . Radiation esophagitis 09/26/2013  . Protein-calorie malnutrition, severe 09/23/2013  . Dehydration 09/22/2013  . Odynophagia 09/22/2013  . AKI (acute kidney injury) 09/22/2013  . COPD, moderate 09/22/2013  . Squamous cell carcinoma of left lung 07/29/2013  . Hemoptysis 06/20/2013  . Anemia, iron deficiency 03/03/2013  . Coronary artery disease 02/28/2013  . Essential hypertension 02/28/2013  . Hyperlipidemia 02/28/2013  . Peripheral arterial disease 02/28/2013  . Other and unspecified hyperlipidemia 12/10/2012  . Generalized anxiety disorder 12/10/2012  . Insomnia 12/10/2012  . Esophageal reflux 12/10/2012  .  Chronic renal insufficiency 11/07/2012  . Unspecified hypothyroidism 09/10/2012  . Pruritic dermatitis 09/10/2012  . HTN (hypertension) 09/10/2012     PLAN:  1. I personally reviewed and went over laboratory results with the patient. The results are noted within this dictation.  2. I personally reviewed and went over radiographic studies with the patient. The results are noted within this dictation.  3. MRI abdomen with and without contrast with particular attention to the liver lesions noted on CT imaging. 4. IV fluids Bi-weekly 5. Labs today: CBC diff, CMET 6. Continue to hold Lasix, due to poor PO intake of fluids. 7. Recommend elevation of LE when resting 8. Rad Onc appointment on 4/24 as scheduled.  9. Recommend increased meal supplements 10. Return in 3 weeks for follow-up   THERAPY PLAN:  We will perform an MRI of abdomen to evaluate hepatis lesions noted on restaging CT scan.  If negative, then we will follow pulmonary mass.  If positive, she will need a small break in therapy and consideration for metastatic treatment.  Will continue symptom management.  All questions were answered. The patient knows to call the clinic with any problems, questions or concerns. We can certainly see the patient much sooner if necessary.  Patient and plan discussed with Dr. Farrel Gobble and he is in agreement with the aforementioned.    [facility] 10/15/2013

## 2013-10-14 ENCOUNTER — Other Ambulatory Visit (HOSPITAL_COMMUNITY): Payer: Self-pay | Admitting: Oncology

## 2013-10-14 ENCOUNTER — Ambulatory Visit (HOSPITAL_COMMUNITY)
Admission: RE | Admit: 2013-10-14 | Discharge: 2013-10-14 | Disposition: A | Discharge status: Home / Self Care | Payer: Medicare Other | Source: Ambulatory Visit | Attending: Oncology | Admitting: Oncology

## 2013-10-14 DIAGNOSIS — C3492 Malignant neoplasm of unspecified part of left bronchus or lung: Secondary | ICD-10-CM

## 2013-10-14 DIAGNOSIS — C3430 Malignant neoplasm of lower lobe, unspecified bronchus or lung: Secondary | ICD-10-CM | POA: Diagnosis not present

## 2013-10-14 DIAGNOSIS — C3490 Malignant neoplasm of unspecified part of unspecified bronchus or lung: Secondary | ICD-10-CM | POA: Diagnosis not present

## 2013-10-15 ENCOUNTER — Encounter (HOSPITAL_BASED_OUTPATIENT_CLINIC_OR_DEPARTMENT_OTHER): Payer: Medicare Other

## 2013-10-15 ENCOUNTER — Encounter (HOSPITAL_COMMUNITY): Payer: Medicare Other | Attending: Oncology | Admitting: Oncology

## 2013-10-15 ENCOUNTER — Encounter (HOSPITAL_COMMUNITY): Payer: Self-pay | Admitting: Oncology

## 2013-10-15 VITALS — BP 142/72 | HR 88 | Temp 98.4°F | Resp 18 | Wt 150.1 lb

## 2013-10-15 DIAGNOSIS — E039 Hypothyroidism, unspecified: Secondary | ICD-10-CM

## 2013-10-15 DIAGNOSIS — C3490 Malignant neoplasm of unspecified part of unspecified bronchus or lung: Secondary | ICD-10-CM | POA: Diagnosis not present

## 2013-10-15 DIAGNOSIS — R638 Other symptoms and signs concerning food and fluid intake: Secondary | ICD-10-CM | POA: Diagnosis not present

## 2013-10-15 DIAGNOSIS — C3430 Malignant neoplasm of lower lobe, unspecified bronchus or lung: Secondary | ICD-10-CM | POA: Diagnosis not present

## 2013-10-15 DIAGNOSIS — K7689 Other specified diseases of liver: Secondary | ICD-10-CM | POA: Insufficient documentation

## 2013-10-15 DIAGNOSIS — K769 Liver disease, unspecified: Secondary | ICD-10-CM

## 2013-10-15 DIAGNOSIS — N289 Disorder of kidney and ureter, unspecified: Secondary | ICD-10-CM | POA: Diagnosis not present

## 2013-10-15 DIAGNOSIS — IMO0002 Reserved for concepts with insufficient information to code with codable children: Secondary | ICD-10-CM

## 2013-10-15 DIAGNOSIS — C3492 Malignant neoplasm of unspecified part of left bronchus or lung: Secondary | ICD-10-CM

## 2013-10-15 LAB — CBC WITH DIFFERENTIAL/PLATELET
Basophils Absolute: 0 10*3/uL (ref 0.0–0.1)
Basophils Relative: 1 % (ref 0–1)
Eosinophils Absolute: 0 10*3/uL (ref 0.0–0.7)
Eosinophils Relative: 1 % (ref 0–5)
HCT: 35.6 % — ABNORMAL LOW (ref 36.0–46.0)
Hemoglobin: 11.7 g/dL — ABNORMAL LOW (ref 12.0–15.0)
LYMPHS ABS: 0.6 10*3/uL — AB (ref 0.7–4.0)
Lymphocytes Relative: 19 % (ref 12–46)
MCH: 32.5 pg (ref 26.0–34.0)
MCHC: 32.9 g/dL (ref 30.0–36.0)
MCV: 98.9 fL (ref 78.0–100.0)
Monocytes Absolute: 0.4 10*3/uL (ref 0.1–1.0)
Monocytes Relative: 13 % — ABNORMAL HIGH (ref 3–12)
NEUTROS PCT: 66 % (ref 43–77)
Neutro Abs: 1.9 10*3/uL (ref 1.7–7.7)
Platelets: 241 10*3/uL (ref 150–400)
RBC: 3.6 MIL/uL — AB (ref 3.87–5.11)
RDW: 18 % — ABNORMAL HIGH (ref 11.5–15.5)
WBC: 2.9 10*3/uL — ABNORMAL LOW (ref 4.0–10.5)

## 2013-10-15 LAB — COMPREHENSIVE METABOLIC PANEL
ALT: 15 U/L (ref 0–35)
AST: 37 U/L (ref 0–37)
Albumin: 3.6 g/dL (ref 3.5–5.2)
Alkaline Phosphatase: 42 U/L (ref 39–117)
BILIRUBIN TOTAL: 0.6 mg/dL (ref 0.3–1.2)
BUN: 11 mg/dL (ref 6–23)
CO2: 28 mEq/L (ref 19–32)
Calcium: 10 mg/dL (ref 8.4–10.5)
Chloride: 99 mEq/L (ref 96–112)
Creatinine, Ser: 1.58 mg/dL — ABNORMAL HIGH (ref 0.50–1.10)
GFR calc non Af Amer: 31 mL/min — ABNORMAL LOW (ref >90)
GFR, EST AFRICAN AMERICAN: 36 mL/min — AB (ref >90)
GLUCOSE: 90 mg/dL (ref 70–99)
Potassium: 3.3 mEq/L — ABNORMAL LOW (ref 3.7–5.3)
Sodium: 139 mEq/L (ref 137–147)
TOTAL PROTEIN: 6.7 g/dL (ref 6.0–8.3)

## 2013-10-15 NOTE — Patient Instructions (Signed)
[place] Discharge Instructions  RECOMMENDATIONS MADE BY THE CONSULTANT AND ANY TEST RESULTS WILL BE SENT TO YOUR REFERRING PHYSICIAN.  We will give your fluids twice a week for 3 weeks.  It will be on Tuesday and Fridays. You will be seen by the doctor on Friday, May 15th. MRI on May 1st , be here at 8:45, it is before your office visit. Please do not eat or drink before your scan. Continue to hold your Lasix.  Thank you for choosing [facility] to provide your oncology and hematology care.  To afford each patient quality time with our providers, please arrive at least 15 minutes before your scheduled appointment time.  With your help, our goal is to use those 15 minutes to complete the necessary work-up to ensure our physicians have the information they need to help with your evaluation and healthcare recommendations.    Effective January 1st, 2014, we ask that you re-schedule your appointment with our physicians should you arrive 10 or more minutes late for your appointment.  We strive to give you quality time with our providers, and arriving late affects you and other patients whose appointments are after yours.    Again, thank you for choosing [facility].  Our hope is that these requests will decrease the amount of time that you wait before being seen by our physicians.       _____________________________________________________________  Should you have questions after your visit to [facility] please contact our office at [phone] between the hours of 8:30 a.m. and 5:00 p.m.  Voicemails left after 4:30 p.m. will not be returned until the following business day.  For prescription refill requests, have your pharmacy contact our office with your prescription refill request.

## 2013-10-15 NOTE — Progress Notes (Signed)
Labs drawn today for cbc/diff,cmp 

## 2013-10-16 ENCOUNTER — Encounter (HOSPITAL_BASED_OUTPATIENT_CLINIC_OR_DEPARTMENT_OTHER): Payer: Medicare Other

## 2013-10-16 VITALS — BP 140/78 | HR 85 | Temp 97.6°F | Resp 20

## 2013-10-16 DIAGNOSIS — R638 Other symptoms and signs concerning food and fluid intake: Secondary | ICD-10-CM

## 2013-10-16 DIAGNOSIS — C3430 Malignant neoplasm of lower lobe, unspecified bronchus or lung: Secondary | ICD-10-CM | POA: Diagnosis not present

## 2013-10-16 DIAGNOSIS — C3492 Malignant neoplasm of unspecified part of left bronchus or lung: Secondary | ICD-10-CM

## 2013-10-16 DIAGNOSIS — IMO0002 Reserved for concepts with insufficient information to code with codable children: Secondary | ICD-10-CM

## 2013-10-16 MED ORDER — SODIUM CHLORIDE 0.9 % IV SOLN
INTRAVENOUS | Status: DC
  Administered 2013-10-16: 11:00:00 via INTRAVENOUS
  Filled 2013-10-16 (×8): qty 1000

## 2013-10-16 MED ORDER — HEPARIN SOD (PORK) LOCK FLUSH 100 UNIT/ML IV SOLN
500.0000 [IU] | Freq: Once | INTRAVENOUS | Status: AC
  Administered 2013-10-16: 500 [IU] via INTRAVENOUS
  Filled 2013-10-16: qty 5

## 2013-10-16 MED ORDER — SODIUM CHLORIDE 0.9 % IJ SOLN
10.0000 mL | Freq: Once | INTRAMUSCULAR | Status: AC
  Administered 2013-10-16: 10 mL via INTRAVENOUS

## 2013-10-16 NOTE — Progress Notes (Signed)
Tolerated well

## 2013-10-20 DIAGNOSIS — Z923 Personal history of irradiation: Secondary | ICD-10-CM | POA: Diagnosis not present

## 2013-10-20 DIAGNOSIS — C3490 Malignant neoplasm of unspecified part of unspecified bronchus or lung: Secondary | ICD-10-CM | POA: Diagnosis not present

## 2013-10-21 ENCOUNTER — Encounter (HOSPITAL_BASED_OUTPATIENT_CLINIC_OR_DEPARTMENT_OTHER): Payer: Medicare Other

## 2013-10-21 VITALS — BP 145/73 | HR 85 | Temp 98.6°F | Resp 16 | Wt 147.8 lb

## 2013-10-21 DIAGNOSIS — IMO0002 Reserved for concepts with insufficient information to code with codable children: Secondary | ICD-10-CM

## 2013-10-21 DIAGNOSIS — C3430 Malignant neoplasm of lower lobe, unspecified bronchus or lung: Secondary | ICD-10-CM | POA: Diagnosis not present

## 2013-10-21 DIAGNOSIS — C3492 Malignant neoplasm of unspecified part of left bronchus or lung: Secondary | ICD-10-CM

## 2013-10-21 DIAGNOSIS — R638 Other symptoms and signs concerning food and fluid intake: Secondary | ICD-10-CM | POA: Diagnosis not present

## 2013-10-21 MED ORDER — SODIUM CHLORIDE 0.9 % IV SOLN
INTRAVENOUS | Status: DC
  Administered 2013-10-21: 12:00:00 via INTRAVENOUS
  Filled 2013-10-21 (×8): qty 1000

## 2013-10-21 MED ORDER — HEPARIN SOD (PORK) LOCK FLUSH 100 UNIT/ML IV SOLN
500.0000 [IU] | Freq: Once | INTRAVENOUS | Status: AC
  Administered 2013-10-21: 500 [IU] via INTRAVENOUS
  Filled 2013-10-21: qty 5

## 2013-10-21 MED ORDER — SODIUM CHLORIDE 0.9 % IJ SOLN
10.0000 mL | Freq: Once | INTRAMUSCULAR | Status: AC
  Administered 2013-10-21: 10 mL via INTRAVENOUS

## 2013-10-24 ENCOUNTER — Ambulatory Visit (HOSPITAL_COMMUNITY)
Admission: RE | Admit: 2013-10-24 | Discharge: 2013-10-24 | Disposition: A | Discharge status: Home / Self Care | Payer: Medicare Other | Source: Ambulatory Visit | Attending: Oncology | Admitting: Oncology

## 2013-10-24 ENCOUNTER — Encounter (HOSPITAL_COMMUNITY): Payer: Medicare Other | Attending: Hematology and Oncology

## 2013-10-24 VITALS — BP 139/71 | HR 84 | Temp 97.9°F | Resp 18 | Wt 151.2 lb

## 2013-10-24 DIAGNOSIS — K7689 Other specified diseases of liver: Secondary | ICD-10-CM | POA: Diagnosis not present

## 2013-10-24 DIAGNOSIS — D3500 Benign neoplasm of unspecified adrenal gland: Secondary | ICD-10-CM | POA: Diagnosis not present

## 2013-10-24 DIAGNOSIS — K8020 Calculus of gallbladder without cholecystitis without obstruction: Secondary | ICD-10-CM | POA: Insufficient documentation

## 2013-10-24 DIAGNOSIS — K769 Liver disease, unspecified: Secondary | ICD-10-CM

## 2013-10-24 DIAGNOSIS — C3490 Malignant neoplasm of unspecified part of unspecified bronchus or lung: Secondary | ICD-10-CM | POA: Insufficient documentation

## 2013-10-24 DIAGNOSIS — C3492 Malignant neoplasm of unspecified part of left bronchus or lung: Secondary | ICD-10-CM

## 2013-10-24 DIAGNOSIS — IMO0002 Reserved for concepts with insufficient information to code with codable children: Secondary | ICD-10-CM

## 2013-10-24 DIAGNOSIS — R638 Other symptoms and signs concerning food and fluid intake: Secondary | ICD-10-CM | POA: Diagnosis not present

## 2013-10-24 DIAGNOSIS — Z95828 Presence of other vascular implants and grafts: Secondary | ICD-10-CM

## 2013-10-24 DIAGNOSIS — C3430 Malignant neoplasm of lower lobe, unspecified bronchus or lung: Secondary | ICD-10-CM | POA: Diagnosis not present

## 2013-10-24 MED ORDER — HYDROCODONE-ACETAMINOPHEN 10-325 MG PO TABS: 1.0000 | ORAL_TABLET | ORAL | Status: DC | PRN

## 2013-10-24 MED ORDER — HEPARIN SOD (PORK) LOCK FLUSH 100 UNIT/ML IV SOLN
INTRAVENOUS | Status: AC
  Filled 2013-10-24: qty 5

## 2013-10-24 MED ORDER — HEPARIN SOD (PORK) LOCK FLUSH 100 UNIT/ML IV SOLN
500.0000 [IU] | Freq: Once | INTRAVENOUS | Status: AC
  Administered 2013-10-24: 500 [IU] via INTRAVENOUS

## 2013-10-24 MED ORDER — SODIUM CHLORIDE 0.9 % IV SOLN
INTRAVENOUS | Status: DC
  Administered 2013-10-24: 11:00:00 via INTRAVENOUS
  Filled 2013-10-24 (×8): qty 1000

## 2013-10-24 MED ORDER — GADOBENATE DIMEGLUMINE 529 MG/ML IV SOLN
7.0000 mL | Freq: Once | INTRAVENOUS | Status: AC | PRN
  Administered 2013-10-24: 7 mL via INTRAVENOUS

## 2013-10-24 NOTE — Progress Notes (Signed)
Tolerated well

## 2013-10-24 NOTE — Progress Notes (Signed)
Edema to lower BLE and pedal edema bilat.

## 2013-10-24 NOTE — Patient Instructions (Signed)
[place] Discharge Instructions  RECOMMENDATIONS MADE BY THE CONSULTANT AND ANY TEST RESULTS WILL BE SENT TO YOUR REFERRING PHYSICIAN.  EXAM FINDINGS BY THE PHYSICIAN TODAY AND SIGNS OR SYMPTOMS TO REPORT TO CLINIC OR PRIMARY PHYSICIAN:   Blood work due again on 11/07/13 with your IV infusion and MD visit.    If you are not taking your Reglan (metoclopramide) and you have noticed that the food is not moving through as good as it once was - then start back taking the Reglan. You can take 1 tablet before meals and at bedtime (for a total of 4 tablets a day). See if this improves the food moving through.   Your Nexium is written for twice a day. Start taking the Nexium two times a day as it is written on the prescription.      Thank you for choosing [facility] to provide your oncology and hematology care.  To afford each patient quality time with our providers, please arrive at least 15 minutes before your scheduled appointment time.  With your help, our goal is to use those 15 minutes to complete the necessary work-up to ensure our physicians have the information they need to help with your evaluation and healthcare recommendations.    Effective January 1st, 2014, we ask that you re-schedule your appointment with our physicians should you arrive 10 or more minutes late for your appointment.  We strive to give you quality time with our providers, and arriving late affects you and other patients whose appointments are after yours.    Again, thank you for choosing [facility].  Our hope is that these requests will decrease the amount of time that you wait before being seen by our physicians.       _____________________________________________________________  Should you have questions after your visit to [facility], please contact our office at [phone] between the hours of 8:30 a.m. and 5:00 p.m.  Voicemails left after 4:30  p.m. will not be returned until the following business day.  For prescription refill requests, have your pharmacy contact our office with your prescription refill request.

## 2013-10-28 ENCOUNTER — Other Ambulatory Visit (HOSPITAL_COMMUNITY): Payer: Self-pay | Admitting: Hematology and Oncology

## 2013-10-28 ENCOUNTER — Encounter (HOSPITAL_BASED_OUTPATIENT_CLINIC_OR_DEPARTMENT_OTHER): Payer: Medicare Other

## 2013-10-28 VITALS — BP 149/73 | HR 89 | Temp 98.0°F | Resp 18 | Wt 149.6 lb

## 2013-10-28 DIAGNOSIS — C3430 Malignant neoplasm of lower lobe, unspecified bronchus or lung: Secondary | ICD-10-CM | POA: Diagnosis not present

## 2013-10-28 DIAGNOSIS — C3492 Malignant neoplasm of unspecified part of left bronchus or lung: Secondary | ICD-10-CM

## 2013-10-28 DIAGNOSIS — R638 Other symptoms and signs concerning food and fluid intake: Secondary | ICD-10-CM

## 2013-10-28 DIAGNOSIS — IMO0002 Reserved for concepts with insufficient information to code with codable children: Secondary | ICD-10-CM

## 2013-10-28 MED ORDER — SODIUM CHLORIDE 0.9 % IV SOLN: Freq: Once | INTRAVENOUS | Status: AC

## 2013-10-28 MED ORDER — SODIUM CHLORIDE 0.9 % IJ SOLN
10.0000 mL | Freq: Once | INTRAMUSCULAR | Status: AC
  Administered 2013-10-28: 10 mL via INTRAVENOUS

## 2013-10-28 MED ORDER — SODIUM CHLORIDE 0.9 % IV SOLN
INTRAVENOUS | Status: DC
  Administered 2013-10-28: 11:00:00 via INTRAVENOUS

## 2013-10-28 MED ORDER — HEPARIN SOD (PORK) LOCK FLUSH 100 UNIT/ML IV SOLN
INTRAVENOUS | Status: AC
  Filled 2013-10-28: qty 5

## 2013-10-28 MED ORDER — HEPARIN SOD (PORK) LOCK FLUSH 100 UNIT/ML IV SOLN
500.0000 [IU] | Freq: Once | INTRAVENOUS | Status: AC
  Administered 2013-10-28: 500 [IU] via INTRAVENOUS

## 2013-10-28 MED ORDER — HYDROCODONE-CHLORPHENIRAMINE 5-4 MG/5ML PO SOLN: 10.0000 mL | Freq: Every day | ORAL | Status: DC

## 2013-10-28 NOTE — Progress Notes (Signed)
Patient states she has been feeling some better. Reports drinking 3-4 glasses of water a day. Encouraged to try to add 2 more glasses. Denies nausea or vomiting.

## 2013-10-30 ENCOUNTER — Other Ambulatory Visit: Payer: Self-pay | Admitting: Family Medicine

## 2013-10-31 ENCOUNTER — Encounter (HOSPITAL_COMMUNITY): Payer: Self-pay

## 2013-10-31 ENCOUNTER — Encounter (HOSPITAL_BASED_OUTPATIENT_CLINIC_OR_DEPARTMENT_OTHER): Payer: Medicare Other

## 2013-10-31 VITALS — BP 160/72 | HR 88 | Temp 98.5°F | Resp 18 | Wt 152.0 lb

## 2013-10-31 DIAGNOSIS — IMO0002 Reserved for concepts with insufficient information to code with codable children: Secondary | ICD-10-CM

## 2013-10-31 DIAGNOSIS — R638 Other symptoms and signs concerning food and fluid intake: Secondary | ICD-10-CM

## 2013-10-31 DIAGNOSIS — C3430 Malignant neoplasm of lower lobe, unspecified bronchus or lung: Secondary | ICD-10-CM

## 2013-10-31 DIAGNOSIS — C3492 Malignant neoplasm of unspecified part of left bronchus or lung: Secondary | ICD-10-CM

## 2013-10-31 MED ORDER — HEPARIN SOD (PORK) LOCK FLUSH 100 UNIT/ML IV SOLN
500.0000 [IU] | Freq: Once | INTRAVENOUS | Status: AC
  Administered 2013-10-31: 500 [IU] via INTRAVENOUS
  Filled 2013-10-31: qty 5

## 2013-10-31 MED ORDER — SODIUM CHLORIDE 0.9 % IJ SOLN
10.0000 mL | Freq: Once | INTRAMUSCULAR | Status: AC
  Administered 2013-10-31: 10 mL via INTRAVENOUS

## 2013-10-31 MED ORDER — SODIUM CHLORIDE 0.9 % IV SOLN
INTRAVENOUS | Status: DC
  Administered 2013-10-31: 10:00:00 via INTRAVENOUS

## 2013-11-04 ENCOUNTER — Encounter (HOSPITAL_COMMUNITY): Payer: Self-pay

## 2013-11-04 ENCOUNTER — Encounter (HOSPITAL_BASED_OUTPATIENT_CLINIC_OR_DEPARTMENT_OTHER): Payer: Medicare Other

## 2013-11-04 DIAGNOSIS — C3490 Malignant neoplasm of unspecified part of unspecified bronchus or lung: Secondary | ICD-10-CM

## 2013-11-04 DIAGNOSIS — R638 Other symptoms and signs concerning food and fluid intake: Secondary | ICD-10-CM

## 2013-11-04 MED ORDER — SODIUM CHLORIDE 0.9 % IJ SOLN
10.0000 mL | Freq: Once | INTRAMUSCULAR | Status: AC
  Administered 2013-11-04: 10 mL via INTRAVENOUS

## 2013-11-04 MED ORDER — SODIUM CHLORIDE 0.9 % IV SOLN
Freq: Once | INTRAVENOUS | Status: AC
  Administered 2013-11-04: 10:00:00 via INTRAVENOUS

## 2013-11-04 MED ORDER — HEPARIN SOD (PORK) LOCK FLUSH 100 UNIT/ML IV SOLN
500.0000 [IU] | Freq: Once | INTRAVENOUS | Status: AC
  Administered 2013-11-04: 500 [IU] via INTRAVENOUS
  Filled 2013-11-04: qty 5

## 2013-11-04 NOTE — Progress Notes (Signed)
[name] tolerated hydration well and without incident; verbalizes understanding for follow-up.  No distress noted at time of discharge and patient was discharged home with her daughter.

## 2013-11-07 ENCOUNTER — Encounter (HOSPITAL_COMMUNITY): Payer: Medicare Other

## 2013-11-07 ENCOUNTER — Encounter (HOSPITAL_COMMUNITY): Payer: Self-pay

## 2013-11-07 ENCOUNTER — Encounter (HOSPITAL_BASED_OUTPATIENT_CLINIC_OR_DEPARTMENT_OTHER): Payer: Medicare Other

## 2013-11-07 VITALS — BP 162/77 | HR 91 | Temp 98.6°F | Resp 16 | Wt 148.4 lb

## 2013-11-07 DIAGNOSIS — C3492 Malignant neoplasm of unspecified part of left bronchus or lung: Secondary | ICD-10-CM

## 2013-11-07 DIAGNOSIS — R918 Other nonspecific abnormal finding of lung field: Secondary | ICD-10-CM

## 2013-11-07 DIAGNOSIS — K209 Esophagitis, unspecified without bleeding: Secondary | ICD-10-CM | POA: Diagnosis not present

## 2013-11-07 DIAGNOSIS — D1809 Hemangioma of other sites: Secondary | ICD-10-CM | POA: Diagnosis not present

## 2013-11-07 DIAGNOSIS — K8020 Calculus of gallbladder without cholecystitis without obstruction: Secondary | ICD-10-CM | POA: Insufficient documentation

## 2013-11-07 DIAGNOSIS — J449 Chronic obstructive pulmonary disease, unspecified: Secondary | ICD-10-CM

## 2013-11-07 DIAGNOSIS — C3430 Malignant neoplasm of lower lobe, unspecified bronchus or lung: Secondary | ICD-10-CM | POA: Diagnosis not present

## 2013-11-07 DIAGNOSIS — C3490 Malignant neoplasm of unspecified part of unspecified bronchus or lung: Secondary | ICD-10-CM

## 2013-11-07 LAB — COMPREHENSIVE METABOLIC PANEL
ALBUMIN: 3.9 g/dL (ref 3.5–5.2)
ALK PHOS: 44 U/L (ref 39–117)
ALT: 15 U/L (ref 0–35)
AST: 38 U/L — ABNORMAL HIGH (ref 0–37)
BUN: 12 mg/dL (ref 6–23)
CO2: 30 mEq/L (ref 19–32)
Calcium: 9.8 mg/dL (ref 8.4–10.5)
Chloride: 100 mEq/L (ref 96–112)
Creatinine, Ser: 1.53 mg/dL — ABNORMAL HIGH (ref 0.50–1.10)
GFR calc Af Amer: 37 mL/min — ABNORMAL LOW (ref >90)
GFR calc non Af Amer: 32 mL/min — ABNORMAL LOW (ref >90)
Glucose, Bld: 112 mg/dL — ABNORMAL HIGH (ref 70–99)
POTASSIUM: 3.2 meq/L — AB (ref 3.7–5.3)
Sodium: 142 mEq/L (ref 137–147)
TOTAL PROTEIN: 7 g/dL (ref 6.0–8.3)
Total Bilirubin: 0.7 mg/dL (ref 0.3–1.2)

## 2013-11-07 LAB — CBC WITH DIFFERENTIAL/PLATELET
Basophils Absolute: 0.1 10*3/uL (ref 0.0–0.1)
Basophils Relative: 1 % (ref 0–1)
Eosinophils Absolute: 0.2 10*3/uL (ref 0.0–0.7)
Eosinophils Relative: 5 % (ref 0–5)
HEMATOCRIT: 31.9 % — AB (ref 36.0–46.0)
Hemoglobin: 10.7 g/dL — ABNORMAL LOW (ref 12.0–15.0)
LYMPHS PCT: 16 % (ref 12–46)
Lymphs Abs: 0.8 10*3/uL (ref 0.7–4.0)
MCH: 33.4 pg (ref 26.0–34.0)
MCHC: 33.5 g/dL (ref 30.0–36.0)
MCV: 99.7 fL (ref 78.0–100.0)
Monocytes Absolute: 0.5 10*3/uL (ref 0.1–1.0)
Monocytes Relative: 10 % (ref 3–12)
NEUTROS ABS: 3.4 10*3/uL (ref 1.7–7.7)
Neutrophils Relative %: 68 % (ref 43–77)
PLATELETS: 261 10*3/uL (ref 150–400)
RBC: 3.2 MIL/uL — ABNORMAL LOW (ref 3.87–5.11)
RDW: 16.1 % — AB (ref 11.5–15.5)
WBC: 4.9 10*3/uL (ref 4.0–10.5)

## 2013-11-07 LAB — LACTATE DEHYDROGENASE: LDH: 183 U/L (ref 94–250)

## 2013-11-07 MED ORDER — METOCLOPRAMIDE HCL 5 MG PO TABS: ORAL_TABLET | ORAL | Status: DC

## 2013-11-07 MED ORDER — SODIUM CHLORIDE 0.9 % IJ SOLN
10.0000 mL | INTRAMUSCULAR | Status: DC | PRN
  Administered 2013-11-07: 10 mL via INTRAVENOUS

## 2013-11-07 MED ORDER — HEPARIN SOD (PORK) LOCK FLUSH 100 UNIT/ML IV SOLN
500.0000 [IU] | Freq: Once | INTRAVENOUS | Status: AC
  Administered 2013-11-07: 500 [IU] via INTRAVENOUS
  Filled 2013-11-07: qty 5

## 2013-11-07 NOTE — Progress Notes (Signed)
[facility]  OFFICE PROGRESS NOTE  [name], MD [place] [address]  DIAGNOSIS: Squamous cell carcinoma of left lung - Plan: CBC with Differential, Comprehensive metabolic panel, Lactate dehydrogenase, CBC with Differential, Comprehensive metabolic panel, Lactate dehydrogenase, CT Chest W Contrast, CT Abdomen Pelvis W Contrast  COPD, moderate  Cholelithiasis  Lung mass - Plan: metoCLOPramide (REGLAN) 5 MG tablet  Lung cancer - Plan: metoCLOPramide (REGLAN) 5 MG tablet  Chief Complaint  Patient presents with  . Lung Cancer    CURRENT THERAPY: Just completed weekly carboplatin/Taxol with external beam radiotherapy for stage III rectal carcinoma to lung.  INTERVAL HISTORY: [name] 79 y.o. 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  She is eating better with no nausea or vomiting. She denies any further dysphagia. She was getting IV fluids but because of increasing oral intake, normal being given today. She denies any further hemoptysis, abdominal pain, fever, night sweats, does have lower 70 swelling since discontinuing Lasix and having received IV fluids. She denies any dysuria, hematuria, melena, hematochezia, skin rash, headache, or seizures.  MEDICAL HISTORY: Past Medical History  Diagnosis Date  . Hypertension   . Hypothyroidism   . Hyperlipidemia   . Insomnia   . GERD (gastroesophageal reflux disease)     chronic gastritis  . Adrenal adenoma   . Chronic diarrhea   . Fatty liver   . Arthritis     knee  . QT prolongation     syncope  . Coronary artery disease   . Impaired fasting glucose   . Renal insufficiency     low protien diet  . Tobacco use     1/2 ppd, approx 25-50 pack years (as of 08/2012)  . Family history of heart disease   . IBS (irritable bowel syndrome)   . Peripheral arterial disease     sstatus post  infrarenal abdominal aortic tube graft placed by Dr. Victorino Dike February 2008  . Cancer     Lung  . Squamous cell carcinoma of left lung 07/29/2013    INTERIM HISTORY: has Unspecified hypothyroidism; Pruritic dermatitis; HTN (hypertension); Chronic renal insufficiency; Other and unspecified hyperlipidemia; Generalized anxiety disorder; Insomnia; Esophageal reflux; Coronary artery disease; Essential hypertension; Hyperlipidemia; Peripheral arterial disease; Anemia, iron deficiency; Hemoptysis; Squamous cell carcinoma of left lung; Dehydration; Odynophagia; AKI (acute kidney injury); COPD, moderate; Protein-calorie malnutrition, severe; Radiation esophagitis; and Cholelithiasis on her problem list.   Squamous cell carcinoma of left lung    06/30/2013  Imaging  CT of chest demonstrates 4 cm left lower lobe lesion with other concerning findings for malignancy    07/09/2013  Imaging  PET- 4.1 cm left lower oobe lesion with left hilar and bilateral mediastinal lymphadeopathy. Hepatic lesion not confidently identified. 1.8 cm nodule in left upper lobe, cannot exclude low grade tumor.    07/16/2013  Initial Diagnosis  Squamous cell carcinoma of left lung via CT-guided biopsy by IR    07/29/2013  Surgery  IR placement of port-a-cath in preparation for concomittant chemoradiation    08/08/2013 - 09/12/2013  Chemotherapy  Paclitaxel/Carboplatin weekly x 6    08/08/2013 - 09/19/2013  Radiation Therapy     10/12/2013  Imaging       ALLERGIES:  is allergic to aleve; dilaudid; dyazide; and zithromax.  MEDICATIONS: has a current medication list which includes the following prescription(s): allopurinol, alprazolam, amlodipine, aspirin ec, cholecalciferol, enalapril, esomeprazole, fenofibrate, ferrous sulfate,  fluconazole, hydralazine, hydrocodone-acetaminophen, hydrocodone-chlorpheniramine, hydroxyzine, levothyroxine, loperamide, loratadine, magnesium oxide, metoclopramide, ondansetron, paroxetine, pravastatin, prochlorperazine, sucralfate, temazepam, and tramadol, and the following Facility-Administered Medications: sodium chloride and sodium chloride.  SURGICAL HISTORY:  Past Surgical History  Procedure Laterality Date  . Abdominal hysterectomy    . Thyroidectomy, partial    . Colonoscopy  5/04    normal  . Abdominal aortic aneurysm repair  07/2006    D. J.D. Kellie Simmering  . Cataract extraction Bilateral 2010  . Cardiac catheterization  02/2010    mod CAD in L-dominant system with normal LV function  . Colon resection  2005    1/2 colon removed  . Transthoracic echocardiogram  02/2010    EF 55-60%; mild conc LVH, normal systolic function; mildly calcified AV annulus  . Colonoscopy with esophagogastroduodenoscopy (egd) N/A 03/19/2013    Procedure: COLONOSCOPY WITH ESOPHAGOGASTRODUODENOSCOPY (EGD);  Surgeon: Rogene Houston, MD;  Location: [facility];  Service: Endoscopy;  Laterality: N/A;  145  . Dg biopsy lung Left Jan 2015    FAMILY HISTORY: family history includes Diabetes in her mother; Heart attack in her mother; Hyperlipidemia in her sister; Hypertension in her mother; Kidney disease in her brother.  SOCIAL HISTORY:  reports that she has been smoking Cigarettes.  She has a 29.5 pack-year smoking history. She has never used smokeless tobacco. She reports that she does not drink alcohol or use illicit drugs.  REVIEW OF SYSTEMS:  Other than that discussed above is noncontributory.  PHYSICAL EXAMINATION: ECOG PERFORMANCE STATUS: 1 - Symptomatic but completely ambulatory  There were no vitals taken for this visit.  GENERAL:alert, no distress and comfortable SKIN: skin color, texture, turgor are normal, no rashes or significant lesions EYES: PERLA; Conjunctiva are pink and non-injected,  sclera clear SINUSES: No redness or tenderness over maxillary or ethmoid sinuses OROPHARYNX:no exudate, no erythema on lips, buccal mucosa, or tongue. NECK: supple, thyroid normal size, non-tender, without nodularity. No masses CHEST: Increased AP diameter with the lung fields bilaterally. LYMPH:  no palpable lymphadenopathy in the cervical, axillary or inguinal LUNGS: clear to auscultation and percussion with normal breathing effort HEART: regular rate & rhythm and no murmurs. ABDOMEN:abdomen soft, non-tender and normal bowel sounds MUSCULOSKELETAL:no cyanosis of digits and no clubbing. Range of motion normal.  NEURO: alert & oriented x 3 with fluent speech, no focal motor/sensory deficits   LABORATORY DATA: Infusion on 11/07/2013  Component Date Value Ref Range Status  . WBC 11/07/2013 4.9  4.0 - 10.5 K/uL Final  . RBC 11/07/2013 3.20* 3.87 - 5.11 MIL/uL Final  . Hemoglobin 11/07/2013 10.7* 12.0 - 15.0 g/dL Final  . HCT 11/07/2013 31.9* 36.0 - 46.0 % Final  . MCV 11/07/2013 99.7  78.0 - 100.0 fL Final  . MCH 11/07/2013 33.4  26.0 - 34.0 pg Final  . MCHC 11/07/2013 33.5  30.0 - 36.0 g/dL Final  . RDW 11/07/2013 16.1* 11.5 - 15.5 % Final  . Platelets 11/07/2013 261  150 - 400 K/uL Final  . Neutrophils Relative % 11/07/2013 68  43 - 77 % Final  . Neutro Abs 11/07/2013 3.4  1.7 - 7.7 K/uL Final  . Lymphocytes Relative 11/07/2013 16  12 - 46 % Final  . Lymphs Abs 11/07/2013 0.8  0.7 - 4.0  K/uL Final  . Monocytes Relative 11/07/2013 10  3 - 12 % Final  . Monocytes Absolute 11/07/2013 0.5  0.1 - 1.0 K/uL Final  . Eosinophils Relative 11/07/2013 5  0 - 5 % Final  . Eosinophils Absolute 11/07/2013 0.2  0.0 - 0.7 K/uL Final  . Basophils Relative 11/07/2013 1  0 - 1 % Final  . Basophils Absolute 11/07/2013 0.1  0.0 - 0.1 K/uL Final  . Sodium 11/07/2013 142  137 - 147 mEq/L Final  . Potassium 11/07/2013 3.2* 3.7 - 5.3 mEq/L Final  . Chloride 11/07/2013 100  96 - 112 mEq/L Final  . CO2  11/07/2013 30  19 - 32 mEq/L Final  . Glucose, Bld 11/07/2013 112* 70 - 99 mg/dL Final  . BUN 11/07/2013 12  6 - 23 mg/dL Final  . Creatinine, Ser 11/07/2013 1.53* 0.50 - 1.10 mg/dL Final  . Calcium 11/07/2013 9.8  8.4 - 10.5 mg/dL Final  . Total Protein 11/07/2013 7.0  6.0 - 8.3 g/dL Final  . Albumin 11/07/2013 3.9  3.5 - 5.2 g/dL Final  . AST 11/07/2013 38* 0 - 37 U/L Final  . ALT 11/07/2013 15  0 - 35 U/L Final  . Alkaline Phosphatase 11/07/2013 44  39 - 117 U/L Final  . Total Bilirubin 11/07/2013 0.7  0.3 - 1.2 mg/dL Final  . GFR calc non Af Amer 11/07/2013 32* >90 mL/min Final  . GFR calc Af Amer 11/07/2013 37* >90 mL/min Final   Comment: (NOTE)                          The eGFR has been calculated using the CKD EPI equation.                          This calculation has not been validated in all clinical situations.                          eGFR's persistently <90 mL/min signify possible Chronic Kidney                          Disease.  Marland Kitchen LDH 11/07/2013 183  94 - 250 U/L Final  Infusion on 10/15/2013  Component Date Value Ref Range Status  . WBC 10/15/2013 2.9* 4.0 - 10.5 K/uL Final  . RBC 10/15/2013 3.60* 3.87 - 5.11 MIL/uL Final  . Hemoglobin 10/15/2013 11.7* 12.0 - 15.0 g/dL Final  . HCT 10/15/2013 35.6* 36.0 - 46.0 % Final  . MCV 10/15/2013 98.9  78.0 - 100.0 fL Final  . MCH 10/15/2013 32.5  26.0 - 34.0 pg Final  . MCHC 10/15/2013 32.9  30.0 - 36.0 g/dL Final  . RDW 10/15/2013 18.0* 11.5 - 15.5 % Final  . Platelets 10/15/2013 241  150 - 400 K/uL Final  . Neutrophils Relative % 10/15/2013 66  43 - 77 % Final  . Neutro Abs 10/15/2013 1.9  1.7 - 7.7 K/uL Final  . Lymphocytes Relative 10/15/2013 19  12 - 46 % Final  . Lymphs Abs 10/15/2013 0.6* 0.7 - 4.0 K/uL Final  . Monocytes Relative 10/15/2013 13* 3 - 12 % Final  . Monocytes Absolute 10/15/2013 0.4  0.1 - 1.0 K/uL Final  . Eosinophils Relative 10/15/2013 1  0 - 5 % Final  . Eosinophils Absolute 10/15/2013 0.0  0.0 - 0.7  K/uL Final  .  Basophils Relative 10/15/2013 1  0 - 1 % Final  . Basophils Absolute 10/15/2013 0.0  0.0 - 0.1 K/uL Final  . Sodium 10/15/2013 139  137 - 147 mEq/L Final  . Potassium 10/15/2013 3.3* 3.7 - 5.3 mEq/L Final  . Chloride 10/15/2013 99  96 - 112 mEq/L Final  . CO2 10/15/2013 28  19 - 32 mEq/L Final  . Glucose, Bld 10/15/2013 90  70 - 99 mg/dL Final  . BUN 10/15/2013 11  6 - 23 mg/dL Final  . Creatinine, Ser 10/15/2013 1.58* 0.50 - 1.10 mg/dL Final  . Calcium 10/15/2013 10.0  8.4 - 10.5 mg/dL Final  . Total Protein 10/15/2013 6.7  6.0 - 8.3 g/dL Final  . Albumin 10/15/2013 3.6  3.5 - 5.2 g/dL Final  . AST 10/15/2013 37  0 - 37 U/L Final  . ALT 10/15/2013 15  0 - 35 U/L Final  . Alkaline Phosphatase 10/15/2013 42  39 - 117 U/L Final  . Total Bilirubin 10/15/2013 0.6  0.3 - 1.2 mg/dL Final  . GFR calc non Af Amer 10/15/2013 31* >90 mL/min Final  . GFR calc Af Amer 10/15/2013 36* >90 mL/min Final   Comment: (NOTE)                          The eGFR has been calculated using the CKD EPI equation.                          This calculation has not been validated in all clinical situations.                          eGFR's persistently <90 mL/min signify possible Chronic Kidney                          Disease.    PATHOLOGY: Squamous cell carcinoma.  Urinalysis    Component Value Date/Time   COLORURINE YELLOW 09/22/2013 1110   APPEARANCEUR CLEAR 09/22/2013 1110   LABSPEC 1.025 09/22/2013 1110   PHURINE 5.5 09/22/2013 1110   GLUCOSEU NEGATIVE 09/22/2013 1110   HGBUR NEGATIVE 09/22/2013 1110   BILIRUBINUR NEGATIVE 09/22/2013 1110   KETONESUR NEGATIVE 09/22/2013 1110   PROTEINUR TRACE* 09/22/2013 1110   UROBILINOGEN 0.2 09/22/2013 1110   NITRITE NEGATIVE 09/22/2013 1110   LEUKOCYTESUR NEGATIVE 09/22/2013 1110    RADIOGRAPHIC STUDIES: Ct Abdomen Pelvis Wo Contrast  10/14/2013   CLINICAL DATA:  Restaging non-small cell lung cancer  EXAM: CT CHEST, ABDOMEN AND PELVIS WITHOUT CONTRAST   TECHNIQUE: Multidetector CT imaging of the chest, abdomen and pelvis was performed following the standard protocol without IV contrast.  COMPARISON:  PET-CT dated 07/09/2013  FINDINGS: CT CHEST FINDINGS  2.2 x 3.2 cm posterior left lower lobe cavitary mass (series 3/ image 46), previously 3.7 x 4.1 cm, corresponding to known primary bronchogenic neoplasm.  Additional 1.8 x 0.8 cm ground-glass nodular opacity in the subpleural left upper lobe (series 3/ image 24), unchanged. Additional mild nodularity in the left upper lobe (series 3/images 16 and 24).  Moderate centrilobular and paraseptal emphysematous changes. No pleural effusion or pneumothorax.  The heart is normal in size. No pericardial effusion. Coronary atherosclerosis. Atherosclerotic calcifications of the aortic arch.  Small mediastinal lymph nodes, including an 8 mm short axis precarinal node (series 2/ image 27) and an 8 mm short axis subcarinal node (series  2/image 31). Prior bilateral hilar nodes are not well visualized on unenhanced CT. No suspicious axillary lymphadenopathy.  Right chest port terminating in the SVC.  Degenerative changes of the thoracic spine.  CT ABDOMEN AND PELVIS FINDINGS  Two hypodense hepatic lesions measuring 12 mm in the anterior segment right hepatic lobe (series 2/ image 52) and 10 mm in the medial segment left hepatic lobe (series 2/image 58), poorly visualized on unenhanced CT.  Spleen, pancreas, and right adrenal gland are within normal limits.  3.3 x 3.0 cm left adrenal adenoma (series 2/image 29).  Layering gallstone (series 2/ image 70), without associated inflammatory changes.  Kidneys are unremarkable.  No renal calculi or hydronephrosis.  Status post right hemicolectomy with ileocolic anastomosis in the left mid abdomen.  Infrarenal abdominal aortic graft. Atherosclerotic calcifications of the abdominal aorta and branch vessels.  No abdominopelvic ascites.  No suspicious abdominopelvic lymphadenopathy.  Status post  hysterectomy.  No adnexal masses.  Bladder is within normal limits.  Degenerative changes of the lumbar spine.  IMPRESSION: 3.2 cm posterior left lower lobe cavitary mass, corresponding to known primary bronchogenic neoplasm, improved. Additional pulmonary nodules are unchanged.  Small mediastinal nodes measuring up to 8 mm, hypermetabolic on prior PET. Prior hilar lymphadenopathy is not well visualized on unenhanced CT.  Two hypodense hepatic lesions measuring up to 12 mm, poorly evaluated. Consider MRI abdomen (without contrast given GFR) for further evaluation as clinically warranted.  Additional stable ancillary findings as above.   Electronically Signed   By: Julian Hy M.D.   On: 10/14/2013 12:21   Ct Chest Wo Contrast  10/14/2013   CLINICAL DATA:  Restaging non-small cell lung cancer  EXAM: CT CHEST, ABDOMEN AND PELVIS WITHOUT CONTRAST  TECHNIQUE: Multidetector CT imaging of the chest, abdomen and pelvis was performed following the standard protocol without IV contrast.  COMPARISON:  PET-CT dated 07/09/2013  FINDINGS: CT CHEST FINDINGS  2.2 x 3.2 cm posterior left lower lobe cavitary mass (series 3/ image 46), previously 3.7 x 4.1 cm, corresponding to known primary bronchogenic neoplasm.  Additional 1.8 x 0.8 cm ground-glass nodular opacity in the subpleural left upper lobe (series 3/ image 24), unchanged. Additional mild nodularity in the left upper lobe (series 3/images 16 and 24).  Moderate centrilobular and paraseptal emphysematous changes. No pleural effusion or pneumothorax.  The heart is normal in size. No pericardial effusion. Coronary atherosclerosis. Atherosclerotic calcifications of the aortic arch.  Small mediastinal lymph nodes, including an 8 mm short axis precarinal node (series 2/ image 27) and an 8 mm short axis subcarinal node (series 2/image 31). Prior bilateral hilar nodes are not well visualized on unenhanced CT. No suspicious axillary lymphadenopathy.  Right chest port  terminating in the SVC.  Degenerative changes of the thoracic spine.  CT ABDOMEN AND PELVIS FINDINGS  Two hypodense hepatic lesions measuring 12 mm in the anterior segment right hepatic lobe (series 2/ image 52) and 10 mm in the medial segment left hepatic lobe (series 2/image 58), poorly visualized on unenhanced CT.  Spleen, pancreas, and right adrenal gland are within normal limits.  3.3 x 3.0 cm left adrenal adenoma (series 2/image 29).  Layering gallstone (series 2/ image 70), without associated inflammatory changes.  Kidneys are unremarkable.  No renal calculi or hydronephrosis.  Status post right hemicolectomy with ileocolic anastomosis in the left mid abdomen.  Infrarenal abdominal aortic graft. Atherosclerotic calcifications of the abdominal aorta and branch vessels.  No abdominopelvic ascites.  No suspicious abdominopelvic lymphadenopathy.  Status post  hysterectomy.  No adnexal masses.  Bladder is within normal limits.  Degenerative changes of the lumbar spine.  IMPRESSION: 3.2 cm posterior left lower lobe cavitary mass, corresponding to known primary bronchogenic neoplasm, improved. Additional pulmonary nodules are unchanged.  Small mediastinal nodes measuring up to 8 mm, hypermetabolic on prior PET. Prior hilar lymphadenopathy is not well visualized on unenhanced CT.  Two hypodense hepatic lesions measuring up to 12 mm, poorly evaluated. Consider MRI abdomen (without contrast given GFR) for further evaluation as clinically warranted.  Additional stable ancillary findings as above.   Electronically Signed   By: Julian Hy M.D.   On: 10/14/2013 12:21   Mr Abdomen W Wo Contrast  10/24/2013   CLINICAL DATA:  Left lung squamous cell carcinoma. Indeterminate liver lesions seen on recent CT.  EXAM: MRI ABDOMEN WITHOUT AND WITH CONTRAST  TECHNIQUE: Multiplanar multisequence MR imaging of the abdomen was performed both before and after the administration of intravenous contrast.  CONTRAST:  2m MULTIHANCE  GADOBENATE DIMEGLUMINE 529 MG/ML IV SOLN  COMPARISON:  CT on 10/14/2013  FINDINGS: 3 cm mass again noted in the posterior left lower lobe as seen on recent CT.  Tiny sub-cm cysts are seen in the caudate and left hepatic lobe. In addition, there is a 1.1 cm lesion located in the left hepatic lobe on image 24 of series 3, and a 2.0 cm lesion in the superior right hepatic lobe on image 14 of series 3. Both of these lesion show near fluid T2 hyperintensity. Neither of these lesions shows a definitive peripheral nodular pattern of enhancement, but both showed delayed enhancement close to that of blood pool activity (images 19 in 33 of series 23). These findings favor small benign hemangiomas over metastatic lesions.  A 3 cm left adrenal mass remains stable consistent with a benign adenoma. Decreased diffuse bone marrow and splenic T2 signal also seen, consistent with transfusional siderhosis. The pancreas, spleen, and kidneys are normal in appearance. No evidence of hydronephrosis. No lymphadenopathy identified. No evidence of inflammatory process or abnormal fluid collections. Cholelithiasis again noted, without evidence of cholecystitis or biliary ductal dilatation.  IMPRESSION: Two liver lesions in the superior right hepatic lobe and left hepatic lobe remain indeterminate technically, but favor small benign hemangiomas over metastatic lesions. Recommend continued followup by MRI in 3 4 months.  Stable 3 cm benign left adrenal adenoma. Transfusional siderhosis also noted.  3 cm left lower lobe lung mass and cholelithiasis again noted.   Electronically Signed   By: JEarle GellM.D.   On: 10/24/2013 13:07    ASSESSMENT:  #1. Stage III-B squamous cell carcinoma lung, excellent response to combined modality therapy. #2. Esophagitis, improved. #3. Chronic obstructive pulmonary disease #4. Hepatic hemangiomas.   PLAN:  #1. Watchful expectation. #2. Office visit with CBC, chem profile, LDH, and CT scans of the  chest abdomen and pelvis in 3 months. #3. Resume furosemide and potassium chloride daily.   All questions were answered. The patient knows to call the clinic with any problems, questions or concerns. We can certainly see the patient much sooner if necessary.   I spent 25 minutes counseling the patient face to face. The total time spent in the appointment was 30 minutes.    GFarrel Gobble MD 11/07/2013 10:46 AM  DISCLAIMER:  This note was dictated with voice recognition software.  Similar sounding words can inadvertently be transcribed inaccurately and may not be corrected upon review.

## 2013-11-07 NOTE — Patient Instructions (Signed)
[employer] Discharge Instructions  RECOMMENDATIONS MADE BY THE CONSULTANT AND ANY TEST RESULTS WILL BE SENT TO YOUR REFERRING PHYSICIAN.  EXAM FINDINGS BY THE PHYSICIAN TODAY AND SIGNS OR SYMPTOMS TO REPORT TO CLINIC OR PRIMARY PHYSICIAN: Exam and findings as discussed by Dr.Formanek.  MEDICATIONS PRESCRIBED:  Resume Lasix AND Potassium that you have at home. Take it as it is prescribed. If you need refills call and let us know.  INSTRUCTIONS/FOLLOW-UP: Port flush every 6 weeks. Lab work again in 3 months. CT scans in 3 months, MD appointment to follow. Report any issues/concerns to clinic as needed prior to appointments.  Thank you for choosing [facility] to provide your oncology and hematology care.  To afford each patient quality time with our providers, please arrive at least 15 minutes before your scheduled appointment time.  With your help, our goal is to use those 15 minutes to complete the necessary work-up to ensure our physicians have the information they need to help with your evaluation and healthcare recommendations.    Effective January 1st, 2014, we ask that you re-schedule your appointment with our physicians should you arrive 10 or more minutes late for your appointment.  We strive to give you quality time with our providers, and arriving late affects you and other patients whose appointments are after yours.    Again, thank you for choosing [facility].  Our hope is that these requests will decrease the amount of time that you wait before being seen by our physicians.       _____________________________________________________________  Should you have questions after your visit to [facility], please contact our office at [phone] between the hours of 8:30 a.m. and 5:00 p.m.  Voicemails left after 4:30 p.m. will not be returned until the following business day.  For prescription refill requests, have your  pharmacy contact our office with your prescription refill request.

## 2013-11-07 NOTE — Progress Notes (Signed)
[name] presented for Portacath access and flush. Proper placement of portacath confirmed by CXR. Portacath located right chest wall accessed with  H 20 needle. Good blood return present. Portacath flushed with 69ml NS and 500U/65ml Heparin and needle removed intact. Procedure without incident. Patient tolerated procedure well.

## 2013-11-15 ENCOUNTER — Other Ambulatory Visit: Payer: Self-pay | Admitting: Family Medicine

## 2013-11-20 ENCOUNTER — Telehealth (HOSPITAL_COMMUNITY): Payer: Self-pay

## 2013-11-20 NOTE — Telephone Encounter (Signed)
Call from daughter, states "Mom is still having the coughing spells but mucus is clear. Is taking the Hydrocodone cough syrup and it helps some but makes her really sleepy.  She also complains of being tired all the time and does feel like doing much of anything.  Wanted to know if there was anything else we can do for the cough.  She finished her radiation at the end of March but just doesn't seem to be getting any better."

## 2013-11-21 ENCOUNTER — Other Ambulatory Visit (HOSPITAL_COMMUNITY): Payer: Self-pay | Admitting: Hematology and Oncology

## 2013-11-21 ENCOUNTER — Telehealth (HOSPITAL_COMMUNITY): Payer: Self-pay

## 2013-11-21 MED ORDER — BENZONATATE 100 MG PO CAPS: ORAL_CAPSULE | ORAL | Status: DC

## 2013-11-21 MED ORDER — PREDNISONE 20 MG PO TABS: ORAL_TABLET | ORAL | Status: DC

## 2013-11-21 NOTE — Telephone Encounter (Signed)
Mardene Celeste notified and verbalized understanding of instructions regarding stopping the hydrocodone, starting the prednisone and Benzonatate.  She will call us on Monday with update on Ms. Souter's condition.

## 2013-11-21 NOTE — Telephone Encounter (Signed)
Message copied by Mellissa Kohut on Fri Nov 21, 2013  8:32 AM ------      Message from: Farrel Gobble A      Created: Fri Nov 21, 2013  6:48 AM       Probable radiation pneumonitis.  Have ordered Prednisone 20mg  daily along with Benzonatate.  Stop Hydrocodone cough med. Tell her to call back on Monday re: symptoms.   ------

## 2013-11-24 ENCOUNTER — Other Ambulatory Visit (HOSPITAL_COMMUNITY): Payer: Self-pay | Admitting: Oncology

## 2013-11-24 ENCOUNTER — Telehealth (HOSPITAL_COMMUNITY): Payer: Self-pay

## 2013-11-24 DIAGNOSIS — C3492 Malignant neoplasm of unspecified part of left bronchus or lung: Secondary | ICD-10-CM

## 2013-11-24 NOTE — Telephone Encounter (Signed)
Call from daughter, requesting refill for potassium.  States "Dr. Barnet Glasgow restarted her lasix and told her to resume her potassium.  Is out of potassium and need a refill.  She has been taking 13meq daily.  She's also feeling weak and I'm concerned that she might be getting dehydrated again.  Would like for her to be seen by either the PA or Dr. Loreta Ave sooner than scheduled.  Appointment scheduled per request with PA on 6/2 @ 3:30pm.

## 2013-11-24 NOTE — Telephone Encounter (Signed)
Kdur is e-scribed.  Please perform labs 30 minutes prior to her appt.  Orders placed for CBC diff, CMET

## 2013-11-25 ENCOUNTER — Encounter (HOSPITAL_BASED_OUTPATIENT_CLINIC_OR_DEPARTMENT_OTHER): Payer: Medicare Other

## 2013-11-25 ENCOUNTER — Encounter (HOSPITAL_COMMUNITY): Payer: Medicare Other | Attending: Hematology and Oncology | Admitting: Oncology

## 2013-11-25 ENCOUNTER — Encounter (HOSPITAL_COMMUNITY): Payer: Self-pay | Admitting: Oncology

## 2013-11-25 VITALS — BP 130/75 | HR 89 | Temp 98.6°F | Resp 20

## 2013-11-25 DIAGNOSIS — C3430 Malignant neoplasm of lower lobe, unspecified bronchus or lung: Secondary | ICD-10-CM

## 2013-11-25 DIAGNOSIS — R5381 Other malaise: Secondary | ICD-10-CM | POA: Diagnosis not present

## 2013-11-25 DIAGNOSIS — C3490 Malignant neoplasm of unspecified part of unspecified bronchus or lung: Secondary | ICD-10-CM | POA: Insufficient documentation

## 2013-11-25 DIAGNOSIS — Z9889 Other specified postprocedural states: Secondary | ICD-10-CM | POA: Insufficient documentation

## 2013-11-25 DIAGNOSIS — R5383 Other fatigue: Secondary | ICD-10-CM | POA: Diagnosis not present

## 2013-11-25 DIAGNOSIS — C3492 Malignant neoplasm of unspecified part of left bronchus or lung: Secondary | ICD-10-CM

## 2013-11-25 DIAGNOSIS — IMO0002 Reserved for concepts with insufficient information to code with codable children: Secondary | ICD-10-CM

## 2013-11-25 LAB — COMPREHENSIVE METABOLIC PANEL
ALBUMIN: 4.3 g/dL (ref 3.5–5.2)
ALT: 15 U/L (ref 0–35)
AST: 29 U/L (ref 0–37)
Alkaline Phosphatase: 66 U/L (ref 39–117)
BILIRUBIN TOTAL: 0.4 mg/dL (ref 0.3–1.2)
BUN: 35 mg/dL — ABNORMAL HIGH (ref 6–23)
CHLORIDE: 99 meq/L (ref 96–112)
CO2: 22 mEq/L (ref 19–32)
CREATININE: 1.92 mg/dL — AB (ref 0.50–1.10)
Calcium: 10.1 mg/dL (ref 8.4–10.5)
GFR calc non Af Amer: 24 mL/min — ABNORMAL LOW (ref >90)
GFR, EST AFRICAN AMERICAN: 28 mL/min — AB (ref >90)
GLUCOSE: 142 mg/dL — AB (ref 70–99)
Potassium: 3.9 mEq/L (ref 3.7–5.3)
Sodium: 137 mEq/L (ref 137–147)
Total Protein: 7.5 g/dL (ref 6.0–8.3)

## 2013-11-25 LAB — CBC WITH DIFFERENTIAL/PLATELET
BASOS PCT: 0 % (ref 0–1)
Basophils Absolute: 0 10*3/uL (ref 0.0–0.1)
EOS ABS: 0 10*3/uL (ref 0.0–0.7)
Eosinophils Relative: 0 % (ref 0–5)
HEMATOCRIT: 32.7 % — AB (ref 36.0–46.0)
HEMOGLOBIN: 11 g/dL — AB (ref 12.0–15.0)
Lymphocytes Relative: 8 % — ABNORMAL LOW (ref 12–46)
Lymphs Abs: 0.4 10*3/uL — ABNORMAL LOW (ref 0.7–4.0)
MCH: 33.7 pg (ref 26.0–34.0)
MCHC: 33.6 g/dL (ref 30.0–36.0)
MCV: 100.3 fL — ABNORMAL HIGH (ref 78.0–100.0)
MONO ABS: 0.2 10*3/uL (ref 0.1–1.0)
MONOS PCT: 3 % (ref 3–12)
Neutro Abs: 4.7 10*3/uL (ref 1.7–7.7)
Neutrophils Relative %: 89 % — ABNORMAL HIGH (ref 43–77)
Platelets: 296 10*3/uL (ref 150–400)
RBC: 3.26 MIL/uL — ABNORMAL LOW (ref 3.87–5.11)
RDW: 14.7 % (ref 11.5–15.5)
WBC: 5.3 10*3/uL (ref 4.0–10.5)

## 2013-11-25 NOTE — Progress Notes (Signed)
Meda Klinefelter presented for labwork. Labs per MD order drawn via Peripheral Line 23 gauge needle inserted in left antecubital.  Good blood return present. Procedure without incident.  Needle removed intact. Patient tolerated procedure well.  Presents today for examination - per family pt has not been drinking enough fluids - pt reports she thinks she has.  Reports feeling tired and fatigued.

## 2013-11-25 NOTE — Progress Notes (Signed)
Labs drawn

## 2013-11-25 NOTE — Progress Notes (Signed)
The patient is seen as a work-in today at the daughter's request.  "We do not think she is drinking enough."  Orders were placed for STAT CBC diff, and metabolic panel.  CBC    Component Value Date/Time   WBC 5.3 11/25/2013 1530   RBC 3.26* 11/25/2013 1530   HGB 11.0* 11/25/2013 1530   HCT 32.7* 11/25/2013 1530   PLT 296 11/25/2013 1530   MCV 100.3* 11/25/2013 1530   MCH 33.7 11/25/2013 1530   MCHC 33.6 11/25/2013 1530   RDW 14.7 11/25/2013 1530   LYMPHSABS 0.4* 11/25/2013 1530   MONOABS 0.2 11/25/2013 1530   EOSABS 0.0 11/25/2013 1530   BASOSABS 0.0 11/25/2013 1530      Chemistry      Component Value Date/Time   NA 137 11/25/2013 1530   K 3.9 11/25/2013 1530   CL 99 11/25/2013 1530   CO2 22 11/25/2013 1530   BUN 35* 11/25/2013 1530   CREATININE 1.92* 11/25/2013 1530   CREATININE 2.03* 05/13/2013 0941      Component Value Date/Time   CALCIUM 10.1 11/25/2013 1530   ALKPHOS 66 11/25/2013 1530   AST 29 11/25/2013 1530   ALT 15 11/25/2013 1530   BILITOT 0.4 11/25/2013 1530     I personally reviewed and went over laboratory results with the patient.  The results are noted within this dictation.  BP 130/75  Pulse 89  Temp(Src) 98.6 F (37 C)  Resp 20  She reports that she continues to have clear sputum production (slobbering).  She notes that it is improved compared to 2 months ago.  She has been talking to her other friends who had cancer and they have seemed to recover quicker than her.  However, her friends that she has been talking to are 10-20 years younger than her and were treated in a different fashion because they had a different cancer.    She is educated about the recovery process from concomitant chemoradiation and this differs among patients.   She does not have any clear complaints other than tiredness, fatigue, and "slobbering."  She was started on 20 mg of Prednisone in AM daily last week.  She is encouraged to continue with this treatment.    She has a return appointment upcoming with repeat  staging scans.  She will maintain these appointments as scheduled.   Patient and plan discussed with Dr. Farrel Gobble and he is in agreement with the aforementioned.   35 minutes in face-to-face discussion and 45 minutes total on visit.  More than 50% of the time spent with the patient was utilized for counseling and coordination of care.   [facility] 11/25/2013

## 2013-11-25 NOTE — Patient Instructions (Addendum)
[place] Discharge Instructions  RECOMMENDATIONS MADE BY THE CONSULTANT AND ANY TEST RESULTS WILL BE SENT TO YOUR REFERRING PHYSICIAN.  Return for appointments as scheduled.    Thank you for choosing [facility] to provide your oncology and hematology care.  To afford each patient quality time with our providers, please arrive at least 15 minutes before your scheduled appointment time.  With your help, our goal is to use those 15 minutes to complete the necessary work-up to ensure our physicians have the information they need to help with your evaluation and healthcare recommendations.    Effective January 1st, 2014, we ask that you re-schedule your appointment with our physicians should you arrive 10 or more minutes late for your appointment.  We strive to give you quality time with our providers, and arriving late affects you and other patients whose appointments are after yours.    Again, thank you for choosing [facility]  Our hope is that these requests will decrease the amount of time that you wait before being seen by our physicians.       _____________________________________________________________  Should you have questions after your visit to [facility], please contact our office at [phone] between the hours of 8:30 a.m. and 5:00 p.m.  Voicemails left after 4:30 p.m. will not be returned until the following business day.  For prescription refill requests, have your pharmacy contact our office with your prescription refill request.

## 2013-12-08 ENCOUNTER — Telehealth: Payer: Self-pay | Admitting: Family Medicine

## 2013-12-08 ENCOUNTER — Telehealth (HOSPITAL_COMMUNITY): Payer: Self-pay | Admitting: Oncology

## 2013-12-08 NOTE — Telephone Encounter (Signed)
Per patient, [name], PA-C switched her from Ambien to Restoril 15mg  hs for sleep due to her insurance not covering Ambien.  Patient now reports that the 15mg  Restoril is not helping her sleep - please advise.

## 2013-12-08 NOTE — Telephone Encounter (Signed)
Patient says that the temazepam (RESTORIL) 15 MG capsule is not working anymore for her sleep, wants to know if we can change the med.    [facility]

## 2013-12-08 NOTE — Telephone Encounter (Signed)
Cancel this message. Patients sister called back and said this was supposed to be notified to oncologist.

## 2013-12-08 NOTE — Telephone Encounter (Addendum)
Meda Klinefelter contacted and advised as per Dr. Idamae Lusher note below.  Verbalized understanding and will return call in 1 week if no improvement in sleep.  Message copied by Claude Manges on Mon Dec 08, 2013  1:27 PM ------      Message from: [place], [place]: Mon Dec 08, 2013 11:59 AM       Increase dose of Restoril to 30 mg at bedtime. ------

## 2013-12-08 NOTE — Telephone Encounter (Signed)
ok 

## 2013-12-13 ENCOUNTER — Other Ambulatory Visit: Payer: Self-pay | Admitting: Family Medicine

## 2013-12-15 ENCOUNTER — Other Ambulatory Visit (HOSPITAL_COMMUNITY): Payer: Self-pay | Admitting: Hematology and Oncology

## 2013-12-15 ENCOUNTER — Telehealth (HOSPITAL_COMMUNITY): Payer: Self-pay | Admitting: Hematology and Oncology

## 2013-12-15 MED ORDER — POTASSIUM CHLORIDE ER 20 MEQ PO TBCR: 10.0000 meq | EXTENDED_RELEASE_TABLET | Freq: Every day | ORAL | Status: DC

## 2013-12-15 MED ORDER — TEMAZEPAM 30 MG PO CAPS: ORAL_CAPSULE | ORAL | Status: DC

## 2013-12-18 ENCOUNTER — Other Ambulatory Visit: Payer: Self-pay | Admitting: Family Medicine

## 2013-12-19 ENCOUNTER — Encounter (HOSPITAL_BASED_OUTPATIENT_CLINIC_OR_DEPARTMENT_OTHER): Payer: Medicare Other

## 2013-12-19 DIAGNOSIS — Z95828 Presence of other vascular implants and grafts: Secondary | ICD-10-CM

## 2013-12-19 DIAGNOSIS — Z452 Encounter for adjustment and management of vascular access device: Secondary | ICD-10-CM | POA: Diagnosis not present

## 2013-12-19 DIAGNOSIS — C3430 Malignant neoplasm of lower lobe, unspecified bronchus or lung: Secondary | ICD-10-CM

## 2013-12-19 MED ORDER — SODIUM CHLORIDE 0.9 % IJ SOLN
10.0000 mL | Freq: Once | INTRAMUSCULAR | Status: AC
  Administered 2013-12-19: 10 mL via INTRAVENOUS

## 2013-12-19 MED ORDER — HEPARIN SOD (PORK) LOCK FLUSH 100 UNIT/ML IV SOLN
500.0000 [IU] | Freq: Once | INTRAVENOUS | Status: AC
  Administered 2013-12-19: 500 [IU] via INTRAVENOUS
  Filled 2013-12-19: qty 5

## 2013-12-19 NOTE — Progress Notes (Signed)
[name] presented for Portacath access and flush. Proper placement of portacath confirmed by CXR. Portacath located right chest wall accessed with  H 20 needle. Good blood return present. Portacath flushed with 72ml NS and 500U/9ml Heparin and needle removed intact. Procedure without incident. Patient tolerated procedure well.  Patient asked about stopping reglan and prednisone. Per Kirby Crigler, PA, patient may stop both meds. Instructions given to patient to taper prednisone 1/2 tablet x 4 days, then 1/2 tablet every other day x 1 week then stop. If she doesn't like how she feels off of prednisone she may resume taking it and may take reglan as needed. Patient and her sisterboth understand and agree.

## 2014-01-14 ENCOUNTER — Other Ambulatory Visit: Payer: Self-pay | Admitting: Family Medicine

## 2014-01-16 ENCOUNTER — Encounter (HOSPITAL_COMMUNITY): Payer: Medicare Other | Attending: Hematology and Oncology | Admitting: Oncology

## 2014-01-16 VITALS — BP 146/63 | HR 85 | Temp 98.6°F | Resp 20 | Wt 151.0 lb

## 2014-01-16 DIAGNOSIS — D649 Anemia, unspecified: Secondary | ICD-10-CM | POA: Insufficient documentation

## 2014-01-16 DIAGNOSIS — R5383 Other fatigue: Secondary | ICD-10-CM

## 2014-01-16 DIAGNOSIS — D7589 Other specified diseases of blood and blood-forming organs: Secondary | ICD-10-CM | POA: Insufficient documentation

## 2014-01-16 DIAGNOSIS — F411 Generalized anxiety disorder: Secondary | ICD-10-CM | POA: Diagnosis not present

## 2014-01-16 DIAGNOSIS — R5381 Other malaise: Secondary | ICD-10-CM | POA: Insufficient documentation

## 2014-01-16 DIAGNOSIS — C3490 Malignant neoplasm of unspecified part of unspecified bronchus or lung: Secondary | ICD-10-CM | POA: Insufficient documentation

## 2014-01-16 DIAGNOSIS — C3492 Malignant neoplasm of unspecified part of left bronchus or lung: Secondary | ICD-10-CM

## 2014-01-16 DIAGNOSIS — C3430 Malignant neoplasm of lower lobe, unspecified bronchus or lung: Secondary | ICD-10-CM

## 2014-01-16 NOTE — Progress Notes (Signed)
STAR Program Physical Impairment and Functional Assessment Screening Tool  1. Are you having any pain, including headaches, joint pain, or muscle pain (upper body = OT; lower body = PT)?  Yes, but I hand this before my cancer diagnosis.  2. Do your hands and/or feet feel numb or tingle (PT)?  Yes, but I hand this before my cancer diagnosis.  3. Does any part of your body feel swollen or larger than usual (upper body = OT; lower body = PT)?  Yes, this started after my diagnosis and is still a problem.  4. Are you so tired that you cannot do the things you want or need to do (PT or OT)?  Yes, this started after my diagnosis and is still a problem.  5. Are you feeling weak or are you having trouble moving any part of your body (PT/OT)?  Yes, but I hand this before my cancer diagnosis.  6. Are you having trouble concentrating, thinking, or remembering things (OT/ST)?  Yes, this started after my diagnosis and is still a problem.  7. Are you having trouble moving around or feel like you might trip or fall (PT)?  Yes, this started after my diagnosis and is still a problem.  8. Are you having trouble swallowing (ST)?  No  9. Are you having trouble speaking (ST)?  No  10. Are you having trouble with going or getting to the bathroom (OT)?  Yes, this started after my diagnosis and is still a problem.  11. Are you having trouble with your sexual function (OT)?  No  12. Are you having trouble lifting things, even just your arms (OT/PT)?  Yes, this started after my diagnosis and is still a problem.  57. Are you having trouble taking care of yourself as in dressing or bathing (OT)?  Yes, this started after my diagnosis and is still a problem.  14. Are you having trouble with daily tasks like chores or shopping (OT)?  Yes, this started after my diagnosis and is still a problem.  15. Are you having trouble driving (OT)?  Yes, this started after my diagnosis and is still a  problem.  13. Are you having trouble returning to work or completing your tasks at work (OT)?  No  Other concerns:    Legend: OT = Occupational Therapy PT = Physical Therapy ST = Speech Therapy

## 2014-01-16 NOTE — Progress Notes (Signed)
[name] is seen as a work-in today.  She requested her appointment because she remains fatigued, tired, and anxious/nervous.  "I am shaking and nervous."  "Is there another pill I can take?"  She is on Xanax QID.  I encouraged her to follow-up with her primary care provider regarding this.  I asked her frankly: "What can you not do today that you could do last year?"  She reports that she could clean and vacuum and etc.  She reports that the last time she vacuumed, she only got 1/4 done of what she had hoped to complete.  She then took a break and never went back to finish her vacuuming.  I encouraged that she is going to have to push herself to be more active and complete her ADL and IADLs.    I gave her a dose of reality as well.  She was re-educated that she was only diagnosed 6 months ago and completed all therapy only 4 months ago.  She is still very early in her recovery process.  I reminded her that today is the first time I personally have seen her walking, her face is filling out some, her weight is up about 2 lbs, and she is no longer spitting into a napkin/cup due to radiation esophagitis.  These are all clinical signs that she is recovering from intensive therapy.  We have screened her for the [facility] program and will refer her for further rehabilitation.  I have added additional lab work to her August scheduled lab date with TSH, anemia panel, magnesium level, phosphorus level, and Vit D level.  This is in addition to CBC diff, CMET, and LDH.  She will have a CT of chest on 8/10 as scheduled and a follow-up appointment on 8/14.  More than 50% of the time spent with the patient was utilized for counseling and coordination of care.  Patient and plan discussed with Dr. Nelida Meuse and he is in agreement with the aforementioned.   Basil Buffin 01/16/2014

## 2014-01-16 NOTE — Patient Instructions (Addendum)
[place] Discharge Instructions  RECOMMENDATIONS MADE BY THE CONSULTANT AND ANY TEST RESULTS WILL BE SENT TO YOUR REFERRING PHYSICIAN.  EXAM FINDINGS BY THE PHYSICIAN TODAY AND SIGNS OR SYMPTOMS TO REPORT TO CLINIC OR PRIMARY PHYSICIAN: Exam and findings as discussed by Meriel Flavors.  MEDICATIONS PRESCRIBED:  Continue as prescribed.  INSTRUCTIONS/FOLLOW-UP: We will make a "STAR" referral for physical therapy and rehab. Expect a call from [facility] to schedule this appointment. Keep all appointments as scheduled.   Thank you for choosing [place] to provide your oncology and hematology care.  To afford each patient quality time with our providers, please arrive at least 15 minutes before your scheduled appointment time.  With your help, our goal is to use those 15 minutes to complete the necessary work-up to ensure our physicians have the information they need to help with your evaluation and healthcare recommendations.    Effective January 1st, 2014, we ask that you re-schedule your appointment with our physicians should you arrive 10 or more minutes late for your appointment.  We strive to give you quality time with our providers, and arriving late affects you and other patients whose appointments are after yours.    Again, thank you for choosing [facility].  Our hope is that these requests will decrease the amount of time that you wait before being seen by our physicians.       _____________________________________________________________  Should you have questions after your visit to [facility], please contact our office at [phone] between the hours of 8:30 a.m. and 4:30 p.m.  Voicemails left after 4:30 p.m. will not be returned until the following business day.  For prescription refill requests, have your pharmacy contact our office with your prescription refill request.     _______________________________________________________________  We hope that we have given you very good care.  You may receive a patient satisfaction survey in the mail, please complete it and return it as soon as possible.  We value your feedback!  _______________________________________________________________  Have you asked about our STAR program?  STAR stands for Survivorship Training and Rehabilitation, and this is a nationally recognized cancer care program that focuses on survivorship and rehabilitation.  Cancer and cancer treatments may cause problems, such as, pain, making you feel tired and keeping you from doing the things that you need or want to do. Cancer rehabilitation can help. Our goal is to reduce these troubling effects and help you have the best quality of life possible.  You may receive a survey from a nurse that asks questions about your current state of health.  Based on the survey results, all eligible patients will be referred to the [facility] program for an evaluation so we can better serve you!  A frequently asked questions sheet is available upon request.

## 2014-01-20 ENCOUNTER — Ambulatory Visit (INDEPENDENT_AMBULATORY_CARE_PROVIDER_SITE_OTHER): Payer: Medicare Other | Admitting: Family Medicine

## 2014-01-20 ENCOUNTER — Encounter: Payer: Self-pay | Admitting: Family Medicine

## 2014-01-20 VITALS — BP 122/68 | Wt 150.0 lb

## 2014-01-20 DIAGNOSIS — Z79899 Other long term (current) drug therapy: Secondary | ICD-10-CM

## 2014-01-20 DIAGNOSIS — I10 Essential (primary) hypertension: Secondary | ICD-10-CM | POA: Diagnosis not present

## 2014-01-20 DIAGNOSIS — E782 Mixed hyperlipidemia: Secondary | ICD-10-CM | POA: Diagnosis not present

## 2014-01-20 DIAGNOSIS — J449 Chronic obstructive pulmonary disease, unspecified: Secondary | ICD-10-CM | POA: Diagnosis not present

## 2014-01-20 DIAGNOSIS — E785 Hyperlipidemia, unspecified: Secondary | ICD-10-CM

## 2014-01-20 DIAGNOSIS — G4700 Insomnia, unspecified: Secondary | ICD-10-CM

## 2014-01-20 DIAGNOSIS — F411 Generalized anxiety disorder: Secondary | ICD-10-CM

## 2014-01-20 LAB — HEPATIC FUNCTION PANEL
ALBUMIN: 4.1 g/dL (ref 3.5–5.2)
ALK PHOS: 52 U/L (ref 39–117)
ALT: 13 U/L (ref 0–35)
AST: 22 U/L (ref 0–37)
BILIRUBIN DIRECT: 0.1 mg/dL (ref 0.0–0.3)
BILIRUBIN TOTAL: 0.4 mg/dL (ref 0.2–1.2)
Indirect Bilirubin: 0.3 mg/dL (ref 0.2–1.2)
Total Protein: 7 g/dL (ref 6.0–8.3)

## 2014-01-20 LAB — LIPID PANEL
Cholesterol: 242 mg/dL — ABNORMAL HIGH (ref 0–200)
HDL: 121 mg/dL (ref >39)
LDL CALC: 104 mg/dL — AB (ref 0–99)
Total CHOL/HDL Ratio: 2 Ratio
Triglycerides: 85 mg/dL (ref <150)
VLDL: 17 mg/dL (ref 0–40)

## 2014-01-20 MED ORDER — PAROXETINE HCL 40 MG PO TABS: 40.0000 mg | ORAL_TABLET | Freq: Every day | ORAL | Status: DC

## 2014-01-20 MED ORDER — TIOTROPIUM BROMIDE MONOHYDRATE 18 MCG IN CAPS: 18.0000 ug | ORAL_CAPSULE | Freq: Once | RESPIRATORY_TRACT | Status: DC

## 2014-01-20 MED ORDER — ALPRAZOLAM 1 MG PO TABS: ORAL_TABLET | ORAL | Status: DC

## 2014-01-20 NOTE — Progress Notes (Signed)
   Subjective:    Patient ID: [name], female    DOB: 03-11-1935, 79 y.o.   MRN: [id number]  Anxiety Presents for follow-up visit. Symptoms include depressed mood, excessive worry, nervous/anxious behavior and restlessness.   Compliance with prior treatments has been poor.   Takes xanax 1mg . 1/2 tablet QID and paxil. [name] slight anxious at times. She has gone through a lot. Treatment for metastatic lung cancer. Currently in remission.  Periods of feeling down. No suicidal thoughts. Long-standing history of anxiety and depression now unfortunately worsen.  Progressive shortness of breath. Known history of COPD. Long-standing history of smoking. No actual wheezing.  Not exercising at all partly due to he partly due to lack of motivation.  Anxious about her known lung cancer and potential difficulties down the road.   Review of Systems  Psychiatric/Behavioral: The patient is nervous/anxious.    no chest pain no headache slight chronic cough some exertional dyspnea no nausea no diaphoresis no abdominal pain ROS otherwise negative     Objective:   Physical Exam  Alert no acute distress. Anxious appearing HEENT normal. Vital stable. Lungs no true wheezes slightly diminished breath sounds heart regular in rhythm ankles without edema.      Assessment & Plan:  Impression #1 dyspnea likely multifactorial. #2 exercise intolerance discussed #3 COPD discussed certainly a factor. #4 worsening anxiety. #5 lung cancer discussed #6 depression worsening #6 hyperlipidemia. Compliant with medicines. Status uncertain. plan patient resistance to psychiatry referral. Increase Paxil to 40. Increase Xanax to 1 mg #90 one half to one 3 times a day. This is a 50% increase. 35-40 minutes spent most in discussion. Followup in 3 months. Exercise strongly encouraged. WSL

## 2014-01-22 ENCOUNTER — Encounter: Payer: Self-pay | Admitting: Family Medicine

## 2014-01-22 ENCOUNTER — Telehealth: Payer: Self-pay | Admitting: Family Medicine

## 2014-01-22 NOTE — Telephone Encounter (Signed)
Patient needs the Rx that was prescribed for her cramping. She is currently having cramping in her feet and hands.   [facility]

## 2014-01-25 NOTE — Telephone Encounter (Signed)
Ntsw, name of med?

## 2014-01-26 MED ORDER — CYCLOBENZAPRINE HCL 10 MG PO TABS: 10.0000 mg | ORAL_TABLET | Freq: Every day | ORAL | Status: DC | PRN

## 2014-01-26 NOTE — Telephone Encounter (Signed)
Autumn do med hx and look back and see if we gave muscle spasm agent, look in chart if not in epic

## 2014-01-26 NOTE — Telephone Encounter (Signed)
Rx sent electronically to pharmacy. Patient notified. 

## 2014-01-26 NOTE — Telephone Encounter (Signed)
Patient and family do not remember the name of the med but stated you gave it to her about a year ago for cramping in hands and feet

## 2014-01-26 NOTE — Telephone Encounter (Signed)
Ok flexeril 10 numb thirty one po qd prn spasms five ref

## 2014-01-26 NOTE — Telephone Encounter (Signed)
No muscle relaxer in Epic. Flexeril 10 mg listed 12/13 in chart

## 2014-01-28 ENCOUNTER — Other Ambulatory Visit: Payer: Self-pay | Admitting: Family Medicine

## 2014-01-30 ENCOUNTER — Encounter (HOSPITAL_COMMUNITY): Payer: Medicare Other | Attending: Hematology and Oncology

## 2014-01-30 DIAGNOSIS — C3492 Malignant neoplasm of unspecified part of left bronchus or lung: Secondary | ICD-10-CM

## 2014-01-30 DIAGNOSIS — R5383 Other fatigue: Secondary | ICD-10-CM

## 2014-01-30 DIAGNOSIS — R5381 Other malaise: Secondary | ICD-10-CM | POA: Diagnosis not present

## 2014-01-30 DIAGNOSIS — Z452 Encounter for adjustment and management of vascular access device: Secondary | ICD-10-CM | POA: Diagnosis not present

## 2014-01-30 DIAGNOSIS — D649 Anemia, unspecified: Secondary | ICD-10-CM | POA: Insufficient documentation

## 2014-01-30 DIAGNOSIS — D7589 Other specified diseases of blood and blood-forming organs: Secondary | ICD-10-CM | POA: Diagnosis not present

## 2014-01-30 DIAGNOSIS — C3490 Malignant neoplasm of unspecified part of unspecified bronchus or lung: Secondary | ICD-10-CM | POA: Insufficient documentation

## 2014-01-30 LAB — CBC WITH DIFFERENTIAL/PLATELET
Basophils Absolute: 0 10*3/uL (ref 0.0–0.1)
Basophils Relative: 0 % (ref 0–1)
Eosinophils Absolute: 0.1 10*3/uL (ref 0.0–0.7)
Eosinophils Relative: 2 % (ref 0–5)
HCT: 35.4 % — ABNORMAL LOW (ref 36.0–46.0)
HEMOGLOBIN: 11.7 g/dL — AB (ref 12.0–15.0)
Lymphocytes Relative: 6 % — ABNORMAL LOW (ref 12–46)
Lymphs Abs: 0.4 10*3/uL — ABNORMAL LOW (ref 0.7–4.0)
MCH: 32.4 pg (ref 26.0–34.0)
MCHC: 33.1 g/dL (ref 30.0–36.0)
MCV: 98.1 fL (ref 78.0–100.0)
MONOS PCT: 5 % (ref 3–12)
Monocytes Absolute: 0.3 10*3/uL (ref 0.1–1.0)
NEUTROS ABS: 5.2 10*3/uL (ref 1.7–7.7)
Neutrophils Relative %: 87 % — ABNORMAL HIGH (ref 43–77)
Platelets: 249 10*3/uL (ref 150–400)
RBC: 3.61 MIL/uL — ABNORMAL LOW (ref 3.87–5.11)
RDW: 13.9 % (ref 11.5–15.5)
WBC: 6 10*3/uL (ref 4.0–10.5)

## 2014-01-30 LAB — IRON AND TIBC
IRON: 89 ug/dL (ref 42–135)
SATURATION RATIOS: 21 % (ref 20–55)
TIBC: 422 ug/dL (ref 250–470)
UIBC: 333 ug/dL (ref 125–400)

## 2014-01-30 LAB — COMPREHENSIVE METABOLIC PANEL
ALT: 10 U/L (ref 0–35)
AST: 18 U/L (ref 0–37)
Albumin: 3.6 g/dL (ref 3.5–5.2)
Alkaline Phosphatase: 51 U/L (ref 39–117)
Anion gap: 13 (ref 5–15)
BUN: 24 mg/dL — ABNORMAL HIGH (ref 6–23)
CHLORIDE: 99 meq/L (ref 96–112)
CO2: 27 meq/L (ref 19–32)
CREATININE: 1.89 mg/dL — AB (ref 0.50–1.10)
Calcium: 9.6 mg/dL (ref 8.4–10.5)
GFR calc Af Amer: 28 mL/min — ABNORMAL LOW (ref >90)
GFR calc non Af Amer: 25 mL/min — ABNORMAL LOW (ref >90)
Glucose, Bld: 121 mg/dL — ABNORMAL HIGH (ref 70–99)
Potassium: 3.3 mEq/L — ABNORMAL LOW (ref 3.7–5.3)
Sodium: 139 mEq/L (ref 137–147)
Total Bilirubin: 0.4 mg/dL (ref 0.3–1.2)
Total Protein: 6.9 g/dL (ref 6.0–8.3)

## 2014-01-30 LAB — VITAMIN B12: VITAMIN B 12: 294 pg/mL (ref 211–911)

## 2014-01-30 LAB — TSH: TSH: 7.58 u[IU]/mL — ABNORMAL HIGH (ref 0.350–4.500)

## 2014-01-30 LAB — LACTATE DEHYDROGENASE: LDH: 155 U/L (ref 94–250)

## 2014-01-30 LAB — MAGNESIUM: MAGNESIUM: 1.8 mg/dL (ref 1.5–2.5)

## 2014-01-30 LAB — FOLATE

## 2014-01-30 LAB — PHOSPHORUS: PHOSPHORUS: 2.9 mg/dL (ref 2.3–4.6)

## 2014-01-30 LAB — FERRITIN: Ferritin: 255 ng/mL (ref 10–291)

## 2014-01-30 MED ORDER — SODIUM CHLORIDE 0.9 % IJ SOLN
10.0000 mL | INTRAMUSCULAR | Status: DC | PRN
  Administered 2014-01-30: 10 mL via INTRAVENOUS

## 2014-01-30 MED ORDER — HEPARIN SOD (PORK) LOCK FLUSH 100 UNIT/ML IV SOLN
500.0000 [IU] | Freq: Once | INTRAVENOUS | Status: AC
  Administered 2014-01-30: 500 [IU] via INTRAVENOUS
  Filled 2014-01-30: qty 5

## 2014-01-30 NOTE — Progress Notes (Signed)
Meda Klinefelter presented for Portacath access and flush.  Portacath located right chest wall accessed with  H 20 needle.  Good blood return present. Portacath flushed with 7ml NS and 500U/57ml Heparin and needle removed intact.  Procedure tolerated well and without incident.

## 2014-01-31 LAB — VITAMIN D 25 HYDROXY (VIT D DEFICIENCY, FRACTURES): VIT D 25 HYDROXY: 17 ng/mL — AB (ref 30–89)

## 2014-02-02 ENCOUNTER — Ambulatory Visit (HOSPITAL_COMMUNITY)
Admission: RE | Admit: 2014-02-02 | Discharge: 2014-02-02 | Disposition: A | Discharge status: Home / Self Care | Payer: Medicare Other | Source: Ambulatory Visit | Attending: Hematology and Oncology | Admitting: Hematology and Oncology

## 2014-02-02 ENCOUNTER — Other Ambulatory Visit (HOSPITAL_COMMUNITY): Payer: Self-pay | Admitting: Hematology and Oncology

## 2014-02-02 DIAGNOSIS — C3492 Malignant neoplasm of unspecified part of left bronchus or lung: Secondary | ICD-10-CM

## 2014-02-02 DIAGNOSIS — C3490 Malignant neoplasm of unspecified part of unspecified bronchus or lung: Secondary | ICD-10-CM | POA: Diagnosis not present

## 2014-02-02 DIAGNOSIS — D3500 Benign neoplasm of unspecified adrenal gland: Secondary | ICD-10-CM | POA: Diagnosis not present

## 2014-02-06 ENCOUNTER — Encounter (HOSPITAL_COMMUNITY): Payer: Self-pay

## 2014-02-06 ENCOUNTER — Telehealth (HOSPITAL_COMMUNITY): Payer: Self-pay | Admitting: *Deleted

## 2014-02-06 ENCOUNTER — Encounter (HOSPITAL_COMMUNITY): Payer: Self-pay | Admitting: *Deleted

## 2014-02-06 ENCOUNTER — Encounter (HOSPITAL_BASED_OUTPATIENT_CLINIC_OR_DEPARTMENT_OTHER): Payer: Medicare Other

## 2014-02-06 VITALS — BP 121/76 | HR 66 | Temp 98.7°F | Resp 18 | Wt 149.8 lb

## 2014-02-06 DIAGNOSIS — C3490 Malignant neoplasm of unspecified part of unspecified bronchus or lung: Secondary | ICD-10-CM

## 2014-02-06 DIAGNOSIS — D1803 Hemangioma of intra-abdominal structures: Secondary | ICD-10-CM | POA: Diagnosis not present

## 2014-02-06 DIAGNOSIS — J449 Chronic obstructive pulmonary disease, unspecified: Secondary | ICD-10-CM

## 2014-02-06 DIAGNOSIS — C3492 Malignant neoplasm of unspecified part of left bronchus or lung: Secondary | ICD-10-CM

## 2014-02-06 NOTE — Telephone Encounter (Signed)
error 

## 2014-02-06 NOTE — Patient Instructions (Signed)
..  [name] Discharge Instructions  RECOMMENDATIONS MADE BY THE CONSULTANT AND ANY TEST RESULTS WILL BE SENT TO YOUR REFERRING PHYSICIAN.  EXAM FINDINGS BY THE PHYSICIAN TODAY AND SIGNS OR SYMPTOMS TO REPORT TO CLINIC OR PRIMARY PHYSICIAN: Exam and findings as discussed by Dr. Barnet Glasgow.  CT scans are improved   INSTRUCTIONS/FOLLOW-UP: 6 weeks for port flush 3 months port flush and labs and Dr. Visit  Thank you for choosing [place] to provide your oncology and hematology care.  To afford each patient quality time with our providers, please arrive at least 15 minutes before your scheduled appointment time.  With your help, our goal is to use those 15 minutes to complete the necessary work-up to ensure our physicians have the information they need to help with your evaluation and healthcare recommendations.    Effective January 1st, 2014, we ask that you re-schedule your appointment with our physicians should you arrive 10 or more minutes late for your appointment.  We strive to give you quality time with our providers, and arriving late affects you and other patients whose appointments are after yours.    Again, thank you for choosing [facility].  Our hope is that these requests will decrease the amount of time that you wait before being seen by our physicians.       _____________________________________________________________  Should you have questions after your visit to [facility], please contact our office at [phone] between the hours of 8:30 a.m. and 4:30 p.m.  Voicemails left after 4:30 p.m. will not be returned until the following business day.  For prescription refill requests, have your pharmacy contact our office with your prescription refill request.    _______________________________________________________________  We hope that we have given you very good care.  You may receive a patient satisfaction  survey in the mail, please complete it and return it as soon as possible.  We value your feedback!  _______________________________________________________________  Have you asked about our STAR program?  STAR stands for Survivorship Training and Rehabilitation, and this is a nationally recognized cancer care program that focuses on survivorship and rehabilitation.  Cancer and cancer treatments may cause problems, such as, pain, making you feel tired and keeping you from doing the things that you need or want to do. Cancer rehabilitation can help. Our goal is to reduce these troubling effects and help you have the best quality of life possible.  You may receive a survey from a nurse that asks questions about your current state of health.  Based on the survey results, all eligible patients will be referred to the [facility] program for an evaluation so we can better serve you!  A frequently asked questions sheet is available upon request.

## 2014-02-06 NOTE — Progress Notes (Signed)
[facility]  OFFICE PROGRESS NOTE  [name], MD [address] [place] [address]  DIAGNOSIS: Squamous cell carcinoma of left lung - Plan: CBC with Differential, Comprehensive metabolic panel, Lactate dehydrogenase, CANCELED: CBC with Differential, CANCELED: Comprehensive metabolic panel, CANCELED: Lactate dehydrogenase  COPD, moderate  Hemangioma of liver  Chief Complaint  Patient presents with  . Locally advanced squamous cell carcinoma of the lung  . Follow-up    CURRENT THERAPY: Carboplatin/Taxol weekly with daily radiotherapy for stage III squamous cell carcinoma of the left lower lobe )4.1 cm (involving left hilar and bilateral mediastinal lymph nodes with questionable hepatic lesions clarified by MRI on 10/24/2013 as probable hemangiomas. with treatment starting on 08/07/2013 and finishing on 09/12/2013. Paxil dose was increased to 40 mg daily on 01/12/2014 by Marisa Cyphers, LPN.  INTERVAL HISTORY: [name] 79 y.o. female returns for followup of locally advanced squamous cell carcinoma of the left lower lobe, status post combined modality therapy utilizing carboplatin/Taxol/radiotherapy with treatment ending on 09/12/2013.  She has been more depressed of late and was started on higher dose Paxil. She feels somewhat better since. She is fearful that her cancer has progressed. She's had some "coldness" of the right upper extremity without swelling or discoloration of the fingertips. She denies any dysphagia, nausea, vomiting, diarrhea, constipation, lower extremity swelling or redness, chest pain, skin rash, headache, or seizures.  MEDICAL HISTORY: Past Medical History  Diagnosis Date  . Hypertension   . Hypothyroidism   . Hyperlipidemia   . Insomnia   . GERD (gastroesophageal reflux disease)     chronic gastritis  . Adrenal adenoma   . Chronic diarrhea   . Fatty liver   . Arthritis     knee  . QT prolongation    syncope  . Coronary artery disease   . Impaired fasting glucose   . Renal insufficiency     low protien diet  . Tobacco use     1/2 ppd, approx 25-50 pack years (as of 08/2012)  . Family history of heart disease   . IBS (irritable bowel syndrome)   . Peripheral arterial disease     sstatus post  infrarenal abdominal aortic tube graft placed by Dr. Victorino Dike February 2008  . Cancer     Lung  . Squamous cell carcinoma of left lung 07/29/2013    INTERIM HISTORY: has Unspecified hypothyroidism; Pruritic dermatitis; Chronic renal insufficiency; Generalized anxiety disorder; Insomnia; Esophageal reflux; Coronary artery disease; Essential hypertension; Hyperlipidemia; Peripheral arterial disease; Anemia, iron deficiency; Squamous cell carcinoma of left lung; COPD, moderate; and Cholelithiasis on her problem list.    ALLERGIES:  is allergic to aleve; dilaudid; dyazide; and zithromax.  MEDICATIONS: has a current medication list which includes the following prescription(s): allopurinol, alprazolam, amlodipine, aspirin ec, cyclobenzaprine, enalapril, esomeprazole, fenofibrate, ferrous sulfate, furosemide, hydralazine, levothyroxine, loperamide, magnesium oxide, ondansetron, paroxetine, potassium chloride er, pravastatin, prednisone, temazepam, tiotropium, benzonatate, cholecalciferol, hydroxyzine, metoclopramide, prochlorperazine, and tramadol.  SURGICAL HISTORY:  Past Surgical History  Procedure Laterality Date  . Abdominal hysterectomy    . Thyroidectomy, partial    . Colonoscopy  5/04    normal  . Abdominal aortic aneurysm repair  07/2006    D. J.D. Kellie Simmering  . Cataract extraction Bilateral 2010  . Cardiac catheterization  02/2010    mod CAD in L-dominant system with normal LV function  . Colon resection  2005    1/2 colon removed  . Transthoracic  echocardiogram  02/2010    EF 55-60%; mild conc LVH, normal systolic function; mildly calcified AV annulus  . Colonoscopy with  esophagogastroduodenoscopy (egd) N/A 03/19/2013    Procedure: COLONOSCOPY WITH ESOPHAGOGASTRODUODENOSCOPY (EGD);  Surgeon: Rogene Houston, MD;  Location: [facility];  Service: Endoscopy;  Laterality: N/A;  145  . Dg biopsy lung Left Jan 2015    FAMILY HISTORY: family history includes Diabetes in her mother; Heart attack in her mother; Hyperlipidemia in her sister; Hypertension in her mother; Kidney disease in her brother.  SOCIAL HISTORY:  reports that she has quit smoking. Her smoking use included Cigarettes. She has a 29.5 pack-year smoking history. She has never used smokeless tobacco. She reports that she does not drink alcohol or use illicit drugs.  REVIEW OF SYSTEMS:  Other than that discussed above is noncontributory.  PHYSICAL EXAMINATION: ECOG PERFORMANCE STATUS: 1 - Symptomatic but completely ambulatory  Blood pressure 121/76, pulse 66, temperature 98.7 F (37.1 C), temperature source Oral, resp. rate 18, weight 149 lb 12.8 oz (67.949 kg).  GENERAL:alert, no distress and comfortable SKIN: skin color, texture, turgor are normal, no rashes or significant lesions EYES: PERLA; Conjunctiva are pink and non-injected, sclera clear SINUSES: No redness or tenderness over maxillary or ethmoid sinuses OROPHARYNX:no exudate, no erythema on lips, buccal mucosa, or tongue. NECK: supple, thyroid normal size, non-tender, without nodularity. No masses CHEST: LifePort in place with increased AP diameter. LYMPH:  no palpable lymphadenopathy in the cervical, axillary or inguinal LUNGS: clear to auscultation and percussion with normal breathing effort HEART: regular rate & rhythm and no murmurs. ABDOMEN:abdomen soft, non-tender and normal bowel sounds MUSCULOSKELETAL:no cyanosis of digits and no clubbing. Range of motion normal. No evidence of swelling or tenderness. Pulses normal. NEURO: alert & oriented x 3 with fluent speech, no focal motor/sensory deficits   LABORATORY DATA: Infusion on  01/30/2014  Component Date Value Ref Range Status  . WBC 01/30/2014 6.0  4.0 - 10.5 K/uL Final  . RBC 01/30/2014 3.61* 3.87 - 5.11 MIL/uL Final  . Hemoglobin 01/30/2014 11.7* 12.0 - 15.0 g/dL Final  . HCT 01/30/2014 35.4* 36.0 - 46.0 % Final  . MCV 01/30/2014 98.1  78.0 - 100.0 fL Final  . MCH 01/30/2014 32.4  26.0 - 34.0 pg Final  . MCHC 01/30/2014 33.1  30.0 - 36.0 g/dL Final  . RDW 01/30/2014 13.9  11.5 - 15.5 % Final  . Platelets 01/30/2014 249  150 - 400 K/uL Final  . Neutrophils Relative % 01/30/2014 87* 43 - 77 % Final  . Neutro Abs 01/30/2014 5.2  1.7 - 7.7 K/uL Final  . Lymphocytes Relative 01/30/2014 6* 12 - 46 % Final  . Lymphs Abs 01/30/2014 0.4* 0.7 - 4.0 K/uL Final  . Monocytes Relative 01/30/2014 5  3 - 12 % Final  . Monocytes Absolute 01/30/2014 0.3  0.1 - 1.0 K/uL Final  . Eosinophils Relative 01/30/2014 2  0 - 5 % Final  . Eosinophils Absolute 01/30/2014 0.1  0.0 - 0.7 K/uL Final  . Basophils Relative 01/30/2014 0  0 - 1 % Final  . Basophils Absolute 01/30/2014 0.0  0.0 - 0.1 K/uL Final  . Sodium 01/30/2014 139  137 - 147 mEq/L Final  . Potassium 01/30/2014 3.3* 3.7 - 5.3 mEq/L Final  . Chloride 01/30/2014 99  96 - 112 mEq/L Final  . CO2 01/30/2014 27  19 - 32 mEq/L Final  . Glucose, Bld 01/30/2014 121* 70 - 99 mg/dL Final  . BUN 01/30/2014  24* 6 - 23 mg/dL Final  . Creatinine, Ser 01/30/2014 1.89* 0.50 - 1.10 mg/dL Final  . Calcium 01/30/2014 9.6  8.4 - 10.5 mg/dL Final  . Total Protein 01/30/2014 6.9  6.0 - 8.3 g/dL Final  . Albumin 01/30/2014 3.6  3.5 - 5.2 g/dL Final  . AST 01/30/2014 18  0 - 37 U/L Final  . ALT 01/30/2014 10  0 - 35 U/L Final  . Alkaline Phosphatase 01/30/2014 51  39 - 117 U/L Final  . Total Bilirubin 01/30/2014 0.4  0.3 - 1.2 mg/dL Final  . GFR calc non Af Amer 01/30/2014 25* >90 mL/min Final  . GFR calc Af Amer 01/30/2014 28* >90 mL/min Final   Comment: (NOTE)                          The eGFR has been calculated using the CKD EPI  equation.                          This calculation has not been validated in all clinical situations.                          eGFR's persistently <90 mL/min signify possible Chronic Kidney                          Disease.  . Anion gap 01/30/2014 13  5 - 15 Final  . LDH 01/30/2014 155  94 - 250 U/L Final  . Vitamin B-12 01/30/2014 294  211 - 911 pg/mL Final   Performed at [facility]  . Folate 01/30/2014 >20.0   Final   Comment: (NOTE)                          Reference Ranges                                 Deficient:       0.4 - 3.3 ng/mL                                 Indeterminate:   3.4 - 5.4 ng/mL                                 Normal:              > 5.4 ng/mL                          Performed at [facility]  . Iron 01/30/2014 89  42 - 135 ug/dL Final  . TIBC 01/30/2014 422  250 - 470 ug/dL Final  . Saturation Ratios 01/30/2014 21  20 - 55 % Final  . UIBC 01/30/2014 333  125 - 400 ug/dL Final   Performed at [facility]  . Ferritin 01/30/2014 255  10 - 291 ng/mL Final   Performed at [facility]  . TSH 01/30/2014 7.580* 0.350 - 4.500 uIU/mL Final   Performed at [facility]  . Magnesium 01/30/2014 1.8  1.5 - 2.5 mg/dL Final  . Phosphorus 01/30/2014 2.9  2.3 -  4.6 mg/dL Final  . Vit D, 25-Hydroxy 01/30/2014 17* 30 - 89 ng/mL Final   Comment: (NOTE)                          This assay accurately quantifies Vitamin D, which is the sum of the                          25-Hydroxy forms of Vitamin D2 and D3.  Studies have shown that the                          optimum concentration of 25-Hydroxy Vitamin D is 30 ng/mL or higher.                           Concentrations of Vitamin D between 20 and 29 ng/mL are considered to                          be insufficient and concentrations less than 20 ng/mL are considered                          to be deficient for Vitamin D.                          Performed at [facility]  Visit on 01/20/2014  Component Date Value Ref Range Status  . Cholesterol 01/20/2014 242* 0 - 200 mg/dL Final   Comment: ATP III Classification:                                < 200        mg/dL        Desirable                               200 - 239     mg/dL        Borderline High                               >= 240        mg/dL        High                             . Triglycerides 01/20/2014 85  <150 mg/dL Final  . HDL 01/20/2014 121  >39 mg/dL Final  . Total CHOL/HDL Ratio 01/20/2014 2.0   Final  . VLDL 01/20/2014 17  0 - 40 mg/dL Final  . LDL Cholesterol 01/20/2014 104* 0 - 99 mg/dL Final   Comment:                            Total Cholesterol/HDL Ratio:CHD Risk                                                 Coronary Heart Disease Risk  Table                                                                 Men       Women                                   1/2 Average Risk              3.4        3.3                                       Average Risk              5.0        4.4                                    2X Average Risk              9.6        7.1                                    3X Average Risk             23.4       11.0                          Use the calculated Patient Ratio above and the CHD Risk table                           to determine the patient's CHD Risk.                          ATP III Classification (LDL):                                < 100        mg/dL         Optimal                               100 - 129     mg/dL         Near or Above Optimal                               130 - 159     mg/dL         Borderline High                               160 - 189     mg/dL  High                                > 190        mg/dL         Very High                             . Total Bilirubin 01/20/2014 0.4  0.2 - 1.2 mg/dL Final  . Bilirubin, Direct 01/20/2014 0.1  0.0 - 0.3 mg/dL Final  . Indirect Bilirubin 01/20/2014 0.3  0.2 - 1.2 mg/dL Final    . Alkaline Phosphatase 01/20/2014 52  39 - 117 U/L Final  . AST 01/20/2014 22  0 - 37 U/L Final  . ALT 01/20/2014 13  0 - 35 U/L Final  . Total Protein 01/20/2014 7.0  6.0 - 8.3 g/dL Final  . Albumin 01/20/2014 4.1  3.5 - 5.2 g/dL Final    PATHOLOGY: Squamous cell carcinoma  Urinalysis    Component Value Date/Time   COLORURINE YELLOW 09/22/2013 1110   APPEARANCEUR CLEAR 09/22/2013 1110   LABSPEC 1.025 09/22/2013 1110   PHURINE 5.5 09/22/2013 1110   GLUCOSEU NEGATIVE 09/22/2013 1110   HGBUR NEGATIVE 09/22/2013 1110   BILIRUBINUR NEGATIVE 09/22/2013 1110   KETONESUR NEGATIVE 09/22/2013 1110   PROTEINUR TRACE* 09/22/2013 1110   UROBILINOGEN 0.2 09/22/2013 1110   NITRITE NEGATIVE 09/22/2013 1110   LEUKOCYTESUR NEGATIVE 09/22/2013 1110    RADIOGRAPHIC STUDIES: Ct Abdomen Pelvis Wo Contrast  02/02/2014   CLINICAL DATA:  Restaging left lung cancer.  EXAM: CT CHEST, ABDOMEN AND PELVIS WITHOUT CONTRAST  TECHNIQUE: Multidetector CT imaging of the chest, abdomen and pelvis was performed following the standard protocol without IV contrast.  COMPARISON:  10/14/2013  FINDINGS: CT CHEST FINDINGS  The previously seen cavitary mass in the left lower lobe has decreased in size, measuring 2.2 x 1.7 cm compared with 3.2 x 2.0 cm previously. This is measured on image 43 of series 8. Increasing paramediastinal and left lower lobe interstitial densities in both lungs, presumably related to radiation. 4 mm nodule in the right lower lobe posteriorly on image 53, not definitively seen on prior study. This is nonspecific. Recommend attention on followup imaging. No pleural effusions.  Moderate centrilobular and paraseptal emphysema. Heart is upper limits normal in size. Scattered coronary artery calcifications most pronounced in the left anterior descending and circumflex arteries. Scattered aortic calcifications without aneurysm. No mediastinal, hilar, or axillary adenopathy.  Right Port-A-Cath remains in place, unchanged.  Chest wall soft tissues are unremarkable.  CT ABDOMEN AND PELVIS FINDINGS  Two hepatic hypodensities have enlarged since prior study. The left hepatic lesion measures 15 mm on image 57 compared with 10 mm previously. The right hepatic hypodensity measures 19 mm on image 53 compared with 12 mm previously. No visible new hepatic lesions on this unenhanced study. Spleen, right adrenal, kidneys, pancreas have an unremarkable unenhanced appearance. Left adrenal lesion measures 3.2 x 2.7 cm compared with 3.3 x 3.0 cm previously. The density is approximately 0 Hounsfield units, most compatible with adenoma.  Changes of partial colectomy again noted, stable. Small bowel and stomach are grossly unremarkable. Small scattered retroperitoneal lymph nodes again noted. Left periaortic lymph node on image 74 measures 7 mm in short axis diameter compared with 10 mm previously. Aortic graft again noted. No ascites, free fluid or free air.  No acute bony abnormality or  focal bone lesion.  IMPRESSION: Decreasing size of the left lower lobe mass since prior study. Increasing paramediastinal and left lower lobe interstitial opacities, presumably related to radiation.  4 mm right lower lobe subpleural nodule, not seen on prior study. Recommend attention on followup imaging.  Moderate emphysema.  Slight enlargement of hypodensities within the liver concerning for metastases.  Stable left adrenal adenoma.   Electronically Signed   By: Rolm Baptise M.D.   On: 02/02/2014 09:52   Ct Chest Wo Contrast  02/02/2014   CLINICAL DATA:  Restaging left lung cancer.  EXAM: CT CHEST, ABDOMEN AND PELVIS WITHOUT CONTRAST  TECHNIQUE: Multidetector CT imaging of the chest, abdomen and pelvis was performed following the standard protocol without IV contrast.  COMPARISON:  10/14/2013  FINDINGS: CT CHEST FINDINGS  The previously seen cavitary mass in the left lower lobe has decreased in size, measuring 2.2 x 1.7 cm compared with 3.2 x 2.0 cm previously.  This is measured on image 43 of series 8. Increasing paramediastinal and left lower lobe interstitial densities in both lungs, presumably related to radiation. 4 mm nodule in the right lower lobe posteriorly on image 53, not definitively seen on prior study. This is nonspecific. Recommend attention on followup imaging. No pleural effusions.  Moderate centrilobular and paraseptal emphysema. Heart is upper limits normal in size. Scattered coronary artery calcifications most pronounced in the left anterior descending and circumflex arteries. Scattered aortic calcifications without aneurysm. No mediastinal, hilar, or axillary adenopathy.  Right Port-A-Cath remains in place, unchanged. Chest wall soft tissues are unremarkable.  CT ABDOMEN AND PELVIS FINDINGS  Two hepatic hypodensities have enlarged since prior study. The left hepatic lesion measures 15 mm on image 57 compared with 10 mm previously. The right hepatic hypodensity measures 19 mm on image 53 compared with 12 mm previously. No visible new hepatic lesions on this unenhanced study. Spleen, right adrenal, kidneys, pancreas have an unremarkable unenhanced appearance. Left adrenal lesion measures 3.2 x 2.7 cm compared with 3.3 x 3.0 cm previously. The density is approximately 0 Hounsfield units, most compatible with adenoma.  Changes of partial colectomy again noted, stable. Small bowel and stomach are grossly unremarkable. Small scattered retroperitoneal lymph nodes again noted. Left periaortic lymph node on image 74 measures 7 mm in short axis diameter compared with 10 mm previously. Aortic graft again noted. No ascites, free fluid or free air.  No acute bony abnormality or focal bone lesion.  IMPRESSION: Decreasing size of the left lower lobe mass since prior study. Increasing paramediastinal and left lower lobe interstitial opacities, presumably related to radiation.  4 mm right lower lobe subpleural nodule, not seen on prior study. Recommend attention on  followup imaging.  Moderate emphysema.  Slight enlargement of hypodensities within the liver concerning for metastases.  Stable left adrenal adenoma.   Electronically Signed   By: Rolm Baptise M.D.   On: 02/02/2014 09:52    ASSESSMENT:  #1. Stage III-B squamous cell carcinoma lung, excellent response to combined modality therapy, further improve on most recent CT scan performed on 02/02/2014. #2. Esophagitis, improved.  #3. Chronic obstructive pulmonary disease  #4. Hepatic hemangiomas. #5. Depression. #6. Anemia of chronic disease.    PLAN:  #1. Patient was encouraged to increase activity. #2. Port flush 6 weeks. #3. Followup in 3 months with CBC, chem profile, and LDH.   All questions were answered. The patient knows to call the clinic with any problems, questions or concerns. We can certainly see the patient much  sooner if necessary.   I spent 30 minutes counseling the patient face to face. The total time spent in the appointment was 40 minutes.    Doroteo Bradford, MD 02/06/2014 11:58 AM  DISCLAIMER:  This note was dictated with voice recognition software.  Similar sounding words can inadvertently be transcribed inaccurately and may not be corrected upon review.

## 2014-02-08 ENCOUNTER — Encounter (HOSPITAL_COMMUNITY): Payer: Self-pay | Admitting: Emergency Medicine

## 2014-02-08 ENCOUNTER — Emergency Department (HOSPITAL_COMMUNITY): Payer: Medicare Other

## 2014-02-08 ENCOUNTER — Emergency Department (HOSPITAL_COMMUNITY)
Admission: EM | Admit: 2014-02-08 | Discharge: 2014-02-09 | Disposition: A | Discharge status: Home / Self Care | Payer: Medicare Other | Attending: Emergency Medicine | Admitting: Emergency Medicine

## 2014-02-08 DIAGNOSIS — Z87891 Personal history of nicotine dependence: Secondary | ICD-10-CM | POA: Insufficient documentation

## 2014-02-08 DIAGNOSIS — J438 Other emphysema: Secondary | ICD-10-CM | POA: Diagnosis not present

## 2014-02-08 DIAGNOSIS — Z79899 Other long term (current) drug therapy: Secondary | ICD-10-CM | POA: Diagnosis not present

## 2014-02-08 DIAGNOSIS — K219 Gastro-esophageal reflux disease without esophagitis: Secondary | ICD-10-CM | POA: Diagnosis not present

## 2014-02-08 DIAGNOSIS — R0602 Shortness of breath: Secondary | ICD-10-CM | POA: Insufficient documentation

## 2014-02-08 DIAGNOSIS — I10 Essential (primary) hypertension: Secondary | ICD-10-CM | POA: Diagnosis not present

## 2014-02-08 DIAGNOSIS — E039 Hypothyroidism, unspecified: Secondary | ICD-10-CM | POA: Diagnosis not present

## 2014-02-08 DIAGNOSIS — Z9889 Other specified postprocedural states: Secondary | ICD-10-CM | POA: Insufficient documentation

## 2014-02-08 DIAGNOSIS — N390 Urinary tract infection, site not specified: Secondary | ICD-10-CM | POA: Insufficient documentation

## 2014-02-08 DIAGNOSIS — Z85118 Personal history of other malignant neoplasm of bronchus and lung: Secondary | ICD-10-CM | POA: Insufficient documentation

## 2014-02-08 DIAGNOSIS — R509 Fever, unspecified: Secondary | ICD-10-CM | POA: Diagnosis not present

## 2014-02-08 DIAGNOSIS — Z7982 Long term (current) use of aspirin: Secondary | ICD-10-CM | POA: Diagnosis not present

## 2014-02-08 DIAGNOSIS — I2510 Atherosclerotic heart disease of native coronary artery without angina pectoris: Secondary | ICD-10-CM | POA: Diagnosis not present

## 2014-02-08 DIAGNOSIS — E785 Hyperlipidemia, unspecified: Secondary | ICD-10-CM | POA: Diagnosis not present

## 2014-02-08 DIAGNOSIS — J8410 Pulmonary fibrosis, unspecified: Secondary | ICD-10-CM | POA: Diagnosis not present

## 2014-02-08 NOTE — ED Notes (Signed)
Family member states pt also c/o feeling hot but feels clammy, low grade fever noted in triage

## 2014-02-08 NOTE — ED Notes (Signed)
Pt c/o SOB since Friday, pt hx of lung CA and Chemo last in March, denies cough, pt appears in no distress in triage

## 2014-02-09 ENCOUNTER — Telehealth (HOSPITAL_COMMUNITY): Payer: Self-pay | Admitting: *Deleted

## 2014-02-09 LAB — COMPREHENSIVE METABOLIC PANEL
ALBUMIN: 3.6 g/dL (ref 3.5–5.2)
ALK PHOS: 47 U/L (ref 39–117)
ALT: 9 U/L (ref 0–35)
AST: 18 U/L (ref 0–37)
Anion gap: 14 (ref 5–15)
BILIRUBIN TOTAL: 0.5 mg/dL (ref 0.3–1.2)
BUN: 22 mg/dL (ref 6–23)
CHLORIDE: 98 meq/L (ref 96–112)
CO2: 25 mEq/L (ref 19–32)
Calcium: 9.4 mg/dL (ref 8.4–10.5)
Creatinine, Ser: 1.98 mg/dL — ABNORMAL HIGH (ref 0.50–1.10)
GFR calc non Af Amer: 23 mL/min — ABNORMAL LOW (ref >90)
GFR, EST AFRICAN AMERICAN: 27 mL/min — AB (ref >90)
GLUCOSE: 86 mg/dL (ref 70–99)
POTASSIUM: 3.4 meq/L — AB (ref 3.7–5.3)
Sodium: 137 mEq/L (ref 137–147)
Total Protein: 6.5 g/dL (ref 6.0–8.3)

## 2014-02-09 LAB — CBC WITH DIFFERENTIAL/PLATELET
Basophils Absolute: 0 10*3/uL (ref 0.0–0.1)
Basophils Relative: 0 % (ref 0–1)
EOS ABS: 0.1 10*3/uL (ref 0.0–0.7)
Eosinophils Relative: 2 % (ref 0–5)
HCT: 34.5 % — ABNORMAL LOW (ref 36.0–46.0)
HEMOGLOBIN: 11.7 g/dL — AB (ref 12.0–15.0)
Lymphocytes Relative: 11 % — ABNORMAL LOW (ref 12–46)
Lymphs Abs: 0.6 10*3/uL — ABNORMAL LOW (ref 0.7–4.0)
MCH: 32.8 pg (ref 26.0–34.0)
MCHC: 33.9 g/dL (ref 30.0–36.0)
MCV: 96.6 fL (ref 78.0–100.0)
MONOS PCT: 8 % (ref 3–12)
Monocytes Absolute: 0.5 10*3/uL (ref 0.1–1.0)
NEUTROS ABS: 4.7 10*3/uL (ref 1.7–7.7)
Neutrophils Relative %: 79 % — ABNORMAL HIGH (ref 43–77)
Platelets: 233 10*3/uL (ref 150–400)
RBC: 3.57 MIL/uL — ABNORMAL LOW (ref 3.87–5.11)
RDW: 13.8 % (ref 11.5–15.5)
WBC: 6 10*3/uL (ref 4.0–10.5)

## 2014-02-09 LAB — URINALYSIS, ROUTINE W REFLEX MICROSCOPIC
BILIRUBIN URINE: NEGATIVE
Glucose, UA: NEGATIVE mg/dL
Hgb urine dipstick: NEGATIVE
KETONES UR: NEGATIVE mg/dL
Nitrite: NEGATIVE
Specific Gravity, Urine: 1.01 (ref 1.005–1.030)
UROBILINOGEN UA: 0.2 mg/dL (ref 0.0–1.0)
pH: 6.5 (ref 5.0–8.0)

## 2014-02-09 LAB — URINE MICROSCOPIC-ADD ON

## 2014-02-09 MED ORDER — LEVOFLOXACIN 500 MG PO TABS
500.0000 mg | ORAL_TABLET | Freq: Once | ORAL | Status: AC
  Administered 2014-02-09: 500 mg via ORAL
  Filled 2014-02-09: qty 1

## 2014-02-09 MED ORDER — LEVOFLOXACIN 500 MG PO TABS: 500.0000 mg | ORAL_TABLET | Freq: Every day | ORAL | Status: DC

## 2014-02-09 NOTE — Discharge Instructions (Signed)
Follow up with your md this week.  Take tylenol for fever or aches

## 2014-02-09 NOTE — ED Provider Notes (Signed)
CSN: [id number]     Arrival date & time 02/08/14  2157 History   First MD Initiated Contact with Patient 02/08/14 2306     Chief Complaint  Patient presents with  . Shortness of Breath     (Consider location/radiation/quality/duration/timing/severity/associated sxs/prior Treatment) Patient is a 79 y.o. female presenting with fever. The history is provided by the patient (pt complains of fever and chills).  Fever Temp source:  Subjective Severity:  Moderate Onset quality:  Sudden Timing:  Intermittent Chronicity:  New Relieved by:  Nothing Worsened by:  Nothing tried Associated symptoms: no chest pain, no congestion, no cough, no diarrhea, no headaches and no rash     Past Medical History  Diagnosis Date  . Hypertension   . Hypothyroidism   . Hyperlipidemia   . Insomnia   . GERD (gastroesophageal reflux disease)     chronic gastritis  . Adrenal adenoma   . Chronic diarrhea   . Fatty liver   . Arthritis     knee  . QT prolongation     syncope  . Coronary artery disease   . Impaired fasting glucose   . Renal insufficiency     low protien diet  . Tobacco use     1/2 ppd, approx 25-50 pack years (as of 08/2012)  . Family history of heart disease   . IBS (irritable bowel syndrome)   . Peripheral arterial disease     sstatus post  infrarenal abdominal aortic tube graft placed by Dr. Victorino Dike February 2008  . Cancer     Lung  . Squamous cell carcinoma of left lung 07/29/2013   Past Surgical History  Procedure Laterality Date  . Abdominal hysterectomy    . Thyroidectomy, partial    . Colonoscopy  5/04    normal  . Abdominal aortic aneurysm repair  07/2006    D. J.D. Kellie Simmering  . Cataract extraction Bilateral 2010  . Cardiac catheterization  02/2010    mod CAD in L-dominant system with normal LV function  . Colon resection  2005    1/2 colon removed  . Transthoracic echocardiogram  02/2010    EF 55-60%; mild conc LVH, normal systolic function; mildly calcified AV  annulus  . Colonoscopy with esophagogastroduodenoscopy (egd) N/A 03/19/2013    Procedure: COLONOSCOPY WITH ESOPHAGOGASTRODUODENOSCOPY (EGD);  Surgeon: Rogene Houston, MD;  Location: [facility];  Service: Endoscopy;  Laterality: N/A;  145  . Dg biopsy lung Left Jan 2015   Family History  Problem Relation Age of Onset  . Hypertension Mother   . Diabetes Mother   . Heart attack Mother   . Hyperlipidemia Sister     x3 sister  . Kidney disease Brother    History  Substance Use Topics  . Smoking status: Former Smoker -- 0.50 packs/day for 59 years    Types: Cigarettes  . Smokeless tobacco: Never Used     Comment: smoking since age [age]  . Alcohol Use: No   OB History   Grav Para Term Preterm Abortions TAB SAB Ect Mult Living                 Review of Systems  Constitutional: Positive for fever. Negative for appetite change and fatigue.  HENT: Negative for congestion, ear discharge and sinus pressure.   Eyes: Negative for discharge.  Respiratory: Negative for cough.   Cardiovascular: Negative for chest pain.  Gastrointestinal: Negative for abdominal pain and diarrhea.  Genitourinary: Negative for frequency and hematuria.  Musculoskeletal: Negative for back pain.  Skin: Negative for rash.  Neurological: Negative for seizures and headaches.  Psychiatric/Behavioral: Negative for hallucinations.      Allergies  Aleve; Dilaudid; Dyazide; and Zithromax  Home Medications   Prior to Admission medications   Medication Sig Start Date End Date Taking? Authorizing Provider  allopurinol (ZYLOPRIM) 300 MG tablet Take 300 mg by mouth daily.    Historical Provider, MD  ALPRAZolam Duanne Moron) 1 MG tablet TAKE ONE HALF TABLET TO ONE TABLET TID PRN ANXIETY 01/20/14   Mikey Kirschner, MD  amLODipine (NORVASC) 10 MG tablet take 1 tablet at bedtime STOP VERAPAMIL. 01/28/14   Mikey Kirschner, MD  aspirin EC 81 MG tablet Take 81 mg by mouth daily.    Historical Provider, MD  benzonatate  (TESSALON) 100 MG capsule Take one perle up to 4 times daily to control cough.  May take 2 perles at bedtime. 11/21/13   Farrel Gobble, MD  Cholecalciferol 1000 UNITS capsule Take 1,000 Units by mouth daily.    Historical Provider, MD  cyclobenzaprine (FLEXERIL) 10 MG tablet Take 1 tablet (10 mg total) by mouth daily as needed for muscle spasms. 01/26/14 01/26/15  Mikey Kirschner, MD  enalapril (VASOTEC) 20 MG tablet TAKE ONE TABLET BY MOUTH ONCE DAILY 01/14/14   Mikey Kirschner, MD  esomeprazole (NEXIUM) 40 MG capsule TAKE ONE CAPSULE BY MOUTH ONCE DAILY 12/18/13   Kathyrn Drown, MD  fenofibrate 160 MG tablet take 1 tablet by mouth once daily with food    Mikey Kirschner, MD  ferrous sulfate (FERROUSUL) 325 (65 FE) MG tablet Take 1 tablet (325 mg total) by mouth 2 (two) times daily after a meal. 03/19/13   Rogene Houston, MD  furosemide (LASIX) 20 MG tablet take 1 tablet by mouth once daily    Mikey Kirschner, MD  hydrALAZINE (APRESOLINE) 25 MG tablet take 1 tablet by mouth three times a day    Mikey Kirschner, MD  hydrOXYzine (ATARAX/VISTARIL) 25 MG tablet Take 1 tablet (25 mg total) by mouth 3 (three) times daily as needed for itching. 12/02/12   Mikey Kirschner, MD  levofloxacin (LEVAQUIN) 500 MG tablet Take 1 tablet (500 mg total) by mouth daily. 02/09/14   Maudry Diego, MD  levothyroxine (SYNTHROID, LEVOTHROID) 137 MCG tablet take 1 tablet by mouth once daily 10/30/13   Mikey Kirschner, MD  loperamide (IMODIUM) 2 MG capsule Take 2 mg by mouth 4 (four) times daily as needed for diarrhea or loose stools.    Historical Provider, MD  magnesium oxide (MAG-OX) 400 MG tablet Take 750 mg by mouth daily.     Historical Provider, MD  metoCLOPramide (REGLAN) 5 MG tablet Take 1 or 2 tablets 4 times a day before meals and at bedtime to control nausea. 11/07/13   Farrel Gobble, MD  ondansetron (ZOFRAN) 8 MG tablet TAKE ONE TABLET BY MOUTH EVERY 8 HOURS AS NEEDED FOR NAUSEA    Mikey Kirschner, MD   PARoxetine (PAXIL) 40 MG tablet Take 1 tablet (40 mg total) by mouth daily. 01/20/14   Mikey Kirschner, MD  potassium chloride 20 MEQ TBCR Take 10 mEq by mouth daily. 12/15/13   Farrel Gobble, MD  pravastatin (PRAVACHOL) 20 MG tablet take 1 tablet by mouth once daily 10/30/13   Mikey Kirschner, MD  predniSONE (DELTASONE) 20 MG tablet Take one tablet daily in the AM to limit coughing. 11/21/13   Farrel Gobble, MD  prochlorperazine (COMPAZINE) 10 MG tablet Starting the day after chemo, take 1 tablet four times a day x 48 hours. Then may take 1 tablet four times a day IF needed for nausea/vomiting. 07/29/13   Farrel Gobble, MD  temazepam (RESTORIL) 30 MG capsule Take one capsule at bedtime as the for sleep. 12/15/13   Farrel Gobble, MD  tiotropium (SPIRIVA HANDIHALER) 18 MCG inhalation capsule Place 1 capsule (18 mcg total) into inhaler and inhale once. 01/20/14   Mikey Kirschner, MD  traMADol (ULTRAM) 50 MG tablet Take by mouth every 6 (six) hours as needed.    Historical Provider, MD   BP 133/66  Pulse 76  Temp(Src) 99.3 F (37.4 C) (Oral)  Resp 16  Ht 5\' 7"  (1.702 m)  Wt 150 lb (68.04 kg)  BMI 23.49 kg/m2  SpO2 99% Physical Exam  Constitutional: She is oriented to person, place, and time. She appears well-developed.  HENT:  Head: Normocephalic.  Eyes: Conjunctivae and EOM are normal. No scleral icterus.  Neck: Neck supple. No thyromegaly present.  Cardiovascular: Normal rate and regular rhythm.  Exam reveals no gallop and no friction rub.   No murmur heard. Pulmonary/Chest: No stridor. She has no wheezes. She has no rales. She exhibits no tenderness.  Abdominal: She exhibits no distension. There is no tenderness. There is no rebound.  Musculoskeletal: Normal range of motion. She exhibits no edema.  Lymphadenopathy:    She has no cervical adenopathy.  Neurological: She is oriented to person, place, and time. She exhibits normal muscle tone. Coordination normal.  Skin: No rash  noted. No erythema.  Psychiatric: She has a normal mood and affect. Her behavior is normal.    ED Course  Procedures (including critical care time) Labs Review Labs Reviewed  CBC WITH DIFFERENTIAL - Abnormal; Notable for the following:    RBC 3.57 (*)    Hemoglobin 11.7 (*)    HCT 34.5 (*)    Neutrophils Relative % 79 (*)    Lymphocytes Relative 11 (*)    Lymphs Abs 0.6 (*)    All other components within normal limits  COMPREHENSIVE METABOLIC PANEL - Abnormal; Notable for the following:    Potassium 3.4 (*)    Creatinine, Ser 1.98 (*)    GFR calc non Af Amer 23 (*)    GFR calc Af Amer 27 (*)    All other components within normal limits  URINALYSIS, ROUTINE W REFLEX MICROSCOPIC - Abnormal; Notable for the following:    Protein, ur TRACE (*)    Leukocytes, UA SMALL (*)    All other components within normal limits  URINE MICROSCOPIC-ADD ON    Imaging Review Dg Chest Portable 1 View  02/08/2014   CLINICAL DATA:  Fever and weakness.  History of lung cancer.  EXAM: PORTABLE CHEST - 1 VIEW  COMPARISON:  09/22/2013  FINDINGS: The cardiac silhouette, mediastinal and hilar contours are stable. The power port is stable. There are paramediastinal radiation changes bilaterally and underlying emphysema. No definite acute overlying pulmonary process.  IMPRESSION: Paramediastinal radiation fibrosis.  Emphysema.   Electronically Signed   By: Kalman Jewels M.D.   On: 02/08/2014 23:35     EKG Interpretation None      MDM   Final diagnoses:  UTI (lower urinary tract infection)   levaquin for uti      Maudry Diego, MD 02/09/14 [phone]

## 2014-02-09 NOTE — Telephone Encounter (Unsigned)
Pt did not want to start star program on 02-10-14 family was told to call and reschedule

## 2014-02-10 ENCOUNTER — Ambulatory Visit (HOSPITAL_COMMUNITY): Payer: Medicare Other | Admitting: Physical Therapy

## 2014-02-12 ENCOUNTER — Other Ambulatory Visit: Payer: Self-pay | Admitting: *Deleted

## 2014-02-12 ENCOUNTER — Telehealth: Payer: Self-pay | Admitting: Family Medicine

## 2014-02-12 MED ORDER — TIZANIDINE HCL 4 MG PO CAPS: 4.0000 mg | ORAL_CAPSULE | Freq: Every day | ORAL | Status: DC | PRN

## 2014-02-12 NOTE — Telephone Encounter (Signed)
Please see prior auth request form on front of paper chart.  Pt must fail 2 preferred drugs, see list attached to form, please advise

## 2014-02-12 NOTE — Telephone Encounter (Signed)
[facility] to let pt know med was switched. Med sent to pharm.

## 2014-02-12 NOTE — Telephone Encounter (Signed)
Do not see any prior rx for zanaflex

## 2014-02-12 NOTE — Telephone Encounter (Signed)
Pt.notified

## 2014-02-12 NOTE — Telephone Encounter (Signed)
Nurses ck chart and see if has tried zanaflex

## 2014-02-12 NOTE — Telephone Encounter (Signed)
Let's try zanaflex 4mg  in place of flexeril at same recommended freq and usage

## 2014-02-13 ENCOUNTER — Ambulatory Visit (INDEPENDENT_AMBULATORY_CARE_PROVIDER_SITE_OTHER): Payer: Medicare Other | Admitting: Cardiovascular Disease

## 2014-02-13 ENCOUNTER — Encounter: Payer: Self-pay | Admitting: Cardiovascular Disease

## 2014-02-13 VITALS — BP 102/56 | HR 77 | Ht 67.0 in | Wt 148.0 lb

## 2014-02-13 DIAGNOSIS — I739 Peripheral vascular disease, unspecified: Secondary | ICD-10-CM

## 2014-02-13 DIAGNOSIS — E785 Hyperlipidemia, unspecified: Secondary | ICD-10-CM | POA: Diagnosis not present

## 2014-02-13 DIAGNOSIS — I10 Essential (primary) hypertension: Secondary | ICD-10-CM | POA: Diagnosis not present

## 2014-02-13 DIAGNOSIS — I2510 Atherosclerotic heart disease of native coronary artery without angina pectoris: Secondary | ICD-10-CM

## 2014-02-13 NOTE — Patient Instructions (Signed)
  We will see you back in follow up in 1 year with Dr Gwenlyn Found  Dr Gwenlyn Found has ordered: 1.  Echocardiogram. Echocardiography is a painless test that uses sound waves to create images of your heart. It provides your doctor with information about the size and shape of your heart and how well your heart's chambers and valves are working. This procedure takes approximately one hour. There are no restrictions for this procedure.

## 2014-02-13 NOTE — Progress Notes (Signed)
02/13/2014 [name]   02-22-1935  [id number]  Primary Physician [name], MD Primary Cardiologist: Lorretta Harp MD Renae Gloss   HPI:  The patient is a this 79 year old mildly overweight widowed [place] American female, mother of 2 and grandmother to 1 grandchild, whom I last saw in the office 75months ago. She is accompanied by one of her daughters today.Her risk factors include continued tobacco abuse of approximately 1/2 pack per day, having smoked 25-50 pack years, recalcitrant to risk-factor modification. She has treated hypertension, hyperlipidemia, and a positive family history for heart disease with a mother who died of a myocardial infarction. She had an abdominal aortic tube graft placement by Dr. Mamie Nick February 2008. I catheterized her March 25, 2010, revealing moderate CAD in a left-dominant system with normal LV function. Her tube graft was intact and her bifurcation was tortuous. Since I saw her a year ago she has unfortunately developed lung cancer. 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 She has been to with radiation therapy as well as chemotherapy by Dr. Barnet Glasgow in [place] (carboplatin/Taxol). She has stopped smoking since her diagnosis. She has complained of increasing shortness of breath. She was also lost 30 pounds in the last 2 years.  Current Outpatient Prescriptions  Medication Sig Dispense Refill  . allopurinol (ZYLOPRIM) 300 MG tablet Take 300 mg by mouth daily.      Marland Kitchen ALPRAZolam (XANAX) 1 MG tablet TAKE ONE HALF TABLET TO ONE TABLET TID PRN ANXIETY  90 tablet  5  . amLODipine (NORVASC) 10 MG tablet take 1 tablet at bedtime STOP VERAPAMIL.  30 tablet  5  . aspirin EC 81 MG tablet Take 81 mg by mouth daily.      . Cholecalciferol 1000 UNITS capsule Take 1,000 Units by mouth daily.      . enalapril (VASOTEC) 20 MG tablet TAKE ONE TABLET BY MOUTH ONCE DAILY   30 tablet  5  . esomeprazole (NEXIUM) 40 MG capsule TAKE ONE CAPSULE BY MOUTH ONCE DAILY  30 capsule  5  . fenofibrate 160 MG tablet take 1 tablet by mouth once daily with food  30 tablet  5  . ferrous sulfate (FERROUSUL) 325 (65 FE) MG tablet Take 1 tablet (325 mg total) by mouth 2 (two) times daily after a meal.    3  . furosemide (LASIX) 20 MG tablet take 1 tablet by mouth once daily  30 tablet  5  . hydrALAZINE (APRESOLINE) 25 MG tablet take 1 tablet by mouth three times a day  90 tablet  6  . hydrOXYzine (ATARAX/VISTARIL) 25 MG tablet Take 1 tablet (25 mg total) by mouth 3 (three) times daily as needed for itching.  60 tablet  5  . levofloxacin (LEVAQUIN) 500 MG tablet Take 1 tablet (500 mg total) by mouth daily.  6 tablet  0  . levothyroxine (SYNTHROID, LEVOTHROID) 137 MCG tablet take 1 tablet by mouth once daily  30 tablet  5  . loperamide (IMODIUM) 2 MG capsule Take 2 mg by mouth 4 (four) times daily as needed for diarrhea or loose stools.      . magnesium oxide (MAG-OX) 400 MG tablet Take 750 mg by mouth daily.       Marland Kitchen PARoxetine (PAXIL) 40 MG tablet Take 1 tablet (40 mg total) by mouth daily.  30 tablet  5  . potassium chloride 20 MEQ TBCR Take 10  mEq by mouth daily.  30 tablet  6  . pravastatin (PRAVACHOL) 20 MG tablet take 1 tablet by mouth once daily  30 tablet  5  . predniSONE (DELTASONE) 20 MG tablet Take one tablet daily in the AM to limit coughing.  30 tablet  2  . temazepam (RESTORIL) 30 MG capsule Take one capsule at bedtime as the for sleep.  30 capsule  5  . tiotropium (SPIRIVA HANDIHALER) 18 MCG inhalation capsule Place 1 capsule (18 mcg total) into inhaler and inhale once.  30 capsule  5  . tiZANidine (ZANAFLEX) 4 MG capsule Take 1 capsule (4 mg total) by mouth daily as needed for muscle spasms.  30 capsule  5  . traMADol (ULTRAM) 50 MG tablet Take by mouth every 6 (six) hours as needed.       No current facility-administered medications for this visit.    Allergies    Allergen Reactions  . Aleve [Naproxen Sodium] Other (See Comments)    GI bleed  . Dilaudid [Hydromorphone Hcl]     Can not tolerate  . Dyazide [Hydrochlorothiazide W-Triamterene] Other (See Comments)    cramps  . Zithromax [Azithromycin] Itching    History   Social History  . Marital Status: Divorced    Spouse Name: N/A    Number of Children: 2  . Years of Education: N/A   Occupational History  .     Social History Main Topics  . Smoking status: Former Smoker -- 0.50 packs/day for 59 years    Types: Cigarettes  . Smokeless tobacco: Never Used     Comment: smoking since age [age]  . Alcohol Use: No  . Drug Use: No  . Sexual Activity: Not on file   Other Topics Concern  . Not on file   Social History Narrative  . No narrative on file     Review of Systems: General: negative for chills, fever, night sweats or weight changes.  Cardiovascular: negative for chest pain, dyspnea on exertion, edema, orthopnea, palpitations, paroxysmal nocturnal dyspnea or shortness of breath Dermatological: negative for rash Respiratory: negative for cough or wheezing Urologic: negative for hematuria Abdominal: negative for nausea, vomiting, diarrhea, bright red blood per rectum, melena, or hematemesis Neurologic: negative for visual changes, syncope, or dizziness All other systems reviewed and are otherwise negative except as noted above.    Blood pressure 102/56, pulse 77, height 5\' 7"  (1.702 m), weight 148 lb (67.132 kg).  General appearance: alert and no distress Neck: no adenopathy, no carotid bruit, no JVD, supple, symmetrical, trachea midline and thyroid not enlarged, symmetric, no tenderness/mass/nodules Lungs: clear to auscultation bilaterally Heart: regular rate and rhythm, S1, S2 normal, no murmur, click, rub or gallop Extremities: extremities normal, atraumatic, no cyanosis or edema  EKG normal sinus rhythm at 77 with evidence of left ventricular hypertrophy and  nonspecific ST and T wave changes  ASSESSMENT AND PLAN:   Essential hypertension Controlled on current medications  Hyperlipidemia On statin therapy with recent lipid profile performed in 01/20/14 revealed a total cholesterol 242, LDL of 104 and HDL of 121.  Peripheral arterial disease History of infrarenal abdominal aortic tube graft placed by Dr. Thayer Jew in February of 2008. I image this at the time of cardiac catheterization 03/25/10 revealing it to be widely patent.  Coronary artery disease History of noncritical CAD by cath 03/25/10      Lorretta Harp MD [facility], [facility] 02/13/2014 9:43 AM

## 2014-02-13 NOTE — Assessment & Plan Note (Signed)
Controlled on current medications 

## 2014-02-13 NOTE — Assessment & Plan Note (Signed)
On statin therapy with recent lipid profile performed in 01/20/14 revealed a total cholesterol 242, LDL of 104 and HDL of 121.

## 2014-02-13 NOTE — Assessment & Plan Note (Signed)
History of infrarenal abdominal aortic tube graft placed by Dr. Thayer Jew in February of 2008. I image this at the time of cardiac catheterization 03/25/10 revealing it to be widely patent.

## 2014-02-13 NOTE — Assessment & Plan Note (Signed)
History of noncritical CAD by cath 03/25/10

## 2014-02-16 ENCOUNTER — Ambulatory Visit (HOSPITAL_COMMUNITY)
Admission: RE | Admit: 2014-02-16 | Discharge: 2014-02-16 | Disposition: A | Discharge status: Home / Self Care | Payer: Medicare Other | Source: Ambulatory Visit | Attending: Cardiology | Admitting: Cardiology

## 2014-02-16 DIAGNOSIS — I517 Cardiomegaly: Secondary | ICD-10-CM

## 2014-02-16 DIAGNOSIS — I2510 Atherosclerotic heart disease of native coronary artery without angina pectoris: Secondary | ICD-10-CM | POA: Insufficient documentation

## 2014-02-16 DIAGNOSIS — I10 Essential (primary) hypertension: Secondary | ICD-10-CM

## 2014-02-16 DIAGNOSIS — E785 Hyperlipidemia, unspecified: Secondary | ICD-10-CM

## 2014-02-16 DIAGNOSIS — I739 Peripheral vascular disease, unspecified: Secondary | ICD-10-CM

## 2014-02-16 NOTE — Progress Notes (Signed)
2D Echocardiogram Complete.  02/16/2014   Shiana Rappleye, RDCS

## 2014-02-17 ENCOUNTER — Ambulatory Visit (HOSPITAL_COMMUNITY): Payer: Medicare Other

## 2014-02-20 ENCOUNTER — Other Ambulatory Visit (HOSPITAL_COMMUNITY): Payer: Self-pay | Admitting: Oncology

## 2014-02-20 ENCOUNTER — Telehealth (HOSPITAL_COMMUNITY): Payer: Self-pay | Admitting: *Deleted

## 2014-02-20 DIAGNOSIS — R6251 Failure to thrive (child): Secondary | ICD-10-CM

## 2014-02-20 MED ORDER — PREDNISONE 20 MG PO TABS: ORAL_TABLET | ORAL | Status: DC

## 2014-02-24 ENCOUNTER — Emergency Department (HOSPITAL_COMMUNITY)
Admission: EM | Admit: 2014-02-24 | Discharge: 2014-02-24 | Disposition: A | Discharge status: Home / Self Care | Payer: Medicare Other | Attending: Emergency Medicine | Admitting: Emergency Medicine

## 2014-02-24 ENCOUNTER — Emergency Department (HOSPITAL_COMMUNITY): Payer: Medicare Other

## 2014-02-24 ENCOUNTER — Encounter (HOSPITAL_COMMUNITY): Payer: Self-pay | Admitting: Emergency Medicine

## 2014-02-24 DIAGNOSIS — M129 Arthropathy, unspecified: Secondary | ICD-10-CM | POA: Insufficient documentation

## 2014-02-24 DIAGNOSIS — E039 Hypothyroidism, unspecified: Secondary | ICD-10-CM | POA: Insufficient documentation

## 2014-02-24 DIAGNOSIS — R0602 Shortness of breath: Secondary | ICD-10-CM | POA: Insufficient documentation

## 2014-02-24 DIAGNOSIS — F341 Dysthymic disorder: Secondary | ICD-10-CM | POA: Diagnosis not present

## 2014-02-24 DIAGNOSIS — I2510 Atherosclerotic heart disease of native coronary artery without angina pectoris: Secondary | ICD-10-CM | POA: Insufficient documentation

## 2014-02-24 DIAGNOSIS — Z85118 Personal history of other malignant neoplasm of bronchus and lung: Secondary | ICD-10-CM | POA: Insufficient documentation

## 2014-02-24 DIAGNOSIS — Z8719 Personal history of other diseases of the digestive system: Secondary | ICD-10-CM | POA: Diagnosis not present

## 2014-02-24 DIAGNOSIS — Z87891 Personal history of nicotine dependence: Secondary | ICD-10-CM | POA: Diagnosis not present

## 2014-02-24 DIAGNOSIS — F411 Generalized anxiety disorder: Secondary | ICD-10-CM | POA: Insufficient documentation

## 2014-02-24 DIAGNOSIS — C3490 Malignant neoplasm of unspecified part of unspecified bronchus or lung: Secondary | ICD-10-CM | POA: Diagnosis not present

## 2014-02-24 DIAGNOSIS — IMO0002 Reserved for concepts with insufficient information to code with codable children: Secondary | ICD-10-CM | POA: Insufficient documentation

## 2014-02-24 DIAGNOSIS — Z7982 Long term (current) use of aspirin: Secondary | ICD-10-CM | POA: Diagnosis not present

## 2014-02-24 DIAGNOSIS — I10 Essential (primary) hypertension: Secondary | ICD-10-CM | POA: Diagnosis not present

## 2014-02-24 DIAGNOSIS — F329 Major depressive disorder, single episode, unspecified: Secondary | ICD-10-CM | POA: Insufficient documentation

## 2014-02-24 DIAGNOSIS — F3289 Other specified depressive episodes: Secondary | ICD-10-CM | POA: Insufficient documentation

## 2014-02-24 DIAGNOSIS — Z87448 Personal history of other diseases of urinary system: Secondary | ICD-10-CM | POA: Insufficient documentation

## 2014-02-24 DIAGNOSIS — F419 Anxiety disorder, unspecified: Secondary | ICD-10-CM

## 2014-02-24 DIAGNOSIS — Z79899 Other long term (current) drug therapy: Secondary | ICD-10-CM | POA: Diagnosis not present

## 2014-02-24 DIAGNOSIS — F32A Depression, unspecified: Secondary | ICD-10-CM

## 2014-02-24 LAB — COMPREHENSIVE METABOLIC PANEL
ALT: 9 U/L (ref 0–35)
AST: 18 U/L (ref 0–37)
Albumin: 3.8 g/dL (ref 3.5–5.2)
Alkaline Phosphatase: 49 U/L (ref 39–117)
Anion gap: 15 (ref 5–15)
BUN: 24 mg/dL — ABNORMAL HIGH (ref 6–23)
CO2: 22 mEq/L (ref 19–32)
Calcium: 9.5 mg/dL (ref 8.4–10.5)
Chloride: 96 mEq/L (ref 96–112)
Creatinine, Ser: 2.09 mg/dL — ABNORMAL HIGH (ref 0.50–1.10)
GFR calc Af Amer: 25 mL/min — ABNORMAL LOW (ref >90)
GFR calc non Af Amer: 22 mL/min — ABNORMAL LOW (ref >90)
Glucose, Bld: 139 mg/dL — ABNORMAL HIGH (ref 70–99)
Potassium: 3.8 mEq/L (ref 3.7–5.3)
Sodium: 133 mEq/L — ABNORMAL LOW (ref 137–147)
Total Bilirubin: 0.5 mg/dL (ref 0.3–1.2)
Total Protein: 6.5 g/dL (ref 6.0–8.3)

## 2014-02-24 LAB — CBC
HCT: 34.8 % — ABNORMAL LOW (ref 36.0–46.0)
Hemoglobin: 11.8 g/dL — ABNORMAL LOW (ref 12.0–15.0)
MCH: 32.3 pg (ref 26.0–34.0)
MCHC: 33.9 g/dL (ref 30.0–36.0)
MCV: 95.3 fL (ref 78.0–100.0)
Platelets: 243 10*3/uL (ref 150–400)
RBC: 3.65 MIL/uL — ABNORMAL LOW (ref 3.87–5.11)
RDW: 13.8 % (ref 11.5–15.5)
WBC: 6 10*3/uL (ref 4.0–10.5)

## 2014-02-24 LAB — I-STAT TROPONIN, ED: Troponin i, poc: 0.02 ng/mL (ref 0.00–0.08)

## 2014-02-24 MED ORDER — SODIUM CHLORIDE 0.9 % IV BOLUS (SEPSIS)
500.0000 mL | Freq: Once | INTRAVENOUS | Status: AC
  Administered 2014-02-24: 500 mL via INTRAVENOUS

## 2014-02-24 MED ORDER — HEPARIN SOD (PORK) LOCK FLUSH 100 UNIT/ML IV SOLN
500.0000 [IU] | Freq: Once | INTRAVENOUS | Status: AC
  Administered 2014-02-24: 500 [IU]
  Filled 2014-02-24: qty 5

## 2014-02-24 NOTE — ED Notes (Signed)
Pt presents with increased anxiety for the past 4 weeks- PCP increased dose of Xanax 2 weeks ago, family reports stopping the increase a week after starting because of pt sleeping all the time.  Pt has also been more short of breath for the past 4 weeks- hx of lung cancer, last chemo treatment was in April- lung sounds diminished throughout.  Pt denies chest pain.  Pt has tremor at present- family reports this is normal.  Pt denies any pain at this time.

## 2014-02-24 NOTE — ED Provider Notes (Signed)
CSN: [id number]     Arrival date & time 02/24/14  1709 History   First MD Initiated Contact with Patient 02/24/14 1758     Chief Complaint  Patient presents with  . Shortness of Breath     (Consider location/radiation/quality/duration/timing/severity/associated sxs/prior Treatment) HPI Pt is a 79yo female with hx of lung cancer accompanied by family with c/o shortness of breat as well as worsening intermittent anxiety and depression.  Family states pt received an aggressive cancer treatment, last tx back in April 2015 but states pt has been worsening as far as her mental health over the last 4-6 weeks.  Pt did have previous anxiety and depression but family is concerned it is worsening to an extent that it is causing her memory to fade. Family states she becomes very anxious over activities of daily living such as paying bills. When pt becomes anxious she has difficulty recalling family phone numbers and has a worsening diffuse tremor that comes and goes.  Pt has been evaluated by her oncologist who states the tumor looks to have improved, pt was seen by PCP who increased her dose of Xanax from 1.5mg  to 3.0mg  as well as increased her Paxil from 30mg  to 40mg  but family states pt only sleeps more so they put her back on her previous lower doses of medication. Family states they want help with pt as she is not improving as they feel she should as far as her mental health.    Past Medical History  Diagnosis Date  . Hypertension   . Hypothyroidism   . Hyperlipidemia   . Insomnia   . GERD (gastroesophageal reflux disease)     chronic gastritis  . Adrenal adenoma   . Chronic diarrhea   . Fatty liver   . Arthritis     knee  . QT prolongation     syncope  . Coronary artery disease   . Impaired fasting glucose   . Renal insufficiency     low protien diet  . Tobacco use     1/2 ppd, approx 25-50 pack years (as of 08/2012)  . Family history of heart disease   . IBS (irritable bowel syndrome)    . Peripheral arterial disease     sstatus post  infrarenal abdominal aortic tube graft placed by Dr. Victorino Dike February 2008  . Cancer     Lung  . Squamous cell carcinoma of left lung 07/29/2013   Past Surgical History  Procedure Laterality Date  . Abdominal hysterectomy    . Thyroidectomy, partial    . Colonoscopy  5/04    normal  . Abdominal aortic aneurysm repair  07/2006    D. J.D. Kellie Simmering  . Cataract extraction Bilateral 2010  . Cardiac catheterization  02/2010    mod CAD in L-dominant system with normal LV function  . Colon resection  2005    1/2 colon removed  . Transthoracic echocardiogram  02/2010    EF 55-60%; mild conc LVH, normal systolic function; mildly calcified AV annulus  . Colonoscopy with esophagogastroduodenoscopy (egd) N/A 03/19/2013    Procedure: COLONOSCOPY WITH ESOPHAGOGASTRODUODENOSCOPY (EGD);  Surgeon: Rogene Houston, MD;  Location: [facility];  Service: Endoscopy;  Laterality: N/A;  145  . Dg biopsy lung Left Jan 2015   Family History  Problem Relation Age of Onset  . Hypertension Mother   . Diabetes Mother   . Heart attack Mother   . Hyperlipidemia Sister     x3 sister  . Kidney  disease Brother    History  Substance Use Topics  . Smoking status: Former Smoker -- 0.50 packs/day for 59 years    Types: Cigarettes  . Smokeless tobacco: Never Used     Comment: smoking since age [age]  . Alcohol Use: No   OB History   Grav Para Term Preterm Abortions TAB SAB Ect Mult Living                 Review of Systems  Constitutional: Positive for fatigue. Negative for fever and chills.  Respiratory: Positive for shortness of breath. Negative for cough.   Cardiovascular: Negative for chest pain and palpitations.  Gastrointestinal: Negative for nausea, vomiting, abdominal pain and diarrhea.  Neurological: Positive for tremors and weakness ( generalized). Negative for seizures, syncope and numbness.  Psychiatric/Behavioral: Positive for confusion,  dysphoric mood and decreased concentration. Negative for suicidal ideas, hallucinations, self-injury and agitation. The patient is nervous/anxious.   All other systems reviewed and are negative.     Allergies  Aleve; Dilaudid; Dyazide; and Zithromax  Home Medications   Prior to Admission medications   Medication Sig Start Date End Date Taking? Authorizing Provider  allopurinol (ZYLOPRIM) 300 MG tablet Take 300 mg by mouth daily.   Yes Historical Provider, MD  ALPRAZolam Duanne Moron) 1 MG tablet Take 1 mg by mouth 3 (three) times daily as needed for anxiety.   Yes Historical Provider, MD  amLODipine (NORVASC) 10 MG tablet Take 10 mg by mouth daily.   Yes Historical Provider, MD  aspirin EC 81 MG tablet Take 81 mg by mouth daily.   Yes Historical Provider, MD  enalapril (VASOTEC) 20 MG tablet Take 20 mg by mouth daily.   Yes Historical Provider, MD  esomeprazole (NEXIUM) 40 MG capsule Take 40 mg by mouth daily at 12 noon.   Yes Historical Provider, MD  fenofibrate 160 MG tablet Take 160 mg by mouth daily.   Yes Historical Provider, MD  ferrous sulfate (FERROUSUL) 325 (65 FE) MG tablet Take 1 tablet (325 mg total) by mouth 2 (two) times daily after a meal. 03/19/13  Yes Rogene Houston, MD  furosemide (LASIX) 20 MG tablet Take 20 mg by mouth daily.   Yes Historical Provider, MD  hydrALAZINE (APRESOLINE) 25 MG tablet Take 25 mg by mouth 3 (three) times daily.   Yes Historical Provider, MD  levothyroxine (SYNTHROID, LEVOTHROID) 137 MCG tablet Take 137 mcg by mouth daily before breakfast.   Yes Historical Provider, MD  loperamide (IMODIUM) 2 MG capsule Take 2 mg by mouth 4 (four) times daily as needed for diarrhea or loose stools.   Yes Historical Provider, MD  magnesium oxide (MAG-OX) 400 MG tablet Take 800 mg by mouth daily.    Yes Historical Provider, MD  PARoxetine (PAXIL) 40 MG tablet Take 1 tablet (40 mg total) by mouth daily. 01/20/14  Yes Mikey Kirschner, MD  potassium chloride 20 MEQ TBCR  Take 10 mEq by mouth daily. 12/15/13  Yes Farrel Gobble, MD  pravastatin (PRAVACHOL) 20 MG tablet take 1 tablet by mouth once daily 10/30/13  Yes Mikey Kirschner, MD  predniSONE (DELTASONE) 20 MG tablet Take 20 mg by mouth daily with breakfast.   Yes Historical Provider, MD  temazepam (RESTORIL) 30 MG capsule Take 30 mg by mouth at bedtime as needed for sleep.   Yes Historical Provider, MD  tiotropium (SPIRIVA) 18 MCG inhalation capsule Place 18 mcg into inhaler and inhale daily as needed. Shortness of breath   Yes  Historical Provider, MD  tiZANidine (ZANAFLEX) 4 MG capsule Take 1 capsule (4 mg total) by mouth daily as needed for muscle spasms. 02/12/14  Yes Mikey Kirschner, MD  traMADol (ULTRAM) 50 MG tablet Take 50 mg by mouth every 6 (six) hours as needed for moderate pain.    Yes Historical Provider, MD   BP 135/70  Pulse 68  Temp(Src) 98.8 F (37.1 C) (Oral)  Resp 18  SpO2 98% Physical Exam  Nursing note and vitals reviewed. Constitutional:  Frail appearing elderly female, diffuse mild tremor, NAD  HENT:  Head: Normocephalic and atraumatic.  Eyes: Conjunctivae and EOM are normal. Pupils are equal, round, and reactive to light. No scleral icterus.  Neck: Normal range of motion.  Cardiovascular: Normal rate, regular rhythm and normal heart sounds.   Regular rate and rhythm  Pulmonary/Chest: Effort normal and breath sounds normal. No respiratory distress. She has no wheezes. She has no rales. She exhibits no tenderness.  No respiratory distress, lungs: CTAB  Abdominal: Soft. Bowel sounds are normal. She exhibits no distension and no mass. There is no tenderness. There is no rebound and no guarding.  Musculoskeletal: Normal range of motion.  Neurological: She is alert.  Oriented to person and place, limited with conversation but cooperative during exam. Able to follow commands. Diffuse mild essential tremor. 5/5 strength in upper and lower extremities bilaterally.   Skin: Skin is warm  and dry.    ED Course  Procedures (including critical care time) Labs Review Labs Reviewed  CBC - Abnormal; Notable for the following:    RBC 3.65 (*)    Hemoglobin 11.8 (*)    HCT 34.8 (*)    All other components within normal limits  COMPREHENSIVE METABOLIC PANEL - Abnormal; Notable for the following:    Sodium 133 (*)    Glucose, Bld 139 (*)    BUN 24 (*)    Creatinine, Ser 2.09 (*)    GFR calc non Af Amer 22 (*)    GFR calc Af Amer 25 (*)    All other components within normal limits  I-STAT TROPOININ, ED    Imaging Review Dg Chest 2 View  02/24/2014   CLINICAL DATA:  Shortness of breath, lung cancer.  EXAM: CHEST  2 VIEW  COMPARISON:  Chest radiograph February 08, 2014  FINDINGS: The cardiac silhouette remains mildly enlarged, mediastinal silhouette is nonsuspicious ; calcified aortic knob. Similar left hilar, left lung base interstitial prominence without pleural effusions or new focal consolidation. No pneumothorax. Single lumen right chest Port-A-Cath projects in the distal superior vena cava region. No pneumothorax.  Included view of the abdomen demonstrate surgical clips. Osseous structures are nonsuspicious, mild broad levoscoliosis could be positional.  IMPRESSION: Mild cardiomegaly. Similar left are mediastinal post treatment changes appearing   Electronically Signed   By: Elon Alas   On: 02/24/2014 19:06     EKG Interpretation None      MDM   Final diagnoses:  Anxiety and depression    Pt is a 79yo female brought to ED by family with concerns for pt's worsening anxiety and depression. Symptoms have been intermittent but gradually worsening x6 weeks. Pt is s/p "aggressive treatment" of lung cancer.  No acute changes.  Vitals reviewed: WNL, afebrile. Pt does have diffuse essential tremor. No focal neuro deficit.  Symptoms appear chronic in nature. No evidence of emergent process, including pneumonia or UTI taking place at this time. No evidence of CVA or SAH.   Discussed pt  with Dr. Wilson Singer who also examined pt and spoke with family, pt may be discharged home with resources provided by social work.  Symptoms likely due to aging and possibly start of early dementia.  Dr. Wilson Singer offered to tweak pt's current anxiety and depression medication, family declined.  Will discharge pt home to f/u with PCP, oncology as well as social work resources. Family verbalized understanding and agreement with tx plan.    Noland Fordyce, PA-C 02/24/14 2307

## 2014-02-24 NOTE — Discharge Instructions (Signed)
Be sure to follow up with resources provided to you by social work earlier today.  See below for further instructions.   Depression Depression is feeling sad, low, down in the dumps, blue, gloomy, or empty. In general, there are two kinds of depression:  Normal sadness or grief. This can happen after something upsetting. It often goes away on its own within 2 weeks. After losing a loved one (bereavement), normal sadness and grief may last longer than two weeks. It usually gets better with time.  Clinical depression. This kind lasts longer than normal sadness or grief. It keeps you from doing the things you normally do in life. It is often hard to function at home, work, or at school. It may affect your relationships with others. Treatment is often needed. GET HELP RIGHT AWAY IF:  You have thoughts about hurting yourself or others.  You lose touch with reality (psychotic symptoms). You may:  See or hear things that are not real.  Have untrue beliefs about your life or people around you.  Your medicine is giving you problems. MAKE SURE YOU:  Understand these instructions.  Will watch your condition.  Will get help right away if you are not doing well or get worse. Document Released: 07/15/2010 Document Revised: 10/27/2013 Document Reviewed: 10/12/2011 [facility] Patient Information 2015 [place], [place]. This information is not intended to replace advice given to you by your health care provider. Make sure you discuss any questions you have with your health care provider.  Panic Attacks Panic attacks are sudden, short feelings of great fear or discomfort. You may have them for no reason when you are relaxed, when you are uneasy (anxious), or when you are sleeping.  HOME CARE  Take all your medicines as told.  Check with your doctor before starting new medicines.  Keep all doctor visits. GET HELP IF:  You are not able to take your medicines as told.  Your symptoms do not get  better.  Your symptoms get worse. GET HELP RIGHT AWAY IF:  Your attacks seem different than your normal attacks.  You have thoughts about hurting yourself or others.  You take panic attack medicine and you have a side effect. MAKE SURE YOU:  Understand these instructions.  Will watch your condition.  Will get help right away if you are not doing well or get worse. Document Released: 07/15/2010 Document Revised: 04/02/2013 Document Reviewed: 01/24/2013 [facility] Patient Information 2015 [place], [place]. This information is not intended to replace advice given to you by your health care provider. Make sure you discuss any questions you have with your health care provider.

## 2014-02-24 NOTE — ED Notes (Signed)
Provided pt and her daughter with mental health/counseling resources for f/u.

## 2014-02-25 ENCOUNTER — Other Ambulatory Visit: Payer: Self-pay | Admitting: Family Medicine

## 2014-02-26 ENCOUNTER — Telehealth (HOSPITAL_COMMUNITY): Payer: Self-pay

## 2014-02-27 NOTE — ED Provider Notes (Signed)
Medical screening examination/treatment/procedure(s) were conducted as a shared visit with non-physician practitioner(s) and myself.  I personally evaluated the patient during the encounter.   EKG Interpretation   Date/Time:  Tuesday February 24 2014 17:24:10 EDT Ventricular Rate:  82 PR Interval:  158 QRS Duration: 78 QT Interval:  396 QTC Calculation: 462 R Axis:   92 Text Interpretation:  Normal sinus rhythm Possible Left atrial enlargement  Rightward axis Anteroseptal infarct , age undetermined Abnormal ECG ED  PHYSICIAN INTERPRETATION AVAILABLE IN [facility] Confirmed by TEST,  Record ([id number]) on 02/26/2014 7:08:92 AM     79 year old female essentially with anxiety/depression. She has a long-standing history of anxiety. Likely exacerbated with aging process and/or early dementia. Recently treated for lung cancer. Possibly some element PTSD with regards to this although she seems to have responded very well. She is currently on Xanax and Paxil. Reports compliance. This was recently increased but then subsequently decreased back to previous level due to oversedation. Family and patient are concerned about a tremor. She has an essential tremor. This is likely exacerbated by her recent stress. Patient has very good family support. She does not interact much outside of her family though. I feel she'll likely benefit from participation in senior groups, church groups, support groups, etc. Low suspicion for more sinister explanation for her symptoms. I feel she is stable for discharge. Discussed at length with patient and multiple family members.  Virgel Manifold, MD 02/27/14 [phone]

## 2014-03-02 ENCOUNTER — Other Ambulatory Visit: Payer: Self-pay | Admitting: Family Medicine

## 2014-03-10 ENCOUNTER — Encounter: Payer: Self-pay | Admitting: Family Medicine

## 2014-03-10 ENCOUNTER — Ambulatory Visit (INDEPENDENT_AMBULATORY_CARE_PROVIDER_SITE_OTHER): Payer: Medicare Other | Admitting: Family Medicine

## 2014-03-10 VITALS — BP 120/80 | Ht 67.0 in | Wt 146.0 lb

## 2014-03-10 DIAGNOSIS — Z23 Encounter for immunization: Secondary | ICD-10-CM

## 2014-03-10 DIAGNOSIS — I10 Essential (primary) hypertension: Secondary | ICD-10-CM

## 2014-03-10 DIAGNOSIS — I2510 Atherosclerotic heart disease of native coronary artery without angina pectoris: Secondary | ICD-10-CM | POA: Diagnosis not present

## 2014-03-10 MED ORDER — AMLODIPINE BESYLATE 5 MG PO TABS: 5.0000 mg | ORAL_TABLET | Freq: Every day | ORAL | Status: DC

## 2014-03-10 NOTE — Progress Notes (Signed)
   Subjective:    Patient ID: [name], female    DOB: Jan 22, 1935, 79 y.o.   MRN: [id number]  HPI Patient here today for medication management.   Patient has brought all her medications with her today.   On further history patient is noting increased dizziness. When she stands quickly she becomes lightheaded. When checked elsewhere her blood pressure has been on the low side.  Has lost weight with her cancer and treatment recently.  Review of Systems No headache no abdominal pain no change in bowel habits no blood in stool    Objective:   Physical Exam  Alert no apparent distress blood pressure 377 systolic over 72 lungs clear. Heart rare rhythm. Ankles edema.     Assessment & Plan:  Impression hypertension with somewhat excessive treatment. Plan back off on Norvasc rationale discussed. Maintain other meds same for now. Followup as scheduled. WSL

## 2014-03-13 ENCOUNTER — Encounter (HOSPITAL_COMMUNITY): Payer: Medicare Other

## 2014-03-26 ENCOUNTER — Other Ambulatory Visit: Payer: Self-pay | Admitting: Family Medicine

## 2014-04-07 ENCOUNTER — Encounter (HOSPITAL_COMMUNITY): Payer: Medicare Other | Attending: Hematology and Oncology

## 2014-04-07 VITALS — BP 117/63 | HR 89 | Temp 98.0°F | Resp 18 | Wt 155.0 lb

## 2014-04-07 DIAGNOSIS — C3492 Malignant neoplasm of unspecified part of left bronchus or lung: Secondary | ICD-10-CM | POA: Insufficient documentation

## 2014-04-07 DIAGNOSIS — Z452 Encounter for adjustment and management of vascular access device: Secondary | ICD-10-CM

## 2014-04-07 DIAGNOSIS — Z95828 Presence of other vascular implants and grafts: Secondary | ICD-10-CM

## 2014-04-07 LAB — CBC WITH DIFFERENTIAL/PLATELET
BASOS PCT: 0 % (ref 0–1)
Basophils Absolute: 0 10*3/uL (ref 0.0–0.1)
Eosinophils Absolute: 0 10*3/uL (ref 0.0–0.7)
Eosinophils Relative: 0 % (ref 0–5)
HEMATOCRIT: 33.4 % — AB (ref 36.0–46.0)
HEMOGLOBIN: 11 g/dL — AB (ref 12.0–15.0)
LYMPHS ABS: 0.4 10*3/uL — AB (ref 0.7–4.0)
Lymphocytes Relative: 7 % — ABNORMAL LOW (ref 12–46)
MCH: 33.1 pg (ref 26.0–34.0)
MCHC: 32.9 g/dL (ref 30.0–36.0)
MCV: 100.6 fL — ABNORMAL HIGH (ref 78.0–100.0)
MONO ABS: 0.3 10*3/uL (ref 0.1–1.0)
MONOS PCT: 4 % (ref 3–12)
Neutro Abs: 5.4 10*3/uL (ref 1.7–7.7)
Neutrophils Relative %: 89 % — ABNORMAL HIGH (ref 43–77)
Platelets: 219 10*3/uL (ref 150–400)
RBC: 3.32 MIL/uL — ABNORMAL LOW (ref 3.87–5.11)
RDW: 15.2 % (ref 11.5–15.5)
WBC: 6.1 10*3/uL (ref 4.0–10.5)

## 2014-04-07 LAB — COMPREHENSIVE METABOLIC PANEL
ALT: 11 U/L (ref 0–35)
ANION GAP: 13 (ref 5–15)
AST: 21 U/L (ref 0–37)
Albumin: 3.7 g/dL (ref 3.5–5.2)
Alkaline Phosphatase: 63 U/L (ref 39–117)
BILIRUBIN TOTAL: 0.4 mg/dL (ref 0.3–1.2)
BUN: 30 mg/dL — AB (ref 6–23)
CHLORIDE: 101 meq/L (ref 96–112)
CO2: 25 mEq/L (ref 19–32)
Calcium: 9.4 mg/dL (ref 8.4–10.5)
Creatinine, Ser: 2.35 mg/dL — ABNORMAL HIGH (ref 0.50–1.10)
GFR calc non Af Amer: 19 mL/min — ABNORMAL LOW (ref >90)
GFR, EST AFRICAN AMERICAN: 22 mL/min — AB (ref >90)
GLUCOSE: 123 mg/dL — AB (ref 70–99)
Potassium: 4.2 mEq/L (ref 3.7–5.3)
Sodium: 139 mEq/L (ref 137–147)
Total Protein: 6.8 g/dL (ref 6.0–8.3)

## 2014-04-07 LAB — LACTATE DEHYDROGENASE: LDH: 170 U/L (ref 94–250)

## 2014-04-07 MED ORDER — SODIUM CHLORIDE 0.9 % IJ SOLN
10.0000 mL | Freq: Once | INTRAMUSCULAR | Status: AC
  Administered 2014-04-07: 10 mL via INTRAVENOUS

## 2014-04-07 MED ORDER — HEPARIN SOD (PORK) LOCK FLUSH 100 UNIT/ML IV SOLN
500.0000 [IU] | Freq: Once | INTRAVENOUS | Status: AC
  Administered 2014-04-07: 500 [IU] via INTRAVENOUS

## 2014-04-07 MED ORDER — HEPARIN SOD (PORK) LOCK FLUSH 100 UNIT/ML IV SOLN
INTRAVENOUS | Status: AC
  Filled 2014-04-07: qty 5

## 2014-04-07 NOTE — Patient Instructions (Signed)
[facility] Discharge Instructions  RECOMMENDATIONS MADE BY THE CONSULTANT AND ANY TEST RESULTS WILL BE SENT TO YOUR REFERRING PHYSICIAN.  INSTRUCTIONS/FOLLOW-UP: Port flush and lab work today. Return as scheduled.  Thank you for choosing [facility] to provide your oncology and hematology care.  To afford each patient quality time with our providers, please arrive at least 15 minutes before your scheduled appointment time.  With your help, our goal is to use those 15 minutes to complete the necessary work-up to ensure our physicians have the information they need to help with your evaluation and healthcare recommendations.    Effective January 1st, 2014, we ask that you re-schedule your appointment with our physicians should you arrive 10 or more minutes late for your appointment.  We strive to give you quality time with our providers, and arriving late affects you and other patients whose appointments are after yours.    Again, thank you for choosing [facility].  Our hope is that these requests will decrease the amount of time that you wait before being seen by our physicians.       _____________________________________________________________  Should you have questions after your visit to [facility], please contact our office at [phone] between the hours of 8:30 a.m. and 4:30 p.m.  Voicemails left after 4:30 p.m. will not be returned until the following business day.  For prescription refill requests, have your pharmacy contact our office with your prescription refill request.    _______________________________________________________________  We hope that we have given you very good care.  You may receive a patient satisfaction survey in the mail, please complete it and return it as soon as possible.  We value your feedback!  _______________________________________________________________  Have you asked about our STAR  program?  STAR stands for Survivorship Training and Rehabilitation, and this is a nationally recognized cancer care program that focuses on survivorship and rehabilitation.  Cancer and cancer treatments may cause problems, such as, pain, making you feel tired and keeping you from doing the things that you need or want to do. Cancer rehabilitation can help. Our goal is to reduce these troubling effects and help you have the best quality of life possible.  You may receive a survey from a nurse that asks questions about your current state of health.  Based on the survey results, all eligible patients will be referred to the [facility] program for an evaluation so we can better serve you!  A frequently asked questions sheet is available upon request.

## 2014-04-07 NOTE — Progress Notes (Signed)
[name] presented for Portacath access and flush. Proper placement of portacath confirmed by CXR. Portacath located right chest wall accessed with  H 20 needle. Good blood return present. Portacath flushed with 33ml NS and 500U/83ml Heparin and needle removed intact. Procedure without incident. Patient tolerated procedure well.

## 2014-04-07 NOTE — Addendum Note (Signed)
Addended by: Berneta Levins on: 04/07/2014 05:01 PM   Modules accepted: Orders

## 2014-04-14 ENCOUNTER — Other Ambulatory Visit: Payer: Self-pay | Admitting: Family Medicine

## 2014-04-30 ENCOUNTER — Encounter (HOSPITAL_COMMUNITY): Payer: Medicare Other

## 2014-04-30 ENCOUNTER — Encounter (HOSPITAL_COMMUNITY): Payer: Medicare Other | Attending: Hematology and Oncology

## 2014-04-30 ENCOUNTER — Encounter (HOSPITAL_COMMUNITY): Payer: Self-pay

## 2014-04-30 VITALS — BP 144/67 | HR 100 | Temp 99.1°F | Resp 18 | Wt 162.9 lb

## 2014-04-30 DIAGNOSIS — C3402 Malignant neoplasm of left main bronchus: Secondary | ICD-10-CM

## 2014-04-30 DIAGNOSIS — C3432 Malignant neoplasm of lower lobe, left bronchus or lung: Secondary | ICD-10-CM | POA: Diagnosis not present

## 2014-04-30 DIAGNOSIS — R12 Heartburn: Secondary | ICD-10-CM

## 2014-04-30 DIAGNOSIS — F329 Major depressive disorder, single episode, unspecified: Secondary | ICD-10-CM

## 2014-04-30 DIAGNOSIS — G629 Polyneuropathy, unspecified: Secondary | ICD-10-CM

## 2014-04-30 DIAGNOSIS — C3492 Malignant neoplasm of unspecified part of left bronchus or lung: Secondary | ICD-10-CM | POA: Diagnosis not present

## 2014-04-30 LAB — CBC WITH DIFFERENTIAL/PLATELET
Basophils Absolute: 0 10*3/uL (ref 0.0–0.1)
Basophils Relative: 1 % (ref 0–1)
Eosinophils Absolute: 0.1 10*3/uL (ref 0.0–0.7)
Eosinophils Relative: 2 % (ref 0–5)
HCT: 34 % — ABNORMAL LOW (ref 36.0–46.0)
HEMOGLOBIN: 11.1 g/dL — AB (ref 12.0–15.0)
LYMPHS ABS: 0.6 10*3/uL — AB (ref 0.7–4.0)
Lymphocytes Relative: 11 % — ABNORMAL LOW (ref 12–46)
MCH: 32.8 pg (ref 26.0–34.0)
MCHC: 32.6 g/dL (ref 30.0–36.0)
MCV: 100.6 fL — ABNORMAL HIGH (ref 78.0–100.0)
MONOS PCT: 8 % (ref 3–12)
Monocytes Absolute: 0.4 10*3/uL (ref 0.1–1.0)
NEUTROS ABS: 4 10*3/uL (ref 1.7–7.7)
Neutrophils Relative %: 78 % — ABNORMAL HIGH (ref 43–77)
Platelets: 247 10*3/uL (ref 150–400)
RBC: 3.38 MIL/uL — AB (ref 3.87–5.11)
RDW: 15.2 % (ref 11.5–15.5)
WBC: 5.1 10*3/uL (ref 4.0–10.5)

## 2014-04-30 LAB — COMPREHENSIVE METABOLIC PANEL
ALK PHOS: 49 U/L (ref 39–117)
ALT: 11 U/L (ref 0–35)
AST: 22 U/L (ref 0–37)
Albumin: 3.7 g/dL (ref 3.5–5.2)
Anion gap: 15 (ref 5–15)
BILIRUBIN TOTAL: 0.3 mg/dL (ref 0.3–1.2)
BUN: 28 mg/dL — AB (ref 6–23)
CHLORIDE: 101 meq/L (ref 96–112)
CO2: 22 meq/L (ref 19–32)
Calcium: 9.6 mg/dL (ref 8.4–10.5)
Creatinine, Ser: 2.21 mg/dL — ABNORMAL HIGH (ref 0.50–1.10)
GFR calc non Af Amer: 20 mL/min — ABNORMAL LOW (ref >90)
GFR, EST AFRICAN AMERICAN: 24 mL/min — AB (ref >90)
GLUCOSE: 119 mg/dL — AB (ref 70–99)
POTASSIUM: 3.8 meq/L (ref 3.7–5.3)
Sodium: 138 mEq/L (ref 137–147)
Total Protein: 6.7 g/dL (ref 6.0–8.3)

## 2014-04-30 NOTE — Progress Notes (Signed)
[facility]  OFFICE PROGRESS NOTE  [name], MD [place] [address]  DIAGNOSIS: Malignant neoplasm of hilus of left lung - Plan: CT Abdomen Pelvis W Contrast, CT Chest W Contrast, Consult to neurology, CBC with Differential, Comprehensive metabolic panel, CBC with Differential, Comprehensive metabolic panel  Chief Complaint  Patient presents with  . 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 with treatment starting on 08/07/2013 and finishing on 09/12/2013. Paxil dose was increased to 40 mg daily on 01/12/2014 by Marisa Cyphers, LPN  INTERVAL HISTORY: [name] 79 y.o. female returns for followup of locally advanced squamous cell carcinoma of the left lower lobe, status post combined modality therapy utilizing carboplatin/Taxol/radiotherapy with treatment ending on 09/12/2013.  She has had more difficulty with heartburn despite using Nexium plus Tums. She also had 2 episodes of falling to the ground in the setting of chronic tremulousness. She does have neuropathy involving both lower extremities withoutinvolvement of the upper extremities. She denies any nausea, vomiting, diarrhea, constipation, incontinence, or seizure activity. Chronic cough is productive only of clear sputum without hemoptysis as had previously been found at the time of diagnosis. She denies any headache, nasal drip, earache, sore throat, lower extremity swelling or redness, PND, orthopnea, or palpitations. She also denies any chest pain.  MEDICAL HISTORY: Past Medical History  Diagnosis Date  . Hypertension   . Hypothyroidism   . Hyperlipidemia   . Insomnia   . GERD (gastroesophageal reflux  disease)     chronic gastritis  . Adrenal adenoma   . Chronic diarrhea   . Fatty liver   . Arthritis     knee  . QT prolongation     syncope  . Coronary artery disease   . Impaired fasting glucose   . Renal insufficiency     low protien diet  . Tobacco use     1/2 ppd, approx 25-50 pack years (as of 08/2012)  . Family history of heart disease   . IBS (irritable bowel syndrome)   . Peripheral arterial disease     sstatus post  infrarenal abdominal aortic tube graft placed by Dr. Victorino Dike February 2008  . Cancer     Lung  . Squamous cell carcinoma of left lung 07/29/2013    INTERIM HISTORY: has Unspecified hypothyroidism; Pruritic dermatitis; Chronic renal insufficiency; Generalized anxiety disorder; Insomnia; Esophageal reflux; Coronary artery disease; Essential hypertension; Hyperlipidemia; Peripheral arterial disease; Anemia, iron deficiency; Squamous cell carcinoma of left lung; COPD, moderate; and Cholelithiasis on her problem list.     Squamous cell carcinoma of left lung   06/30/2013 Imaging CT of chest demonstrates 4 cm left lower lobe lesion with other concerning findings for malignancy   07/09/2013 Imaging PET- 4.1 cm left lower oobe lesion with left hilar and bilateral mediastinal lymphadeopathy.  Hepatic lesion not confidently identified.  1.8 cm nodule in left upper lobe, cannot exclude low grade tumor.   07/16/2013 Initial Diagnosis Squamous cell carcinoma of left lung via CT-guided biopsy by IR   07/29/2013 Surgery IR placement of port-a-cath in preparation for concomittant chemoradiation    08/08/2013 - 09/12/2013 Chemotherapy Paclitaxel/Carboplatin weekly x 6   08/08/2013 - 09/19/2013 Radiation Therapy    10/12/2013  Imaging     ALLERGIES:  is allergic to aleve; dilaudid; dyazide; and zithromax.  MEDICATIONS: has a current medication list which includes the following prescription(s): allopurinol, alprazolam, amlodipine, aspirin ec, enalapril, esomeprazole, ferrous sulfate,  furosemide, hydralazine, levothyroxine, loperamide, magnesium oxide, paroxetine, potassium chloride er, pravastatin, temazepam, and tizanidine.  SURGICAL HISTORY:  Past Surgical History  Procedure Laterality Date  . Abdominal hysterectomy    . Thyroidectomy, partial    . Colonoscopy  5/04    normal  . Abdominal aortic aneurysm repair  07/2006    D. J.D. Kellie Simmering  . Cataract extraction Bilateral 2010  . Cardiac catheterization  02/2010    mod CAD in L-dominant system with normal LV function  . Colon resection  2005    1/2 colon removed  . Transthoracic echocardiogram  02/2010    EF 55-60%; mild conc LVH, normal systolic function; mildly calcified AV annulus  . Colonoscopy with esophagogastroduodenoscopy (egd) N/A 03/19/2013    Procedure: COLONOSCOPY WITH ESOPHAGOGASTRODUODENOSCOPY (EGD);  Surgeon: Rogene Houston, MD;  Location: [facility];  Service: Endoscopy;  Laterality: N/A;  145  . Dg biopsy lung Left Jan 2015    FAMILY HISTORY: family history includes Diabetes in her mother; Heart attack in her mother; Hyperlipidemia in her sister; Hypertension in her mother; Kidney disease in her brother.  SOCIAL HISTORY:  reports that she has quit smoking. Her smoking use included Cigarettes. She has a 29.5 pack-year smoking history. She has never used smokeless tobacco. She reports that she does not drink alcohol or use illicit drugs.  REVIEW OF SYSTEMS:  Other than that discussed above is noncontributory.  PHYSICAL EXAMINATION: ECOG PERFORMANCE STATUS: 1 - Symptomatic but completely ambulatory  Blood pressure 144/67, pulse 100, temperature 99.1 F (37.3 C), temperature source Oral, resp. rate 18, weight 162 lb 14.4 oz (73.891 kg), SpO2 94 %.  GENERAL:alert, no distress and comfortable. Flat affect. SKIN: skin color, texture, turgor are normal, no rashes or significant lesions EYES: PERLA; Conjunctiva are pink and non-injected, sclera clear SINUSES: No redness or tenderness over maxillary  or ethmoid sinuses OROPHARYNX:no exudate, no erythema on lips, buccal mucosa, or tongue. NECK: supple, thyroid normal size, non-tender, without nodularity. No masses CHEST: increased AP diameter with no breast masses. LYMPH:  no palpable lymphadenopathy in the cervical, axillary or inguinal LUNGS: clear to auscultation and percussion with normal breathing effort HEART: regular rate & rhythm and no murmurs. ABDOMEN:abdomen soft, non-tender and normal bowel sounds MUSCULOSKELETAL:no cyanosis of digits and no clubbing. Range of motion normal.  NEURO: alert & oriented x 3 with fluent speech, no focal motor/sensory deficits. Resting tremor improved on activity. Rigidity is noted in both upper extremities. Pinprick sensation is decreased in both lower extremities .Walks with a wide-based gait. Romberg sign negative.   LABORATORY DATA: Office Visit on 04/30/2014  Component Date Value Ref Range Status  . WBC 04/30/2014 5.1  4.0 - 10.5 K/uL Final  . RBC 04/30/2014 3.38* 3.87 - 5.11 MIL/uL Final  . Hemoglobin 04/30/2014 11.1* 12.0 - 15.0 g/dL Final  . HCT 04/30/2014 34.0* 36.0 - 46.0 % Final  . MCV 04/30/2014 100.6* 78.0 - 100.0 fL Final  . MCH 04/30/2014 32.8  26.0 - 34.0 pg Final  . MCHC 04/30/2014 32.6  30.0 - 36.0 g/dL Final  . RDW 04/30/2014 15.2  11.5 - 15.5 % Final  . Platelets 04/30/2014 247  150 - 400 K/uL Final  . Neutrophils Relative % 04/30/2014 78* 43 - 77 % Final  . Neutro  Abs 04/30/2014 4.0  1.7 - 7.7 K/uL Final  . Lymphocytes Relative 04/30/2014 11* 12 - 46 % Final  . Lymphs Abs 04/30/2014 0.6* 0.7 - 4.0 K/uL Final  . Monocytes Relative 04/30/2014 8  3 - 12 % Final  . Monocytes Absolute 04/30/2014 0.4  0.1 - 1.0 K/uL Final  . Eosinophils Relative 04/30/2014 2  0 - 5 % Final  . Eosinophils Absolute 04/30/2014 0.1  0.0 - 0.7 K/uL Final  . Basophils Relative 04/30/2014 1  0 - 1 % Final  . Basophils Absolute 04/30/2014 0.0  0.0 - 0.1 K/uL Final  . Sodium 04/30/2014 138  137 - 147  mEq/L Final  . Potassium 04/30/2014 3.8  3.7 - 5.3 mEq/L Final  . Chloride 04/30/2014 101  96 - 112 mEq/L Final  . CO2 04/30/2014 22  19 - 32 mEq/L Final  . Glucose, Bld 04/30/2014 119* 70 - 99 mg/dL Final  . BUN 04/30/2014 28* 6 - 23 mg/dL Final  . Creatinine, Ser 04/30/2014 2.21* 0.50 - 1.10 mg/dL Final  . Calcium 04/30/2014 9.6  8.4 - 10.5 mg/dL Final  . Total Protein 04/30/2014 6.7  6.0 - 8.3 g/dL Final  . Albumin 04/30/2014 3.7  3.5 - 5.2 g/dL Final  . AST 04/30/2014 22  0 - 37 U/L Final  . ALT 04/30/2014 11  0 - 35 U/L Final  . Alkaline Phosphatase 04/30/2014 49  39 - 117 U/L Final  . Total Bilirubin 04/30/2014 0.3  0.3 - 1.2 mg/dL Final  . GFR calc non Af Amer 04/30/2014 20* >90 mL/min Final  . GFR calc Af Amer 04/30/2014 24* >90 mL/min Final   Comment: (NOTE) The eGFR has been calculated using the CKD EPI equation. This calculation has not been validated in all clinical situations. eGFR's persistently <90 mL/min signify possible Chronic Kidney Disease.   . Anion gap 04/30/2014 15  5 - 15 Final  Infusion on 04/07/2014  Component Date Value Ref Range Status  . WBC 04/07/2014 6.1  4.0 - 10.5 K/uL Final  . RBC 04/07/2014 3.32* 3.87 - 5.11 MIL/uL Final  . Hemoglobin 04/07/2014 11.0* 12.0 - 15.0 g/dL Final  . HCT 04/07/2014 33.4* 36.0 - 46.0 % Final  . MCV 04/07/2014 100.6* 78.0 - 100.0 fL Final  . MCH 04/07/2014 33.1  26.0 - 34.0 pg Final  . MCHC 04/07/2014 32.9  30.0 - 36.0 g/dL Final  . RDW 04/07/2014 15.2  11.5 - 15.5 % Final  . Platelets 04/07/2014 219  150 - 400 K/uL Final  . Neutrophils Relative % 04/07/2014 89* 43 - 77 % Final  . Neutro Abs 04/07/2014 5.4  1.7 - 7.7 K/uL Final  . Lymphocytes Relative 04/07/2014 7* 12 - 46 % Final  . Lymphs Abs 04/07/2014 0.4* 0.7 - 4.0 K/uL Final  . Monocytes Relative 04/07/2014 4  3 - 12 % Final  . Monocytes Absolute 04/07/2014 0.3  0.1 - 1.0 K/uL Final  . Eosinophils Relative 04/07/2014 0  0 - 5 % Final  . Eosinophils Absolute  04/07/2014 0.0  0.0 - 0.7 K/uL Final  . Basophils Relative 04/07/2014 0  0 - 1 % Final  . Basophils Absolute 04/07/2014 0.0  0.0 - 0.1 K/uL Final  . Sodium 04/07/2014 139  137 - 147 mEq/L Final  . Potassium 04/07/2014 4.2  3.7 - 5.3 mEq/L Final  . Chloride 04/07/2014 101  96 - 112 mEq/L Final  . CO2 04/07/2014 25  19 - 32 mEq/L Final  . Glucose,  Bld 04/07/2014 123* 70 - 99 mg/dL Final  . BUN 04/07/2014 30* 6 - 23 mg/dL Final  . Creatinine, Ser 04/07/2014 2.35* 0.50 - 1.10 mg/dL Final  . Calcium 04/07/2014 9.4  8.4 - 10.5 mg/dL Final  . Total Protein 04/07/2014 6.8  6.0 - 8.3 g/dL Final  . Albumin 04/07/2014 3.7  3.5 - 5.2 g/dL Final  . AST 04/07/2014 21  0 - 37 U/L Final  . ALT 04/07/2014 11  0 - 35 U/L Final  . Alkaline Phosphatase 04/07/2014 63  39 - 117 U/L Final  . Total Bilirubin 04/07/2014 0.4  0.3 - 1.2 mg/dL Final  . GFR calc non Af Amer 04/07/2014 19* >90 mL/min Final  . GFR calc Af Amer 04/07/2014 22* >90 mL/min Final   Comment: (NOTE)                          The eGFR has been calculated using the CKD EPI equation.                          This calculation has not been validated in all clinical situations.                          eGFR's persistently <90 mL/min signify possible Chronic Kidney                          Disease.  . Anion gap 04/07/2014 13  5 - 15 Final  . LDH 04/07/2014 170  94 - 250 U/L Final    PATHOLOGY:squamous cell carcinoma  Urinalysis    Component Value Date/Time   COLORURINE YELLOW 02/09/2014 0033   APPEARANCEUR CLEAR 02/09/2014 0033   LABSPEC 1.010 02/09/2014 0033   PHURINE 6.5 02/09/2014 0033   GLUCOSEU NEGATIVE 02/09/2014 0033   HGBUR NEGATIVE 02/09/2014 0033   BILIRUBINUR NEGATIVE 02/09/2014 0033   KETONESUR NEGATIVE 02/09/2014 0033   PROTEINUR TRACE* 02/09/2014 0033   UROBILINOGEN 0.2 02/09/2014 0033   NITRITE NEGATIVE 02/09/2014 0033   LEUKOCYTESUR SMALL* 02/09/2014 0033    RADIOGRAPHIC STUDIES: No results found. Wo Contrast    Status: Final result       Study Result     CLINICAL DATA: Restaging left lung cancer.  EXAM: CT CHEST, ABDOMEN AND PELVIS WITHOUT CONTRAST 02/02/2014 TECHNIQUE: Multidetector CT imaging of the chest, abdomen and pelvis was performed following the standard protocol without IV contrast.  COMPARISON: 10/14/2013  FINDINGS: CT CHEST FINDINGS  The previously seen cavitary mass in the left lower lobe has decreased in size, measuring 2.2 x 1.7 cm compared with 3.2 x 2.0 cm previously. This is measured on image 43 of series 8. Increasing paramediastinal and left lower lobe interstitial densities in both lungs, presumably related to radiation. 4 mm nodule in the right lower lobe posteriorly on image 53, not definitively seen on prior study. This is nonspecific. Recommend attention on followup imaging. No pleural effusions.  Moderate centrilobular and paraseptal emphysema. Heart is upper limits normal in size. Scattered coronary artery calcifications most pronounced in the left anterior descending and circumflex arteries. Scattered aortic calcifications without aneurysm. No mediastinal, hilar, or axillary adenopathy.  Right Port-A-Cath remains in place, unchanged. Chest wall soft tissues are unremarkable.  CT ABDOMEN AND PELVIS FINDINGS  Two hepatic hypodensities have enlarged since prior study. The left hepatic lesion measures 15 mm on image 57  compared with 10 mm previously. The right hepatic hypodensity measures 19 mm on image 53 compared with 12 mm previously. No visible new hepatic lesions on this unenhanced study. Spleen, right adrenal, kidneys, pancreas have an unremarkable unenhanced appearance. Left adrenal lesion measures 3.2 x 2.7 cm compared with 3.3 x 3.0 cm previously. The density is approximately 0 Hounsfield units, most compatible with adenoma.  Changes of partial colectomy again noted, stable. Small bowel and stomach are grossly unremarkable. Small  scattered retroperitoneal lymph nodes again noted. Left periaortic lymph node on image 74 measures 7 mm in short axis diameter compared with 10 mm previously. Aortic graft again noted. No ascites, free fluid or free air.  No acute bony abnormality or focal bone lesion.  IMPRESSION: Decreasing size of the left lower lobe mass since prior study. Increasing paramediastinal and left lower lobe interstitial opacities, presumably related to radiation.  4 mm right lower lobe subpleural nodule, not seen on prior study. Recommend attention on followup imaging.  Moderate emphysema.  Slight enlargement of hypodensities within the liver concerning for Metastases.(Liver MRI done on 10/24/2013 described probable hemangiomas)  Stable left adrenal adenoma.   Electronically Signed  By: Rolm Baptise M.D.  On: 08/10/    ASSESSMENT:  #1. Stage III-B squamous cell carcinoma lung, excellent response to combined modality therapy, further improve on most recent CT scan performed on 02/02/2014. #2. Esophagitis, improved.  #3. Chronic obstructive pulmonary disease  #4. Hepatic hemangiomas. #5. Depression on 40 mg Paxil daily. #6. Anemia of chronic disease. #7. Tremulousness with rigidity and unstable gait, possible Parkinson's disease.    PLAN:  #1. Neurology consultation with Dr.[place]. #2. Repeat CTs chest abdomen and pelvis on 05/26/2014 with office visit on 05/28/2014. #3. Continue current medications. #4. Follow-up on 05/28/2014 with CBC, chem profile, LDH   All questions were answered. The patient knows to call the clinic with any problems, questions or concerns. We can certainly see the patient much sooner if necessary.   I spent 25 minutes counseling the patient face to face. The total time spent in the appointment was 30 minutes.    Doroteo Bradford, MD 04/30/2014 2:26 PM  DISCLAIMER:  This note was dictated with voice recognition software.  Similar sounding words can  inadvertently be transcribed inaccurately and may not be corrected upon review.

## 2014-04-30 NOTE — Patient Instructions (Signed)
[facility] Discharge Instructions  RECOMMENDATIONS MADE BY THE CONSULTANT AND ANY TEST RESULTS WILL BE SENT TO YOUR REFERRING PHYSICIAN.  EXAM FINDINGS BY THE PHYSICIAN TODAY AND SIGNS OR SYMPTOMS TO REPORT TO CLINIC OR PRIMARY PHYSICIAN: You saw Dr Barnet Glasgow today  MEDICATIONS PRESCRIBED:  No new medications.  Paxil (paroxetine) does not cause tremors  INSTRUCTIONS GIVEN AND DISCUSSED: We will set up CT scans in December  SPECIAL INSTRUCTIONS/FOLLOW-UP: Follow up with the doctor after your CT scan  Thank you for choosing [facility] to provide your oncology and hematology care.  To afford each patient quality time with our providers, please arrive at least 15 minutes before your scheduled appointment time.  With your help, our goal is to use those 15 minutes to complete the necessary work-up to ensure our physicians have the information they need to help with your evaluation and healthcare recommendations.    Effective January 1st, 2014, we ask that you re-schedule your appointment with our physicians should you arrive 10 or more minutes late for your appointment.  We strive to give you quality time with our providers, and arriving late affects you and other patients whose appointments are after yours.    Again, thank you for choosing [facility].  Our hope is that these requests will decrease the amount of time that you wait before being seen by our physicians.       _____________________________________________________________  Should you have questions after your visit to [facility], please contact our office at [phone] between the hours of 8:30 a.m. and 5:00 p.m.  Voicemails left after 4:30 p.m. will not be returned until the following business day.  For prescription refill requests, have your pharmacy contact our office with your prescription refill request.

## 2014-04-30 NOTE — Progress Notes (Signed)
[name] presented for labwork. Labs per MD order drawn via Peripheral Line 23 gauge needle inserted in left AC  Good blood return present. Procedure without incident.  Needle removed intact. Patient tolerated procedure well.

## 2014-05-01 ENCOUNTER — Telehealth: Payer: Self-pay | Admitting: Family Medicine

## 2014-05-01 ENCOUNTER — Encounter (HOSPITAL_COMMUNITY): Payer: Self-pay | Admitting: Lab

## 2014-05-01 DIAGNOSIS — R251 Tremor, unspecified: Secondary | ICD-10-CM

## 2014-05-01 DIAGNOSIS — R2681 Unsteadiness on feet: Secondary | ICD-10-CM

## 2014-05-01 NOTE — Telephone Encounter (Signed)
Order for referral put in.  

## 2014-05-01 NOTE — Telephone Encounter (Signed)
Ok lets 

## 2014-05-01 NOTE — Progress Notes (Signed)
Referral sent to Dr Merlene Laughter on 11/6.  Records faxed also.  Patient has appointment at their office at Dec 10 @215 .

## 2014-05-01 NOTE — Telephone Encounter (Signed)
[facility] called today regarding a referral that is needed for [name].  Due to patients insurance, a referral from the PCP is required for her to be referred anywhere.  [facility] is requesting a referral to Dr Merlene Laughter for possible parkinsons disease.  She has an appointment with their office on 06/04/14.

## 2014-05-19 ENCOUNTER — Encounter (HOSPITAL_COMMUNITY): Payer: Medicare Other

## 2014-05-25 NOTE — Progress Notes (Signed)
Leah Battiest, MD [place] [address]  Squamous cell carcinoma of left lung - Plan: fenofibrate 160 MG tablet, heparin lock flush 100 unit/mL, sodium chloride 0.9 % injection 10 mL, CBC with Differential, Basic metabolic panel, Magnesium, CT Abdomen Pelvis Wo Contrast, CT Chest Wo Contrast, CBC with Differential, Basic metabolic panel, Magnesium, CBC with Differential, Comprehensive metabolic panel, DISCONTINUED: predniSONE (DELTASONE) 20 MG tablet  Loose stools - Plan: diphenoxylate-atropine (LOMOTIL) 2.5-0.025 MG per tablet  Failure to thrive in adult - Plan: predniSONE (DELTASONE) 5 MG tablet  CURRENT THERAPY: Surveillance  INTERVAL HISTORY: [name] 79 y.o. 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     Squamous cell carcinoma of left lung   06/30/2013 Imaging CT of chest demonstrates 4 cm left lower lobe lesion with other concerning findings for malignancy   07/09/2013 Imaging PET- 4.1 cm left lower oobe lesion with left hilar and bilateral mediastinal lymphadeopathy.  Hepatic lesion not confidently identified.  1.8 cm nodule in left upper lobe, cannot exclude low grade tumor.   07/16/2013 Initial Diagnosis Squamous cell carcinoma of left lung via CT-guided biopsy by IR   07/29/2013 Surgery IR placement of port-a-cath in preparation for concomittant chemoradiation    08/08/2013 - 09/12/2013 Chemotherapy Paclitaxel/Carboplatin weekly x 6   08/08/2013 - 09/19/2013 Radiation Therapy    10/14/2013 Imaging CT CAP- 3.2 cm posterior left lower lobe cavitary mass, corresponding to known primary bronchogenic neoplasm, improved. Small mediastinal nodes measuring up to  8 mm. Two hypodense hepatic lesions measuring up to 12 mm, poorly evaluated.   10/24/2013 Imaging MRI abd- Two liver lesions in the superior right hepatic lobe and left hepatic lobe remain indeterminate technically, but favor small benign hemangiomas over metastatic lesions.   02/02/2014 Imaging CT CAP- Decreasing size of the left lower lobe mass since prior study. 4 mm right lower lobe subpleural nodule, not seen on prior study.  Slight enlargement of hypodensities within the liver concerning for metastases.   05/26/2014 Imaging CT CAP- Continued evolution of postradiation treatment related changes in the left lung without definite signs of residual or recurrent disease, and no definite evidence of metastatic disease in the chest, abdomen or pelvis on today's examination   I personally reviewed and went over laboratory results with the patient.  The results are noted within this dictation.  I personally reviewed and went over radiographic studies with the patient.  The results are noted within this dictation.  CT CAP on 05/26/2014 demonstrates continued remission: 1. Continued evolution of postradiation treatment related changes in the left lung without definite signs of residual or recurrent disease, and no definite evidence of metastatic disease in the chest, abdomen or pelvis on today's examination. 2. Multiple new healing nondisplaced fractures of the L1-L4 transverse processes, presumably related to the patient's recent history of falls. 3. Unchanged size of hepatic lesions, as above, previously characterized by MRI as probable hemangiomas. 4. Additional incidental findings, as above, similar prior examinations.  She has a number of complaints including the following: 1. Diarrhea- she reports 5 loose stools per day.  She is taking Imodium without much benefit.  She is on Magnesium and I will check a Magnesium level and discontinue this medication; as this may  be the cause of her loose stools. I  will provide an Rx for Lomotil for this. 2. Her daughter restarted [name] back on her prednisone that used in the past in an attempt to aid in her appetite, energy, and QOL. Therefore, I will provide an Rx for Prednisone 5 mg BID with meals.   She fell in the bathroom in the past and injured her left hip.  She denies any groin pain.  The is not any obvious abnormality on exam and CT scan did no illustrate any fractures of hip.  She also injured her left wrist and there is an ecchymosis of the left medial area of forearm superior to wrist.  This is a resolving ecchymosis.  Otherwise, oncologically, she denies any complaints and ROS questioning is negative.   Past Medical History  Diagnosis Date  . Hypertension   . Hypothyroidism   . Hyperlipidemia   . Insomnia   . GERD (gastroesophageal reflux disease)     chronic gastritis  . Adrenal adenoma   . Chronic diarrhea   . Fatty liver   . Arthritis     knee  . QT prolongation     syncope  . Coronary artery disease   . Impaired fasting glucose   . Renal insufficiency     low protien diet  . Tobacco use     1/2 ppd, approx 25-50 pack years (as of 08/2012)  . Family history of heart disease   . IBS (irritable bowel syndrome)   . Peripheral arterial disease     sstatus post  infrarenal abdominal aortic tube graft placed by Dr. Victorino Dike February 2008  . Cancer     Lung  . Squamous cell carcinoma of left lung 07/29/2013    has Unspecified hypothyroidism; Pruritic dermatitis; Chronic renal insufficiency; Generalized anxiety disorder; Insomnia; Esophageal reflux; Coronary artery disease; Essential hypertension; Hyperlipidemia; Peripheral arterial disease; Anemia, iron deficiency; Squamous cell carcinoma of left lung; COPD, moderate; and Cholelithiasis on her problem list.     is allergic to aleve; dilaudid; dyazide; and zithromax.  We administered heparin lock flush and sodium chloride.  Past Surgical History  Procedure Laterality Date  .  Abdominal hysterectomy    . Thyroidectomy, partial    . Colonoscopy  5/04    normal  . Abdominal aortic aneurysm repair  07/2006    D. J.D. Kellie Simmering  . Cataract extraction Bilateral 2010  . Cardiac catheterization  02/2010    mod CAD in L-dominant system with normal LV function  . Colon resection  2005    1/2 colon removed  . Transthoracic echocardiogram  02/2010    EF 55-60%; mild conc LVH, normal systolic function; mildly calcified AV annulus  . Colonoscopy with esophagogastroduodenoscopy (egd) N/A 03/19/2013    Procedure: COLONOSCOPY WITH ESOPHAGOGASTRODUODENOSCOPY (EGD);  Surgeon: Rogene Houston, MD;  Location: [facility];  Service: Endoscopy;  Laterality: N/A;  145  . Dg biopsy lung Left Jan 2015    Denies any headaches, double vision, fevers, chills, night sweats, nausea, vomiting, constipation, chest pain, heart palpitations, shortness of breath, blood in stool, black tarry stool, urinary pain, urinary burning, urinary frequency, hematuria.   PHYSICAL EXAMINATION  ECOG PERFORMANCE STATUS: 1 - Symptomatic but completely ambulatory  Filed Vitals:   05/28/14 1407  BP: 120/66  Pulse: 84  Temp: 98.7 F (37.1 C)  Resp: 18    GENERAL:alert, no distress, comfortable and cooperative SKIN: skin color, texture, turgor are normal, no rashes or significant lesions HEAD:  Normocephalic, No masses, lesions, tenderness or abnormalities EYES: normal, PERRLA, EOMI, Conjunctiva are pink and non-injected EARS: External ears normal OROPHARYNX:mucous membranes are moist  NECK: supple, no adenopathy, thyroid normal size, non-tender, without nodularity, no stridor, non-tender, trachea midline LYMPH:  no palpable lymphadenopathy BREAST:not examined LUNGS: clear to auscultation  HEART: regular rate & rhythm, no murmurs, no gallops, S1 normal and S2 normal ABDOMEN:abdomen soft, non-tender and normal bowel sounds BACK: Back symmetric, no curvature. EXTREMITIES:less then 2 second capillary  refill, no joint deformities, effusion, or inflammation, no skin discoloration, no clubbing, no cyanosis, left superior to wrist resolving ecchymosis on the medial aspect NEURO: alert & oriented x 3 with fluent speech, no focal motor/sensory deficits, blunted affect, ambulating    LABORATORY DATA: CBC    Component Value Date/Time   WBC 5.1 04/30/2014 1246   RBC 3.38* 04/30/2014 1246   HGB 11.1* 04/30/2014 1246   HCT 34.0* 04/30/2014 1246   PLT 247 04/30/2014 1246   MCV 100.6* 04/30/2014 1246   MCH 32.8 04/30/2014 1246   MCHC 32.6 04/30/2014 1246   RDW 15.2 04/30/2014 1246   LYMPHSABS 0.6* 04/30/2014 1246   MONOABS 0.4 04/30/2014 1246   EOSABS 0.1 04/30/2014 1246   BASOSABS 0.0 04/30/2014 1246      Chemistry      Component Value Date/Time   NA 138 04/30/2014 1246   K 3.8 04/30/2014 1246   CL 101 04/30/2014 1246   CO2 22 04/30/2014 1246   BUN 28* 04/30/2014 1246   CREATININE 2.21* 04/30/2014 1246   CREATININE 2.03* 05/13/2013 0941      Component Value Date/Time   CALCIUM 9.6 04/30/2014 1246   ALKPHOS 49 04/30/2014 1246   AST 22 04/30/2014 1246   ALT 11 04/30/2014 1246   BILITOT 0.3 04/30/2014 1246       RADIOGRAPHIC STUDIES:  05/26/2014  CLINICAL DATA: 79 year old female with history of left lower squamous cell carcinoma status post combined chemotherapy and radiation therapy, completed on 09/12/2013. Recent history of multiple false to the ground from a standing position related to tremulousness.  EXAM: CT CHEST, ABDOMEN AND PELVIS WITHOUT CONTRAST  TECHNIQUE: Multidetector CT imaging of the chest, abdomen and pelvis was performed following the standard protocol without IV contrast.  COMPARISON: CT of the chest, abdomen and pelvis 02/02/2014  FINDINGS: CT CHEST FINDINGS  Mediastinum: Right internal jugular single-lumen porta cath with tip terminating in the distal superior vena cava. Heart size is mildly enlarged. There is no significant  pericardial fluid, thickening or pericardial calcification. There is atherosclerosis of the thoracic aorta, the great vessels of the mediastinum and the coronary arteries, including calcified atherosclerotic plaque in the left main, left anterior descending and right coronary arteries. No pathologically enlarged mediastinal or hilar lymph nodes. Please note that accurate exclusion of hilar adenopathy is limited on noncontrast CT scans. Esophagus is unremarkable in appearance.  Lungs/Pleura: Continued evolution of postradiation changes in the lungs, particularly in the paramediastinal aspect of the upper lobes of the lungs bilaterally, and throughout the left lower lobe. In particular, in the basal segments of the left lower lobe the evolving postradiation changes are less mass like in appearance than the prior study, presumably evolving post radiation fibrosis. The previously described nodular density in the left lower lobe continues to decrease in size, currently measuring only 1.2 x 1.8 cm (image 45 of series 3). Although residual disease is difficult to entirely exclude, it is not favored at this time. No new suspicious appearing pulmonary nodules or  masses are noted. Mild diffuse bronchial wall thickening with mild to moderate centrilobular and paraseptal emphysema. No acute consolidative airspace disease. No pleural effusions.  Musculoskeletal: There are no aggressive appearing lytic or blastic lesions noted in the visualized portions of the skeleton.  CT ABDOMEN AND PELVIS FINDINGS  Hepatobiliary: Two low-attenuation liver lesions in segments 4A and 8 are unchanged in size compared to the prior study measuring 1.5 cm and 1.9 x 1.1 cm respectively. No new hepatic lesions are noted. Calcified gallstone lying dependently in the gallbladder. No current findings to suggest acute cholecystitis at this time.  Pancreas: Unremarkable.  Spleen:  Unremarkable.  Adrenals/Urinary Tract: 2.7 x 2.7 cm low-attenuation (-1 Hounsfield unit) left adrenal nodule, similar to prior examinations, compatible with an adenoma. Right adrenal gland is normal in appearance. The unenhanced appearance of the kidneys bilaterally is normal. No hydroureteronephrosis. Urinary bladder is normal in appearance.  Stomach/Bowel: The unenhanced appearance of the stomach is normal. Status post subtotal colectomy. No pathologic dilatation of small bowel or the rib remaining colon/rectum. Numerous surgical clips are noted throughout the peritoneal cavity.  Vascular/Lymphatic: Status post graft repair for the infrarenal abdominal aorta. Extensive atherosclerosis throughout the abdominal and pelvic vasculature, with mild aneurysmal dilatation of the distal left common iliac artery (16 mm in diameter). No lymphadenopathy noted in the abdomen or pelvis on today's non contrast CT examination.  Reproductive: Status post hysterectomy. Ovaries are not confidently identified may be surgically absent or atrophic.  Other: No significant volume of ascites. No pneumoperitoneum.  Musculoskeletal: Multiple new healing nondisplaced fractures of the left transverse processes at L1 through L4. There are no aggressive appearing lytic or blastic lesions noted in the visualized portions of the skeleton.  IMPRESSION: 1. Continued evolution of postradiation treatment related changes in the left lung without definite signs of residual or recurrent disease, and no definite evidence of metastatic disease in the chest, abdomen or pelvis on today's examination. 2. Multiple new healing nondisplaced fractures of the L1-L4 transverse processes, presumably related to the patient's recent history of falls. 3. Unchanged size of hepatic lesions, as above, previously characterized by MRI as probable hemangiomas. 4. Additional incidental findings, as above, similar  prior examinations.   Electronically Signed  By: Vinnie Langton M.D.  On: 05/26/2014 10:34     ASSESSMENT:  1. Stage III-B squamous cell carcinoma lung, excellent response to combined modality therapy, further improve on most recent CT scan performed on 02/02/2014. 2. Esophagitis, improved.  3. Chronic obstructive pulmonary disease  4. Hepatic hemangiomas. 5. Depression on 40 mg Paxil daily. 6. Anemia of chronic disease. 7. Tremulousness with rigidity and unstable gait, possible Parkinson's disease, referred to neurology. 8. Loose stools 9. Failure to thrive  Patient Active Problem List   Diagnosis Date Noted  . Cholelithiasis 11/07/2013  . COPD, moderate 09/22/2013  . Squamous cell carcinoma of left lung 07/29/2013  . Anemia, iron deficiency 03/03/2013  . Coronary artery disease 02/28/2013  . Essential hypertension 02/28/2013  . Hyperlipidemia 02/28/2013  . Peripheral arterial disease 02/28/2013  . Generalized anxiety disorder 12/10/2012  . Insomnia 12/10/2012  . Esophageal reflux 12/10/2012  . Chronic renal insufficiency 11/07/2012  . Unspecified hypothyroidism 09/10/2012  . Pruritic dermatitis 09/10/2012     PLAN:  1. I personally reviewed and went over laboratory results with the patient.  The results are noted within this dictation. 2. I personally reviewed and went over radiographic studies with the patient.  The results are noted within this dictation.   3. Chart  reviewed 4. Labs in 3 months: CBC diff, CMET 5. CT CAP wo contrast in 3 months 6. Rx for Lomotil 1 tablet QID PRN 7. Rx for Prednisone 5 mg BID with meals 8. Recommend continued Tramadol use for pain 9. Return in 3 months for follow-up  THERAPY PLAN:  Her CT scan continues to demonstrate a response to therapy.  We will continue symptomatic support and close surveillance.   All questions were answered. The patient knows to call the clinic with any problems, questions or concerns. We can  certainly see the patient much sooner if necessary.  Patient and plan discussed with Dr. Farrel Gobble and he is in agreement with the aforementioned.   [name] 05/28/2014

## 2014-05-26 ENCOUNTER — Other Ambulatory Visit (HOSPITAL_COMMUNITY): Payer: Self-pay | Admitting: Hematology and Oncology

## 2014-05-26 ENCOUNTER — Ambulatory Visit (HOSPITAL_COMMUNITY)
Admission: RE | Admit: 2014-05-26 | Discharge: 2014-05-26 | Disposition: A | Discharge status: Home / Self Care | Payer: Medicare Other | Source: Ambulatory Visit | Attending: Hematology and Oncology | Admitting: Hematology and Oncology

## 2014-05-26 DIAGNOSIS — C3402 Malignant neoplasm of left main bronchus: Secondary | ICD-10-CM | POA: Diagnosis not present

## 2014-05-26 DIAGNOSIS — Z923 Personal history of irradiation: Secondary | ICD-10-CM | POA: Insufficient documentation

## 2014-05-26 DIAGNOSIS — S32019A Unspecified fracture of first lumbar vertebra, initial encounter for closed fracture: Secondary | ICD-10-CM | POA: Diagnosis not present

## 2014-05-26 DIAGNOSIS — D1803 Hemangioma of intra-abdominal structures: Secondary | ICD-10-CM | POA: Insufficient documentation

## 2014-05-26 DIAGNOSIS — S32039A Unspecified fracture of third lumbar vertebra, initial encounter for closed fracture: Secondary | ICD-10-CM | POA: Insufficient documentation

## 2014-05-26 DIAGNOSIS — S32029A Unspecified fracture of second lumbar vertebra, initial encounter for closed fracture: Secondary | ICD-10-CM | POA: Diagnosis not present

## 2014-05-26 DIAGNOSIS — S32049A Unspecified fracture of fourth lumbar vertebra, initial encounter for closed fracture: Secondary | ICD-10-CM | POA: Insufficient documentation

## 2014-05-26 DIAGNOSIS — Z9181 History of falling: Secondary | ICD-10-CM | POA: Insufficient documentation

## 2014-05-26 DIAGNOSIS — C3432 Malignant neoplasm of lower lobe, left bronchus or lung: Secondary | ICD-10-CM | POA: Diagnosis not present

## 2014-05-26 DIAGNOSIS — Z9221 Personal history of antineoplastic chemotherapy: Secondary | ICD-10-CM | POA: Insufficient documentation

## 2014-05-28 ENCOUNTER — Encounter (HOSPITAL_COMMUNITY): Payer: Medicare Other | Attending: Hematology and Oncology | Admitting: Oncology

## 2014-05-28 ENCOUNTER — Encounter (HOSPITAL_COMMUNITY): Payer: Medicare Other

## 2014-05-28 ENCOUNTER — Encounter (HOSPITAL_COMMUNITY): Payer: Self-pay | Admitting: Oncology

## 2014-05-28 VITALS — BP 120/66 | HR 84 | Temp 98.7°F | Resp 18 | Wt 167.0 lb

## 2014-05-28 DIAGNOSIS — J449 Chronic obstructive pulmonary disease, unspecified: Secondary | ICD-10-CM | POA: Diagnosis not present

## 2014-05-28 DIAGNOSIS — F329 Major depressive disorder, single episode, unspecified: Secondary | ICD-10-CM

## 2014-05-28 DIAGNOSIS — R627 Adult failure to thrive: Secondary | ICD-10-CM | POA: Diagnosis not present

## 2014-05-28 DIAGNOSIS — C3492 Malignant neoplasm of unspecified part of left bronchus or lung: Secondary | ICD-10-CM | POA: Insufficient documentation

## 2014-05-28 DIAGNOSIS — C3432 Malignant neoplasm of lower lobe, left bronchus or lung: Secondary | ICD-10-CM | POA: Diagnosis not present

## 2014-05-28 DIAGNOSIS — D630 Anemia in neoplastic disease: Secondary | ICD-10-CM

## 2014-05-28 DIAGNOSIS — R195 Other fecal abnormalities: Secondary | ICD-10-CM

## 2014-05-28 LAB — BASIC METABOLIC PANEL
Anion gap: 15 (ref 5–15)
BUN: 30 mg/dL — AB (ref 6–23)
CHLORIDE: 98 meq/L (ref 96–112)
CO2: 22 mEq/L (ref 19–32)
Calcium: 9.4 mg/dL (ref 8.4–10.5)
Creatinine, Ser: 2.43 mg/dL — ABNORMAL HIGH (ref 0.50–1.10)
GFR, EST AFRICAN AMERICAN: 21 mL/min — AB (ref >90)
GFR, EST NON AFRICAN AMERICAN: 18 mL/min — AB (ref >90)
GLUCOSE: 144 mg/dL — AB (ref 70–99)
POTASSIUM: 3.7 meq/L (ref 3.7–5.3)
Sodium: 135 mEq/L — ABNORMAL LOW (ref 137–147)

## 2014-05-28 LAB — CBC WITH DIFFERENTIAL/PLATELET
Basophils Absolute: 0 10*3/uL (ref 0.0–0.1)
Basophils Relative: 0 % (ref 0–1)
Eosinophils Absolute: 0.1 10*3/uL (ref 0.0–0.7)
Eosinophils Relative: 1 % (ref 0–5)
HCT: 35.7 % — ABNORMAL LOW (ref 36.0–46.0)
HEMOGLOBIN: 11.8 g/dL — AB (ref 12.0–15.0)
LYMPHS ABS: 0.5 10*3/uL — AB (ref 0.7–4.0)
LYMPHS PCT: 7 % — AB (ref 12–46)
MCH: 33.7 pg (ref 26.0–34.0)
MCHC: 33.1 g/dL (ref 30.0–36.0)
MCV: 102 fL — ABNORMAL HIGH (ref 78.0–100.0)
MONOS PCT: 4 % (ref 3–12)
Monocytes Absolute: 0.3 10*3/uL (ref 0.1–1.0)
NEUTROS ABS: 6.7 10*3/uL (ref 1.7–7.7)
NEUTROS PCT: 88 % — AB (ref 43–77)
Platelets: 346 10*3/uL (ref 150–400)
RBC: 3.5 MIL/uL — AB (ref 3.87–5.11)
RDW: 14.9 % (ref 11.5–15.5)
WBC: 7.5 10*3/uL (ref 4.0–10.5)

## 2014-05-28 LAB — MAGNESIUM: MAGNESIUM: 1.8 mg/dL (ref 1.5–2.5)

## 2014-05-28 MED ORDER — SODIUM CHLORIDE 0.9 % IJ SOLN
10.0000 mL | INTRAMUSCULAR | Status: DC | PRN
  Administered 2014-05-28: 10 mL via INTRAVENOUS
  Filled 2014-05-28: qty 10

## 2014-05-28 MED ORDER — PREDNISONE 5 MG PO TABS
5.0000 mg | ORAL_TABLET | Freq: Two times a day (BID) | ORAL | Status: DC
Start: 2014-05-28 — End: 2014-06-08

## 2014-05-28 MED ORDER — DIPHENOXYLATE-ATROPINE 2.5-0.025 MG PO TABS
1.0000 | ORAL_TABLET | Freq: Four times a day (QID) | ORAL | Status: DC | PRN
Start: 2014-05-28 — End: 2014-07-20

## 2014-05-28 MED ORDER — HEPARIN SOD (PORK) LOCK FLUSH 100 UNIT/ML IV SOLN
500.0000 [IU] | Freq: Once | INTRAVENOUS | Status: AC
  Administered 2014-05-28: 500 [IU] via INTRAVENOUS
  Filled 2014-05-28: qty 5

## 2014-05-28 NOTE — Progress Notes (Signed)
[name] presented for labwork. Labs per MD order drawn via Portacath located in the left chest wall accessed with  H 20 needle. Good blood return present. Procedure without incident.  Needle removed intact. Patient tolerated procedure well.

## 2014-05-28 NOTE — Patient Instructions (Signed)
[place] Discharge Instructions  RECOMMENDATIONS MADE BY THE CONSULTANT AND ANY TEST RESULTS WILL BE SENT TO YOUR REFERRING PHYSICIAN.  Discontinue Magnesium pills. We performed labs today and we will call you tomorrow 12/4 with results.  We reviewed your CT scans and you have a copy of that report.  There is no clear evidence of malignancy in chest, abdomen, or pelvis. I will give you a prescription for:  A. Lomitil 1 tablet up to 4 times per day as needed for loose stools  B. Prednisone 5 mg in AM and afternoon with meals Continue Tramadol for pain control CT scans and lab work in 3 months Return in 3 months for follow-up Follow-up with primary care provider and neurology as directed  Have a Merry Christmas.  Please call the [facility] for questions or concerns regarding your lung cancer.  Thank you for choosing [facility] to provide your oncology and hematology care.  To afford each patient quality time with our providers, please arrive at least 15 minutes before your scheduled appointment time.  With your help, our goal is to use those 15 minutes to complete the necessary work-up to ensure our physicians have the information they need to help with your evaluation and healthcare recommendations.    Effective January 1st, 2014, we ask that you re-schedule your appointment with our physicians should you arrive 10 or more minutes late for your appointment.  We strive to give you quality time with our providers, and arriving late affects you and other patients whose appointments are after yours.    Again, thank you for choosing [facility]  Our hope is that these requests will decrease the amount of time that you wait before being seen by our physicians.       _____________________________________________________________  Should you have questions after your visit to [facility], please contact our office at  [phone] between the hours of 8:30 a.m. and 5:00 p.m.  Voicemails left after 4:30 p.m. will not be returned until the following business day.  For prescription refill requests, have your pharmacy contact our office with your prescription refill request.

## 2014-06-04 DIAGNOSIS — R42 Dizziness and giddiness: Secondary | ICD-10-CM | POA: Diagnosis not present

## 2014-06-04 DIAGNOSIS — G2569 Other tics of organic origin: Secondary | ICD-10-CM | POA: Diagnosis not present

## 2014-06-04 DIAGNOSIS — G250 Essential tremor: Secondary | ICD-10-CM | POA: Diagnosis not present

## 2014-06-04 DIAGNOSIS — R2681 Unsteadiness on feet: Secondary | ICD-10-CM | POA: Diagnosis not present

## 2014-06-08 ENCOUNTER — Other Ambulatory Visit: Payer: Self-pay | Admitting: Family Medicine

## 2014-06-08 ENCOUNTER — Ambulatory Visit (INDEPENDENT_AMBULATORY_CARE_PROVIDER_SITE_OTHER): Payer: Medicare Other | Admitting: Family Medicine

## 2014-06-08 ENCOUNTER — Encounter: Payer: Self-pay | Admitting: Family Medicine

## 2014-06-08 VITALS — BP 122/70 | Ht 67.0 in | Wt 170.0 lb

## 2014-06-08 DIAGNOSIS — I2510 Atherosclerotic heart disease of native coronary artery without angina pectoris: Secondary | ICD-10-CM | POA: Diagnosis not present

## 2014-06-08 DIAGNOSIS — J329 Chronic sinusitis, unspecified: Secondary | ICD-10-CM

## 2014-06-08 DIAGNOSIS — Z79899 Other long term (current) drug therapy: Secondary | ICD-10-CM | POA: Diagnosis not present

## 2014-06-08 DIAGNOSIS — Z23 Encounter for immunization: Secondary | ICD-10-CM | POA: Diagnosis not present

## 2014-06-08 DIAGNOSIS — I10 Essential (primary) hypertension: Secondary | ICD-10-CM | POA: Diagnosis not present

## 2014-06-08 DIAGNOSIS — E785 Hyperlipidemia, unspecified: Secondary | ICD-10-CM | POA: Diagnosis not present

## 2014-06-08 MED ORDER — AMOXICILLIN 500 MG PO CAPS: 500.0000 mg | ORAL_CAPSULE | Freq: Three times a day (TID) | ORAL | Status: DC

## 2014-06-08 NOTE — Progress Notes (Signed)
   Subjective:    Patient ID: [name], female    DOB: 07/06/1934, 79 y.o.   MRN: [id number]  Hyperlipidemia This is a chronic problem. The current episode started more than 1 year ago. Compliance problems include adherence to exercise.    Concerns about cough, runny nose, left ear pain. Started 2 weeks ago.   Patient continues to remain compliant with blood pressure medicine. No obvious side effects. Has cut salt down.  Ongoing stress and anxiety. Patient has long history of this but worse since diagnosis of lung cancer. Continues to see a specialist for this Review of Systems No vomiting no diarrhea no chest pain no headache or back pain ROS otherwise negative    Objective:   Physical Exam Alert moderate malaise HET moderate nasal congestion Left otitis lungs clear heart regular in rhythm ankles without edema      Assessment & Plan:  Impression 1 hypertension good control #2 hyperlipidemia status uncertain #3 acute rhinosinusitis #4 chronic anxiety plan antibiotics prescribed. Appropriate blood work. Further recommendations based results.  Nor injection. Follow-up as scheduled. WSL

## 2014-06-17 ENCOUNTER — Other Ambulatory Visit: Payer: Self-pay | Admitting: Family Medicine

## 2014-06-24 DIAGNOSIS — R2681 Unsteadiness on feet: Secondary | ICD-10-CM | POA: Diagnosis not present

## 2014-06-24 DIAGNOSIS — Z79899 Other long term (current) drug therapy: Secondary | ICD-10-CM | POA: Diagnosis not present

## 2014-06-24 DIAGNOSIS — R413 Other amnesia: Secondary | ICD-10-CM | POA: Diagnosis not present

## 2014-06-24 DIAGNOSIS — G250 Essential tremor: Secondary | ICD-10-CM | POA: Diagnosis not present

## 2014-06-24 DIAGNOSIS — G2569 Other tics of organic origin: Secondary | ICD-10-CM | POA: Diagnosis not present

## 2014-06-24 DIAGNOSIS — E785 Hyperlipidemia, unspecified: Secondary | ICD-10-CM | POA: Diagnosis not present

## 2014-06-24 DIAGNOSIS — R42 Dizziness and giddiness: Secondary | ICD-10-CM | POA: Diagnosis not present

## 2014-06-24 DIAGNOSIS — R569 Unspecified convulsions: Secondary | ICD-10-CM | POA: Diagnosis not present

## 2014-06-25 LAB — HEPATIC FUNCTION PANEL
ALK PHOS: 50 U/L (ref 39–117)
ALT: 15 U/L (ref 0–35)
AST: 28 U/L (ref 0–37)
Albumin: 4.3 g/dL (ref 3.5–5.2)
BILIRUBIN INDIRECT: 0.4 mg/dL (ref 0.2–1.2)
Bilirubin, Direct: 0.1 mg/dL (ref 0.0–0.3)
TOTAL PROTEIN: 6.8 g/dL (ref 6.0–8.3)
Total Bilirubin: 0.5 mg/dL (ref 0.2–1.2)

## 2014-06-25 LAB — BASIC METABOLIC PANEL
BUN: 27 mg/dL — AB (ref 6–23)
CO2: 28 meq/L (ref 19–32)
CREATININE: 2.07 mg/dL — AB (ref 0.50–1.10)
Calcium: 9.7 mg/dL (ref 8.4–10.5)
Chloride: 102 mEq/L (ref 96–112)
GLUCOSE: 100 mg/dL — AB (ref 70–99)
Potassium: 4.5 mEq/L (ref 3.5–5.3)
Sodium: 139 mEq/L (ref 135–145)

## 2014-06-25 LAB — LIPID PANEL
Cholesterol: 232 mg/dL — ABNORMAL HIGH (ref 0–200)
HDL: 110 mg/dL (ref >39)
LDL CALC: 105 mg/dL — AB (ref 0–99)
TRIGLYCERIDES: 85 mg/dL (ref <150)
Total CHOL/HDL Ratio: 2.1 Ratio
VLDL: 17 mg/dL (ref 0–40)

## 2014-06-28 ENCOUNTER — Encounter: Payer: Self-pay | Admitting: Family Medicine

## 2014-07-09 ENCOUNTER — Other Ambulatory Visit (HOSPITAL_COMMUNITY): Payer: Self-pay | Admitting: Oncology

## 2014-07-09 ENCOUNTER — Encounter (HOSPITAL_COMMUNITY): Payer: Medicare Other | Attending: Hematology and Oncology

## 2014-07-09 ENCOUNTER — Encounter (HOSPITAL_COMMUNITY): Payer: Self-pay

## 2014-07-09 VITALS — BP 167/67 | HR 90 | Temp 98.4°F | Resp 18

## 2014-07-09 DIAGNOSIS — C3432 Malignant neoplasm of lower lobe, left bronchus or lung: Secondary | ICD-10-CM | POA: Diagnosis not present

## 2014-07-09 DIAGNOSIS — C3492 Malignant neoplasm of unspecified part of left bronchus or lung: Secondary | ICD-10-CM

## 2014-07-09 DIAGNOSIS — Z452 Encounter for adjustment and management of vascular access device: Secondary | ICD-10-CM

## 2014-07-09 MED ORDER — SODIUM CHLORIDE 0.9 % IJ SOLN
10.0000 mL | INTRAMUSCULAR | Status: DC | PRN
  Administered 2014-07-09: 10 mL via INTRAVENOUS
  Filled 2014-07-09: qty 10

## 2014-07-09 MED ORDER — HEPARIN SOD (PORK) LOCK FLUSH 100 UNIT/ML IV SOLN
500.0000 [IU] | Freq: Once | INTRAVENOUS | Status: AC
  Administered 2014-07-09: 500 [IU] via INTRAVENOUS
  Filled 2014-07-09: qty 5

## 2014-07-09 NOTE — Patient Instructions (Signed)
[place] [facility]  Discharge Instructions:  You had your port flushed today.  Follow up as scheduled.  Call teh clinic if you have any questions or concerns _______________________________________________________________  Thank you for choosing [place] [facility] to provide your oncology and hematology care.  To afford each patient quality time with our providers, please arrive at least 15 minutes before your scheduled appointment.  You need to re-schedule your appointment if you arrive 10 or more minutes late.  We strive to give you quality time with our providers, and arriving late affects you and other patients whose appointments are after yours.  Also, if you no show three or more times for appointments you may be dismissed from the clinic.  Again, thank you for choosing [place] [facility] hope is that these requests will allow you access to exceptional care and in a timely manner. _______________________________________________________________  If you have questions after your visit, please contact our office at [phone] between the hours of 8:30 a.m. and 5:00 p.m. Voicemails left after 4:30 p.m. will not be returned until the following business day. _______________________________________________________________  For prescription refill requests, have your pharmacy contact our office. _______________________________________________________________  Recommendations made by the consultant and any test results will be sent to your referring physician. _______________________________________________________________

## 2014-07-09 NOTE — Progress Notes (Signed)
[name] presented for Portacath access and flush.  Proper placement of portacath confirmed by CXR.  Portacath located right chest wall accessed with  H 20 needle.  Good blood return present. Portacath flushed with 49ml NS and 500U/41ml Heparin and needle removed intact.  Procedure tolerated well and without incident.

## 2014-07-13 ENCOUNTER — Ambulatory Visit (HOSPITAL_COMMUNITY): Payer: Medicare Other | Admitting: Oncology

## 2014-07-14 DIAGNOSIS — G2569 Other tics of organic origin: Secondary | ICD-10-CM | POA: Diagnosis not present

## 2014-07-14 DIAGNOSIS — G250 Essential tremor: Secondary | ICD-10-CM | POA: Diagnosis not present

## 2014-07-14 DIAGNOSIS — R2681 Unsteadiness on feet: Secondary | ICD-10-CM | POA: Diagnosis not present

## 2014-07-14 DIAGNOSIS — R42 Dizziness and giddiness: Secondary | ICD-10-CM | POA: Diagnosis not present

## 2014-07-15 ENCOUNTER — Other Ambulatory Visit: Payer: Self-pay | Admitting: Family Medicine

## 2014-07-16 ENCOUNTER — Other Ambulatory Visit: Payer: Self-pay

## 2014-07-16 ENCOUNTER — Encounter (HOSPITAL_COMMUNITY): Payer: Self-pay

## 2014-07-16 ENCOUNTER — Inpatient Hospital Stay (HOSPITAL_COMMUNITY)
Admission: EM | Admit: 2014-07-16 | Discharge: 2014-07-17 | DRG: 683 | Disposition: A | Discharge status: Home / Self Care | Payer: Medicare Other | Attending: Internal Medicine | Admitting: Internal Medicine

## 2014-07-16 ENCOUNTER — Ambulatory Visit (HOSPITAL_COMMUNITY)
Admission: RE | Admit: 2014-07-16 | Discharge: 2014-07-16 | Disposition: A | Discharge status: Home / Self Care | Payer: Medicare Other | Source: Ambulatory Visit | Attending: Oncology | Admitting: Oncology

## 2014-07-16 ENCOUNTER — Emergency Department (HOSPITAL_COMMUNITY): Payer: Medicare Other

## 2014-07-16 DIAGNOSIS — Z85828 Personal history of other malignant neoplasm of skin: Secondary | ICD-10-CM

## 2014-07-16 DIAGNOSIS — G4700 Insomnia, unspecified: Secondary | ICD-10-CM | POA: Diagnosis present

## 2014-07-16 DIAGNOSIS — N189 Chronic kidney disease, unspecified: Secondary | ICD-10-CM

## 2014-07-16 DIAGNOSIS — M1990 Unspecified osteoarthritis, unspecified site: Secondary | ICD-10-CM | POA: Diagnosis present

## 2014-07-16 DIAGNOSIS — R55 Syncope and collapse: Secondary | ICD-10-CM

## 2014-07-16 DIAGNOSIS — K589 Irritable bowel syndrome without diarrhea: Secondary | ICD-10-CM | POA: Diagnosis present

## 2014-07-16 DIAGNOSIS — I2510 Atherosclerotic heart disease of native coronary artery without angina pectoris: Secondary | ICD-10-CM | POA: Diagnosis present

## 2014-07-16 DIAGNOSIS — I10 Essential (primary) hypertension: Secondary | ICD-10-CM

## 2014-07-16 DIAGNOSIS — Z87891 Personal history of nicotine dependence: Secondary | ICD-10-CM

## 2014-07-16 DIAGNOSIS — C3492 Malignant neoplasm of unspecified part of left bronchus or lung: Secondary | ICD-10-CM

## 2014-07-16 DIAGNOSIS — Z833 Family history of diabetes mellitus: Secondary | ICD-10-CM | POA: Diagnosis not present

## 2014-07-16 DIAGNOSIS — Z792 Long term (current) use of antibiotics: Secondary | ICD-10-CM | POA: Diagnosis not present

## 2014-07-16 DIAGNOSIS — Z86018 Personal history of other benign neoplasm: Secondary | ICD-10-CM

## 2014-07-16 DIAGNOSIS — I959 Hypotension, unspecified: Secondary | ICD-10-CM | POA: Diagnosis present

## 2014-07-16 DIAGNOSIS — K760 Fatty (change of) liver, not elsewhere classified: Secondary | ICD-10-CM | POA: Diagnosis present

## 2014-07-16 DIAGNOSIS — C3490 Malignant neoplasm of unspecified part of unspecified bronchus or lung: Secondary | ICD-10-CM | POA: Diagnosis not present

## 2014-07-16 DIAGNOSIS — K529 Noninfective gastroenteritis and colitis, unspecified: Secondary | ICD-10-CM | POA: Diagnosis present

## 2014-07-16 DIAGNOSIS — Z85118 Personal history of other malignant neoplasm of bronchus and lung: Secondary | ICD-10-CM | POA: Diagnosis not present

## 2014-07-16 DIAGNOSIS — Z79899 Other long term (current) drug therapy: Secondary | ICD-10-CM | POA: Diagnosis not present

## 2014-07-16 DIAGNOSIS — Z8249 Family history of ischemic heart disease and other diseases of the circulatory system: Secondary | ICD-10-CM

## 2014-07-16 DIAGNOSIS — I129 Hypertensive chronic kidney disease with stage 1 through stage 4 chronic kidney disease, or unspecified chronic kidney disease: Secondary | ICD-10-CM | POA: Diagnosis not present

## 2014-07-16 DIAGNOSIS — Z9889 Other specified postprocedural states: Secondary | ICD-10-CM

## 2014-07-16 DIAGNOSIS — N179 Acute kidney failure, unspecified: Principal | ICD-10-CM | POA: Diagnosis present

## 2014-07-16 DIAGNOSIS — I739 Peripheral vascular disease, unspecified: Secondary | ICD-10-CM | POA: Diagnosis not present

## 2014-07-16 DIAGNOSIS — I4581 Long QT syndrome: Secondary | ICD-10-CM | POA: Diagnosis present

## 2014-07-16 DIAGNOSIS — Z7982 Long term (current) use of aspirin: Secondary | ICD-10-CM | POA: Diagnosis not present

## 2014-07-16 DIAGNOSIS — N289 Disorder of kidney and ureter, unspecified: Secondary | ICD-10-CM | POA: Diagnosis not present

## 2014-07-16 DIAGNOSIS — E785 Hyperlipidemia, unspecified: Secondary | ICD-10-CM | POA: Diagnosis not present

## 2014-07-16 DIAGNOSIS — K219 Gastro-esophageal reflux disease without esophagitis: Secondary | ICD-10-CM | POA: Diagnosis not present

## 2014-07-16 DIAGNOSIS — E039 Hypothyroidism, unspecified: Secondary | ICD-10-CM | POA: Diagnosis not present

## 2014-07-16 LAB — CBC WITH DIFFERENTIAL/PLATELET
BASOS PCT: 0 % (ref 0–1)
Basophils Absolute: 0 10*3/uL (ref 0.0–0.1)
Eosinophils Absolute: 0.1 10*3/uL (ref 0.0–0.7)
Eosinophils Relative: 1 % (ref 0–5)
HCT: 30.2 % — ABNORMAL LOW (ref 36.0–46.0)
HEMOGLOBIN: 10 g/dL — AB (ref 12.0–15.0)
LYMPHS ABS: 0.6 10*3/uL — AB (ref 0.7–4.0)
Lymphocytes Relative: 12 % (ref 12–46)
MCH: 33.8 pg (ref 26.0–34.0)
MCHC: 33.1 g/dL (ref 30.0–36.0)
MCV: 102 fL — AB (ref 78.0–100.0)
MONO ABS: 0.5 10*3/uL (ref 0.1–1.0)
Monocytes Relative: 11 % (ref 3–12)
NEUTROS PCT: 76 % (ref 43–77)
Neutro Abs: 3.5 10*3/uL (ref 1.7–7.7)
Platelets: 188 10*3/uL (ref 150–400)
RBC: 2.96 MIL/uL — ABNORMAL LOW (ref 3.87–5.11)
RDW: 14.8 % (ref 11.5–15.5)
WBC: 4.7 10*3/uL (ref 4.0–10.5)

## 2014-07-16 LAB — BASIC METABOLIC PANEL
ANION GAP: 7 (ref 5–15)
BUN: 34 mg/dL — ABNORMAL HIGH (ref 6–23)
CALCIUM: 8.9 mg/dL (ref 8.4–10.5)
CO2: 23 mmol/L (ref 19–32)
Chloride: 108 mEq/L (ref 96–112)
Creatinine, Ser: 2.49 mg/dL — ABNORMAL HIGH (ref 0.50–1.10)
GFR calc non Af Amer: 18 mL/min — ABNORMAL LOW (ref >90)
GFR, EST AFRICAN AMERICAN: 20 mL/min — AB (ref >90)
Glucose, Bld: 93 mg/dL (ref 70–99)
POTASSIUM: 4.1 mmol/L (ref 3.5–5.1)
Sodium: 138 mmol/L (ref 135–145)

## 2014-07-16 LAB — TROPONIN I
TROPONIN I: 0.05 ng/mL — AB (ref <0.031)
Troponin I: 0.05 ng/mL — ABNORMAL HIGH (ref <0.031)

## 2014-07-16 LAB — TSH: TSH: 1.783 u[IU]/mL (ref 0.350–4.500)

## 2014-07-16 MED ORDER — FERROUS SULFATE 325 (65 FE) MG PO TABS
325.0000 mg | ORAL_TABLET | Freq: Two times a day (BID) | ORAL | Status: DC
Start: 2014-07-17 — End: 2014-07-17
  Administered 2014-07-17: 325 mg via ORAL
  Filled 2014-07-16 (×4): qty 1

## 2014-07-16 MED ORDER — ASPIRIN EC 81 MG PO TBEC
81.0000 mg | DELAYED_RELEASE_TABLET | Freq: Every day | ORAL | Status: DC
  Administered 2014-07-16 – 2014-07-17 (×2): 81 mg via ORAL
  Filled 2014-07-16 (×2): qty 1

## 2014-07-16 MED ORDER — ALPRAZOLAM 1 MG PO TABS
1.0000 mg | ORAL_TABLET | Freq: Three times a day (TID) | ORAL | Status: DC | PRN
  Administered 2014-07-16: 1 mg via ORAL
  Filled 2014-07-16: qty 1

## 2014-07-16 MED ORDER — ASPIRIN EC 81 MG PO TBEC: 81.0000 mg | DELAYED_RELEASE_TABLET | Freq: Every day | ORAL | Status: DC

## 2014-07-16 MED ORDER — SODIUM CHLORIDE 0.9 % IV SOLN
INTRAVENOUS | Status: DC
  Administered 2014-07-16: 22:00:00 via INTRAVENOUS

## 2014-07-16 MED ORDER — PAROXETINE HCL 20 MG PO TABS
40.0000 mg | ORAL_TABLET | Freq: Every day | ORAL | Status: DC
  Administered 2014-07-17: 40 mg via ORAL
  Filled 2014-07-16 (×3): qty 2

## 2014-07-16 MED ORDER — TEMAZEPAM 15 MG PO CAPS
30.0000 mg | ORAL_CAPSULE | Freq: Every evening | ORAL | Status: DC | PRN
  Administered 2014-07-16: 30 mg via ORAL
  Filled 2014-07-16: qty 2

## 2014-07-16 MED ORDER — SODIUM CHLORIDE 0.9 % IJ SOLN: 3.0000 mL | Freq: Two times a day (BID) | INTRAMUSCULAR | Status: DC

## 2014-07-16 MED ORDER — SODIUM CHLORIDE 0.9 % IV SOLN
1000.0000 mL | Freq: Once | INTRAVENOUS | Status: AC
  Administered 2014-07-16: 1000 mL via INTRAVENOUS

## 2014-07-16 MED ORDER — FENOFIBRATE 160 MG PO TABS
160.0000 mg | ORAL_TABLET | Freq: Every day | ORAL | Status: DC
  Administered 2014-07-17: 160 mg via ORAL
  Filled 2014-07-16 (×3): qty 1

## 2014-07-16 MED ORDER — LEVOTHYROXINE SODIUM 25 MCG PO TABS
137.0000 ug | ORAL_TABLET | Freq: Every day | ORAL | Status: DC
  Administered 2014-07-17: 137 ug via ORAL
  Filled 2014-07-16 (×4): qty 1

## 2014-07-16 MED ORDER — AMLODIPINE BESYLATE 5 MG PO TABS
5.0000 mg | ORAL_TABLET | Freq: Every day | ORAL | Status: DC
  Administered 2014-07-17: 5 mg via ORAL
  Filled 2014-07-16 (×2): qty 1

## 2014-07-16 MED ORDER — PRAVASTATIN SODIUM 10 MG PO TABS
20.0000 mg | ORAL_TABLET | Freq: Every day | ORAL | Status: DC
  Filled 2014-07-16: qty 1

## 2014-07-16 MED ORDER — ONDANSETRON HCL 4 MG/2ML IJ SOLN: 4.0000 mg | Freq: Four times a day (QID) | INTRAMUSCULAR | Status: DC | PRN

## 2014-07-16 MED ORDER — SODIUM CHLORIDE 0.9 % IV SOLN: INTRAVENOUS | Status: DC

## 2014-07-16 MED ORDER — POTASSIUM CHLORIDE CRYS ER 10 MEQ PO TBCR
10.0000 meq | EXTENDED_RELEASE_TABLET | Freq: Every day | ORAL | Status: DC
  Administered 2014-07-17: 10 meq via ORAL
  Filled 2014-07-16 (×5): qty 1

## 2014-07-16 MED ORDER — PANTOPRAZOLE SODIUM 40 MG PO TBEC: 40.0000 mg | DELAYED_RELEASE_TABLET | Freq: Every day | ORAL | Status: DC

## 2014-07-16 MED ORDER — ENALAPRIL MALEATE 5 MG PO TABS
10.0000 mg | ORAL_TABLET | Freq: Every day | ORAL | Status: DC
  Administered 2014-07-17: 10 mg via ORAL
  Filled 2014-07-16 (×2): qty 1
  Filled 2014-07-16 (×2): qty 2
  Filled 2014-07-16: qty 1

## 2014-07-16 MED ORDER — ENALAPRIL MALEATE 20 MG PO TABS: 20.0000 mg | ORAL_TABLET | Freq: Every day | ORAL | Status: DC

## 2014-07-16 MED ORDER — PANTOPRAZOLE SODIUM 40 MG PO TBEC
80.0000 mg | DELAYED_RELEASE_TABLET | Freq: Every day | ORAL | Status: DC
  Administered 2014-07-17: 80 mg via ORAL
  Filled 2014-07-16: qty 2

## 2014-07-16 MED ORDER — MAGNESIUM OXIDE 400 (241.3 MG) MG PO TABS
400.0000 mg | ORAL_TABLET | Freq: Every day | ORAL | Status: DC
  Administered 2014-07-17: 400 mg via ORAL
  Filled 2014-07-16 (×3): qty 1

## 2014-07-16 MED ORDER — SODIUM CHLORIDE 0.9 % IV BOLUS (SEPSIS): 1000.0000 mL | Freq: Once | INTRAVENOUS | Status: DC

## 2014-07-16 MED ORDER — HYDRALAZINE HCL 25 MG PO TABS
25.0000 mg | ORAL_TABLET | Freq: Three times a day (TID) | ORAL | Status: DC
  Administered 2014-07-17: 25 mg via ORAL
  Filled 2014-07-16 (×6): qty 1

## 2014-07-16 MED ORDER — ENOXAPARIN SODIUM 30 MG/0.3ML ~~LOC~~ SOLN
30.0000 mg | SUBCUTANEOUS | Status: DC
  Administered 2014-07-16: 30 mg via SUBCUTANEOUS
  Filled 2014-07-16: qty 0.3

## 2014-07-16 MED ORDER — SODIUM CHLORIDE 0.9 % IV BOLUS (SEPSIS)
500.0000 mL | Freq: Once | INTRAVENOUS | Status: AC
  Administered 2014-07-16: 500 mL via INTRAVENOUS

## 2014-07-16 MED ORDER — ONDANSETRON HCL 4 MG PO TABS: 4.0000 mg | ORAL_TABLET | Freq: Four times a day (QID) | ORAL | Status: DC | PRN

## 2014-07-16 MED ORDER — ALLOPURINOL 300 MG PO TABS
300.0000 mg | ORAL_TABLET | Freq: Every day | ORAL | Status: DC
  Administered 2014-07-17: 300 mg via ORAL
  Filled 2014-07-16 (×3): qty 1

## 2014-07-16 MED ORDER — ENOXAPARIN SODIUM 30 MG/0.3ML ~~LOC~~ SOLN: 30.0000 mg | SUBCUTANEOUS | Status: DC

## 2014-07-16 NOTE — Progress Notes (Signed)
Called ED and received report from [facility].

## 2014-07-16 NOTE — ED Notes (Signed)
Pt reports was here for MRI of head and became dizzy and almost passed out in radiology.  Pt wheeled back to a room and almost passed out again after standing to get onto stretcher.  Denies pain.  C/O feeling dizzy and Sob today.

## 2014-07-16 NOTE — ED Notes (Signed)
Attempted to call report to the floor, spoke with [facility], states she would call back.

## 2014-07-16 NOTE — ED Provider Notes (Signed)
CSN: [id number]     Arrival date & time 07/16/14  1427 History  This chart was scribed for Nat Christen, MD by Edison Simon, ED Scribe. This patient was seen in room APA04/APA04 and the patient's care was started at 2:52 PM.    Chief Complaint  Patient presents with  . Near Syncope   The history is provided by the patient and a relative. No language interpreter was used.    HPI Comments: [name] is a 80 y.o. female recently treated for lung cancer and with history of HTN who presents to the Emergency Department complaining of near syncope just PTA. Per daughter, she was in the the waiting room to get an MRI of her head; when she was called she stood and took a step but then collapsed. Daughter states they caught her before she hit the floor and states she did not lose consciousness. Daughter states she had another similar episode at this ED while trying to ambulate to her bed. She states she feels generally weak and lightheaded at this time. She states she ate a biscuit and drank some water this morning. Daughter notes that on orthostatic blood pressure measurements at PCP recently, her blood pressure was higher while standing. Daughter states she is taking multiple blood pressure medications. She denies confusion.  PCP: [name], MD  Past Medical History  Diagnosis Date  . Hypertension   . Hypothyroidism   . Hyperlipidemia   . Insomnia   . GERD (gastroesophageal reflux disease)     chronic gastritis  . Adrenal adenoma   . Chronic diarrhea   . Fatty liver   . Arthritis     knee  . QT prolongation     syncope  . Coronary artery disease   . Impaired fasting glucose   . Renal insufficiency     low protien diet  . Tobacco use     1/2 ppd, approx 25-50 pack years (as of 08/2012)  . Family history of heart disease   . IBS (irritable bowel syndrome)   . Peripheral arterial disease     sstatus post  infrarenal abdominal aortic tube graft placed by Dr. Victorino Dike February 2008  .  Cancer     Lung  . Squamous cell carcinoma of left lung 07/29/2013   Past Surgical History  Procedure Laterality Date  . Abdominal hysterectomy    . Thyroidectomy, partial    . Colonoscopy  5/04    normal  . Abdominal aortic aneurysm repair  07/2006    D. J.D. Kellie Simmering  . Cataract extraction Bilateral 2010  . Cardiac catheterization  02/2010    mod CAD in L-dominant system with normal LV function  . Colon resection  2005    1/2 colon removed  . Transthoracic echocardiogram  02/2010    EF 55-60%; mild conc LVH, normal systolic function; mildly calcified AV annulus  . Colonoscopy with esophagogastroduodenoscopy (egd) N/A 03/19/2013    Procedure: COLONOSCOPY WITH ESOPHAGOGASTRODUODENOSCOPY (EGD);  Surgeon: Rogene Houston, MD;  Location: [facility];  Service: Endoscopy;  Laterality: N/A;  145  . Dg biopsy lung Left Jan 2015   Family History  Problem Relation Age of Onset  . Hypertension Mother   . Diabetes Mother   . Heart attack Mother   . Hyperlipidemia Sister     x3 sister  . Kidney disease Brother    History  Substance Use Topics  . Smoking status: Former Smoker -- 0.50 packs/day for 59 years  Types: Cigarettes  . Smokeless tobacco: Never Used     Comment: smoking since age [age]  . Alcohol Use: No   OB History    No data available     Review of Systems A complete 10 system review of systems was obtained and all systems are negative except as noted in the HPI and PMH.    Allergies  Aleve; Dilaudid; Dyazide; and Zithromax  Home Medications   Prior to Admission medications   Medication Sig Start Date End Date Taking? Authorizing Provider  allopurinol (ZYLOPRIM) 300 MG tablet take 1 tablet by mouth once daily 02/25/14   Mikey Kirschner, MD  ALPRAZolam Duanne Moron) 1 MG tablet Take 1 mg by mouth 3 (three) times daily as needed for anxiety.    Historical Provider, MD  amLODipine (NORVASC) 5 MG tablet Take 1 tablet (5 mg total) by mouth daily. 03/10/14   Mikey Kirschner, MD   amoxicillin (AMOXIL) 500 MG capsule Take 1 capsule (500 mg total) by mouth 3 (three) times daily. 06/08/14   Mikey Kirschner, MD  aspirin EC 81 MG tablet Take 81 mg by mouth daily.    Historical Provider, MD  diphenoxylate-atropine (LOMOTIL) 2.5-0.025 MG per tablet Take 1 tablet by mouth 4 (four) times daily as needed for diarrhea or loose stools. 05/28/14   Baird Cancer, PA-C  enalapril (VASOTEC) 20 MG tablet Take 20 mg by mouth daily.    Historical Provider, MD  enalapril (VASOTEC) 20 MG tablet take 1 tablet by mouth once daily 07/15/14   Mikey Kirschner, MD  esomeprazole (NEXIUM) 40 MG capsule take 1 capsule by mouth once daily 06/17/14   Mikey Kirschner, MD  fenofibrate 160 MG tablet Take 1 tablet by mouth daily. 04/28/14   Historical Provider, MD  ferrous sulfate (FERROUSUL) 325 (65 FE) MG tablet Take 1 tablet (325 mg total) by mouth 2 (two) times daily after a meal. 03/19/13   Rogene Houston, MD  furosemide (LASIX) 20 MG tablet take 1 tablet by mouth once daily 06/09/14   Mikey Kirschner, MD  hydrALAZINE (APRESOLINE) 25 MG tablet take 1 tablet by mouth three times a day 03/27/14   Mikey Kirschner, MD  levothyroxine (SYNTHROID, LEVOTHROID) 137 MCG tablet take 1 tablet by mouth once daily 04/14/14   Mikey Kirschner, MD  magnesium oxide (MAG-OX) 400 MG tablet Take 500 mg by mouth daily.     Historical Provider, MD  PARoxetine (PAXIL) 40 MG tablet Take 1 tablet (40 mg total) by mouth daily. 01/20/14   Mikey Kirschner, MD  potassium chloride 20 MEQ TBCR Take 10 mEq by mouth daily. 12/15/13   Farrel Gobble, MD  pravastatin (PRAVACHOL) 20 MG tablet take 1 tablet by mouth once daily 04/14/14   Mikey Kirschner, MD  temazepam (RESTORIL) 30 MG capsule Take 30 mg by mouth at bedtime as needed for sleep.    Historical Provider, MD   BP 101/43 mmHg  Pulse 67  Temp(Src) 98.2 F (36.8 C) (Oral)  Resp 16  Ht 5\' 7"  (1.702 m)  Wt 178 lb (80.74 kg)  BMI 27.87 kg/m2  SpO2 95% Physical Exam   Constitutional: She is oriented to person, place, and time. She appears well-developed and well-nourished.  Sluggish but alert  HENT:  Head: Normocephalic and atraumatic.  Eyes: Conjunctivae and EOM are normal. Pupils are equal, round, and reactive to light.  Neck: Normal range of motion. Neck supple.  Cardiovascular: Normal rate and regular rhythm.  Pulmonary/Chest: Effort normal and breath sounds normal.  Abdominal: Soft. Bowel sounds are normal.  Musculoskeletal: Normal range of motion.  Neurological: She is alert and oriented to person, place, and time.  Moving all extremities to command, but not vigorously  Skin: Skin is warm and dry.  Psychiatric: She has a normal mood and affect. Her behavior is normal.  Nursing note and vitals reviewed.   ED Course  Procedures (including critical care time)  DIAGNOSTIC STUDIES: Oxygen Saturation is 95% on room air, normal by my interpretation.   Blood pressure measures at 83/48.  COORDINATION OF CARE: 3:01 PM Discussed treatment plan with patient at beside, the patient agrees with the plan and has no further questions at this time.   Labs Review Labs Reviewed  CBC WITH DIFFERENTIAL - Abnormal; Notable for the following:    RBC 2.96 (*)    Hemoglobin 10.0 (*)    HCT 30.2 (*)    MCV 102.0 (*)    Lymphs Abs 0.6 (*)    All other components within normal limits  BASIC METABOLIC PANEL - Abnormal; Notable for the following:    BUN 34 (*)    Creatinine, Ser 2.49 (*)    GFR calc non Af Amer 18 (*)    GFR calc Af Amer 20 (*)    All other components within normal limits  TROPONIN I - Abnormal; Notable for the following:    Troponin I 0.05 (*)    All other components within normal limits  URINALYSIS, ROUTINE W REFLEX MICROSCOPIC    Imaging Review Ct Head Wo Contrast  07/16/2014   CLINICAL DATA:  80 year old female recently treated for lung cancer with history of high blood pressure presenting with near syncope. Did not hit  head or lose consciousness. Initial encounter.  EXAM: CT HEAD WITHOUT CONTRAST  TECHNIQUE: Contiguous axial images were obtained from the base of the skull through the vertex without intravenous contrast.  COMPARISON:  09/22/2013.  FINDINGS: No intracranial hemorrhage.  No CT evidence of large acute infarct.  Global atrophy without hydrocephalus.  No evidence of intracranial metastatic disease noted on the present unenhanced head CT. Patient was to have MR scan at which time syncopal episode occurred. MR with contrast would prove more sensitive for detection of intracranial metastatic disease.  Mastoid air cells and middle ear cavities as well as paranasal sinuses are clear. Vascular calcifications.  IMPRESSION: No intracranial hemorrhage.  No CT evidence of large acute infarct.  Global atrophy without hydrocephalus.  No evidence of intracranial metastatic disease noted on the present unenhanced head CT. Patient was to have MR scan at which time syncopal episode occurred. MR with contrast would prove more sensitive for detection of intracranial metastatic disease.   Electronically Signed   By: Chauncey Cruel M.D.   On: 07/16/2014 15:54     EKG Interpretation None      Date: 07/16/2014  Rate: 70  Rhythm: normal sinus rhythm  QRS Axis: right  Intervals: normal  ST/T Wave abnormalities: normal  Conduction Disutrbances: none  Narrative Interpretation: unremarkable    MDM   Final diagnoses:  Syncope  Hypotension, unspecified hypotension type    Patient was syncopal and hypotensive in the radiology department while awaiting her MRI scan of the brain. CT head negative from the emergency department. She is currently on 4 blood pressure medications. These will need to be reevaluated. Discussed with Dr. Anastasio Champion.  Admit.    Nat Christen, MD 07/16/14 [phone]

## 2014-07-16 NOTE — ED Notes (Signed)
Pt attempted to use bedpan.  When moving around in bed,  Pt felt like she was going to pass out.  Dr. Lacinda Axon notified.  Orders for another bolus.  B/p 94/48,  Rate 68

## 2014-07-16 NOTE — H&P (Signed)
[facility] History and Physical  [name] DQQ:[id number] DOB: 07-26-1934 DOA: 07/16/2014  Referring physician: ER PCP: [name], MD   Chief Complaint: Near-syncope  HPI: [name] is a 80 y.o. female  This is a 80 year old lady who had 3 episodes today of near syncope. She felt lightheaded and dizzy and this lasted for a few seconds each time. She was apparently waiting to get an MRI of her brain and when she stood up she felt lightheaded. She did not lose consciousness. She did fall and hit the floor. She generally feels weak and lightheaded. The patient denies chest pain, palpitations, dyspnea, nausea, vomiting, abdominal pain or cough. She has not been confused. She is hypertensive on several antihypertensive medications. She has chronic kidney disease. She is now being made for further evaluation.   Review of Systems:  Apart from symptoms above, all other systems negative.  Past Medical History  Diagnosis Date  . Hypertension   . Hypothyroidism   . Hyperlipidemia   . Insomnia   . GERD (gastroesophageal reflux disease)     chronic gastritis  . Adrenal adenoma   . Chronic diarrhea   . Fatty liver   . Arthritis     knee  . QT prolongation     syncope  . Coronary artery disease   . Impaired fasting glucose   . Renal insufficiency     low protien diet  . Tobacco use     1/2 ppd, approx 25-50 pack years (as of 08/2012)  . Family history of heart disease   . IBS (irritable bowel syndrome)   . Peripheral arterial disease     sstatus post  infrarenal abdominal aortic tube graft placed by Dr. Victorino Dike February 2008  . Cancer     Lung  . Squamous cell carcinoma of left lung 07/29/2013   Past Surgical History  Procedure Laterality Date  . Abdominal hysterectomy    . Thyroidectomy, partial    . Colonoscopy  5/04    normal  . Abdominal aortic aneurysm repair  07/2006    D. J.D. Kellie Simmering  . Cataract extraction Bilateral 2010  . Cardiac catheterization  02/2010      mod CAD in L-dominant system with normal LV function  . Colon resection  2005    1/2 colon removed  . Transthoracic echocardiogram  02/2010    EF 55-60%; mild conc LVH, normal systolic function; mildly calcified AV annulus  . Colonoscopy with esophagogastroduodenoscopy (egd) N/A 03/19/2013    Procedure: COLONOSCOPY WITH ESOPHAGOGASTRODUODENOSCOPY (EGD);  Surgeon: Rogene Houston, MD;  Location: [facility];  Service: Endoscopy;  Laterality: N/A;  145  . Dg biopsy lung Left Jan 2015   Social History:  reports that she has quit smoking. Her smoking use included Cigarettes. She has a 29.5 pack-year smoking history. She has never used smokeless tobacco. She reports that she does not drink alcohol or use illicit drugs.  Allergies  Allergen Reactions  . Aleve [Naproxen Sodium] Other (See Comments)    GI bleed  . Dilaudid [Hydromorphone Hcl]     Can not tolerate  . Dyazide [Hydrochlorothiazide W-Triamterene] Other (See Comments)    cramps  . Zithromax [Azithromycin] Itching    Family History  Problem Relation Age of Onset  . Hypertension Mother   . Diabetes Mother   . Heart attack Mother   . Hyperlipidemia Sister     x3 sister  . Kidney disease Brother      Prior to Admission medications  Medication Sig Start Date End Date Taking? Authorizing Provider  allopurinol (ZYLOPRIM) 300 MG tablet take 1 tablet by mouth once daily 02/25/14   Mikey Kirschner, MD  ALPRAZolam Duanne Moron) 1 MG tablet Take 1 mg by mouth 3 (three) times daily as needed for anxiety.    Historical Provider, MD  amLODipine (NORVASC) 5 MG tablet Take 1 tablet (5 mg total) by mouth daily. 03/10/14   Mikey Kirschner, MD  amoxicillin (AMOXIL) 500 MG capsule Take 1 capsule (500 mg total) by mouth 3 (three) times daily. 06/08/14   Mikey Kirschner, MD  aspirin EC 81 MG tablet Take 81 mg by mouth daily.    Historical Provider, MD  diphenoxylate-atropine (LOMOTIL) 2.5-0.025 MG per tablet Take 1 tablet by mouth 4 (four) times  daily as needed for diarrhea or loose stools. 05/28/14   Baird Cancer, PA-C  enalapril (VASOTEC) 20 MG tablet Take 20 mg by mouth daily.    Historical Provider, MD  enalapril (VASOTEC) 20 MG tablet take 1 tablet by mouth once daily 07/15/14   Mikey Kirschner, MD  esomeprazole (NEXIUM) 40 MG capsule take 1 capsule by mouth once daily 06/17/14   Mikey Kirschner, MD  fenofibrate 160 MG tablet Take 1 tablet by mouth daily. 04/28/14   Historical Provider, MD  ferrous sulfate (FERROUSUL) 325 (65 FE) MG tablet Take 1 tablet (325 mg total) by mouth 2 (two) times daily after a meal. 03/19/13   Rogene Houston, MD  furosemide (LASIX) 20 MG tablet take 1 tablet by mouth once daily 06/09/14   Mikey Kirschner, MD  hydrALAZINE (APRESOLINE) 25 MG tablet take 1 tablet by mouth three times a day 03/27/14   Mikey Kirschner, MD  levothyroxine (SYNTHROID, LEVOTHROID) 137 MCG tablet take 1 tablet by mouth once daily 04/14/14   Mikey Kirschner, MD  magnesium oxide (MAG-OX) 400 MG tablet Take 500 mg by mouth daily.     Historical Provider, MD  PARoxetine (PAXIL) 40 MG tablet Take 1 tablet (40 mg total) by mouth daily. 01/20/14   Mikey Kirschner, MD  potassium chloride 20 MEQ TBCR Take 10 mEq by mouth daily. 12/15/13   Farrel Gobble, MD  pravastatin (PRAVACHOL) 20 MG tablet take 1 tablet by mouth once daily 04/14/14   Mikey Kirschner, MD  temazepam (RESTORIL) 30 MG capsule Take 30 mg by mouth at bedtime as needed for sleep.    Historical Provider, MD   Physical Exam: Filed Vitals:   07/16/14 1800 07/16/14 1815 07/16/14 1830 07/16/14 1845  BP: 102/42 101/43 103/41 106/48  Pulse: 69 67 73 63  Temp:      TempSrc:      Resp: 17 16 22 18   Height:      Weight:      SpO2: 96% 95% 95% 95%    Wt Readings from Last 3 Encounters:  07/16/14 80.74 kg (178 lb)  06/08/14 77.111 kg (170 lb)  05/28/14 75.751 kg (167 lb)    General:  Appears calm and comfortable. She looks somewhat clinically dehydrated. Her blood  pressure is soft. Eyes: PERRL, normal lids, irises & conjunctiva ENT: grossly normal hearing, lips & tongue Neck: no LAD, masses or thyromegaly Cardiovascular: RRR, no m/r/g. No LE edema. Telemetry: SR, no arrhythmias  Respiratory: CTA bilaterally, no w/r/r. Normal respiratory effort. Abdomen: soft, ntnd Skin: no rash or induration seen on limited exam Musculoskeletal: grossly normal tone BUE/BLE Psychiatric: grossly normal mood and affect, speech fluent and appropriate  Neurologic: grossly non-focal.          Labs on Admission:  Basic Metabolic Panel:  Recent Labs Lab 07/16/14 1500  NA 138  K 4.1  CL 108  CO2 23  GLUCOSE 93  BUN 34*  CREATININE 2.49*  CALCIUM 8.9   Liver Function Tests: No results for input(s): AST, ALT, ALKPHOS, BILITOT, PROT, ALBUMIN in the last 168 hours. No results for input(s): LIPASE, AMYLASE in the last 168 hours. No results for input(s): AMMONIA in the last 168 hours. CBC:  Recent Labs Lab 07/16/14 1500  WBC 4.7  NEUTROABS 3.5  HGB 10.0*  HCT 30.2*  MCV 102.0*  PLT 188   Cardiac Enzymes:  Recent Labs Lab 07/16/14 1500  TROPONINI 0.05*    BNP (last 3 results) No results for input(s): PROBNP in the last 8760 hours. CBG: No results for input(s): GLUCAP in the last 168 hours.  Radiological Exams on Admission: Ct Head Wo Contrast  07/16/2014   CLINICAL DATA:  80 year old female recently treated for lung cancer with history of high blood pressure presenting with near syncope. Did not hit head or lose consciousness. Initial encounter.  EXAM: CT HEAD WITHOUT CONTRAST  TECHNIQUE: Contiguous axial images were obtained from the base of the skull through the vertex without intravenous contrast.  COMPARISON:  09/22/2013.  FINDINGS: No intracranial hemorrhage.  No CT evidence of large acute infarct.  Global atrophy without hydrocephalus.  No evidence of intracranial metastatic disease noted on the present unenhanced head CT. Patient  was to have MR scan at which time syncopal episode occurred. MR with contrast would prove more sensitive for detection of intracranial metastatic disease.  Mastoid air cells and middle ear cavities as well as paranasal sinuses are clear. Vascular calcifications.  IMPRESSION: No intracranial hemorrhage.  No CT evidence of large acute infarct.  Global atrophy without hydrocephalus.  No evidence of intracranial metastatic disease noted on the present unenhanced head CT. Patient was to have MR scan at which time syncopal episode occurred. MR with contrast would prove more sensitive for detection of intracranial metastatic disease.   Electronically Signed   By: Chauncey Cruel M.D.   On: 07/16/2014 15:54    EKG: Independently reviewed. Normal sinus rhythm, without any acute ST-T wave changes.  Assessment/Plan   1. Near syncope. This may be related to hypotension in the face of several antihypertensive medications together with a degree of hypovolemia/dehydration. She will be treated with gentle IV fluids. I will reduce the dose of her enalapril. 2. Slightly elevated troponin. We will check serial troponin levels although I do not leave she has had a cardiac ischemic event. 3. Chronic kidney disease. Her creatinine appears to be mostly at baseline, perhaps slightly worse than usual. This may be related to degree of dehydration and IV fluids should be of benefit. 4. Squamous cell carcinoma of the left lung, stable. CT brain scan does not show any acute pathology. She does not appear to have any neurological signs that are new. Further evaluation may be done as an outpatient as previously planned.  Further recommendations will depend on patient's hospital progress.   Code Status: Full code.   DVT Prophylaxis: Lovenox.  Family Communication: I discussed the plan with the patient at the bedside.   Disposition Plan: Home when medically stable.  Time spent: 60 minutes.  Doree Albee [facility] Pager [phone].

## 2014-07-16 NOTE — ED Notes (Addendum)
Pt in no distress.  Good color.  C/o being hungry. Rechecking vitals/

## 2014-07-17 DIAGNOSIS — C3492 Malignant neoplasm of unspecified part of left bronchus or lung: Secondary | ICD-10-CM | POA: Diagnosis not present

## 2014-07-17 DIAGNOSIS — R55 Syncope and collapse: Secondary | ICD-10-CM | POA: Diagnosis not present

## 2014-07-17 DIAGNOSIS — N179 Acute kidney failure, unspecified: Secondary | ICD-10-CM | POA: Diagnosis not present

## 2014-07-17 DIAGNOSIS — I10 Essential (primary) hypertension: Secondary | ICD-10-CM | POA: Diagnosis not present

## 2014-07-17 LAB — TROPONIN I
TROPONIN I: 0.04 ng/mL — AB (ref <0.031)
TROPONIN I: 0.04 ng/mL — AB (ref <0.031)

## 2014-07-17 LAB — COMPREHENSIVE METABOLIC PANEL
ALT: 14 U/L (ref 0–35)
AST: 20 U/L (ref 0–37)
Albumin: 3.1 g/dL — ABNORMAL LOW (ref 3.5–5.2)
Alkaline Phosphatase: 45 U/L (ref 39–117)
Anion gap: 5 (ref 5–15)
BUN: 31 mg/dL — AB (ref 6–23)
CO2: 22 mmol/L (ref 19–32)
Calcium: 8.5 mg/dL (ref 8.4–10.5)
Chloride: 113 mEq/L — ABNORMAL HIGH (ref 96–112)
Creatinine, Ser: 1.99 mg/dL — ABNORMAL HIGH (ref 0.50–1.10)
GFR calc non Af Amer: 23 mL/min — ABNORMAL LOW (ref >90)
GFR, EST AFRICAN AMERICAN: 27 mL/min — AB (ref >90)
Glucose, Bld: 93 mg/dL (ref 70–99)
Potassium: 3.7 mmol/L (ref 3.5–5.1)
Sodium: 140 mmol/L (ref 135–145)
Total Bilirubin: 0.4 mg/dL (ref 0.3–1.2)
Total Protein: 5 g/dL — ABNORMAL LOW (ref 6.0–8.3)

## 2014-07-17 LAB — CBC
HEMATOCRIT: 29.3 % — AB (ref 36.0–46.0)
HEMOGLOBIN: 9.5 g/dL — AB (ref 12.0–15.0)
MCH: 33.6 pg (ref 26.0–34.0)
MCHC: 32.4 g/dL (ref 30.0–36.0)
MCV: 103.5 fL — ABNORMAL HIGH (ref 78.0–100.0)
PLATELETS: 144 10*3/uL — AB (ref 150–400)
RBC: 2.83 MIL/uL — AB (ref 3.87–5.11)
RDW: 14.8 % (ref 11.5–15.5)
WBC: 4 10*3/uL (ref 4.0–10.5)

## 2014-07-17 MED ORDER — HEPARIN SOD (PORK) LOCK FLUSH 100 UNIT/ML IV SOLN
500.0000 [IU] | INTRAVENOUS | Status: DC
  Administered 2014-07-17: 500 [IU]

## 2014-07-17 MED ORDER — HEPARIN SOD (PORK) LOCK FLUSH 100 UNIT/ML IV SOLN: 500.0000 [IU] | INTRAVENOUS | Status: DC | PRN

## 2014-07-17 MED ORDER — HEPARIN SOD (PORK) LOCK FLUSH 100 UNIT/ML IV SOLN
500.0000 [IU] | Freq: Once | INTRAVENOUS | Status: DC
  Filled 2014-07-17: qty 5

## 2014-07-17 MED ORDER — ENALAPRIL MALEATE 10 MG PO TABS: 10.0000 mg | ORAL_TABLET | Freq: Every day | ORAL | Status: DC

## 2014-07-17 NOTE — Plan of Care (Signed)
Problem: Discharge Progression Outcomes Goal: Hemodynamically stable Outcome: Completed/Met Date Met:  07/17/14 Bp normal levels

## 2014-07-17 NOTE — Care Management Utilization Note (Signed)
UR completed 

## 2014-07-17 NOTE — Care Management Note (Addendum)
    Page 1 of 1   07/17/2014     11:18:37 AM CARE MANAGEMENT NOTE 07/17/2014  Patient:  [name], [name]   Account Number:  [id number]  Date Initiated:  07/17/2014  Documentation initiated by:  Jolene Provost  Subjective/Objective Assessment:   Pt is from home, lives alone and is independent with ADL's. Pt has no HH services, DME's or med needs prior to admission. Pt plans to discharge home with self care. No CM needs identified at this time.     Action/Plan:   Anticipated DC Date:  07/18/2014   Anticipated DC Plan:  HOME/SELF CARE         Choice offered to / List presented to:             Status of service:  Completed, signed off Medicare Important Message given?  YES (If response is "NO", the following Medicare IM given date fields will be blank) Date Medicare IM given:  07/17/2014 Medicare IM given by:  Vladimir Creeks Date Additional Medicare IM given:   Additional Medicare IM given by:    Discharge Disposition:  HOME/SELF CARE  Per UR Regulation:    If discussed at Long Length of Stay Meetings, dates discussed:    Comments:  07/17/2014 [place], RN, MSN, [facility]

## 2014-07-17 NOTE — Progress Notes (Signed)
Pt has not voided again since coming 9:53 p.m. NT and Nurse aware the next time pt voids a specimen needs to be collected.

## 2014-07-17 NOTE — Discharge Summary (Signed)
Physician Discharge Summary  [name] DDU:[id number] DOB: 08/20/1934 DOA: 07/16/2014  PCP: [name], MD  Admit date: 07/16/2014 Discharge date: 07/17/2014  Time spent: 25 minutes  Recommendations for Outpatient Follow-up:  1. Follow up with PCP in 1-2 weeks  Discharge Diagnoses:  Active Problems:   Chronic renal insufficiency   Essential hypertension   Squamous cell carcinoma of left lung   Near syncope   Syncope  Discharge Condition: Stable  Diet recommendation: Regular  Filed Weights   07/16/14 1442  Weight: 80.74 kg (178 lb)    History of present illness:  Please see admit h and p from 1/21 for details. Briefly, pt presented with episodes of near syncope. Pt had signs suggestive of orthostasis. In the ED, pt was found to be mildly hypotensive with acute on chronic renal failure. The patient was admitted to the floor.  Hospital Course:  The patient was admitted to the medical floor. Pt's home enalapril dose was decreased from 20mg  to 10mg . She was also continued with aggressive IVF overnight with return to baseline state. Renal function also improved. The patient is medically stable for discharge with close outpatient follow up.  Consultations:  none  Discharge Exam: Filed Vitals:   07/16/14 2000 07/16/14 2106 07/17/14 0608 07/17/14 0943  BP: 123/52 136/63 129/51   Pulse: 76 71 65   Temp:  98.4 F (36.9 C) 98.2 F (36.8 C) 98.7 F (37.1 C)  TempSrc:  Oral Oral Oral  Resp: 25 20 20    Height:      Weight:      SpO2: 92% 97% 96% 100%    General: awake, in nad Cardiovascular: regular, s1, s2 Respiratory: normal resp effort, no wheezing  Discharge Instructions     Medication List    TAKE these medications        allopurinol 300 MG tablet  Commonly known as:  ZYLOPRIM  take 1 tablet by mouth once daily     ALPRAZolam 1 MG tablet  Commonly known as:  XANAX  Take 1 mg by mouth 3 (three) times daily as needed for anxiety.     amLODipine 5 MG  tablet  Commonly known as:  NORVASC  Take 1 tablet (5 mg total) by mouth daily.     aspirin EC 81 MG tablet  Take 81 mg by mouth daily.     diphenoxylate-atropine 2.5-0.025 MG per tablet  Commonly known as:  LOMOTIL  Take 1 tablet by mouth 4 (four) times daily as needed for diarrhea or loose stools.     enalapril 10 MG tablet  Commonly known as:  VASOTEC  Take 1 tablet (10 mg total) by mouth daily.     esomeprazole 40 MG capsule  Commonly known as:  NEXIUM  take 1 capsule by mouth once daily     fenofibrate 160 MG tablet  Take 1 tablet by mouth daily.     ferrous sulfate 325 (65 FE) MG tablet  Commonly known as:  FERROUSUL  Take 1 tablet (325 mg total) by mouth 2 (two) times daily after a meal.     furosemide 20 MG tablet  Commonly known as:  LASIX  take 1 tablet by mouth once daily     hydrALAZINE 25 MG tablet  Commonly known as:  APRESOLINE  take 1 tablet by mouth three times a day     levothyroxine 137 MCG tablet  Commonly known as:  SYNTHROID, LEVOTHROID  take 1 tablet by mouth once daily  PARoxetine 30 MG tablet  Commonly known as:  PAXIL  Take 30 mg by mouth daily.     Potassium Chloride ER 20 MEQ Tbcr  Take 10 mEq by mouth daily.     pravastatin 20 MG tablet  Commonly known as:  PRAVACHOL  take 1 tablet by mouth once daily     predniSONE 5 MG tablet  Commonly known as:  DELTASONE  Take 5 mg by mouth 2 (two) times daily with a meal.     temazepam 30 MG capsule  Commonly known as:  RESTORIL  Take 30 mg by mouth at bedtime as needed for sleep.     traMADol 50 MG tablet  Commonly known as:  ULTRAM  Take 50 mg by mouth every 6 (six) hours as needed (pain).       Allergies  Allergen Reactions  . Aleve [Naproxen Sodium] Other (See Comments)    GI bleed  . Dilaudid [Hydromorphone Hcl]     Can not tolerate  . Dyazide [Hydrochlorothiazide W-Triamterene] Other (See Comments)    cramps  . Zithromax [Azithromycin] Itching   Follow-up Information     Follow up with [name], MD. Schedule an appointment as soon as possible for a visit in 1 week.   Specialty:  Family Medicine   Contact information:   [address] [place] [address] [phone]        The results of significant diagnostics from this hospitalization (including imaging, microbiology, ancillary and laboratory) are listed below for reference.    Significant Diagnostic Studies: Ct Head Wo Contrast  07/16/2014   CLINICAL DATA:  80 year old female recently treated for lung cancer with history of high blood pressure presenting with near syncope. Did not hit head or lose consciousness. Initial encounter.  EXAM: CT HEAD WITHOUT CONTRAST  TECHNIQUE: Contiguous axial images were obtained from the base of the skull through the vertex without intravenous contrast.  COMPARISON:  09/22/2013.  FINDINGS: No intracranial hemorrhage.  No CT evidence of large acute infarct.  Global atrophy without hydrocephalus.  No evidence of intracranial metastatic disease noted on the present unenhanced head CT. Patient was to have MR scan at which time syncopal episode occurred. MR with contrast would prove more sensitive for detection of intracranial metastatic disease.  Mastoid air cells and middle ear cavities as well as paranasal sinuses are clear. Vascular calcifications.  IMPRESSION: No intracranial hemorrhage.  No CT evidence of large acute infarct.  Global atrophy without hydrocephalus.  No evidence of intracranial metastatic disease noted on the present unenhanced head CT. Patient was to have MR scan at which time syncopal episode occurred. MR with contrast would prove more sensitive for detection of intracranial metastatic disease.   Electronically Signed   By: Chauncey Cruel M.D.   On: 07/16/2014 15:54    Microbiology: No results found for this or any previous visit (from the past 240 hour(s)).   Labs: Basic Metabolic Panel:  Recent Labs Lab 07/16/14 1500  07/17/14 0647  NA 138 140  K 4.1 3.7  CL 108 113*  CO2 23 22  GLUCOSE 93 93  BUN 34* 31*  CREATININE 2.49* 1.99*  CALCIUM 8.9 8.5   Liver Function Tests:  Recent Labs Lab 07/17/14 0647  AST 20  ALT 14  ALKPHOS 45  BILITOT 0.4  PROT 5.0*  ALBUMIN 3.1*   No results for input(s): LIPASE, AMYLASE in the last 168 hours. No results for input(s): AMMONIA in the last 168 hours.  CBC:  Recent Labs Lab 07/16/14 1500 07/17/14 0647  WBC 4.7 4.0  NEUTROABS 3.5  --   HGB 10.0* 9.5*  HCT 30.2* 29.3*  MCV 102.0* 103.5*  PLT 188 144*   Cardiac Enzymes:  Recent Labs Lab 07/16/14 1500 07/16/14 1946 07/17/14 0141 07/17/14 0646  TROPONINI 0.05* 0.05* 0.04* 0.04*   BNP: BNP (last 3 results) No results for input(s): PROBNP in the last 8760 hours. CBG: No results for input(s): GLUCAP in the last 168 hours.  Signed:  CHIU, Orpah Melter  [facility] 07/17/2014, 12:43 PM

## 2014-07-18 NOTE — Progress Notes (Signed)
Leah Battiest, MD [place] [address]  Squamous cell carcinoma of left lung - Plan: CBC with Differential, Comprehensive metabolic panel  Loose stools - Plan: diphenoxylate-atropine (LOMOTIL) 2.5-0.025 MG per tablet  Insomnia - Plan: temazepam (RESTORIL) 30 MG capsule  Failure to thrive in adult - Plan: predniSONE (DELTASONE) 5 MG tablet, DISCONTINUED: predniSONE (DELTASONE) 5 MG tablet  Orthostatic hypotension  CURRENT THERAPY: Surveillance  INTERVAL HISTORY: [name] 80 y.o. 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     Squamous cell carcinoma of left lung   06/30/2013 Imaging CT of chest demonstrates 4 cm left lower lobe lesion with other concerning findings for malignancy   07/09/2013 Imaging PET- 4.1 cm left lower oobe lesion with left hilar and bilateral mediastinal lymphadeopathy.  Hepatic lesion not confidently identified.  1.8 cm nodule in left upper lobe, cannot exclude low grade tumor.   07/16/2013 Initial Diagnosis Squamous cell carcinoma of left lung via CT-guided biopsy by IR   07/29/2013 Surgery IR placement of port-a-cath in preparation for concomittant chemoradiation    08/08/2013 - 09/12/2013 Chemotherapy Paclitaxel/Carboplatin weekly x 6   08/08/2013 - 09/19/2013 Radiation Therapy    10/14/2013 Imaging CT CAP- 3.2 cm posterior left lower lobe cavitary mass, corresponding to known primary bronchogenic neoplasm, improved. Small mediastinal nodes measuring up to 8 mm. Two hypodense hepatic lesions measuring up to 12 mm, poorly evaluated.   10/24/2013 Imaging MRI abd- Two liver lesions in the superior right hepatic lobe and left hepatic lobe remain  indeterminate technically, but favor small benign hemangiomas over metastatic lesions.   02/02/2014 Imaging CT CAP- Decreasing size of the left lower lobe mass since prior study. 4 mm right lower lobe subpleural nodule, not seen on prior study.  Slight enlargement of hypodensities within the liver concerning for metastases.   05/26/2014 Imaging CT CAP- Continued evolution of postradiation treatment related changes in the left lung without definite signs of residual or recurrent disease, and no definite evidence of metastatic disease in the chest, abdomen or pelvis on today's examination   07/16/2014 - 07/17/2014 Hospital Admission Acute on chronic renal failure with orthostatic hypotension.  Negative CT of head.     07/16/2014 Imaging CT head wo contrast- No evidence of intracranial metastatic disease noted on the present unenhanced head CT.   I personally reviewed and went over laboratory results with the patient.  The results are noted within this dictation.  I personally reviewed and went over radiographic studies with the patient.  The results are noted within this dictation.  CT of head on 07/16/2014 is negative for metastatic disease.   Chart is reviewed.  She was scheduled on 07/16/2014 for an MRI of brain to evaluate for metastatic disease given some of the patient nonspecific complaints.  In the waiting room for this test, she had a near syncopal event and was transported to the ED.  She was noted to have acute on chronic renal disease with orthostatic hypotension.  CT head was performed and was negative.  She was admitted to the hospital with negative work-up and discharged from the hospital with improvement.  She reports that she has fallen approximately 6 times over the past three months.  This is verified by her  daughter.  She reports that she gets dizzy and then her knees give out.  She denies any LOC or loss of bowel or bladder control.  Her BP in the clinic is 161/73 and she was in a wheelchair,  this is is not secondary to ambulation.  She notes that the dizziness occurs when she goes from a lying or sitting position to standing.  This is indicative of orthostatic hypotension.  I provided her education regarding orthostatic hypotension.  She needs to increase her water intake.  She drinks mostly soda and teas, neither of which are hydrating.    She also complains of chronic loose stools.  She notes that Lomotil works.  She reports that she occasionally has 6-7 loose stools per day, but more frequently has 3 loose stools daily.  Prior to her malignancy diagnosis, she had 3 stools daily, but they were not loose.  Oncologically, she denies any complaints and ROS questioning is negative.    Past Medical History  Diagnosis Date  . Hypertension   . Hypothyroidism   . Hyperlipidemia   . Insomnia   . GERD (gastroesophageal reflux disease)     chronic gastritis  . Adrenal adenoma   . Chronic diarrhea   . Fatty liver   . Arthritis     knee  . QT prolongation     syncope  . Coronary artery disease   . Impaired fasting glucose   . Renal insufficiency     low protien diet  . Tobacco use     1/2 ppd, approx 25-50 pack years (as of 08/2012)  . Family history of heart disease   . IBS (irritable bowel syndrome)   . Peripheral arterial disease     sstatus post  infrarenal abdominal aortic tube graft placed by Dr. Victorino Dike February 2008  . Cancer     Lung  . Squamous cell carcinoma of left lung 07/29/2013    has Unspecified hypothyroidism; Pruritic dermatitis; Chronic renal insufficiency; Generalized anxiety disorder; Insomnia; Esophageal reflux; Coronary artery disease; Essential hypertension; Hyperlipidemia; Peripheral arterial disease; Anemia, iron deficiency; Squamous cell carcinoma of left lung; COPD, moderate; Cholelithiasis; Near syncope; Syncope; Orthostatic hypotension; Loose stools; and Failure to thrive in adult on her problem list.     is allergic to aleve; dilaudid; dyazide;  and zithromax.  [name] had no medications administered during this visit.  Past Surgical History  Procedure Laterality Date  . Abdominal hysterectomy    . Thyroidectomy, partial    . Colonoscopy  5/04    normal  . Abdominal aortic aneurysm repair  07/2006    D. J.D. Kellie Simmering  . Cataract extraction Bilateral 2010  . Cardiac catheterization  02/2010    mod CAD in L-dominant system with normal LV function  . Colon resection  2005    1/2 colon removed  . Transthoracic echocardiogram  02/2010    EF 55-60%; mild conc LVH, normal systolic function; mildly calcified AV annulus  . Colonoscopy with esophagogastroduodenoscopy (egd) N/A 03/19/2013    Procedure: COLONOSCOPY WITH ESOPHAGOGASTRODUODENOSCOPY (EGD);  Surgeon: Rogene Houston, MD;  Location: [facility];  Service: Endoscopy;  Laterality: N/A;  145  . Dg biopsy lung Left Jan 2015    Denies any headaches, double vision, fevers, chills, night sweats, nausea, vomiting, diarrhea, constipation, chest pain, heart palpitations, shortness of breath, blood in stool, black tarry stool, urinary pain, urinary burning, urinary frequency, hematuria.   PHYSICAL EXAMINATION  ECOG PERFORMANCE STATUS: 2 - Symptomatic, <50% confined to  bed  Filed Vitals:   07/20/14 1600  BP: 161/73  Pulse: 87  Temp: 99.1 F (37.3 C)  Resp: 20    GENERAL:alert, no distress, well nourished, well developed, comfortable, cooperative and smiling SKIN: skin color, texture, turgor are normal, no rashes or significant lesions HEAD: Normocephalic, No masses, lesions, tenderness or abnormalities EYES: normal, PERRLA, EOMI, Conjunctiva are pink and non-injected EARS: External ears normal OROPHARYNX:lips, buccal mucosa, and tongue normal and mucous membranes are moist  NECK: supple, trachea midline LYMPH:  not examined BREAST:not examined LUNGS: not examined HEART: not examined ABDOMEN:abdomen soft and normal bowel sounds BACK: Back symmetric, no  curvature. EXTREMITIES:less then 2 second capillary refill, no joint deformities, effusion, or inflammation, no cyanosis  NEURO: alert & oriented x 3 with fluent speech, no focal motor/sensory deficits, in a wheelchair, mini-mental status exam is negative.   LABORATORY DATA: CBC    Component Value Date/Time   WBC 4.0 07/17/2014 0647   RBC 2.83* 07/17/2014 0647   HGB 9.5* 07/17/2014 0647   HCT 29.3* 07/17/2014 0647   PLT 144* 07/17/2014 0647   MCV 103.5* 07/17/2014 0647   MCH 33.6 07/17/2014 0647   MCHC 32.4 07/17/2014 0647   RDW 14.8 07/17/2014 0647   LYMPHSABS 0.6* 07/16/2014 1500   MONOABS 0.5 07/16/2014 1500   EOSABS 0.1 07/16/2014 1500   BASOSABS 0.0 07/16/2014 1500      Chemistry      Component Value Date/Time   NA 140 07/17/2014 0647   K 3.7 07/17/2014 0647   CL 113* 07/17/2014 0647   CO2 22 07/17/2014 0647   BUN 31* 07/17/2014 0647   CREATININE 1.99* 07/17/2014 0647   CREATININE 2.07* 06/24/2014 1239      Component Value Date/Time   CALCIUM 8.5 07/17/2014 0647   ALKPHOS 45 07/17/2014 0647   AST 20 07/17/2014 0647   ALT 14 07/17/2014 0647   BILITOT 0.4 07/17/2014 0647      RADIOGRAPHIC STUDIES:  Ct Head Wo Contrast  07/16/2014   CLINICAL DATA:  80 year old female recently treated for lung cancer with history of high blood pressure presenting with near syncope. Did not hit head or lose consciousness. Initial encounter.  EXAM: CT HEAD WITHOUT CONTRAST  TECHNIQUE: Contiguous axial images were obtained from the base of the skull through the vertex without intravenous contrast.  COMPARISON:  09/22/2013.  FINDINGS: No intracranial hemorrhage.  No CT evidence of large acute infarct.  Global atrophy without hydrocephalus.  No evidence of intracranial metastatic disease noted on the present unenhanced head CT. Patient was to have MR scan at which time syncopal episode occurred. MR with contrast would prove more sensitive for detection of intracranial metastatic  disease.  Mastoid air cells and middle ear cavities as well as paranasal sinuses are clear. Vascular calcifications.  IMPRESSION: No intracranial hemorrhage.  No CT evidence of large acute infarct.  Global atrophy without hydrocephalus.  No evidence of intracranial metastatic disease noted on the present unenhanced head CT. Patient was to have MR scan at which time syncopal episode occurred. MR with contrast would prove more sensitive for detection of intracranial metastatic disease.   Electronically Signed   By: Chauncey Cruel M.D.   On: 07/16/2014 15:54     ASSESSMENT AND PLAN:  Squamous cell carcinoma of left lung Stage III B locally advanced squamous cell carcinoma of the left lower lobe, measuring 4.1 cm involving the left hilar and bilateral mediastinal lymph nodes with questionable hepatic lesions, status post combined modality  therapy utilizing carboplatin/Taxol/radiotherapy with treatment ending on 09/12/2013. NED at this present time.  CT head recently is negative for any intracranial metastatic disease and no oncologic cause of falling.  Restaging scans as planned in March 2016 with follow-up appointment.  Port-flush and labs in March 2016: CBC diff, CMET   Orthostatic hypotension I will discontinue Lasix and she can use it only PRN for LE edema.  She is encouraged to drink more fluids (water) and avoid caffeine beverages.   Insomnia Refill on Temazepam   Loose stools Refill on Lomotil, chronic.   Failure to thrive in adult Will refill Prednisone today.  This is physiologic dose.  I would question utility and discontinue if needed in the future.       THERAPY PLAN:  She will be restaged as planned and return following imaging tests in March 2016.  All questions were answered. The patient knows to call the clinic with any problems, questions or concerns. We can certainly see the patient much sooner if necessary.  Patient and plan discussed with Dr. Ancil Linsey and she is  in agreement with the aforementioned.   I spent 30 minutes counseling the patient face to face. The total time spent in the appointment was 40 minutes.  KEFALAS,THOMAS 07/20/2014

## 2014-07-18 NOTE — Assessment & Plan Note (Addendum)
Stage III B locally advanced squamous cell carcinoma of the left lower lobe, measuring 4.1 cm involving the left hilar and bilateral mediastinal lymph nodes with questionable hepatic lesions, status post combined modality therapy utilizing carboplatin/Taxol/radiotherapy with treatment ending on 09/12/2013. NED at this present time.  CT head recently is negative for any intracranial metastatic disease and no oncologic cause of falling.  Restaging scans as planned in March 2016 with follow-up appointment.  Port-flush and labs in March 2016: CBC diff, CMET

## 2014-07-20 ENCOUNTER — Encounter (HOSPITAL_BASED_OUTPATIENT_CLINIC_OR_DEPARTMENT_OTHER): Payer: Medicare Other | Admitting: Oncology

## 2014-07-20 ENCOUNTER — Encounter (HOSPITAL_COMMUNITY): Payer: Self-pay | Admitting: Oncology

## 2014-07-20 ENCOUNTER — Telehealth: Payer: Self-pay | Admitting: *Deleted

## 2014-07-20 VITALS — BP 161/73 | HR 87 | Temp 99.1°F | Resp 20 | Wt 178.6 lb

## 2014-07-20 DIAGNOSIS — I951 Orthostatic hypotension: Secondary | ICD-10-CM | POA: Diagnosis not present

## 2014-07-20 DIAGNOSIS — C3492 Malignant neoplasm of unspecified part of left bronchus or lung: Secondary | ICD-10-CM

## 2014-07-20 DIAGNOSIS — R195 Other fecal abnormalities: Secondary | ICD-10-CM

## 2014-07-20 DIAGNOSIS — R627 Adult failure to thrive: Secondary | ICD-10-CM | POA: Diagnosis not present

## 2014-07-20 DIAGNOSIS — G4700 Insomnia, unspecified: Secondary | ICD-10-CM

## 2014-07-20 MED ORDER — TEMAZEPAM 30 MG PO CAPS: 30.0000 mg | ORAL_CAPSULE | Freq: Every evening | ORAL | Status: DC | PRN

## 2014-07-20 MED ORDER — PREDNISONE 5 MG PO TABS: 5.0000 mg | ORAL_TABLET | Freq: Two times a day (BID) | ORAL | Status: DC

## 2014-07-20 MED ORDER — DIPHENOXYLATE-ATROPINE 2.5-0.025 MG PO TABS: 1.0000 | ORAL_TABLET | Freq: Four times a day (QID) | ORAL | Status: DC | PRN

## 2014-07-20 NOTE — Telephone Encounter (Signed)
Spoke with daughter concerning how patient is doing after recent hospitalization. Daughter states patient is doing fine -no change- and is following up with oncology today. Daughter scheduled hospital follow up for next Monday with Dr Richardson Landry.

## 2014-07-20 NOTE — Assessment & Plan Note (Signed)
I will discontinue Lasix and she can use it only PRN for LE edema.  She is encouraged to drink more fluids (water) and avoid caffeine beverages.

## 2014-07-20 NOTE — Assessment & Plan Note (Signed)
Refill on Lomotil, chronic.

## 2014-07-20 NOTE — Telephone Encounter (Signed)
ok 

## 2014-07-20 NOTE — Assessment & Plan Note (Signed)
Will refill Prednisone today.  This is physiologic dose.  I would question utility and discontinue if needed in the future.

## 2014-07-20 NOTE — Assessment & Plan Note (Signed)
Refill on Temazepam

## 2014-07-20 NOTE — Patient Instructions (Signed)
[place] at [facility]  Discharge Instructions:  Hold Lasix and use for swelling of LE as needed. Refills on the following medications printed:  1. Prednisone  2. Lomotil  3. Temazepam Increase water intake daily. Ct scans as scheduled in March 2016 Return in March 2016 for follow-up and to review scans. Follow-up with Dr. Wolfgang Phoenix as planned.  _______________________________________________________________  Thank you for choosing [place] at [facility] to provide your oncology and hematology care.  To afford each patient quality time with our providers, please arrive at least 15 minutes before your scheduled appointment.  You need to re-schedule your appointment if you arrive 10 or more minutes late.  We strive to give you quality time with our providers, and arriving late affects you and other patients whose appointments are after yours.  Also, if you no show three or more times for appointments you may be dismissed from the clinic.  Again, thank you for choosing [place] at [facility] hope is that these requests will allow you access to exceptional care and in a timely manner. _______________________________________________________________  If you have questions after your visit, please contact our office at [phone] between the hours of 8:30 a.m. and 5:00 p.m. Voicemails left after 4:30 p.m. will not be returned until the following business day. _______________________________________________________________  For prescription refill requests, have your pharmacy contact our office. _______________________________________________________________  Recommendations made by the consultant and any test results will be sent to your referring physician. _______________________________________________________________

## 2014-07-22 ENCOUNTER — Ambulatory Visit (INDEPENDENT_AMBULATORY_CARE_PROVIDER_SITE_OTHER): Payer: Medicare Other | Admitting: Family Medicine

## 2014-07-22 ENCOUNTER — Encounter: Payer: Self-pay | Admitting: Family Medicine

## 2014-07-22 VITALS — BP 164/98 | Ht 67.0 in | Wt 179.0 lb

## 2014-07-22 DIAGNOSIS — R55 Syncope and collapse: Secondary | ICD-10-CM

## 2014-07-22 DIAGNOSIS — I10 Essential (primary) hypertension: Secondary | ICD-10-CM

## 2014-07-22 MED ORDER — ENALAPRIL MALEATE 10 MG PO TABS: 10.0000 mg | ORAL_TABLET | Freq: Every day | ORAL | Status: DC

## 2014-07-22 MED ORDER — BENZONATATE 100 MG PO CAPS: 100.0000 mg | ORAL_CAPSULE | Freq: Two times a day (BID) | ORAL | Status: DC | PRN

## 2014-07-22 NOTE — Patient Instructions (Addendum)
Check your bottle of enalapril ( vasotec) at home and make sure you are taking the 10mg  tablet. Stop taking furosemide (lasix).

## 2014-07-22 NOTE — Progress Notes (Signed)
   Subjective:    Patient ID: [name], female    DOB: [birth year], 80 y.o.   MRN: [id number]  HPI Patient was at [place] on Thursday for an MRI of her brain to check on cancer. While she was there, she fainted twice. She was still conscious, but confused. They said her BP dropped in the 80's/40's. Took her to the ER, and they said she was dehydrated.  Kept her overnight. Hospital did CT scans and EKG, daughter - Butch Penny said that it was normal.   Butch Penny and Paizlee said that the specialists that Ayeshia sees all wonder if she is on too many BP meds.   Today, her BP was 164/98.   Pt feels tired today, feeling off balance. Often feels unsteady.  Nerves and anxiety about the same, not shaking as bad  Taking fluid pills diuretic very regulalrly. Just yesterday the oncologist encourage her to stop taking the diuretic. She had spells of lightheadedness particularly when standing quickly  No current chest pain no shortness of breath no lightheadedness.  All hospital notes reviewed in patient's and family's presence  Review of Systems No recent loss of consciousness no chest pain no back pain abdominal pain no change in bowel habits    Objective:   Physical Exam  Alert no acute distress. HEENT normal. Lungs clear. Heart regular rhythm. Blood pressure 138/76 similar on repeat. Right side and left side nearly equivalent. Ankles without edema.      Assessment & Plan:  . Impression hypertension with recent near-syncope and low blood pressure. Discuss with family challenges of maintaining blood pressure on multiple medicines and with atherosclerosis which leads to orthostatic changes. Plan hold off diuretic. Diet exercise discussed. Orthostatic reducing measures discussed at length. Follow-up as scheduled. WSL

## 2014-07-27 ENCOUNTER — Ambulatory Visit: Payer: Medicare Other | Admitting: Family Medicine

## 2014-08-16 ENCOUNTER — Other Ambulatory Visit: Payer: Self-pay | Admitting: Family Medicine

## 2014-08-17 ENCOUNTER — Other Ambulatory Visit: Payer: Self-pay | Admitting: Family Medicine

## 2014-08-17 NOTE — Telephone Encounter (Signed)
Ok plus five monthlyr ref

## 2014-08-18 ENCOUNTER — Other Ambulatory Visit: Payer: Self-pay | Admitting: Family Medicine

## 2014-08-19 ENCOUNTER — Telehealth: Payer: Self-pay | Admitting: Family Medicine

## 2014-08-19 NOTE — Telephone Encounter (Signed)
error 

## 2014-08-19 NOTE — Telephone Encounter (Signed)
Ok plus 5 monthly ref 

## 2014-08-20 ENCOUNTER — Encounter (HOSPITAL_COMMUNITY): Payer: Medicare Other

## 2014-08-26 ENCOUNTER — Other Ambulatory Visit: Payer: Self-pay | Admitting: Family Medicine

## 2014-08-27 ENCOUNTER — Ambulatory Visit (HOSPITAL_COMMUNITY)
Admission: RE | Admit: 2014-08-27 | Discharge: 2014-08-27 | Disposition: A | Discharge status: Home / Self Care | Payer: Medicare Other | Source: Ambulatory Visit | Attending: Oncology | Admitting: Oncology

## 2014-08-27 DIAGNOSIS — Z85118 Personal history of other malignant neoplasm of bronchus and lung: Secondary | ICD-10-CM | POA: Diagnosis not present

## 2014-08-27 DIAGNOSIS — Z87891 Personal history of nicotine dependence: Secondary | ICD-10-CM | POA: Insufficient documentation

## 2014-08-27 DIAGNOSIS — Z8520 Personal history of malignant neoplasm of unspecified respiratory organ: Secondary | ICD-10-CM | POA: Diagnosis not present

## 2014-08-27 DIAGNOSIS — C3492 Malignant neoplasm of unspecified part of left bronchus or lung: Secondary | ICD-10-CM | POA: Diagnosis not present

## 2014-08-27 DIAGNOSIS — K7689 Other specified diseases of liver: Secondary | ICD-10-CM | POA: Diagnosis not present

## 2014-08-28 ENCOUNTER — Ambulatory Visit (HOSPITAL_COMMUNITY): Payer: Medicare Other | Admitting: Hematology & Oncology

## 2014-08-28 ENCOUNTER — Encounter (HOSPITAL_COMMUNITY): Payer: Self-pay

## 2014-08-28 ENCOUNTER — Encounter (HOSPITAL_COMMUNITY): Payer: Medicare Other | Attending: Hematology and Oncology

## 2014-08-28 DIAGNOSIS — C3432 Malignant neoplasm of lower lobe, left bronchus or lung: Secondary | ICD-10-CM | POA: Diagnosis not present

## 2014-08-28 DIAGNOSIS — C3492 Malignant neoplasm of unspecified part of left bronchus or lung: Secondary | ICD-10-CM | POA: Insufficient documentation

## 2014-08-28 LAB — COMPREHENSIVE METABOLIC PANEL
ALT: 18 U/L (ref 0–35)
AST: 26 U/L (ref 0–37)
Albumin: 3.9 g/dL (ref 3.5–5.2)
Alkaline Phosphatase: 36 U/L — ABNORMAL LOW (ref 39–117)
Anion gap: 9 (ref 5–15)
BILIRUBIN TOTAL: 0.5 mg/dL (ref 0.3–1.2)
BUN: 20 mg/dL (ref 6–23)
CALCIUM: 9.4 mg/dL (ref 8.4–10.5)
CHLORIDE: 109 mmol/L (ref 96–112)
CO2: 25 mmol/L (ref 19–32)
CREATININE: 1.93 mg/dL — AB (ref 0.50–1.10)
GFR calc Af Amer: 28 mL/min — ABNORMAL LOW (ref >90)
GFR, EST NON AFRICAN AMERICAN: 24 mL/min — AB (ref >90)
Glucose, Bld: 108 mg/dL — ABNORMAL HIGH (ref 70–99)
Potassium: 3.6 mmol/L (ref 3.5–5.1)
Sodium: 143 mmol/L (ref 135–145)
Total Protein: 6.5 g/dL (ref 6.0–8.3)

## 2014-08-28 LAB — CBC WITH DIFFERENTIAL/PLATELET
BASOS ABS: 0 10*3/uL (ref 0.0–0.1)
BASOS PCT: 1 % (ref 0–1)
EOS ABS: 0.1 10*3/uL (ref 0.0–0.7)
Eosinophils Relative: 1 % (ref 0–5)
HCT: 37 % (ref 36.0–46.0)
Hemoglobin: 11.9 g/dL — ABNORMAL LOW (ref 12.0–15.0)
Lymphocytes Relative: 14 % (ref 12–46)
Lymphs Abs: 0.8 10*3/uL (ref 0.7–4.0)
MCH: 33.1 pg (ref 26.0–34.0)
MCHC: 32.2 g/dL (ref 30.0–36.0)
MCV: 102.8 fL — ABNORMAL HIGH (ref 78.0–100.0)
MONO ABS: 0.6 10*3/uL (ref 0.1–1.0)
Monocytes Relative: 10 % (ref 3–12)
NEUTROS ABS: 4.1 10*3/uL (ref 1.7–7.7)
Neutrophils Relative %: 74 % (ref 43–77)
PLATELETS: 264 10*3/uL (ref 150–400)
RBC: 3.6 MIL/uL — ABNORMAL LOW (ref 3.87–5.11)
RDW: 14.8 % (ref 11.5–15.5)
WBC: 5.5 10*3/uL (ref 4.0–10.5)

## 2014-08-28 MED ORDER — HEPARIN SOD (PORK) LOCK FLUSH 100 UNIT/ML IV SOLN
500.0000 [IU] | Freq: Once | INTRAVENOUS | Status: AC
  Administered 2014-08-28: 500 [IU] via INTRAVENOUS

## 2014-08-28 MED ORDER — SODIUM CHLORIDE 0.9 % IJ SOLN
10.0000 mL | INTRAMUSCULAR | Status: DC | PRN
  Administered 2014-08-28: 10 mL via INTRAVENOUS
  Filled 2014-08-28: qty 10

## 2014-08-28 MED ORDER — HEPARIN SOD (PORK) LOCK FLUSH 100 UNIT/ML IV SOLN
INTRAVENOUS | Status: AC
  Filled 2014-08-28: qty 5

## 2014-08-28 NOTE — Patient Instructions (Signed)
[place] at [facility]  Discharge Instructions:  You had your port flushed.  Follow up as scheduled.  Call the clinic if you have any questions or concerns _______________________________________________________________  Thank you for choosing [place] at [facility] to provide your oncology and hematology care.  To afford each patient quality time with our providers, please arrive at least 15 minutes before your scheduled appointment.  You need to re-schedule your appointment if you arrive 10 or more minutes late.  We strive to give you quality time with our providers, and arriving late affects you and other patients whose appointments are after yours.  Also, if you no show three or more times for appointments you may be dismissed from the clinic.  Again, thank you for choosing [place] at [place] hope is that these requests will allow you access to exceptional care and in a timely manner. _______________________________________________________________  If you have questions after your visit, please contact our office at [phone] between the hours of 8:30 a.m. and 5:00 p.m. Voicemails left after 4:30 p.m. will not be returned until the following business day. _______________________________________________________________  For prescription refill requests, have your pharmacy contact our office. _______________________________________________________________  Recommendations made by the consultant and any test results will be sent to your referring physician. _______________________________________________________________

## 2014-08-28 NOTE — Progress Notes (Signed)
[name] presented for Portacath access and flush.  Proper placement of portacath confirmed by CXR.  Portacath located right chest wall accessed with  H 20 needle.  Good blood return present. Portacath flushed with 37ml NS and 500U/17ml Heparin and needle removed intact.  Procedure tolerated well and without incident.

## 2014-08-28 NOTE — Progress Notes (Signed)
Leah Battiest, MD [place] [address]  No diagnosis found.  CURRENT THERAPY: Surveillance  INTERVAL HISTORY: [name] 80 y.o. 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     Squamous cell carcinoma of left lung   06/30/2013 Imaging CT of chest demonstrates 4 cm left lower lobe lesion with other concerning findings for malignancy   07/09/2013 Imaging PET- 4.1 cm left lower oobe lesion with left hilar and bilateral mediastinal lymphadeopathy.  Hepatic lesion not confidently identified.  1.8 cm nodule in left upper lobe, cannot exclude low grade tumor.   07/16/2013 Initial Diagnosis Squamous cell carcinoma of left lung via CT-guided biopsy by IR   07/29/2013 Surgery IR placement of port-a-cath in preparation for concomittant chemoradiation    08/08/2013 - 09/12/2013 Chemotherapy Paclitaxel/Carboplatin weekly x 6   08/08/2013 - 09/19/2013 Radiation Therapy    10/14/2013 Imaging CT CAP- 3.2 cm posterior left lower lobe cavitary mass, corresponding to known primary bronchogenic neoplasm, improved. Small mediastinal nodes measuring up to 8 mm. Two hypodense hepatic lesions measuring up to 12 mm, poorly evaluated.   10/24/2013 Imaging MRI abd- Two liver lesions in the superior right hepatic lobe and left hepatic lobe remain indeterminate technically, but favor small benign hemangiomas over metastatic lesions.   02/02/2014 Imaging CT CAP- Decreasing size of the left lower lobe mass since prior study. 4 mm right lower lobe subpleural nodule, not seen on prior study.  Slight enlargement of hypodensities within the liver concerning for metastases.   05/26/2014 Imaging CT CAP- Continued  evolution of postradiation treatment related changes in the left lung without definite signs of residual or recurrent disease, and no definite evidence of metastatic disease in the chest, abdomen or pelvis on today's examination   07/16/2014 - 07/17/2014 Hospital Admission Acute on chronic renal failure with orthostatic hypotension.  Negative CT of head.     07/16/2014 Imaging CT head wo contrast- No evidence of intracranial metastatic disease noted on the present unenhanced head CT.   I personally reviewed and went over laboratory results with the patient.  The results are noted within this dictation.  I personally reviewed and went over radiographic studies with the patient.  The results are noted within this dictation.  CT of head on 07/16/2014 is negative for metastatic disease.   Chart is reviewed.  She was scheduled on 07/16/2014 for an MRI of brain to evaluate for metastatic disease given some of the patient nonspecific complaints.  In the waiting room for this test, she had a near syncopal event and was transported to the ED.  She was noted to have acute on chronic renal disease with orthostatic hypotension.  CT head was performed and was negative.  She was admitted to the hospital with negative work-up and discharged from the hospital with improvement.  She reports that she has fallen approximately 6 times over the past three months.  This is verified by her daughter.  She reports that she gets dizzy and then her knees give out.  She denies any LOC or loss of bowel or bladder control.  Her BP in the clinic is 161/73 and she was in a wheelchair, this is is not secondary to ambulation.  She notes that the dizziness  occurs when she goes from a lying or sitting position to standing.  This is indicative of orthostatic hypotension.  I provided her education regarding orthostatic hypotension.  She needs to increase her water intake.  She drinks mostly soda and teas, neither of which are hydrating.    She  also complains of chronic loose stools.  She notes that Lomotil works.  She reports that she occasionally has 6-7 loose stools per day, but more frequently has 3 loose stools daily.  Prior to her malignancy diagnosis, she had 3 stools daily, but they were not loose.  Oncologically, she denies any complaints and ROS questioning is negative.    Past Medical History  Diagnosis Date  . Hypertension   . Hypothyroidism   . Hyperlipidemia   . Insomnia   . GERD (gastroesophageal reflux disease)     chronic gastritis  . Adrenal adenoma   . Chronic diarrhea   . Fatty liver   . Arthritis     knee  . QT prolongation     syncope  . Coronary artery disease   . Impaired fasting glucose   . Renal insufficiency     low protien diet  . Tobacco use     1/2 ppd, approx 25-50 pack years (as of 08/2012)  . Family history of heart disease   . IBS (irritable bowel syndrome)   . Peripheral arterial disease     sstatus post  infrarenal abdominal aortic tube graft placed by Dr. Victorino Dike February 2008  . Cancer     Lung  . Squamous cell carcinoma of left lung 07/29/2013    has Unspecified hypothyroidism; Pruritic dermatitis; Chronic renal insufficiency; Generalized anxiety disorder; Insomnia; Esophageal reflux; Coronary artery disease; Essential hypertension; Hyperlipidemia; Peripheral arterial disease; Anemia, iron deficiency; Squamous cell carcinoma of left lung; COPD, moderate; Cholelithiasis; Near syncope; Syncope; Orthostatic hypotension; Loose stools; and Failure to thrive in adult on her problem list.     is allergic to aleve; dilaudid; dyazide; and zithromax.  Ms. Galentine does not currently have medications on file.  Past Surgical History  Procedure Laterality Date  . Abdominal hysterectomy    . Thyroidectomy, partial    . Colonoscopy  5/04    normal  . Abdominal aortic aneurysm repair  07/2006    D. J.D. Kellie Simmering  . Cataract extraction Bilateral 2010  . Cardiac catheterization  02/2010     mod CAD in L-dominant system with normal LV function  . Colon resection  2005    1/2 colon removed  . Transthoracic echocardiogram  02/2010    EF 55-60%; mild conc LVH, normal systolic function; mildly calcified AV annulus  . Colonoscopy with esophagogastroduodenoscopy (egd) N/A 03/19/2013    Procedure: COLONOSCOPY WITH ESOPHAGOGASTRODUODENOSCOPY (EGD);  Surgeon: Rogene Houston, MD;  Location: [facility];  Service: Endoscopy;  Laterality: N/A;  145  . Dg biopsy lung Left Jan 2015    Denies any headaches, double vision, fevers, chills, night sweats, nausea, vomiting, diarrhea, constipation, chest pain, heart palpitations, shortness of breath, blood in stool, black tarry stool, urinary pain, urinary burning, urinary frequency, hematuria.   PHYSICAL EXAMINATION  ECOG PERFORMANCE STATUS: 2 - Symptomatic, <50% confined to bed  There were no vitals filed for this visit.  GENERAL:alert, no distress, well nourished, well developed, comfortable, cooperative and smiling SKIN: skin color, texture, turgor are normal, no rashes or significant lesions HEAD: Normocephalic, No masses, lesions, tenderness or abnormalities EYES: normal, PERRLA, EOMI, Conjunctiva are pink and non-injected EARS: External  ears normal OROPHARYNX:lips, buccal mucosa, and tongue normal and mucous membranes are moist  NECK: supple, trachea midline LYMPH:  not examined BREAST:not examined LUNGS: not examined HEART: not examined ABDOMEN:abdomen soft and normal bowel sounds BACK: Back symmetric, no curvature. EXTREMITIES:less then 2 second capillary refill, no joint deformities, effusion, or inflammation, no cyanosis  NEURO: alert & oriented x 3 with fluent speech, no focal motor/sensory deficits, in a wheelchair, mini-mental status exam is negative.   LABORATORY DATA: CBC    Component Value Date/Time   WBC 4.0 07/17/2014 0647   RBC 2.83* 07/17/2014 0647   HGB 9.5* 07/17/2014 0647   HCT 29.3* 07/17/2014 0647   PLT  144* 07/17/2014 0647   MCV 103.5* 07/17/2014 0647   MCH 33.6 07/17/2014 0647   MCHC 32.4 07/17/2014 0647   RDW 14.8 07/17/2014 0647   LYMPHSABS 0.6* 07/16/2014 1500   MONOABS 0.5 07/16/2014 1500   EOSABS 0.1 07/16/2014 1500   BASOSABS 0.0 07/16/2014 1500      Chemistry      Component Value Date/Time   NA 140 07/17/2014 0647   K 3.7 07/17/2014 0647   CL 113* 07/17/2014 0647   CO2 22 07/17/2014 0647   BUN 31* 07/17/2014 0647   CREATININE 1.99* 07/17/2014 0647   CREATININE 2.07* 06/24/2014 1239      Component Value Date/Time   CALCIUM 8.5 07/17/2014 0647   ALKPHOS 45 07/17/2014 0647   AST 20 07/17/2014 0647   ALT 14 07/17/2014 0647   BILITOT 0.4 07/17/2014 0647      RADIOGRAPHIC STUDIES:  Ct Abdomen Pelvis Wo Contrast  08/27/2014   CLINICAL DATA:  History of squamous cell carcinoma of the left lung.  EXAM: CT CHEST, ABDOMEN AND PELVIS WITHOUT CONTRAST  TECHNIQUE: Multidetector CT imaging of the chest, abdomen and pelvis was performed following the standard protocol without IV contrast.  COMPARISON:  05/26/2014  FINDINGS: CT CHEST FINDINGS  Mediastinum: The heart size appears normal. There is no pericardial effusion identified. Calcified atherosclerotic disease involves the thoracic aorta as well as the LAD and left circumflex Coronary artery. No mediastinal or hilar adenopathy identified.  Lungs/Pleura: No pleural effusion identified. Paramediastinal radiation change is identified within both lungs. The patient is status post left upper lobectomy. The nonspecific nodular opacity within the posterior left lung appears unchanged measuring 1.6 x 1.0 cm, image 45/series 6. Moderate changes of centrilobular emphysema identified. Stable small subpleural nodule within the posterior right lung base measuring 5 mm, image 54/series 6.  Musculoskeletal: Review of the visualized bony structures is negative for aggressive lytic or sclerotic bone lesion.  CT ABDOMEN AND PELVIS FINDINGS   Hepatobiliary: Low-attenuation structure within the right lobe of liver is stable measuring 1.9 cm, image 52/series 2. Unchanged from previous exam. In the left hepatic lobe there is a 1.9 cm low attenuation structure, image 56/series 2. Also unchanged. The previously noted gallstone has migrated into the cystic duct. This measures 1 cm. No intrahepatic duct dilatation identified.  Pancreas: Normal appearance of the pancreas.  Spleen: Normal appearance of the spleen.  Adrenals/Urinary Tract: The right adrenal gland appears normal. Stable left adrenal gland nodule measuring 2.5 x 2.9 cm and -5 Hounsfield units compatible with a benign adenoma. The kidneys are both normal.  Stomach/Bowel: Stomach is unremarkable. The small bowel loops have a normal caliber. The patient is status post subtotal colectomy.  Vascular/Lymphatic: Status post graft repair for the infrarenal abdominal aorta. 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 There is no adenopathy within the abdomen or pelvis. No inguinal adenopathy.  Reproductive: Previous hysterectomy.  No adnexal mass noted.  Other: There is no ascites or focal fluid collections within the abdomen or pelvis.  Musculoskeletal: Review of the visualized bony structures shows no aggressive lytic or sclerotic bone lesions.  IMPRESSION: 1. Stable appearance of the chest without definite signs of residual or recurrent disease and no evidence for metastatic disease in the chest, abdomen or pelvis. 2. Unchanged size of hepatic lesions, previously characterized by MRI as probable hemangiomas.   Electronically Signed   By: Kerby Moors M.D.   On: 08/27/2014 12:42   Ct Chest Wo Contrast  08/27/2014   CLINICAL DATA:  History of squamous cell carcinoma of the left lung.  EXAM: CT CHEST, ABDOMEN AND PELVIS WITHOUT CONTRAST  TECHNIQUE: Multidetector CT imaging of the chest, abdomen and pelvis was  performed following the standard protocol without IV contrast.  COMPARISON:  05/26/2014  FINDINGS: CT CHEST FINDINGS  Mediastinum: The heart size appears normal. There is no pericardial effusion identified. Calcified atherosclerotic disease involves the thoracic aorta as well as the LAD and left circumflex Coronary artery. No mediastinal or hilar adenopathy identified.  Lungs/Pleura: No pleural effusion identified. Paramediastinal radiation change is identified within both lungs. The patient is status post left upper lobectomy. The nonspecific nodular opacity within the posterior left lung appears unchanged measuring 1.6 x 1.0 cm, image 45/series 6. Moderate changes of centrilobular emphysema identified. Stable small subpleural nodule within the posterior right lung base measuring 5 mm, image 54/series 6.  Musculoskeletal: Review of the visualized bony structures is negative for aggressive lytic or sclerotic bone lesion.  CT ABDOMEN AND PELVIS FINDINGS  Hepatobiliary: Low-attenuation structure within the right lobe of liver is stable measuring 1.9 cm, image 52/series 2. Unchanged from previous exam. In the left hepatic lobe there is a 1.9 cm low attenuation structure, image 56/series 2. Also unchanged. The previously noted gallstone has migrated into the cystic duct. This measures 1 cm. No intrahepatic duct dilatation identified.  Pancreas: Normal appearance of the pancreas.  Spleen: Normal appearance of the spleen.  Adrenals/Urinary Tract: The right adrenal gland appears normal. Stable left adrenal gland nodule measuring 2.5 x 2.9 cm and -5 Hounsfield units compatible with a benign adenoma. The kidneys are both normal.  Stomach/Bowel: Stomach is unremarkable. The small bowel loops have a normal caliber. The patient is status post subtotal colectomy.  Vascular/Lymphatic: Status post graft repair for the infrarenal abdominal aorta. Extensive atherosclerosis throughout the abdominal and pelvic vasculature with  aneurysmal dilatation of the distal left common iliac artery. There is no adenopathy within the abdomen or pelvis. No inguinal adenopathy.  Reproductive: Previous hysterectomy.  No adnexal mass noted.  Other: There is no ascites or focal fluid collections within the abdomen or pelvis.  Musculoskeletal: Review of the visualized bony structures shows no aggressive lytic or sclerotic bone lesions.  IMPRESSION: 1. Stable appearance of the chest without definite signs of residual or recurrent disease and no evidence for metastatic disease in the chest, abdomen or pelvis. 2. Unchanged size of hepatic lesions, previously characterized by MRI as probable hemangiomas.   Electronically Signed   By: Kerby Moors M.D.   On: 08/27/2014 12:42     ASSESSMENT AND PLAN:  No problem-specific assessment & plan notes found for this encounter.   THERAPY PLAN:  She will be restaged as planned and return following imaging tests in March 2016.  All questions were answered. The patient knows to call the clinic with any problems, questions or concerns. We can certainly see the patient much sooner if necessary.  Patient and plan discussed with Dr. Ancil Linsey and she is in agreement with the aforementioned.   I spent 30 minutes counseling the patient face to face. The total time spent in the appointment was 40 minutes.  Kar[place], [place] 08/28/2014    This encounter was created in error - please disregard.

## 2014-08-31 ENCOUNTER — Telehealth (HOSPITAL_COMMUNITY): Payer: Self-pay | Admitting: Emergency Medicine

## 2014-08-31 NOTE — Telephone Encounter (Signed)
-----   Message from Baird [facility], PA-C sent at 08/28/2014  5:14 PM EST ----- Excellent!

## 2014-08-31 NOTE — Telephone Encounter (Signed)
Called left pt a message that her blood work was good

## 2014-09-01 ENCOUNTER — Other Ambulatory Visit: Payer: Self-pay | Admitting: Family Medicine

## 2014-09-02 ENCOUNTER — Other Ambulatory Visit: Payer: Self-pay | Admitting: Family Medicine

## 2014-09-03 ENCOUNTER — Ambulatory Visit (HOSPITAL_COMMUNITY): Payer: Medicare Other | Admitting: Oncology

## 2014-09-07 ENCOUNTER — Encounter (HOSPITAL_COMMUNITY): Payer: Medicare Other | Attending: Oncology | Admitting: Oncology

## 2014-09-07 ENCOUNTER — Encounter (HOSPITAL_COMMUNITY): Payer: Self-pay | Admitting: Oncology

## 2014-09-07 VITALS — BP 146/76 | HR 79 | Temp 98.6°F | Resp 18 | Wt 180.2 lb

## 2014-09-07 DIAGNOSIS — C3492 Malignant neoplasm of unspecified part of left bronchus or lung: Secondary | ICD-10-CM

## 2014-09-07 DIAGNOSIS — C3432 Malignant neoplasm of lower lobe, left bronchus or lung: Secondary | ICD-10-CM

## 2014-09-07 DIAGNOSIS — D509 Iron deficiency anemia, unspecified: Secondary | ICD-10-CM

## 2014-09-07 DIAGNOSIS — D539 Nutritional anemia, unspecified: Secondary | ICD-10-CM

## 2014-09-07 NOTE — Progress Notes (Signed)
Leah Battiest, [place] [address]  Anemia, iron deficiency - Plan: CBC with Differential, Comprehensive metabolic panel, Vitamin S93, Folate, Iron and TIBC, Ferritin  Squamous cell carcinoma of left lung - Plan: CT Chest Wo Contrast, CBC with Differential, Comprehensive metabolic panel  Macrocytic anemia - Plan: Vitamin B12, Folate  CURRENT THERAPY: Surveillance per NCCN guidelines  INTERVAL HISTORY: [name] 80 y.o. 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     Squamous cell carcinoma of left lung   06/30/2013 Imaging CT of chest demonstrates 4 cm left lower lobe lesion with other concerning findings for malignancy   07/09/2013 Imaging PET- 4.1 cm left lower oobe lesion with left hilar and bilateral mediastinal lymphadeopathy.  Hepatic lesion not confidently identified.  1.8 cm nodule in left upper lobe, cannot exclude low grade tumor.   07/16/2013 Initial Diagnosis Squamous cell carcinoma of left lung via CT-guided biopsy by IR   07/29/2013 Surgery IR placement of port-a-cath in preparation for concomittant chemoradiation    08/08/2013 - 09/12/2013 Chemotherapy Paclitaxel/Carboplatin weekly x 6   08/08/2013 - 09/19/2013 Radiation Therapy    10/14/2013 Imaging CT CAP- 3.2 cm posterior left lower lobe cavitary mass, corresponding to known primary bronchogenic neoplasm, improved. Small mediastinal nodes measuring up to 8 mm. Two hypodense hepatic lesions measuring up to 12 mm, poorly evaluated.   10/24/2013 Imaging MRI abd- Two liver lesions in the superior right hepatic lobe and left hepatic lobe remain indeterminate technically, but favor small benign hemangiomas over  metastatic lesions.   02/02/2014 Imaging CT CAP- Decreasing size of the left lower lobe mass since prior study. 4 mm right lower lobe subpleural nodule, not seen on prior study.  Slight enlargement of hypodensities within the liver concerning for metastases.   05/26/2014 Imaging CT CAP- Continued evolution of postradiation treatment related changes in the left lung without definite signs of residual or recurrent disease, and no definite evidence of metastatic disease in the chest, abdomen or pelvis on today's examination   07/16/2014 - 07/17/2014 Hospital Admission Acute on chronic renal failure with orthostatic hypotension.  Negative CT of head.     07/16/2014 Imaging CT head wo contrast- No evidence of intracranial metastatic disease noted on the present unenhanced head CT.   08/27/2014 Remission CT CAP- Stable appearance of the chest without definite signs of residual or recurrent disease and no evidence for metastatic disease in the chest, abdomen or pelvis.    I personally reviewed and went over laboratory results with the patient.  The results are noted within this dictation.  I personally reviewed and went over radiographic studies with the patient.  The results are noted within this dictation.  She notes continued dizziness and has been referred to Dr. Merlene Laughter for further evaluation and treatment of this symptom.  From an oncology standpoint, we have nothing to contribute.  She has been set up for head imaging in the past and it has been negative.  Will defer to neurology.  Oncologically, she denies any other complaints and ROS questioning is negative.   Past Medical History  Diagnosis Date  . Hypertension   . Hypothyroidism   . Hyperlipidemia   . Insomnia   . GERD (gastroesophageal reflux disease)  chronic gastritis  . Adrenal adenoma   . Chronic diarrhea   . Fatty liver   . Arthritis     knee  . QT prolongation     syncope  . Coronary artery disease   . Impaired fasting glucose    . Renal insufficiency     low protien diet  . Tobacco use     1/2 ppd, approx 25-50 pack years (as of 08/2012)  . Family history of heart disease   . IBS (irritable bowel syndrome)   . Peripheral arterial disease     sstatus post  infrarenal abdominal aortic tube graft placed by Dr. Victorino Dike February 2008  . Cancer     Lung  . Squamous cell carcinoma of left lung 07/29/2013    has Unspecified hypothyroidism; Pruritic dermatitis; Chronic renal insufficiency; Generalized anxiety disorder; Insomnia; Esophageal reflux; Coronary artery disease; Essential hypertension; Hyperlipidemia; Peripheral arterial disease; Anemia, iron deficiency; Squamous cell carcinoma of left lung; COPD, moderate; Cholelithiasis; Near syncope; Syncope; Orthostatic hypotension; Loose stools; and Failure to thrive in adult on her problem list.     is allergic to aleve; dilaudid; dyazide; and zithromax.  Ms. Shiflett does not currently have medications on file.  Past Surgical History  Procedure Laterality Date  . Abdominal hysterectomy    . Thyroidectomy, partial    . Colonoscopy  5/04    normal  . Abdominal aortic aneurysm repair  07/2006    D. J.D. Kellie Simmering  . Cataract extraction Bilateral 2010  . Cardiac catheterization  02/2010    mod CAD in L-dominant system with normal LV function  . Colon resection  2005    1/2 colon removed  . Transthoracic echocardiogram  02/2010    EF 55-60%; mild conc LVH, normal systolic function; mildly calcified AV annulus  . Colonoscopy with esophagogastroduodenoscopy (egd) N/A 03/19/2013    Procedure: COLONOSCOPY WITH ESOPHAGOGASTRODUODENOSCOPY (EGD);  Surgeon: Rogene Houston, MD;  Location: [facility];  Service: Endoscopy;  Laterality: N/A;  145  . Dg biopsy lung Left Jan 2015    Denies any double vision, fevers, chills, night sweats, nausea, vomiting, diarrhea, constipation, chest pain, heart palpitations, shortness of breath, blood in stool, black tarry stool, urinary pain,  urinary burning, urinary frequency, hematuria.   PHYSICAL EXAMINATION  ECOG PERFORMANCE STATUS: 1 - Symptomatic but completely ambulatory  Filed Vitals:   09/07/14 1457  BP: 146/76  Pulse: 79  Temp: 98.6 F (37 C)  Resp: 18    GENERAL:alert, no distress, well nourished, well developed, comfortable, cooperative and smiling SKIN: skin color, texture, turgor are normal, no rashes or significant lesions HEAD: Normocephalic, No masses, lesions, tenderness or abnormalities EYES: normal, PERRLA, EOMI, Conjunctiva are pink and non-injected EARS: External ears normal OROPHARYNX:lips, buccal mucosa, and tongue normal and mucous membranes are moist  NECK: supple, no adenopathy, thyroid normal size, non-tender, without nodularity, no stridor, non-tender, trachea midline LYMPH:  no palpable lymphadenopathy BREAST:not examined LUNGS: clear to auscultation and percussion HEART: regular rate & rhythm, no murmurs, no gallops, S1 normal and S2 normal ABDOMEN:abdomen soft, non-tender, normal bowel sounds and no masses or organomegaly BACK: Back symmetric, no curvature., No CVA tenderness EXTREMITIES:less then 2 second capillary refill, no joint deformities, effusion, or inflammation, no edema, no skin discoloration  NEURO: alert & oriented x 3 with fluent speech, no focal motor/sensory deficits, gait normal   LABORATORY DATA: CBC    Component Value Date/Time   WBC 5.5 08/28/2014 1415   RBC 3.60* 08/28/2014 1415  HGB 11.9* 08/28/2014 1415   HCT 37.0 08/28/2014 1415   PLT 264 08/28/2014 1415   MCV 102.8* 08/28/2014 1415   MCH 33.1 08/28/2014 1415   MCHC 32.2 08/28/2014 1415   RDW 14.8 08/28/2014 1415   LYMPHSABS 0.8 08/28/2014 1415   MONOABS 0.6 08/28/2014 1415   EOSABS 0.1 08/28/2014 1415   BASOSABS 0.0 08/28/2014 1415      Chemistry      Component Value Date/Time   NA 143 08/28/2014 1415   K 3.6 08/28/2014 1415   CL 109 08/28/2014 1415   CO2 25 08/28/2014 1415   BUN 20  08/28/2014 1415   CREATININE 1.93* 08/28/2014 1415   CREATININE 2.07* 06/24/2014 1239      Component Value Date/Time   CALCIUM 9.4 08/28/2014 1415   ALKPHOS 36* 08/28/2014 1415   AST 26 08/28/2014 1415   ALT 18 08/28/2014 1415   BILITOT 0.5 08/28/2014 1415      RADIOGRAPHIC STUDIES:  Ct Abdomen Pelvis Wo Contrast  08/27/2014   CLINICAL DATA:  History of squamous cell carcinoma of the left lung.  EXAM: CT CHEST, ABDOMEN AND PELVIS WITHOUT CONTRAST  TECHNIQUE: Multidetector CT imaging of the chest, abdomen and pelvis was performed following the standard protocol without IV contrast.  COMPARISON:  05/26/2014  FINDINGS: CT CHEST FINDINGS  Mediastinum: The heart size appears normal. There is no pericardial effusion identified. Calcified atherosclerotic disease involves the thoracic aorta as well as the LAD and left circumflex Coronary artery. No mediastinal or hilar adenopathy identified.  Lungs/Pleura: No pleural effusion identified. Paramediastinal radiation change is identified within both lungs. The patient is status post left upper lobectomy. The nonspecific nodular opacity within the posterior left lung appears unchanged measuring 1.6 x 1.0 cm, image 45/series 6. Moderate changes of centrilobular emphysema identified. Stable small subpleural nodule within the posterior right lung base measuring 5 mm, image 54/series 6.  Musculoskeletal: Review of the visualized bony structures is negative for aggressive lytic or sclerotic bone lesion.  CT ABDOMEN AND PELVIS FINDINGS  Hepatobiliary: Low-attenuation structure within the right lobe of liver is stable measuring 1.9 cm, image 52/series 2. Unchanged from previous exam. In the left hepatic lobe there is a 1.9 cm low attenuation structure, image 56/series 2. Also unchanged. The previously noted gallstone has migrated into the cystic duct. This measures 1 cm. No intrahepatic duct dilatation identified.  Pancreas: Normal appearance of the pancreas.  Spleen:  Normal appearance of the spleen.  Adrenals/Urinary Tract: The right adrenal gland appears normal. Stable left adrenal gland nodule measuring 2.5 x 2.9 cm and -5 Hounsfield units compatible with a benign adenoma. The kidneys are both normal.  Stomach/Bowel: Stomach is unremarkable. The small bowel loops have a normal caliber. The patient is status post subtotal colectomy.  Vascular/Lymphatic: Status post graft repair for the infrarenal abdominal aorta. 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 There is no adenopathy within the abdomen or pelvis. No inguinal adenopathy.  Reproductive: Previous hysterectomy.  No adnexal mass noted.  Other: There is no ascites or focal fluid collections within the abdomen or pelvis.  Musculoskeletal: Review of the visualized bony structures shows no aggressive lytic or sclerotic bone lesions.  IMPRESSION: 1. Stable appearance of the chest without definite signs of residual or recurrent disease and no evidence for metastatic disease in the chest, abdomen or pelvis. 2. Unchanged size of hepatic lesions, previously characterized by MRI as probable hemangiomas.   Electronically  Signed   By: Kerby Moors M.D.   On: 08/27/2014 12:42   Ct Chest Wo Contrast  08/27/2014   CLINICAL DATA:  History of squamous cell carcinoma of the left lung.  EXAM: CT CHEST, ABDOMEN AND PELVIS WITHOUT CONTRAST  TECHNIQUE: Multidetector CT imaging of the chest, abdomen and pelvis was performed following the standard protocol without IV contrast.  COMPARISON:  05/26/2014  FINDINGS: CT CHEST FINDINGS  Mediastinum: The heart size appears normal. There is no pericardial effusion identified. Calcified atherosclerotic disease involves the thoracic aorta as well as the LAD and left circumflex Coronary artery. No mediastinal or hilar adenopathy identified.  Lungs/Pleura: No pleural effusion identified. Paramediastinal  radiation change is identified within both lungs. The patient is status post left upper lobectomy. The nonspecific nodular opacity within the posterior left lung appears unchanged measuring 1.6 x 1.0 cm, image 45/series 6. Moderate changes of centrilobular emphysema identified. Stable small subpleural nodule within the posterior right lung base measuring 5 mm, image 54/series 6.  Musculoskeletal: Review of the visualized bony structures is negative for aggressive lytic or sclerotic bone lesion.  CT ABDOMEN AND PELVIS FINDINGS  Hepatobiliary: Low-attenuation structure within the right lobe of liver is stable measuring 1.9 cm, image 52/series 2. Unchanged from previous exam. In the left hepatic lobe there is a 1.9 cm low attenuation structure, image 56/series 2. Also unchanged. The previously noted gallstone has migrated into the cystic duct. This measures 1 cm. No intrahepatic duct dilatation identified.  Pancreas: Normal appearance of the pancreas.  Spleen: Normal appearance of the spleen.  Adrenals/Urinary Tract: The right adrenal gland appears normal. Stable left adrenal gland nodule measuring 2.5 x 2.9 cm and -5 Hounsfield units compatible with a benign adenoma. The kidneys are both normal.  Stomach/Bowel: Stomach is unremarkable. The small bowel loops have a normal caliber. The patient is status post subtotal colectomy.  Vascular/Lymphatic: Status post graft repair for the infrarenal abdominal aorta. 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 There is no adenopathy within the abdomen or pelvis. No inguinal adenopathy.  Reproductive: Previous hysterectomy.  No adnexal mass noted.  Other: There is no ascites or focal fluid collections within the abdomen or pelvis.  Musculoskeletal: Review of the visualized bony structures shows no aggressive lytic or sclerotic bone lesions.  IMPRESSION: 1. Stable appearance of the  chest without definite signs of residual or recurrent disease and no evidence for metastatic disease in the chest, abdomen or pelvis. 2. Unchanged size of hepatic lesions, previously characterized by MRI as probable hemangiomas.   Electronically Signed   By: Kerby Moors M.D.   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 Restaging imaging from earlier this month demonstrates remission of disease.  She can have her port removed when she is ready.  She had it placed by IR in [facility] and notes that she would rather have it removed locally by Dr. Arnoldo Morale.  She is not yet ready to have it removed and she will contact us when she is ready for that referral.  Labs in 6 months: CBC diff, CMET, anemia panel, and repeat CT imaging of chest in 6 months per  NCCN guidelines.    THERAPY PLAN:  NCCN guidelines for Non-Small Cell Lung Cancer Surveillance are as follows:  A. H+P every 6-12 months and CT of chest every 6 months for the first two years  B. Low-dose spiral CT chest annually after first two years of surveillance CTs   C. PET scan not typically indicated for surveillance  D. Smoking cessation advice, counseling, and pharmacotherapy   All questions were answered. The patient knows to call the clinic with any problems, questions or concerns. We can certainly see the patient much sooner if necessary.  Patient and plan discussed with Dr. Ancil Linsey and she is in agreement with the aforementioned.   This note is electronically signed by: Robynn Pane 09/07/2014 4:12 PM

## 2014-09-07 NOTE — Patient Instructions (Signed)
[place] at [facility] Discharge Instructions  RECOMMENDATIONS MADE BY THE CONSULTANT AND ANY TEST RESULTS WILL BE SENT TO YOUR REFERRING PHYSICIAN.  Return for CT as scheduled. Return for lab work and office visit in 6 months. Call us when you decide to have your port removed and we will refer you to Dr. Arnoldo Morale.  Thank you for choosing [place] at [facility] to provide your oncology and hematology care.  To afford each patient quality time with our provider, please arrive at least 15 minutes before your scheduled appointment time.    You need to re-schedule your appointment should you arrive 10 or more minutes late.  We strive to give you quality time with our providers, and arriving late affects you and other patients whose appointments are after yours.  Also, if you no show three or more times for appointments you may be dismissed from the clinic at the providers discretion.     Again, thank you for choosing [facility].  Our hope is that these requests will decrease the amount of time that you wait before being seen by our physicians.       _____________________________________________________________  Should you have questions after your visit to [facility], please contact our office at [phone] between the hours of 8:30 a.m. and 4:30 p.m.  Voicemails left after 4:30 p.m. will not be returned until the following business day.  For prescription refill requests, have your pharmacy contact our office.

## 2014-09-07 NOTE — Assessment & Plan Note (Addendum)
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 Restaging imaging from earlier this month demonstrates remission of disease.  She can have her port removed when she is ready.  She had it placed by IR in [facility] and notes that she would rather have it removed locally by Dr. Arnoldo Morale.  She is not yet ready to have it removed and she will contact us when she is ready for that referral.  Labs in 6 months: CBC diff, CMET, anemia panel, and repeat CT imaging of chest in 6 months per NCCN guidelines.

## 2014-09-23 ENCOUNTER — Ambulatory Visit (INDEPENDENT_AMBULATORY_CARE_PROVIDER_SITE_OTHER): Payer: Medicare Other | Admitting: Family Medicine

## 2014-09-23 ENCOUNTER — Encounter: Payer: Self-pay | Admitting: Family Medicine

## 2014-09-23 VITALS — BP 132/80 | Temp 99.1°F | Ht 67.0 in | Wt 179.0 lb

## 2014-09-23 DIAGNOSIS — I739 Peripheral vascular disease, unspecified: Secondary | ICD-10-CM | POA: Diagnosis not present

## 2014-09-23 DIAGNOSIS — N3000 Acute cystitis without hematuria: Secondary | ICD-10-CM

## 2014-09-23 DIAGNOSIS — I10 Essential (primary) hypertension: Secondary | ICD-10-CM | POA: Diagnosis not present

## 2014-09-23 DIAGNOSIS — J449 Chronic obstructive pulmonary disease, unspecified: Secondary | ICD-10-CM

## 2014-09-23 DIAGNOSIS — R300 Dysuria: Secondary | ICD-10-CM

## 2014-09-23 LAB — POCT URINALYSIS DIPSTICK
Nitrite, UA: POSITIVE
PH UA: 5
Protein, UA: 30
RBC UA: 250
Spec Grav, UA: 1.02

## 2014-09-23 MED ORDER — CIPROFLOXACIN HCL 500 MG PO TABS: ORAL_TABLET | ORAL | Status: AC

## 2014-09-23 MED ORDER — TRAMADOL HCL 50 MG PO TABS: 50.0000 mg | ORAL_TABLET | Freq: Four times a day (QID) | ORAL | Status: DC | PRN

## 2014-09-23 NOTE — Progress Notes (Signed)
   Subjective:    Patient ID: [name], female    DOB: [birth year], 80 y.o.   MRN: [id number]  Dysuria  This is a new problem. Episode onset: 4 days ago. Associated symptoms include urgency. Associated symptoms comments: Low grade fever.   Headache. Wakes up with headache for the past 2 -3 days. Takes aleve sometimes. rx for tramadol expired.   Feels hot under right arm and down right side. Started last week.    Fever and chills low grade and some dysuria  Patient goes into great length the discussion regarding overall health. Continues to feel dizzy at times. Compliant with blood pressure medicine. Has dropped down dose is requested. Still mild dizziness.  Due to see oncologist but unfortunately missed that appointment. Wonders my thoughts on new oncologist   Review of Systems  Genitourinary: Positive for dysuria and urgency.   no vomiting no diarrhea no high fever no chills     Objective:   Physical Exam Alert baseline high anxiety. Vital stable HEENT normal neck supple. Lungs clear heart regular in rhythm no true CVA tenderness.       Assessment & Plan:  Impression 1 UTI urinalysis shows 10-15 white blood cells with some systemic feature though no clear pyelonephritis #2 hypertension good control with medicine change #3 dizziness ongoing #4 unusual symptomatology regarding warmth right side of thorax etiology unclear non-evident on exam today plan despite our best efforts to make this a short-term visit 25 minutes spent most in discussion. Cipro twice a day 7 days. Follow-up chronic appointment. WSL

## 2014-09-30 ENCOUNTER — Other Ambulatory Visit: Payer: Self-pay | Admitting: Family Medicine

## 2014-10-02 ENCOUNTER — Other Ambulatory Visit: Payer: Self-pay | Admitting: Family Medicine

## 2014-10-09 ENCOUNTER — Other Ambulatory Visit (HOSPITAL_COMMUNITY): Payer: Medicare Other

## 2014-10-11 ENCOUNTER — Other Ambulatory Visit: Payer: Self-pay | Admitting: Family Medicine

## 2014-10-12 ENCOUNTER — Other Ambulatory Visit: Payer: Self-pay | Admitting: Neurology

## 2014-10-12 DIAGNOSIS — R4182 Altered mental status, unspecified: Secondary | ICD-10-CM | POA: Diagnosis not present

## 2014-10-12 DIAGNOSIS — G309 Alzheimer's disease, unspecified: Secondary | ICD-10-CM | POA: Diagnosis not present

## 2014-10-12 DIAGNOSIS — G250 Essential tremor: Secondary | ICD-10-CM | POA: Diagnosis not present

## 2014-10-12 DIAGNOSIS — R2681 Unsteadiness on feet: Secondary | ICD-10-CM | POA: Diagnosis not present

## 2014-10-15 DIAGNOSIS — R5383 Other fatigue: Secondary | ICD-10-CM | POA: Diagnosis not present

## 2014-10-15 DIAGNOSIS — E538 Deficiency of other specified B group vitamins: Secondary | ICD-10-CM | POA: Diagnosis not present

## 2014-10-15 DIAGNOSIS — Z79899 Other long term (current) drug therapy: Secondary | ICD-10-CM | POA: Diagnosis not present

## 2014-10-15 DIAGNOSIS — E559 Vitamin D deficiency, unspecified: Secondary | ICD-10-CM | POA: Diagnosis not present

## 2014-10-15 DIAGNOSIS — M818 Other osteoporosis without current pathological fracture: Secondary | ICD-10-CM | POA: Diagnosis not present

## 2014-10-16 ENCOUNTER — Ambulatory Visit (HOSPITAL_COMMUNITY)
Admission: RE | Admit: 2014-10-16 | Discharge: 2014-10-16 | Disposition: A | Discharge status: Home / Self Care | Payer: Medicare Other | Source: Ambulatory Visit | Attending: Neurology | Admitting: Neurology

## 2014-10-16 DIAGNOSIS — C3490 Malignant neoplasm of unspecified part of unspecified bronchus or lung: Secondary | ICD-10-CM | POA: Diagnosis not present

## 2014-10-16 DIAGNOSIS — Z87891 Personal history of nicotine dependence: Secondary | ICD-10-CM | POA: Insufficient documentation

## 2014-10-16 DIAGNOSIS — R4182 Altered mental status, unspecified: Secondary | ICD-10-CM | POA: Diagnosis not present

## 2014-10-16 DIAGNOSIS — R930 Abnormal findings on diagnostic imaging of skull and head, not elsewhere classified: Secondary | ICD-10-CM | POA: Diagnosis not present

## 2014-10-16 MED ORDER — GADOBENATE DIMEGLUMINE 529 MG/ML IV SOLN
8.0000 mL | Freq: Once | INTRAVENOUS | Status: AC | PRN
  Administered 2014-10-16: 8 mL via INTRAVENOUS

## 2014-10-19 ENCOUNTER — Encounter: Payer: Self-pay | Admitting: Family Medicine

## 2014-10-19 ENCOUNTER — Ambulatory Visit (INDEPENDENT_AMBULATORY_CARE_PROVIDER_SITE_OTHER): Payer: Medicare Other | Admitting: Family Medicine

## 2014-10-19 VITALS — BP 138/86 | Ht 67.0 in | Wt 181.6 lb

## 2014-10-19 DIAGNOSIS — G4700 Insomnia, unspecified: Secondary | ICD-10-CM

## 2014-10-19 DIAGNOSIS — I10 Essential (primary) hypertension: Secondary | ICD-10-CM | POA: Diagnosis not present

## 2014-10-19 DIAGNOSIS — J449 Chronic obstructive pulmonary disease, unspecified: Secondary | ICD-10-CM

## 2014-10-19 DIAGNOSIS — R251 Tremor, unspecified: Secondary | ICD-10-CM

## 2014-10-19 NOTE — Progress Notes (Signed)
   Subjective:    Patient ID: [name], female    DOB: [birth year], 80 y.o.   MRN: [id number]  Hypertension This is a chronic problem. The current episode started more than 1 year ago. Risk factors for coronary artery disease include dyslipidemia and post-menopausal state. Treatments tried: vasotec, norvasc. There are no compliance problems.   BP meds faithfully. Watching salt intake.   Patient states she still having problems with dizziness but not as bad as it was. We did back off on a blood pressure medicine.   Patient states she also has a burning sensation on her skin on her right side. Primarily this side of the chest. No associated mass or lump.  Appetite generally good, knows she has to eat  Anxiety not as bad as it was, less tremor than before. Still an ongoing challenge though.  Review of Systems No headache no chest pain no back pain no abdominal pain no change in bowel habits some ongoing trouble sleeping    Objective:   Physical Exam Alert vitals stable blood pressure stable HEENT normal neck supple. Lungs clear heart regular in rhythm neuro exam intact. Chest wall no evident problem.       Assessment & Plan:  Impression 1 hypertension good control #2 chronic anxiety ongoing discussed #3 dizziness improved #4 tremor stable #5 insomnia discussed plan diet exercise discussed medications refilled. Recheck in several months. WSL

## 2014-10-20 ENCOUNTER — Encounter (HOSPITAL_COMMUNITY): Payer: Self-pay

## 2014-10-20 ENCOUNTER — Other Ambulatory Visit: Payer: Self-pay | Admitting: Family Medicine

## 2014-10-20 ENCOUNTER — Encounter (HOSPITAL_COMMUNITY): Payer: Medicare Other | Attending: Hematology and Oncology

## 2014-10-20 VITALS — BP 145/71 | HR 81 | Temp 98.1°F | Resp 18

## 2014-10-20 DIAGNOSIS — C3492 Malignant neoplasm of unspecified part of left bronchus or lung: Secondary | ICD-10-CM | POA: Diagnosis not present

## 2014-10-20 DIAGNOSIS — C3432 Malignant neoplasm of lower lobe, left bronchus or lung: Secondary | ICD-10-CM | POA: Diagnosis present

## 2014-10-20 DIAGNOSIS — D539 Nutritional anemia, unspecified: Secondary | ICD-10-CM

## 2014-10-20 DIAGNOSIS — R627 Adult failure to thrive: Secondary | ICD-10-CM

## 2014-10-20 DIAGNOSIS — D509 Iron deficiency anemia, unspecified: Secondary | ICD-10-CM

## 2014-10-20 LAB — COMPREHENSIVE METABOLIC PANEL
ALBUMIN: 3.9 g/dL (ref 3.5–5.2)
ALT: 16 U/L (ref 0–35)
AST: 23 U/L (ref 0–37)
Alkaline Phosphatase: 39 U/L (ref 39–117)
Anion gap: 10 (ref 5–15)
BILIRUBIN TOTAL: 0.8 mg/dL (ref 0.3–1.2)
BUN: 27 mg/dL — ABNORMAL HIGH (ref 6–23)
CALCIUM: 9.3 mg/dL (ref 8.4–10.5)
CO2: 23 mmol/L (ref 19–32)
CREATININE: 1.79 mg/dL — AB (ref 0.50–1.10)
Chloride: 107 mmol/L (ref 96–112)
GFR calc Af Amer: 30 mL/min — ABNORMAL LOW (ref >90)
GFR calc non Af Amer: 26 mL/min — ABNORMAL LOW (ref >90)
Glucose, Bld: 122 mg/dL — ABNORMAL HIGH (ref 70–99)
POTASSIUM: 3.7 mmol/L (ref 3.5–5.1)
Sodium: 140 mmol/L (ref 135–145)
Total Protein: 6.4 g/dL (ref 6.0–8.3)

## 2014-10-20 LAB — CBC WITH DIFFERENTIAL/PLATELET
BASOS ABS: 0 10*3/uL (ref 0.0–0.1)
BASOS PCT: 0 % (ref 0–1)
Eosinophils Absolute: 0.2 10*3/uL (ref 0.0–0.7)
Eosinophils Relative: 3 % (ref 0–5)
HCT: 36.5 % (ref 36.0–46.0)
HEMOGLOBIN: 11.7 g/dL — AB (ref 12.0–15.0)
LYMPHS PCT: 8 % — AB (ref 12–46)
Lymphs Abs: 0.6 10*3/uL — ABNORMAL LOW (ref 0.7–4.0)
MCH: 33.1 pg (ref 26.0–34.0)
MCHC: 32.1 g/dL (ref 30.0–36.0)
MCV: 103.1 fL — ABNORMAL HIGH (ref 78.0–100.0)
MONO ABS: 0.5 10*3/uL (ref 0.1–1.0)
Monocytes Relative: 7 % (ref 3–12)
NEUTROS ABS: 5.8 10*3/uL (ref 1.7–7.7)
Neutrophils Relative %: 82 % — ABNORMAL HIGH (ref 43–77)
Platelets: 232 10*3/uL (ref 150–400)
RBC: 3.54 MIL/uL — ABNORMAL LOW (ref 3.87–5.11)
RDW: 14.7 % (ref 11.5–15.5)
WBC: 7.1 10*3/uL (ref 4.0–10.5)

## 2014-10-20 MED ORDER — HEPARIN SOD (PORK) LOCK FLUSH 100 UNIT/ML IV SOLN
500.0000 [IU] | Freq: Once | INTRAVENOUS | Status: AC
  Administered 2014-10-20: 500 [IU] via INTRAVENOUS

## 2014-10-20 MED ORDER — PREDNISONE 5 MG PO TABS: 5.0000 mg | ORAL_TABLET | Freq: Two times a day (BID) | ORAL | Status: DC

## 2014-10-20 MED ORDER — SODIUM CHLORIDE 0.9 % IJ SOLN
10.0000 mL | INTRAMUSCULAR | Status: DC | PRN
  Administered 2014-10-20: 10 mL via INTRAVENOUS
  Filled 2014-10-20: qty 10

## 2014-10-20 NOTE — Patient Instructions (Signed)
[place] at [facility]  Discharge Instructions:  You had your port flushed today.  Please follow up as scheduled Call the clinic if you have any questions or concerns _______________________________________________________________  Thank you for choosing [place] at [facility] to provide your oncology and hematology care.  To afford each patient quality time with our providers, please arrive at least 15 minutes before your scheduled appointment.  You need to re-schedule your appointment if you arrive 10 or more minutes late.  We strive to give you quality time with our providers, and arriving late affects you and other patients whose appointments are after yours.  Also, if you no show three or more times for appointments you may be dismissed from the clinic.  Again, thank you for choosing [place] at [place] hope is that these requests will allow you access to exceptional care and in a timely manner. _______________________________________________________________  If you have questions after your visit, please contact our office at [phone] between the hours of 8:30 a.m. and 5:00 p.m. Voicemails left after 4:30 p.m. will not be returned until the following business day. _______________________________________________________________  For prescription refill requests, have your pharmacy contact our office. _______________________________________________________________  Recommendations made by the consultant and any test results will be sent to your referring physician. _______________________________________________________________

## 2014-10-20 NOTE — Progress Notes (Signed)
[name] presented for Portacath access and flush.  Proper placement of portacath confirmed by CXR.  Portacath located right chest wall accessed with  H 20 needle.  Good blood return present. Portacath flushed with 20ml NS and 500U/5ml Heparin and needle removed intact.  Procedure tolerated well and without incident.    

## 2014-10-21 LAB — IRON AND TIBC
Iron: 98 ug/dL (ref 42–145)
Saturation Ratios: 26 % (ref 20–55)
TIBC: 370 ug/dL (ref 250–470)
UIBC: 272 ug/dL (ref 125–400)

## 2014-10-21 LAB — VITAMIN B12: Vitamin B-12: 712 pg/mL (ref 211–911)

## 2014-10-21 LAB — FOLATE

## 2014-10-21 LAB — FERRITIN: FERRITIN: 247 ng/mL (ref 10–291)

## 2014-11-02 ENCOUNTER — Telehealth: Payer: Self-pay | Admitting: Family Medicine

## 2014-11-02 ENCOUNTER — Other Ambulatory Visit: Payer: Self-pay | Admitting: *Deleted

## 2014-11-02 MED ORDER — ALLOPURINOL 300 MG PO TABS: 300.0000 mg | ORAL_TABLET | Freq: Every day | ORAL | Status: DC

## 2014-11-02 MED ORDER — LEVOTHYROXINE SODIUM 137 MCG PO TABS: 137.0000 ug | ORAL_TABLET | Freq: Every day | ORAL | Status: DC

## 2014-11-02 NOTE — Telephone Encounter (Signed)
levothyroxine (SYNTHROID, LEVOTHROID) 137 MCG tablet allopurinol (ZYLOPRIM) 300 MG tablet  Pt needs these meds as soon as possible, she is out, [facility]  States they have sent them several times an that we denied them   Please order as soon as possible.

## 2014-11-02 NOTE — Telephone Encounter (Signed)
Pt notified on voicemail that we have not refused any meds and meds have been sent to pharm.

## 2014-11-07 ENCOUNTER — Other Ambulatory Visit: Payer: Self-pay | Admitting: Family Medicine

## 2014-11-15 ENCOUNTER — Encounter (HOSPITAL_COMMUNITY): Payer: Self-pay | Admitting: *Deleted

## 2014-11-15 ENCOUNTER — Inpatient Hospital Stay (HOSPITAL_COMMUNITY)
Admission: EM | Admit: 2014-11-15 | Discharge: 2014-11-21 | DRG: 193 | Disposition: A | Discharge status: Home Health | Payer: Medicare Other | Attending: Family Medicine | Admitting: Family Medicine

## 2014-11-15 ENCOUNTER — Emergency Department (HOSPITAL_COMMUNITY): Payer: Medicare Other

## 2014-11-15 DIAGNOSIS — R0902 Hypoxemia: Secondary | ICD-10-CM | POA: Diagnosis not present

## 2014-11-15 DIAGNOSIS — K760 Fatty (change of) liver, not elsewhere classified: Secondary | ICD-10-CM | POA: Diagnosis present

## 2014-11-15 DIAGNOSIS — C50911 Malignant neoplasm of unspecified site of right female breast: Secondary | ICD-10-CM | POA: Diagnosis not present

## 2014-11-15 DIAGNOSIS — J449 Chronic obstructive pulmonary disease, unspecified: Secondary | ICD-10-CM | POA: Diagnosis not present

## 2014-11-15 DIAGNOSIS — C3492 Malignant neoplasm of unspecified part of left bronchus or lung: Secondary | ICD-10-CM | POA: Diagnosis not present

## 2014-11-15 DIAGNOSIS — J189 Pneumonia, unspecified organism: Secondary | ICD-10-CM | POA: Diagnosis not present

## 2014-11-15 DIAGNOSIS — R0602 Shortness of breath: Secondary | ICD-10-CM | POA: Diagnosis present

## 2014-11-15 DIAGNOSIS — Z8249 Family history of ischemic heart disease and other diseases of the circulatory system: Secondary | ICD-10-CM

## 2014-11-15 DIAGNOSIS — I129 Hypertensive chronic kidney disease with stage 1 through stage 4 chronic kidney disease, or unspecified chronic kidney disease: Secondary | ICD-10-CM | POA: Diagnosis present

## 2014-11-15 DIAGNOSIS — J9601 Acute respiratory failure with hypoxia: Secondary | ICD-10-CM | POA: Diagnosis present

## 2014-11-15 DIAGNOSIS — Z85118 Personal history of other malignant neoplasm of bronchus and lung: Secondary | ICD-10-CM | POA: Diagnosis not present

## 2014-11-15 DIAGNOSIS — K219 Gastro-esophageal reflux disease without esophagitis: Secondary | ICD-10-CM | POA: Diagnosis present

## 2014-11-15 DIAGNOSIS — Y95 Nosocomial condition: Secondary | ICD-10-CM | POA: Diagnosis present

## 2014-11-15 DIAGNOSIS — M1990 Unspecified osteoarthritis, unspecified site: Secondary | ICD-10-CM | POA: Diagnosis present

## 2014-11-15 DIAGNOSIS — Z87440 Personal history of urinary (tract) infections: Secondary | ICD-10-CM

## 2014-11-15 DIAGNOSIS — Z833 Family history of diabetes mellitus: Secondary | ICD-10-CM | POA: Diagnosis not present

## 2014-11-15 DIAGNOSIS — R739 Hyperglycemia, unspecified: Secondary | ICD-10-CM | POA: Diagnosis present

## 2014-11-15 DIAGNOSIS — T380X5A Adverse effect of glucocorticoids and synthetic analogues, initial encounter: Secondary | ICD-10-CM | POA: Diagnosis present

## 2014-11-15 DIAGNOSIS — Z923 Personal history of irradiation: Secondary | ICD-10-CM

## 2014-11-15 DIAGNOSIS — J90 Pleural effusion, not elsewhere classified: Secondary | ICD-10-CM | POA: Diagnosis present

## 2014-11-15 DIAGNOSIS — R05 Cough: Secondary | ICD-10-CM | POA: Diagnosis not present

## 2014-11-15 DIAGNOSIS — I2510 Atherosclerotic heart disease of native coronary artery without angina pectoris: Secondary | ICD-10-CM | POA: Diagnosis present

## 2014-11-15 DIAGNOSIS — N179 Acute kidney failure, unspecified: Secondary | ICD-10-CM | POA: Diagnosis not present

## 2014-11-15 DIAGNOSIS — F411 Generalized anxiety disorder: Secondary | ICD-10-CM | POA: Diagnosis present

## 2014-11-15 DIAGNOSIS — I10 Essential (primary) hypertension: Secondary | ICD-10-CM | POA: Diagnosis present

## 2014-11-15 DIAGNOSIS — E785 Hyperlipidemia, unspecified: Secondary | ICD-10-CM | POA: Diagnosis present

## 2014-11-15 DIAGNOSIS — D509 Iron deficiency anemia, unspecified: Secondary | ICD-10-CM | POA: Diagnosis present

## 2014-11-15 DIAGNOSIS — N184 Chronic kidney disease, stage 4 (severe): Secondary | ICD-10-CM | POA: Diagnosis present

## 2014-11-15 DIAGNOSIS — Z9221 Personal history of antineoplastic chemotherapy: Secondary | ICD-10-CM

## 2014-11-15 DIAGNOSIS — E039 Hypothyroidism, unspecified: Secondary | ICD-10-CM | POA: Diagnosis present

## 2014-11-15 DIAGNOSIS — N189 Chronic kidney disease, unspecified: Secondary | ICD-10-CM | POA: Diagnosis present

## 2014-11-15 DIAGNOSIS — Z87891 Personal history of nicotine dependence: Secondary | ICD-10-CM | POA: Diagnosis not present

## 2014-11-15 DIAGNOSIS — R918 Other nonspecific abnormal finding of lung field: Secondary | ICD-10-CM | POA: Diagnosis not present

## 2014-11-15 HISTORY — DX: Chronic obstructive pulmonary disease, unspecified: J44.9

## 2014-11-15 HISTORY — DX: Pleural effusion, not elsewhere classified: J90

## 2014-11-15 HISTORY — DX: Pneumonia, unspecified organism: J18.9

## 2014-11-15 HISTORY — DX: Acute respiratory failure, unspecified whether with hypoxia or hypercapnia: J96.00

## 2014-11-15 LAB — I-STAT CHEM 8, ED
BUN: 15 mg/dL (ref 6–20)
CHLORIDE: 103 mmol/L (ref 101–111)
CREATININE: 2.2 mg/dL — AB (ref 0.44–1.00)
Calcium, Ion: 1.22 mmol/L (ref 1.13–1.30)
Glucose, Bld: 162 mg/dL — ABNORMAL HIGH (ref 65–99)
HCT: 35 % — ABNORMAL LOW (ref 36.0–46.0)
Hemoglobin: 11.9 g/dL — ABNORMAL LOW (ref 12.0–15.0)
Potassium: 3.4 mmol/L — ABNORMAL LOW (ref 3.5–5.1)
SODIUM: 138 mmol/L (ref 135–145)
TCO2: 21 mmol/L (ref 0–100)

## 2014-11-15 LAB — BASIC METABOLIC PANEL
Anion gap: 9 (ref 5–15)
BUN: 15 mg/dL (ref 6–20)
CHLORIDE: 105 mmol/L (ref 101–111)
CO2: 23 mmol/L (ref 22–32)
Calcium: 8.9 mg/dL (ref 8.9–10.3)
Creatinine, Ser: 2.13 mg/dL — ABNORMAL HIGH (ref 0.44–1.00)
GFR calc Af Amer: 24 mL/min — ABNORMAL LOW (ref >60)
GFR, EST NON AFRICAN AMERICAN: 21 mL/min — AB (ref >60)
Glucose, Bld: 163 mg/dL — ABNORMAL HIGH (ref 65–99)
Potassium: 3.3 mmol/L — ABNORMAL LOW (ref 3.5–5.1)
Sodium: 137 mmol/L (ref 135–145)

## 2014-11-15 LAB — CBC WITH DIFFERENTIAL/PLATELET
BASOS PCT: 0 % (ref 0–1)
Basophils Absolute: 0 10*3/uL (ref 0.0–0.1)
EOS ABS: 0.2 10*3/uL (ref 0.0–0.7)
Eosinophils Relative: 3 % (ref 0–5)
HCT: 34 % — ABNORMAL LOW (ref 36.0–46.0)
Hemoglobin: 10.9 g/dL — ABNORMAL LOW (ref 12.0–15.0)
LYMPHS ABS: 0.9 10*3/uL (ref 0.7–4.0)
LYMPHS PCT: 15 % (ref 12–46)
MCH: 33.2 pg (ref 26.0–34.0)
MCHC: 32.1 g/dL (ref 30.0–36.0)
MCV: 103.7 fL — ABNORMAL HIGH (ref 78.0–100.0)
Monocytes Absolute: 0.8 10*3/uL (ref 0.1–1.0)
Monocytes Relative: 13 % — ABNORMAL HIGH (ref 3–12)
NEUTROS PCT: 69 % (ref 43–77)
Neutro Abs: 4.2 10*3/uL (ref 1.7–7.7)
PLATELETS: 201 10*3/uL (ref 150–400)
RBC: 3.28 MIL/uL — ABNORMAL LOW (ref 3.87–5.11)
RDW: 14.8 % (ref 11.5–15.5)
WBC: 6.1 10*3/uL (ref 4.0–10.5)

## 2014-11-15 LAB — I-STAT TROPONIN, ED: Troponin i, poc: 0.06 ng/mL (ref 0.00–0.08)

## 2014-11-15 LAB — BRAIN NATRIURETIC PEPTIDE: B Natriuretic Peptide: 280 pg/mL — ABNORMAL HIGH (ref 0.0–100.0)

## 2014-11-15 LAB — TROPONIN I: Troponin I: 0.09 ng/mL — ABNORMAL HIGH (ref <0.031)

## 2014-11-15 LAB — I-STAT CG4 LACTIC ACID, ED: Lactic Acid, Venous: 1.67 mmol/L (ref 0.5–2.0)

## 2014-11-15 MED ORDER — HYDRALAZINE HCL 25 MG PO TABS
25.0000 mg | ORAL_TABLET | Freq: Three times a day (TID) | ORAL | Status: DC
Start: 2014-11-15 — End: 2014-11-21
  Administered 2014-11-16 – 2014-11-21 (×18): 25 mg via ORAL
  Filled 2014-11-15 (×18): qty 1

## 2014-11-15 MED ORDER — OXYCODONE HCL 5 MG PO TABS
5.0000 mg | ORAL_TABLET | ORAL | Status: DC | PRN
  Administered 2014-11-16 – 2014-11-17 (×3): 5 mg via ORAL
  Filled 2014-11-15 (×3): qty 1

## 2014-11-15 MED ORDER — TEMAZEPAM 15 MG PO CAPS
30.0000 mg | ORAL_CAPSULE | Freq: Every evening | ORAL | Status: DC | PRN
  Filled 2014-11-15: qty 2

## 2014-11-15 MED ORDER — PRAVASTATIN SODIUM 10 MG PO TABS
20.0000 mg | ORAL_TABLET | Freq: Every day | ORAL | Status: DC
  Administered 2014-11-16 – 2014-11-21 (×6): 20 mg via ORAL
  Filled 2014-11-15 (×6): qty 2

## 2014-11-15 MED ORDER — PANTOPRAZOLE SODIUM 40 MG PO TBEC
40.0000 mg | DELAYED_RELEASE_TABLET | Freq: Every day | ORAL | Status: DC
  Administered 2014-11-16 – 2014-11-21 (×6): 40 mg via ORAL
  Filled 2014-11-15 (×6): qty 1

## 2014-11-15 MED ORDER — ALPRAZOLAM 0.5 MG PO TABS
0.5000 mg | ORAL_TABLET | Freq: Three times a day (TID) | ORAL | Status: DC | PRN
  Administered 2014-11-16 – 2014-11-21 (×13): 0.5 mg via ORAL
  Filled 2014-11-15 (×14): qty 1

## 2014-11-15 MED ORDER — ALBUTEROL (5 MG/ML) CONTINUOUS INHALATION SOLN
10.0000 mg | INHALATION_SOLUTION | RESPIRATORY_TRACT | Status: AC
Start: 2014-11-15 — End: 2014-11-15
  Administered 2014-11-15: 10 mg via RESPIRATORY_TRACT
  Filled 2014-11-15: qty 20

## 2014-11-15 MED ORDER — SODIUM CHLORIDE 0.9 % IV SOLN
INTRAVENOUS | Status: DC
  Administered 2014-11-16 – 2014-11-19 (×2): via INTRAVENOUS

## 2014-11-15 MED ORDER — FOLIC ACID 1 MG PO TABS
1.0000 mg | ORAL_TABLET | Freq: Every day | ORAL | Status: DC
  Administered 2014-11-16 – 2014-11-21 (×6): 1 mg via ORAL
  Filled 2014-11-15 (×6): qty 1

## 2014-11-15 MED ORDER — ONDANSETRON HCL 4 MG/2ML IJ SOLN
4.0000 mg | Freq: Four times a day (QID) | INTRAMUSCULAR | Status: DC | PRN
Start: 2014-11-15 — End: 2014-11-21
  Administered 2014-11-18 – 2014-11-19 (×2): 4 mg via INTRAVENOUS
  Filled 2014-11-15 (×2): qty 2

## 2014-11-15 MED ORDER — ALUM & MAG HYDROXIDE-SIMETH 200-200-20 MG/5ML PO SUSP: 30.0000 mL | Freq: Four times a day (QID) | ORAL | Status: DC | PRN

## 2014-11-15 MED ORDER — PAROXETINE HCL 20 MG PO TABS
30.0000 mg | ORAL_TABLET | Freq: Every day | ORAL | Status: DC
  Administered 2014-11-16 – 2014-11-21 (×6): 30 mg via ORAL
  Filled 2014-11-15 (×6): qty 2

## 2014-11-15 MED ORDER — PIPERACILLIN-TAZOBACTAM 3.375 G IVPB 30 MIN
3.3750 g | Freq: Once | INTRAVENOUS | Status: AC
  Administered 2014-11-15: 3.375 g via INTRAVENOUS
  Filled 2014-11-15: qty 50

## 2014-11-15 MED ORDER — ACETAMINOPHEN 325 MG PO TABS
650.0000 mg | ORAL_TABLET | Freq: Four times a day (QID) | ORAL | Status: DC | PRN
  Administered 2014-11-18 – 2014-11-21 (×4): 650 mg via ORAL
  Filled 2014-11-15 (×4): qty 2

## 2014-11-15 MED ORDER — VANCOMYCIN HCL IN DEXTROSE 1-5 GM/200ML-% IV SOLN
1000.0000 mg | Freq: Once | INTRAVENOUS | Status: AC
Start: 2014-11-15 — End: 2014-11-16
  Administered 2014-11-16: 1000 mg via INTRAVENOUS
  Filled 2014-11-15: qty 200

## 2014-11-15 MED ORDER — ASPIRIN EC 81 MG PO TBEC
81.0000 mg | DELAYED_RELEASE_TABLET | Freq: Every day | ORAL | Status: DC
  Administered 2014-11-16 – 2014-11-21 (×6): 81 mg via ORAL
  Filled 2014-11-15 (×6): qty 1

## 2014-11-15 MED ORDER — PREDNISONE 10 MG PO TABS
5.0000 mg | ORAL_TABLET | Freq: Two times a day (BID) | ORAL | Status: DC
  Administered 2014-11-16 – 2014-11-21 (×12): 5 mg via ORAL
  Filled 2014-11-15 (×12): qty 1

## 2014-11-15 MED ORDER — ONDANSETRON HCL 4 MG PO TABS
4.0000 mg | ORAL_TABLET | Freq: Four times a day (QID) | ORAL | Status: DC | PRN
  Administered 2014-11-18: 4 mg via ORAL
  Filled 2014-11-15: qty 1

## 2014-11-15 MED ORDER — ENOXAPARIN SODIUM 30 MG/0.3ML ~~LOC~~ SOLN
30.0000 mg | SUBCUTANEOUS | Status: DC
Start: 2014-11-16 — End: 2014-11-21
  Administered 2014-11-16 – 2014-11-21 (×6): 30 mg via SUBCUTANEOUS
  Filled 2014-11-15 (×6): qty 0.3

## 2014-11-15 MED ORDER — FENOFIBRATE 160 MG PO TABS
160.0000 mg | ORAL_TABLET | Freq: Every day | ORAL | Status: DC
  Administered 2014-11-16 – 2014-11-20 (×5): 160 mg via ORAL
  Filled 2014-11-15 (×5): qty 1

## 2014-11-15 MED ORDER — IPRATROPIUM BROMIDE 0.02 % IN SOLN
0.5000 mg | RESPIRATORY_TRACT | Status: AC
  Administered 2014-11-15: 0.5 mg via RESPIRATORY_TRACT
  Filled 2014-11-15: qty 2.5

## 2014-11-15 MED ORDER — LEVOTHYROXINE SODIUM 25 MCG PO TABS
137.0000 ug | ORAL_TABLET | Freq: Every day | ORAL | Status: DC
  Administered 2014-11-16 – 2014-11-21 (×6): 137 ug via ORAL
  Filled 2014-11-15 (×14): qty 1

## 2014-11-15 MED ORDER — FERROUS SULFATE 325 (65 FE) MG PO TABS
325.0000 mg | ORAL_TABLET | Freq: Two times a day (BID) | ORAL | Status: DC
  Administered 2014-11-16 – 2014-11-21 (×12): 325 mg via ORAL
  Filled 2014-11-15 (×12): qty 1

## 2014-11-15 MED ORDER — AMLODIPINE BESYLATE 5 MG PO TABS
5.0000 mg | ORAL_TABLET | Freq: Every day | ORAL | Status: DC
  Administered 2014-11-16 – 2014-11-21 (×6): 5 mg via ORAL
  Filled 2014-11-15 (×6): qty 1

## 2014-11-15 MED ORDER — IPRATROPIUM-ALBUTEROL 0.5-2.5 (3) MG/3ML IN SOLN
3.0000 mL | RESPIRATORY_TRACT | Status: DC
  Administered 2014-11-16: 3 mL via RESPIRATORY_TRACT
  Filled 2014-11-15: qty 3

## 2014-11-15 MED ORDER — ALLOPURINOL 300 MG PO TABS
300.0000 mg | ORAL_TABLET | Freq: Every day | ORAL | Status: DC
  Administered 2014-11-16 – 2014-11-21 (×6): 300 mg via ORAL
  Filled 2014-11-15 (×6): qty 1

## 2014-11-15 MED ORDER — ACETAMINOPHEN 650 MG RE SUPP: 650.0000 mg | Freq: Four times a day (QID) | RECTAL | Status: DC | PRN

## 2014-11-15 MED ORDER — POTASSIUM CHLORIDE CRYS ER 10 MEQ PO TBCR
10.0000 meq | EXTENDED_RELEASE_TABLET | Freq: Every day | ORAL | Status: DC
  Administered 2014-11-16 – 2014-11-21 (×6): 10 meq via ORAL
  Filled 2014-11-15 (×7): qty 1

## 2014-11-15 NOTE — H&P (Signed)
[facility] Admission History and Physical       [name] HAL:[id number] DOB: Oct 26, 1934 DOA: 11/15/2014  Referring physician: EDP PCP: Mickie Hillier, MD  Specialists:   Chief Complaint: Fever Cough and SOB  HPI: [name] is a 80 y.o. female with a history of SCCa of Left Lung diagnosed 07/2013 S/P Chemo RX and Radiation Rx, COPD, HTN, Hypothyroid who presents to the ED with complaints of fever chills cough and increased SOB x 3 days.   She was found to have hypoxia with an O2 saturation of 86%.   She was placed on 4 liters of NCO2 and had improvement. A Chest X-ray revealed worsening Left Basilar opacities.  She was placed on IV Vancomycin and Zosyn and referred for medical admission.     Review of Systems:  Constitutional: No Weight Loss, No Weight Gain, Night Sweats, +Fevers, +Chills, Dizziness, Light Headedness, Fatigue, or Generalized Weakness HEENT: No Headaches, Difficulty Swallowing,Tooth/Dental Problems,Sore Throat,  No Sneezing, Rhinitis, Ear Ache, Nasal Congestion, or Post Nasal Drip,  Cardio-vascular:  No Chest pain, Orthopnea, PND, Edema in Lower Extremities, Anasarca, Dizziness, Palpitations  Resp:  +Dyspnea, No DOE, +Productive Cough, No Non-Productive Cough, No Hemoptysis, No Wheezing.    GI: No Heartburn, Indigestion, Abdominal Pain, Nausea, Vomiting, Diarrhea, Constipation, Hematemesis, Hematochezia, Melena, Change in Bowel Habits,  Loss of Appetite  GU: No Dysuria, No Change in Color of Urine, No Urgency or Urinary Frequency, No Flank pain.  Musculoskeletal: No Joint Pain or Swelling, No Decreased Range of Motion, No Back Pain.  Neurologic: No Syncope, No Seizures, Muscle Weakness, Paresthesia, Vision Disturbance or Loss, No Diplopia, No Vertigo, No Difficulty Walking,  Skin: No Rash or Lesions. Psych: No Change in Mood or Affect, No Depression or Anxiety, No Memory loss, No Confusion, or Hallucinations   Past Medical History  Diagnosis Date  .  Hypertension   . Hypothyroidism   . Hyperlipidemia   . Insomnia   . GERD (gastroesophageal reflux disease)     chronic gastritis  . Adrenal adenoma   . Chronic diarrhea   . Fatty liver   . Arthritis     knee  . QT prolongation     syncope  . Coronary artery disease   . Impaired fasting glucose   . Renal insufficiency     low protien diet  . Tobacco use     1/2 ppd, approx 25-50 pack years (as of 08/2012)  . Family history of heart disease   . IBS (irritable bowel syndrome)   . Peripheral arterial disease     sstatus post  infrarenal abdominal aortic tube graft placed by Dr. Victorino Dike February 2008  . Cancer     Lung  . Squamous cell carcinoma of left lung 07/29/2013     Past Surgical History  Procedure Laterality Date  . Abdominal hysterectomy    . Thyroidectomy, partial    . Colonoscopy  5/04    normal  . Abdominal aortic aneurysm repair  07/2006    D. J.D. Kellie Simmering  . Cataract extraction Bilateral 2010  . Cardiac catheterization  02/2010    mod CAD in L-dominant system with normal LV function  . Colon resection  2005    1/2 colon removed  . Transthoracic echocardiogram  02/2010    EF 55-60%; mild conc LVH, normal systolic function; mildly calcified AV annulus  . Colonoscopy with esophagogastroduodenoscopy (egd) N/A 03/19/2013    Procedure: COLONOSCOPY WITH ESOPHAGOGASTRODUODENOSCOPY (EGD);  Surgeon: Rogene Houston, MD;  Location: [facility];  Service: Endoscopy;  Laterality: N/A;  145  . Dg biopsy lung Left Jan 2015      Prior to Admission medications   Medication Sig Start Date End Date Taking? Authorizing Provider  allopurinol (ZYLOPRIM) 300 MG tablet Take 1 tablet (300 mg total) by mouth daily. 11/02/14  Yes Mikey Kirschner, MD  ALPRAZolam Duanne Moron) 1 MG tablet TAKE ONE-HALF TO ONE TABLET BY MOUTH THREE TIMES DAILY AS NEEDED FOR ANXIETY 08/19/14  Yes Mikey Kirschner, MD  amLODipine (NORVASC) 5 MG tablet take 1 tablet by mouth once daily 09/03/14  Yes Mikey Kirschner, MD  aspirin EC 81 MG tablet Take 81 mg by mouth daily.   Yes Historical Provider, MD  cyanocobalamin (,VITAMIN B-12,) 1000 MCG/ML injection Inject 1,000 mcg into the muscle once.   Yes Historical Provider, MD  diphenoxylate-atropine (LOMOTIL) 2.5-0.025 MG per tablet Take 1 tablet by mouth 4 (four) times daily as needed for diarrhea or loose stools. Patient taking differently: Take 1 tablet by mouth 4 (four) times daily.  07/20/14  Yes Manon Hilding Kefalas, PA-C  enalapril (VASOTEC) 10 MG tablet Take 1 tablet (10 mg total) by mouth daily. 07/22/14  Yes Mikey Kirschner, MD  esomeprazole (NEXIUM) 40 MG capsule take 1 capsule by mouth once daily 06/17/14  Yes Mikey Kirschner, MD  fenofibrate 160 MG tablet take 1 tablet by mouth once daily with food 11/09/14  Yes Mikey Kirschner, MD  ferrous sulfate (FERROUSUL) 325 (65 FE) MG tablet Take 1 tablet (325 mg total) by mouth 2 (two) times daily after a meal. 03/19/13  Yes Rogene Houston, MD  folic acid (FOLVITE) 1 MG tablet Take 1 mg by mouth daily. 07/14/14  Yes Historical Provider, MD  hydrALAZINE (APRESOLINE) 25 MG tablet take 1 tablet by mouth three times a day 10/20/14  Yes Mikey Kirschner, MD  levothyroxine (SYNTHROID, LEVOTHROID) 137 MCG tablet Take 1 tablet (137 mcg total) by mouth daily. 11/02/14  Yes Mikey Kirschner, MD  PARoxetine (PAXIL) 30 MG tablet take 1 tablet by mouth once daily 08/26/14  Yes Mikey Kirschner, MD  potassium chloride 20 MEQ TBCR Take 10 mEq by mouth daily. 12/15/13  Yes Farrel Gobble, MD  pravastatin (PRAVACHOL) 20 MG tablet take 1 tablet by mouth once daily 10/12/14  Yes Mikey Kirschner, MD  predniSONE (DELTASONE) 5 MG tablet Take 1 tablet (5 mg total) by mouth 2 (two) times daily with a meal. 10/20/14  Yes Manon Hilding Kefalas, PA-C  SPIRIVA HANDIHALER 18 MCG inhalation capsule Place 18 mcg into inhaler and inhale daily.  11/14/14  Yes Historical Provider, MD  temazepam (RESTORIL) 30 MG capsule Take 1 capsule (30 mg total) by  mouth at bedtime as needed for sleep. 07/20/14  Yes Baird Cancer, PA-C  traMADol (ULTRAM) 50 MG tablet Take 1 tablet (50 mg total) by mouth every 6 (six) hours as needed (pain). 09/23/14  Yes Mikey Kirschner, MD  Vitamin D, Ergocalciferol, (DRISDOL) 50000 UNITS CAPS capsule Take 50,000 Units by mouth once a week. 07/14/14  Yes Historical Provider, MD     Allergies  Allergen Reactions  . Aleve [Naproxen Sodium] Other (See Comments)    GI bleed  . Dilaudid [Hydromorphone Hcl]     Can not tolerate  . Dyazide [Hydrochlorothiazide W-Triamterene] Other (See Comments)    cramps  . Zithromax [Azithromycin] Itching    Social History:  reports that she has quit smoking. Her smoking use included  Cigarettes. She has a 29.5 pack-year smoking history. She has never used smokeless tobacco. She reports that she does not drink alcohol or use illicit drugs.    Family History  Problem Relation Age of Onset  . Hypertension Mother   . Diabetes Mother   . Heart attack Mother   . Hyperlipidemia Sister     x3 sister  . Kidney disease Brother        Physical Exam:  GEN:  Pleasant Well Nourished and Well Developed 80 y.o. African American female examined and in no acute distress; cooperative with exam Filed Vitals:   11/15/14 2130 11/15/14 2133 11/15/14 2148 11/15/14 2200  BP: 142/61  142/61 147/71  Pulse: 83  87 89  Temp:      TempSrc:      Resp: 32  22 17  Height:      Weight:      SpO2: 95% 95% 100% 97%   Blood pressure 147/71, pulse 89, temperature 100.1 F (37.8 C), temperature source Oral, resp. rate 17, height '5\' 7"'$  (1.702 m), weight 80.74 kg (178 lb), SpO2 97 %. PSYCH: She is alert and oriented x4; does not appear anxious does not appear depressed; affect is normal HEENT: Normocephalic and Atraumatic, Mucous membranes pink; PERRLA; EOM intact; Fundi:  Benign;  No scleral icterus, Nares: Patent, Oropharynx: Clear, Fair Dentition,    Neck:  FROM, No Cervical Lymphadenopathy nor  Thyromegaly or Carotid Bruit; No JVD; Breasts:: Not examined CHEST WALL: No tenderness CHEST: Normal respiration, clear to auscultation bilaterally HEART: Regular rate and rhythm; no murmurs rubs or gallops BACK: No kyphosis or scoliosis; No CVA tenderness ABDOMEN: Positive Bowel Sounds, Obese, Soft Non-Tender, No Rebound or Guarding; No Masses, No Organomegaly Rectal Exam: Not done EXTREMITIES: No Cyanosis, Clubbing, or Edema; No Ulcerations. Genitalia: not examined PULSES: 2+ and symmetric SKIN: Normal hydration no rash or ulceration CNS:  Alert and Oriented x 4, No Focal Deficits Vascular: pulses palpable throughout    Labs on Admission:  Basic Metabolic Panel:  Recent Labs Lab 11/15/14 2105 11/15/14 2132  NA 137 138  K 3.3* 3.4*  CL 105 103  CO2 23  --   GLUCOSE 163* 162*  BUN 15 15  CREATININE 2.13* 2.20*  CALCIUM 8.9  --    Liver Function Tests: No results for input(s): AST, ALT, ALKPHOS, BILITOT, PROT, ALBUMIN in the last 168 hours. No results for input(s): LIPASE, AMYLASE in the last 168 hours. No results for input(s): AMMONIA in the last 168 hours. CBC:  Recent Labs Lab 11/15/14 2105 11/15/14 2132  WBC 6.1  --   NEUTROABS 4.2  --   HGB 10.9* 11.9*  HCT 34.0* 35.0*  MCV 103.7*  --   PLT 201  --    Cardiac Enzymes:  Recent Labs Lab 11/15/14 2105  TROPONINI 0.09*    BNP (last 3 results) No results for input(s): BNP in the last 8760 hours.  ProBNP (last 3 results) No results for input(s): PROBNP in the last 8760 hours.  CBG: No results for input(s): GLUCAP in the last 168 hours.  Radiological Exams on Admission: Dg Chest Port 1 View  11/15/2014   CLINICAL DATA:  Dizziness, shortness of breath. History of lung cancer.  EXAM: PORTABLE CHEST - 1 VIEW  COMPARISON:  02/24/2014; chest CT - 08/27/2014  FINDINGS: Lung volumes are reduced. Grossly unchanged enlarged cardiac silhouette and mediastinal contours with atherosclerotic plaque within the  thoracic aorta. Worsening left basilar heterogeneous/consolidative opacities. Pulmonary is congestion without  frank evidence of edema. Trace left-sided effusion is not excluded. No pneumothorax. Stable position of support apparatus. No acute osseus abnormalities.  IMPRESSION: Worsening left basilar opacities, atelectasis versus infiltrate, potentially accentuated due to decreased lung volumes. Further evaluation with a PA and lateral chest radiograph may be obtained as clinically indicated.   Electronically Signed   By: Sandi Mariscal M.D.   On: 11/15/2014 21:27     EKG: Independently reviewed. Normal Sinus Rhythm rate =85 + Right Axis Deviation, Old Anterior Infarct Changes    Assessment/Plan:   80 y.o. female with  Principal Problem:   1.     HCAP (healthcare-associated pneumonia)   IV VAncomycin and Zosyn   IVFs   DuoNebs   Active Problems:   2.    Hypoxia   NCO2 PRN `  Monitor O2 sats     3.    COPD, moderate   Duonebs   Continue Oral Steroids     4.    AKI (acute kidney injury)   Hold Enalapril Rx   Gentle IVFs   Monitor BUN/Cr     5.    Chronic renal insufficiency   BUN/Cr on 10/20/2014 was 27 / 1.79        6.    Coronary artery disease   stable     7.    Essential hypertension   On Enalapril, Amlodipine, and Hydralazine Rx   Monitor BPs     8.    Anemia, iron deficiency   Continue Iron Rx      9.    Squamous cell carcinoma of left lung   On surveillance with C scans every 3 months   10.    Generalized anxiety disorder   On Alprazolam TID PRN   11.    DVT Prophylaxis      Lovenox     Code Status:     FULL CODE        Family Communication:   Daughters at Bedside    Disposition Plan:    Inpatient  Status        Time spent:  71 Minutes      Theressa Millard [facility] Pager [phone]   If [place] Please Contact the Day Rounding Team MD for [facility]  If 7PM-7AM, Please Contact Night-Floor Coverage   [email or web address] Password [email or web address] 11/15/2014, 10:30 PM     ADDENDUM:   Patient was seen and examined on 11/15/2014

## 2014-11-15 NOTE — ED Provider Notes (Signed)
CSN: [id number]     Arrival date & time 11/15/14  2029 History   First MD Initiated Contact with Patient 11/15/14 2038     Chief Complaint  Patient presents with  . Shortness of Breath     (Consider location/radiation/quality/duration/timing/severity/associated sxs/prior Treatment) HPI Comments: The patient is a 80 year old female, she has a history of iron deficiency anemia, history of lung cancer status post radiation and chemotherapy, found several months ago to be free of any active cancer based on CT scans of the chest abdomen pelvis and an MRI of the brain. She also has a history of hypertension, recent urinary tract infection and COPD. She has been seen and treated by her family doctor for a history of chronic dizziness, no etiology has been found but is described as vertigo.  She presents to the hospital today with a complaint of shortness of breath.  She states that she has had increasing shortness of breath over the last several weeks, she has increased cough which is productive of phlegm, last night she was having multiple episodes of nausea and vomiting associated with her shortness of breath. This is persistent, gradually worsening and is now become severe. She is not on home oxygen and never has been. She has not been using any treatments for wheezing. She denies any swelling of the lower extremities, there has been no recent travel trauma or immobilization and no recent surgery. She did smoke cigarettes many years ago.  Patient is a 80 y.o. female presenting with shortness of breath. The history is provided by the patient, a relative and medical records.  Shortness of Breath   Past Medical History  Diagnosis Date  . Hypertension   . Hypothyroidism   . Hyperlipidemia   . Insomnia   . GERD (gastroesophageal reflux disease)     chronic gastritis  . Adrenal adenoma   . Chronic diarrhea   . Fatty liver   . Arthritis     knee  . QT prolongation     syncope  . Coronary artery  disease   . Impaired fasting glucose   . Renal insufficiency     low protien diet  . Tobacco use     1/2 ppd, approx 25-50 pack years (as of 08/2012)  . Family history of heart disease   . IBS (irritable bowel syndrome)   . Peripheral arterial disease     sstatus post  infrarenal abdominal aortic tube graft placed by Dr. Victorino Dike February 2008  . Cancer     Lung  . Squamous cell carcinoma of left lung 07/29/2013  . HCAP (healthcare-associated pneumonia)     10/2014  . Acute respiratory failure     10/2014  . COPD (chronic obstructive pulmonary disease)     moderat   Past Surgical History  Procedure Laterality Date  . Abdominal hysterectomy    . Thyroidectomy, partial    . Colonoscopy  5/04    normal  . Abdominal aortic aneurysm repair  07/2006    D. J.D. Kellie Simmering  . Cataract extraction Bilateral 2010  . Cardiac catheterization  02/2010    mod CAD in L-dominant system with normal LV function  . Colon resection  2005    1/2 colon removed  . Transthoracic echocardiogram  02/2010    EF 55-60%; mild conc LVH, normal systolic function; mildly calcified AV annulus  . Colonoscopy with esophagogastroduodenoscopy (egd) N/A 03/19/2013    Procedure: COLONOSCOPY WITH ESOPHAGOGASTRODUODENOSCOPY (EGD);  Surgeon: Rogene Houston, MD;  Location:  [facility];  Service: Endoscopy;  Laterality: N/A;  145  . Dg biopsy lung Left Jan 2015   Family History  Problem Relation Age of Onset  . Hypertension Mother   . Diabetes Mother   . Heart attack Mother   . Hyperlipidemia Sister     x3 sister  . Kidney disease Brother    History  Substance Use Topics  . Smoking status: Former Smoker -- 0.50 packs/day for 59 years    Types: Cigarettes  . Smokeless tobacco: Never Used     Comment: smoking since age [age]  . Alcohol Use: No   OB History    No data available     Review of Systems  Respiratory: Positive for shortness of breath.   All other systems reviewed and are negative.     Allergies   Aleve; Dilaudid; Dyazide; and Zithromax  Home Medications   Prior to Admission medications   Medication Sig Start Date End Date Taking? Authorizing Provider  allopurinol (ZYLOPRIM) 300 MG tablet Take 1 tablet (300 mg total) by mouth daily. 11/02/14  Yes Mikey Kirschner, MD  ALPRAZolam Duanne Moron) 1 MG tablet TAKE ONE-HALF TO ONE TABLET BY MOUTH THREE TIMES DAILY AS NEEDED FOR ANXIETY 08/19/14  Yes Mikey Kirschner, MD  amLODipine (NORVASC) 5 MG tablet take 1 tablet by mouth once daily 09/03/14  Yes Mikey Kirschner, MD  aspirin EC 81 MG tablet Take 81 mg by mouth daily.   Yes Historical Provider, MD  cyanocobalamin (,VITAMIN B-12,) 1000 MCG/ML injection Inject 1,000 mcg into the muscle once.   Yes Historical Provider, MD  diphenoxylate-atropine (LOMOTIL) 2.5-0.025 MG per tablet Take 1 tablet by mouth 4 (four) times daily as needed for diarrhea or loose stools. Patient taking differently: Take 1 tablet by mouth 4 (four) times daily.  07/20/14  Yes Manon Hilding Kefalas, PA-C  enalapril (VASOTEC) 10 MG tablet Take 1 tablet (10 mg total) by mouth daily. 07/22/14  Yes Mikey Kirschner, MD  esomeprazole (NEXIUM) 40 MG capsule take 1 capsule by mouth once daily 06/17/14  Yes Mikey Kirschner, MD  fenofibrate 160 MG tablet take 1 tablet by mouth once daily with food 11/09/14  Yes Mikey Kirschner, MD  ferrous sulfate (FERROUSUL) 325 (65 FE) MG tablet Take 1 tablet (325 mg total) by mouth 2 (two) times daily after a meal. 03/19/13  Yes Rogene Houston, MD  folic acid (FOLVITE) 1 MG tablet Take 1 mg by mouth daily. 07/14/14  Yes Historical Provider, MD  hydrALAZINE (APRESOLINE) 25 MG tablet take 1 tablet by mouth three times a day 10/20/14  Yes Mikey Kirschner, MD  levothyroxine (SYNTHROID, LEVOTHROID) 137 MCG tablet Take 1 tablet (137 mcg total) by mouth daily. 11/02/14  Yes Mikey Kirschner, MD  PARoxetine (PAXIL) 30 MG tablet take 1 tablet by mouth once daily 08/26/14  Yes Mikey Kirschner, MD  potassium chloride 20 MEQ  TBCR Take 10 mEq by mouth daily. 12/15/13  Yes Farrel Gobble, MD  pravastatin (PRAVACHOL) 20 MG tablet take 1 tablet by mouth once daily 10/12/14  Yes Mikey Kirschner, MD  predniSONE (DELTASONE) 5 MG tablet Take 1 tablet (5 mg total) by mouth 2 (two) times daily with a meal. 10/20/14  Yes Manon Hilding Kefalas, PA-C  SPIRIVA HANDIHALER 18 MCG inhalation capsule Place 18 mcg into inhaler and inhale daily.  11/14/14  Yes Historical Provider, MD  temazepam (RESTORIL) 30 MG capsule Take 1 capsule (30 mg total) by  mouth at bedtime as needed for sleep. 07/20/14  Yes Baird Cancer, PA-C  traMADol (ULTRAM) 50 MG tablet Take 1 tablet (50 mg total) by mouth every 6 (six) hours as needed (pain). 09/23/14  Yes Mikey Kirschner, MD  Vitamin D, Ergocalciferol, (DRISDOL) 50000 UNITS CAPS capsule Take 50,000 Units by mouth once a week. 07/14/14  Yes Historical Provider, MD   BP 145/64 mmHg  Pulse 88  Temp(Src) 98.6 F (37 C) (Oral)  Resp 20  Ht '5\' 7"'$  (1.702 m)  Wt 178 lb 8 oz (80.967 kg)  BMI 27.95 kg/m2  SpO2 90% Physical Exam  Constitutional: She appears well-developed and well-nourished. She appears distressed.  HENT:  Head: Normocephalic and atraumatic.  Mouth/Throat: Oropharynx is clear and moist. No oropharyngeal exudate.  Eyes: Conjunctivae and EOM are normal. Pupils are equal, round, and reactive to light. Right eye exhibits no discharge. Left eye exhibits no discharge. No scleral icterus.  Neck: Normal range of motion. Neck supple. No JVD present. No thyromegaly present.  Cardiovascular: Normal rate, regular rhythm, normal heart sounds and intact distal pulses.  Exam reveals no gallop and no friction rub.   No murmur heard. Pulmonary/Chest: She is in respiratory distress. She has wheezes (expiratory wheezing, prolonged expiratory phase). She has no rales.  Abdominal: Soft. Bowel sounds are normal. She exhibits no distension and no mass. There is no tenderness.  Musculoskeletal: Normal range of  motion. She exhibits no edema or tenderness.  Lymphadenopathy:    She has no cervical adenopathy.  Neurological: She is alert. Coordination normal.  Skin: Skin is warm and dry. No rash noted. No erythema.  Psychiatric: She has a normal mood and affect. Her behavior is normal.  Nursing note and vitals reviewed.   ED Course  Procedures (including critical care time) Labs Review Labs Reviewed  CBC WITH DIFFERENTIAL/PLATELET - Abnormal; Notable for the following:    RBC 3.28 (*)    Hemoglobin 10.9 (*)    HCT 34.0 (*)    MCV 103.7 (*)    Monocytes Relative 13 (*)    All other components within normal limits  BASIC METABOLIC PANEL - Abnormal; Notable for the following:    Potassium 3.3 (*)    Glucose, Bld 163 (*)    Creatinine, Ser 2.13 (*)    GFR calc non Af Amer 21 (*)    GFR calc Af Amer 24 (*)    All other components within normal limits  TROPONIN I - Abnormal; Notable for the following:    Troponin I 0.09 (*)    All other components within normal limits  BRAIN NATRIURETIC PEPTIDE - Abnormal; Notable for the following:    B Natriuretic Peptide 280.0 (*)    All other components within normal limits  BASIC METABOLIC PANEL - Abnormal; Notable for the following:    Potassium 3.4 (*)    Glucose, Bld 150 (*)    Creatinine, Ser 1.86 (*)    Calcium 8.8 (*)    GFR calc non Af Amer 25 (*)    GFR calc Af Amer 29 (*)    All other components within normal limits  CBC - Abnormal; Notable for the following:    RBC 3.21 (*)    Hemoglobin 10.6 (*)    HCT 33.2 (*)    MCV 103.4 (*)    Platelets 137 (*)    All other components within normal limits  I-STAT CHEM 8, ED - Abnormal; Notable for the following:    Potassium 3.4 (*)  Creatinine, Ser 2.20 (*)    Glucose, Bld 162 (*)    Hemoglobin 11.9 (*)    HCT 35.0 (*)    All other components within normal limits  CULTURE, BLOOD (ROUTINE X 2)  CULTURE, BLOOD (ROUTINE X 2)  CULTURE, EXPECTORATED SPUTUM-ASSESSMENT  GRAM STAIN   URINALYSIS, ROUTINE W REFLEX MICROSCOPIC  LEGIONELLA ANTIGEN, URINE  STREP PNEUMONIAE URINARY ANTIGEN  I-STAT TROPOININ, ED  I-STAT CG4 LACTIC ACID, ED  I-STAT CG4 LACTIC ACID, ED    Imaging Review Dg Chest Port 1 View  11/15/2014   CLINICAL DATA:  Dizziness, shortness of breath. History of lung cancer.  EXAM: PORTABLE CHEST - 1 VIEW  COMPARISON:  02/24/2014; chest CT - 08/27/2014  FINDINGS: Lung volumes are reduced. Grossly unchanged enlarged cardiac silhouette and mediastinal contours with atherosclerotic plaque within the thoracic aorta. Worsening left basilar heterogeneous/consolidative opacities. Pulmonary is congestion without frank evidence of edema. Trace left-sided effusion is not excluded. No pneumothorax. Stable position of support apparatus. No acute osseus abnormalities.  IMPRESSION: Worsening left basilar opacities, atelectasis versus infiltrate, potentially accentuated due to decreased lung volumes. Further evaluation with a PA and lateral chest radiograph may be obtained as clinically indicated.   Electronically Signed   By: Sandi Mariscal M.D.   On: 11/15/2014 21:27     EKG Interpretation   Date/Time:  Sunday Nov 15 2014 21:23:13 EDT Ventricular Rate:  85 PR Interval:  164 QRS Duration: 77 QT Interval:  376 QTC Calculation: 447 R Axis:   100 Text Interpretation:  Sinus rhythm Left atrial enlargement Right axis  deviation Anteroseptal infarct, old Borderline repolarization abnormality  Confirmed by ZAMMIT  MD, JOSEPH [phone]) on 11/15/2014 9:59:33 PM      MDM   Final diagnoses:  SOB (shortness of breath)  Healthcare-associated pneumonia    The patient has increased work of breathing, speaks in 1-2 word sentences, oxygenation is between 68 at 86% depending on room air versus 2 L of nasal cannula. On 3 L of nasal cannula her oxygen improved to 92%. She has no peripheral edema, I would consider multiple etiologies for her shortness of breath including but not limited to  COPD exacerbation, pneumonia, pneumothorax, pulmonary embolism.  At change of shift - care signed out to Dr. Roderic Palau.  Anticipate admission due to hypoxia  Noemi Chapel, MD 11/16/14 1459

## 2014-11-15 NOTE — ED Notes (Signed)
Pt states she has been falling down a lot; pt c/o sob

## 2014-11-16 ENCOUNTER — Encounter (HOSPITAL_COMMUNITY): Payer: Self-pay | Admitting: Internal Medicine

## 2014-11-16 DIAGNOSIS — J9601 Acute respiratory failure with hypoxia: Secondary | ICD-10-CM | POA: Diagnosis present

## 2014-11-16 DIAGNOSIS — J449 Chronic obstructive pulmonary disease, unspecified: Secondary | ICD-10-CM

## 2014-11-16 LAB — BASIC METABOLIC PANEL
Anion gap: 9 (ref 5–15)
BUN: 14 mg/dL (ref 6–20)
CHLORIDE: 107 mmol/L (ref 101–111)
CO2: 22 mmol/L (ref 22–32)
Calcium: 8.8 mg/dL — ABNORMAL LOW (ref 8.9–10.3)
Creatinine, Ser: 1.86 mg/dL — ABNORMAL HIGH (ref 0.44–1.00)
GFR calc Af Amer: 29 mL/min — ABNORMAL LOW (ref >60)
GFR calc non Af Amer: 25 mL/min — ABNORMAL LOW (ref >60)
GLUCOSE: 150 mg/dL — AB (ref 65–99)
POTASSIUM: 3.4 mmol/L — AB (ref 3.5–5.1)
SODIUM: 138 mmol/L (ref 135–145)

## 2014-11-16 LAB — URINALYSIS, ROUTINE W REFLEX MICROSCOPIC
BILIRUBIN URINE: NEGATIVE
Glucose, UA: NEGATIVE mg/dL
Hgb urine dipstick: NEGATIVE
Ketones, ur: NEGATIVE mg/dL
Leukocytes, UA: NEGATIVE
NITRITE: NEGATIVE
PH: 5.5 (ref 5.0–8.0)
Protein, ur: NEGATIVE mg/dL
SPECIFIC GRAVITY, URINE: 1.02 (ref 1.005–1.030)
Urobilinogen, UA: 0.2 mg/dL (ref 0.0–1.0)

## 2014-11-16 LAB — CBC
HEMATOCRIT: 33.2 % — AB (ref 36.0–46.0)
Hemoglobin: 10.6 g/dL — ABNORMAL LOW (ref 12.0–15.0)
MCH: 33 pg (ref 26.0–34.0)
MCHC: 31.9 g/dL (ref 30.0–36.0)
MCV: 103.4 fL — ABNORMAL HIGH (ref 78.0–100.0)
Platelets: 137 10*3/uL — ABNORMAL LOW (ref 150–400)
RBC: 3.21 MIL/uL — ABNORMAL LOW (ref 3.87–5.11)
RDW: 14.9 % (ref 11.5–15.5)
WBC: 5.5 10*3/uL (ref 4.0–10.5)

## 2014-11-16 LAB — STREP PNEUMONIAE URINARY ANTIGEN: Strep Pneumo Urinary Antigen: NEGATIVE

## 2014-11-16 MED ORDER — VANCOMYCIN HCL IN DEXTROSE 1-5 GM/200ML-% IV SOLN
1000.0000 mg | INTRAVENOUS | Status: DC
  Administered 2014-11-17 – 2014-11-21 (×4): 1000 mg via INTRAVENOUS
  Filled 2014-11-16 (×7): qty 200

## 2014-11-16 MED ORDER — IPRATROPIUM-ALBUTEROL 0.5-2.5 (3) MG/3ML IN SOLN
3.0000 mL | RESPIRATORY_TRACT | Status: DC
  Administered 2014-11-16 – 2014-11-17 (×7): 3 mL via RESPIRATORY_TRACT
  Filled 2014-11-16 (×8): qty 3

## 2014-11-16 MED ORDER — VANCOMYCIN HCL 500 MG IV SOLR
500.0000 mg | Freq: Once | INTRAVENOUS | Status: AC
  Administered 2014-11-16: 500 mg via INTRAVENOUS
  Filled 2014-11-16: qty 500

## 2014-11-16 MED ORDER — IPRATROPIUM-ALBUTEROL 0.5-2.5 (3) MG/3ML IN SOLN
3.0000 mL | Freq: Four times a day (QID) | RESPIRATORY_TRACT | Status: DC
  Administered 2014-11-16: 3 mL via RESPIRATORY_TRACT
  Filled 2014-11-16: qty 3

## 2014-11-16 MED ORDER — LIDOCAINE-PRILOCAINE 2.5-2.5 % EX CREA
TOPICAL_CREAM | Freq: Once | CUTANEOUS | Status: AC
  Administered 2014-11-16: 1 via TOPICAL
  Filled 2014-11-16: qty 5

## 2014-11-16 MED ORDER — PIPERACILLIN-TAZOBACTAM 3.375 G IVPB
3.3750 g | Freq: Once | INTRAVENOUS | Status: AC
  Administered 2014-11-16: 3.375 g via INTRAVENOUS
  Filled 2014-11-16: qty 50

## 2014-11-16 MED ORDER — PIPERACILLIN-TAZOBACTAM 3.375 G IVPB
INTRAVENOUS | Status: AC
  Filled 2014-11-16: qty 50

## 2014-11-16 MED ORDER — PIPERACILLIN-TAZOBACTAM 3.375 G IVPB
3.3750 g | Freq: Three times a day (TID) | INTRAVENOUS | Status: DC
  Administered 2014-11-16 – 2014-11-21 (×16): 3.375 g via INTRAVENOUS
  Filled 2014-11-16 (×22): qty 50

## 2014-11-16 NOTE — Progress Notes (Signed)
Notified MD that pt has a port. Per patient and daughter's request of access of port. Awaiting response.

## 2014-11-16 NOTE — Progress Notes (Signed)
[facility] PROGRESS NOTE  [name] YHC:[id number] DOB: 1935/05/02 DOA: 11/15/2014 PCP: Mickie Hillier, MD  Assessment/Plan:  Acute respiratory failure with hypoxia: related to HCAP in setting of SCCa s/p chemo, radiation. No improvement this am. Requiring 4L  to maintain sats 95%. Chest xray yesterday worsening left basilar opacities, atelectasis versus infiltrate, potentially accentuated due to decreased lung volumes. Continue nebs but increase to every 4 hours and continue steroids. Monitor closely clinically indicated.  Active Problems:  HCAP (healthcare-associated pneumonia): in patient with hx SCCa. vanc and zosyn day #2. She is febrile and hemodynamically stable. Strep pneumo urine antigen and legionella urine antigen in process. Continue supportive therapy. Monitor   AKI on chronic renal failure (acute kidney injury)stage II- chart review indicated baseline creatinine 1.5-1.9. Current creatinine close to baseline. Will hold nephrotoxins and continue gently IV hydration. Monitor urine output. Recheck in am    COPD, moderate: diminished BS with faint wheeze. Continue nebs and steroids. See above   Essential hypertension: fair control. Holding home ACE inhibitor. Monitor    Anemia, iron deficiency: followed by heme/onc. Stable    Squamous cell carcinoma of left lung: chart review indicates in remission. To be seen 02/2015 for 6 month check     Generalized anxiety disorder: stable at baseline    Coronary artery disease: no chest pain. Continue home meds      Code Status: full Family Communication: none presetn Disposition Plan: home when ready likely 48 hours   Consultants:  none  Procedures:  none  Antibiotics:  Vancomycin 11/15/14>>  Zosyn 11/15/14>>  HPI/Subjective: Lying bed reports feeling "some better" but complains of continued sob.   Objective: Filed Vitals:   11/16/14 0651  BP: 145/64  Pulse: 88  Temp: 98.6 F (37 C)  Resp: 20     Intake/Output Summary (Last 24 hours) at 11/16/14 0931 Last data filed at 11/16/14 0617  Gross per 24 hour  Intake 516.25 ml  Output    200 ml  Net 316.25 ml   Filed Weights   11/15/14 2032 11/15/14 2358  Weight: 80.74 kg (178 lb) 80.967 kg (178 lb 8 oz)    Exam:   General:  Well nourished appears comfortable  Cardiovascular: RRR no MGR no LE edema  Respiratory: mild increased work of breathing with conversation. BS quite diminished throughout with faint expiratory wheeze in bases.  Abdomen: obese soft +BS non-tender to palpation  Musculoskeletal: no clubbing or cyanosis   Data Reviewed: Basic Metabolic Panel:  Recent Labs Lab 11/15/14 2105 11/15/14 2132 11/16/14 0628  NA 137 138 138  K 3.3* 3.4* 3.4*  CL 105 103 107  CO2 23  --  22  GLUCOSE 163* 162* 150*  BUN '15 15 14  '$ CREATININE 2.13* 2.20* 1.86*  CALCIUM 8.9  --  8.8*   Liver Function Tests: No results for input(s): AST, ALT, ALKPHOS, BILITOT, PROT, ALBUMIN in the last 168 hours. No results for input(s): LIPASE, AMYLASE in the last 168 hours. No results for input(s): AMMONIA in the last 168 hours. CBC:  Recent Labs Lab 11/15/14 2105 11/15/14 2132 11/16/14 0628  WBC 6.1  --  5.5  NEUTROABS 4.2  --   --   HGB 10.9* 11.9* 10.6*  HCT 34.0* 35.0* 33.2*  MCV 103.7*  --  103.4*  PLT 201  --  137*   Cardiac Enzymes:  Recent Labs Lab 11/15/14 2105  TROPONINI 0.09*   BNP (last 3 results)  Recent Labs  11/15/14 2043  BNP 280.0*  ProBNP (last 3 results) No results for input(s): PROBNP in the last 8760 hours.  CBG: No results for input(s): GLUCAP in the last 168 hours.  Recent Results (from the past 240 hour(s))  Culture, blood (routine x 2)     Status: None (Preliminary result)   Collection Time: 11/15/14  8:56 PM  Result Value Ref Range Status   Specimen Description RIGHT ANTECUBITAL  Final   Special Requests BOTTLES DRAWN AEROBIC AND ANAEROBIC 6CC  Final   Culture PENDING   Incomplete   Report Status PENDING  Incomplete  Culture, blood (routine x 2)     Status: None (Preliminary result)   Collection Time: 11/15/14  9:05 PM  Result Value Ref Range Status   Specimen Description LEFT ANTECUBITAL  Final   Special Requests   Final    BOTTLES DRAWN AEROBIC AND ANAEROBIC 6CC DRAWN BY RN   Culture PENDING  Incomplete   Report Status PENDING  Incomplete     Studies: Dg Chest Port 1 View  11/15/2014   CLINICAL DATA:  Dizziness, shortness of breath. History of lung cancer.  EXAM: PORTABLE CHEST - 1 VIEW  COMPARISON:  02/24/2014; chest CT - 08/27/2014  FINDINGS: Lung volumes are reduced. Grossly unchanged enlarged cardiac silhouette and mediastinal contours with atherosclerotic plaque within the thoracic aorta. Worsening left basilar heterogeneous/consolidative opacities. Pulmonary is congestion without frank evidence of edema. Trace left-sided effusion is not excluded. No pneumothorax. Stable position of support apparatus. No acute osseus abnormalities.  IMPRESSION: Worsening left basilar opacities, atelectasis versus infiltrate, potentially accentuated due to decreased lung volumes. Further evaluation with a PA and lateral chest radiograph may be obtained as clinically indicated.   Electronically Signed   By: Sandi Mariscal M.D.   On: 11/15/2014 21:27    Scheduled Meds: . allopurinol  300 mg Oral Daily  . amLODipine  5 mg Oral Daily  . aspirin EC  81 mg Oral Daily  . enoxaparin (LOVENOX) injection  30 mg Subcutaneous Q24H  . fenofibrate  160 mg Oral Daily  . ferrous sulfate  325 mg Oral BID PC  . folic acid  1 mg Oral Daily  . hydrALAZINE  25 mg Oral TID  . ipratropium-albuterol  3 mL Nebulization Q4H  . levothyroxine  137 mcg Oral QAC breakfast  . pantoprazole  40 mg Oral Daily  . PARoxetine  30 mg Oral Daily  . piperacillin-tazobactam (ZOSYN)  IV  3.375 g Intravenous Once  . piperacillin-tazobactam (ZOSYN)  IV  3.375 g Intravenous Q8H  . potassium chloride  10 mEq  Oral Daily  . pravastatin  20 mg Oral Daily  . predniSONE  5 mg Oral BID WC  . vancomycin  500 mg Intravenous Once  . [START ON 11/17/2014] vancomycin  1,000 mg Intravenous Q24H   Continuous Infusions: . sodium chloride 75 mL/hr at 11/16/14 0004    Principal Problem:    Time spent: 35 minutes    [place] [facility] Pager [phone]. If 7PM-7AM, please contact night-coverage at [email or web address], password [facility] 11/16/2014, 9:31 AM  LOS: 1 day

## 2014-11-16 NOTE — Progress Notes (Signed)
UR chart review completed.  

## 2014-11-16 NOTE — Care Management Note (Signed)
Case Management Note  Patient Details  Name: [name] MRN: [id number] Date of Birth: 1935-06-24  Subjective/Objective:                  Pt admitted from home with pneumonia. Pt lives at home but has 2 daughters that take turns staying with pt. Pt always has some one with her around the clock. Pt has been fairly independent with ADL's.   Action/Plan: Cm did discuss with pt and her daughter [place] options and private duty agency help. List given to pts daughter for private duty agency assistance. Cm also discussed with pt medicaid qualifications and CAP program. Pt will need home O2 assessment prior to discharge. Will continue to follow for discharge planning needs.  Expected Discharge Date:                  Expected Discharge Plan:  [place]  In-House Referral:  NA  Discharge planning Services  CM Consult  Post Acute Care Choice:    Choice offered to:     DME Arranged:    DME Agency:     HH Arranged:    HH Agency:     Status of Service:  In process, will continue to follow  Medicare Important Message Given:    Date Medicare IM Given:    Medicare IM give by:    Date Additional Medicare IM Given:    Additional Medicare Important Message give by:     If discussed at [place] of Stay Meetings, dates discussed:    Additional Comments:  Joylene Draft, RN 11/16/2014, 3:28 PM

## 2014-11-16 NOTE — Progress Notes (Signed)
ANTIBIOTIC CONSULT NOTE - INITIAL  Pharmacy Consult for Vancomycin and Zosyn Indication: pneumonia  Allergies  Allergen Reactions  . Aleve [Naproxen Sodium] Other (See Comments)    GI bleed  . Dilaudid [Hydromorphone Hcl]     Can not tolerate  . Dyazide [Hydrochlorothiazide W-Triamterene] Other (See Comments)    cramps  . Zithromax [Azithromycin] Itching   Patient Measurements: Height: '5\' 7"'$  (170.2 cm) Weight: 178 lb 8 oz (80.967 kg) IBW/kg (Calculated) : 61.6  Vital Signs: Temp: 98.6 F (37 C) (05/23 0651) Temp Source: Oral (05/23 0651) BP: 145/64 mmHg (05/23 0651) Pulse Rate: 88 (05/23 0651) Intake/Output from previous day: 05/22 0701 - 05/23 0700 In: 516.3 [I.V.:466.3; IV Piggyback:50] Out: 200 [Urine:200] Intake/Output from this shift:    Labs:  Recent Labs  11/15/14 2105 11/15/14 2132 11/16/14 0628  WBC 6.1  --  5.5  HGB 10.9* 11.9* 10.6*  PLT 201  --  137*  CREATININE 2.13* 2.20* 1.86*   Estimated Creatinine Clearance: 27.3 mL/min (by C-G formula based on Cr of 1.86). No results for input(s): VANCOTROUGH, VANCOPEAK, VANCORANDOM, GENTTROUGH, GENTPEAK, GENTRANDOM, TOBRATROUGH, TOBRAPEAK, TOBRARND, AMIKACINPEAK, AMIKACINTROU, AMIKACIN in the last 72 hours.   Microbiology: Recent Results (from the past 720 hour(s))  Culture, blood (routine x 2)     Status: None (Preliminary result)   Collection Time: 11/15/14  8:56 PM  Result Value Ref Range Status   Specimen Description RIGHT ANTECUBITAL  Final   Special Requests BOTTLES DRAWN AEROBIC AND ANAEROBIC 6CC  Final   Culture PENDING  Incomplete   Report Status PENDING  Incomplete  Culture, blood (routine x 2)     Status: None (Preliminary result)   Collection Time: 11/15/14  9:05 PM  Result Value Ref Range Status   Specimen Description LEFT ANTECUBITAL  Final   Special Requests   Final    BOTTLES DRAWN AEROBIC AND ANAEROBIC 6CC DRAWN BY RN   Culture PENDING  Incomplete   Report Status PENDING  Incomplete     Medical History: Past Medical History  Diagnosis Date  . Hypertension   . Hypothyroidism   . Hyperlipidemia   . Insomnia   . GERD (gastroesophageal reflux disease)     chronic gastritis  . Adrenal adenoma   . Chronic diarrhea   . Fatty liver   . Arthritis     knee  . QT prolongation     syncope  . Coronary artery disease   . Impaired fasting glucose   . Renal insufficiency     low protien diet  . Tobacco use     1/2 ppd, approx 25-50 pack years (as of 08/2012)  . Family history of heart disease   . IBS (irritable bowel syndrome)   . Peripheral arterial disease     sstatus post  infrarenal abdominal aortic tube graft placed by Dr. Victorino Dike February 2008  . Cancer     Lung  . Squamous cell carcinoma of left lung 07/29/2013   Anti-infectives    Start     Dose/Rate Route Frequency Ordered Stop   11/17/14 0600  vancomycin (VANCOCIN) IVPB 1000 mg/200 mL premix     1,000 mg 200 mL/hr over 60 Minutes Intravenous Every 24 hours 11/16/14 0748     11/16/14 1400  piperacillin-tazobactam (ZOSYN) IVPB 3.375 g     3.375 g 12.5 mL/hr over 240 Minutes Intravenous Every 8 hours 11/16/14 0747     11/16/14 1000  vancomycin (VANCOCIN) 500 mg in sodium chloride 0.9 % 100 mL  IVPB     500 mg 100 mL/hr over 60 Minutes Intravenous  Once 11/16/14 0745     11/16/14 0600  piperacillin-tazobactam (ZOSYN) IVPB 3.375 g     3.375 g 12.5 mL/hr over 240 Minutes Intravenous  Once 11/16/14 0018     11/15/14 2145  vancomycin (VANCOCIN) IVPB 1000 mg/200 mL premix     1,000 mg 200 mL/hr over 60 Minutes Intravenous  Once 11/15/14 2144 11/16/14 0105   11/15/14 2145  piperacillin-tazobactam (ZOSYN) IVPB 3.375 g     3.375 g 100 mL/hr over 30 Minutes Intravenous  Once 11/15/14 2144 11/15/14 2224     Assessment: HPI: [name] is a 80 y.o. female with a history of SCCa of Left Lung diagnosed 07/2013 S/P Chemo RX and Radiation Rx, COPD, HTN, Hypothyroid who presents to the ED with complaints of fever  chills cough and increased SOB x 3 days. She was found to have hypoxia with an O2 saturation of 86%. She was placed on 4 liters of NCO2 and had improvement. A Chest X-ray revealed worsening Left Basilar opacities. She was placed on IV Vancomycin and Zosyn and referred for medical admission.  Goal of Therapy:  Vancomycin trough level 15-20 mcg/ml  Plan:  Zosyn 3.375gm IV q8h, each dose over 4 hrs Vancomycin '1500mg'$  today (extra dose of '500mg'$  ordered to make total) then Vancomycin '1000mg'$  IV q24hrs Check trough at steady state Monitor renal fxn, SCr, progress, cultures and sensitivities Deescalate ABX when clinically appropriate  Hart Robinsons A 11/16/2014,7:48 AM

## 2014-11-16 NOTE — Progress Notes (Signed)
ANTIBIOTIC CONSULT NOTE-Preliminary  Pharmacy Consult for Vancomycin and Zosyn Indication: Pneumonia  Allergies  Allergen Reactions  . Aleve [Naproxen Sodium] Other (See Comments)    GI bleed  . Dilaudid [Hydromorphone Hcl]     Can not tolerate  . Dyazide [Hydrochlorothiazide W-Triamterene] Other (See Comments)    cramps  . Zithromax [Azithromycin] Itching    Patient Measurements: Height: '5\' 7"'$  (170.2 cm) Weight: 178 lb 8 oz (80.967 kg) IBW/kg (Calculated) : 61.6  Vital Signs: Temp: 99.8 F (37.7 C) (05/22 2322) Temp Source: Oral (05/22 2322) BP: 145/62 mmHg (05/22 2322) Pulse Rate: 96 (05/22 2322)  Labs:  Recent Labs  11/15/14 2105 11/15/14 2132  WBC 6.1  --   HGB 10.9* 11.9*  PLT 201  --   CREATININE 2.13* 2.20*    Estimated Creatinine Clearance: 23.1 mL/min (by C-G formula based on Cr of 2.2).  No results for input(s): VANCOTROUGH, VANCOPEAK, VANCORANDOM, GENTTROUGH, GENTPEAK, GENTRANDOM, TOBRATROUGH, TOBRAPEAK, TOBRARND, AMIKACINPEAK, AMIKACINTROU, AMIKACIN in the last 72 hours.   Microbiology: Recent Results (from the past 720 hour(s))  Culture, blood (routine x 2)     Status: None (Preliminary result)   Collection Time: 11/15/14  8:56 PM  Result Value Ref Range Status   Specimen Description RIGHT ANTECUBITAL  Final   Special Requests BOTTLES DRAWN AEROBIC AND ANAEROBIC 6CC  Final   Culture PENDING  Incomplete   Report Status PENDING  Incomplete  Culture, blood (routine x 2)     Status: None (Preliminary result)   Collection Time: 11/15/14  9:05 PM  Result Value Ref Range Status   Specimen Description LEFT ANTECUBITAL  Final   Special Requests   Final    BOTTLES DRAWN AEROBIC AND ANAEROBIC 6CC DRAWN BY RN   Culture PENDING  Incomplete   Report Status PENDING  Incomplete    Medical History: Past Medical History  Diagnosis Date  . Hypertension   . Hypothyroidism   . Hyperlipidemia   . Insomnia   . GERD (gastroesophageal reflux disease)    chronic gastritis  . Adrenal adenoma   . Chronic diarrhea   . Fatty liver   . Arthritis     knee  . QT prolongation     syncope  . Coronary artery disease   . Impaired fasting glucose   . Renal insufficiency     low protien diet  . Tobacco use     1/2 ppd, approx 25-50 pack years (as of 08/2012)  . Family history of heart disease   . IBS (irritable bowel syndrome)   . Peripheral arterial disease     sstatus post  infrarenal abdominal aortic tube graft placed by Dr. Victorino Dike February 2008  . Cancer     Lung  . Squamous cell carcinoma of left lung 07/29/2013    Medications:  Vancomycin 1 Gm IV x 1 dose given on 11/16/14 at 0005 Zosyn 3.375 Gm IV x one dose given in the ED on 11/15/14 at 2145  Assessment: 80 yo female seen in the ED for fever, chills, and increased SOB x 3 days. Pt has history sig for SCCa of left lung. Chest x-ray shows L basilar opacities. Empiric antibiotics to be started for pneumonia.  Goal of Therapy:  Vancomycin troughs 15-20 mcg/ml Eradicated infection  Plan:  Preliminary review of pertinent patient information completed.  Protocol will be initiated with a one-time dose of Zosyn 3.375 Gm IV @ 8 hours after the initial dose given in the ED.  Forestine Na clinical  pharmacist will complete review during morning rounds to assess patient and finalize treatment regimen.  Norberto Sorenson, [facility] 11/16/2014,12:18 AM

## 2014-11-17 LAB — BASIC METABOLIC PANEL
Anion gap: 8 (ref 5–15)
BUN: 13 mg/dL (ref 6–20)
CHLORIDE: 107 mmol/L (ref 101–111)
CO2: 25 mmol/L (ref 22–32)
CREATININE: 1.81 mg/dL — AB (ref 0.44–1.00)
Calcium: 8.6 mg/dL — ABNORMAL LOW (ref 8.9–10.3)
GFR calc Af Amer: 30 mL/min — ABNORMAL LOW (ref >60)
GFR, EST NON AFRICAN AMERICAN: 26 mL/min — AB (ref >60)
GLUCOSE: 172 mg/dL — AB (ref 65–99)
POTASSIUM: 3.6 mmol/L (ref 3.5–5.1)
Sodium: 140 mmol/L (ref 135–145)

## 2014-11-17 LAB — CBC
HCT: 30.6 % — ABNORMAL LOW (ref 36.0–46.0)
Hemoglobin: 9.7 g/dL — ABNORMAL LOW (ref 12.0–15.0)
MCH: 32.8 pg (ref 26.0–34.0)
MCHC: 31.7 g/dL (ref 30.0–36.0)
MCV: 103.4 fL — ABNORMAL HIGH (ref 78.0–100.0)
Platelets: 219 10*3/uL (ref 150–400)
RBC: 2.96 MIL/uL — ABNORMAL LOW (ref 3.87–5.11)
RDW: 15 % (ref 11.5–15.5)
WBC: 6.6 10*3/uL (ref 4.0–10.5)

## 2014-11-17 LAB — LEGIONELLA ANTIGEN, URINE

## 2014-11-17 MED ORDER — IPRATROPIUM-ALBUTEROL 0.5-2.5 (3) MG/3ML IN SOLN
3.0000 mL | RESPIRATORY_TRACT | Status: DC
  Administered 2014-11-18 – 2014-11-20 (×9): 3 mL via RESPIRATORY_TRACT
  Filled 2014-11-17 (×9): qty 3

## 2014-11-17 NOTE — Progress Notes (Signed)
[facility] PROGRESS NOTE  [name] DXA:[id number] DOB: 1935-02-22 DOA: 11/15/2014 PCP: Mickie Hillier, MD  Assessment/Plan: Acute respiratory failure with hypoxia: related to HCAP in setting of SCCa s/p chemo, radiation. Much improved this am. Continue nebs. Wean oxygen as able.  clinically indicated.  Active Problems: HCAP (healthcare-associated pneumonia): in patient with hx SCCa. vanc and zosyn day #3.  Max temp 99.2. Hemodynamically stable. Strep pneumo urine antigen and legionella urine antigen negative.   AKI on chronic renal failure (acute kidney injury)stage II- chart review indicated baseline creatinine 1.5-1.9. Creatinine in  baseline. Continue to hold nephrotoxins and continue gently IV hydration. Monitor urine output. Recheck in am   COPD, moderate: Improved air flow  With continued  faint wheeze. Continue nebs and steroids. See above  Essential hypertension: fair control. Holding home ACE inhibitor. Monitor   Anemia, iron deficiency: followed by heme/onc. Stable   Squamous cell carcinoma of left lung: chart review indicates in remission. To be seen 02/2015 for 6 month check   Generalized anxiety disorder: stable at baseline   Coronary artery disease: no chest pain. Continue home meds  Code Status: full Family Communication: granddaughter Disposition Plan: home when able   Consultants:  none  Procedures:  none  Antibiotics:  Vancomycin 11/15/14>>  Zosyn 11/15/14>>  HPI/Subjective: Sitting up in bed reports feeling better. Denies pain/discomfort  Objective: Filed Vitals:   11/17/14 0556  BP: 128/65  Pulse: 87  Temp: 98.2 F (36.8 C)  Resp: 18    Intake/Output Summary (Last 24 hours) at 11/17/14 1142 Last data filed at 11/17/14 1000  Gross per 24 hour  Intake   1660 ml  Output    350 ml  Net   1310 ml   Filed Weights   11/15/14 2032 11/15/14 2358  Weight: 80.74 kg (178 lb) 80.967 kg (178 lb 8 oz)    Exam:   General:   Well nourished   Cardiovascular: RRR no MGR no LE edema PPP  Respiratory: normal effort with conversation, BS with slightly improved air movement and fait wheeze  Abdomen: obese soft +Bs non-tender to palpation   Musculoskeletal: no clubbing or cyanosis   Data Reviewed: Basic Metabolic Panel:  Recent Labs Lab 11/15/14 2105 11/15/14 2132 11/16/14 0628 11/17/14 0502  NA 137 138 138 140  K 3.3* 3.4* 3.4* 3.6  CL 105 103 107 107  CO2 23  --  22 25  GLUCOSE 163* 162* 150* 172*  BUN '15 15 14 13  '$ CREATININE 2.13* 2.20* 1.86* 1.81*  CALCIUM 8.9  --  8.8* 8.6*   Liver Function Tests: No results for input(s): AST, ALT, ALKPHOS, BILITOT, PROT, ALBUMIN in the last 168 hours. No results for input(s): LIPASE, AMYLASE in the last 168 hours. No results for input(s): AMMONIA in the last 168 hours. CBC:  Recent Labs Lab 11/15/14 2105 11/15/14 2132 11/16/14 0628 11/17/14 0502  WBC 6.1  --  5.5 6.6  NEUTROABS 4.2  --   --   --   HGB 10.9* 11.9* 10.6* 9.7*  HCT 34.0* 35.0* 33.2* 30.6*  MCV 103.7*  --  103.4* 103.4*  PLT 201  --  137* 219   Cardiac Enzymes:  Recent Labs Lab 11/15/14 2105  TROPONINI 0.09*   BNP (last 3 results)  Recent Labs  11/15/14 2043  BNP 280.0*    ProBNP (last 3 results) No results for input(s): PROBNP in the last 8760 hours.  CBG: No results for input(s): GLUCAP in the last 168 hours.  Recent  Results (from the past 240 hour(s))  Culture, blood (routine x 2)     Status: None (Preliminary result)   Collection Time: 11/15/14  8:56 PM  Result Value Ref Range Status   Specimen Description RIGHT ANTECUBITAL  Final   Special Requests BOTTLES DRAWN AEROBIC AND ANAEROBIC 6CC  Final   Culture NO GROWTH 2 DAYS  Final   Report Status PENDING  Incomplete  Culture, blood (routine x 2)     Status: None (Preliminary result)   Collection Time: 11/15/14  9:05 PM  Result Value Ref Range Status   Specimen Description LEFT ANTECUBITAL  Final   Special  Requests   Final    BOTTLES DRAWN AEROBIC AND ANAEROBIC 6CC DRAWN BY RN   Culture NO GROWTH 2 DAYS  Final   Report Status PENDING  Incomplete     Studies: Dg Chest Port 1 View  11/15/2014   CLINICAL DATA:  Dizziness, shortness of breath. History of lung cancer.  EXAM: PORTABLE CHEST - 1 VIEW  COMPARISON:  02/24/2014; chest CT - 08/27/2014  FINDINGS: Lung volumes are reduced. Grossly unchanged enlarged cardiac silhouette and mediastinal contours with atherosclerotic plaque within the thoracic aorta. Worsening left basilar heterogeneous/consolidative opacities. Pulmonary is congestion without frank evidence of edema. Trace left-sided effusion is not excluded. No pneumothorax. Stable position of support apparatus. No acute osseus abnormalities.  IMPRESSION: Worsening left basilar opacities, atelectasis versus infiltrate, potentially accentuated due to decreased lung volumes. Further evaluation with a PA and lateral chest radiograph may be obtained as clinically indicated.   Electronically Signed   By: Sandi Mariscal M.D.   On: 11/15/2014 21:27    Scheduled Meds: . allopurinol  300 mg Oral Daily  . amLODipine  5 mg Oral Daily  . aspirin EC  81 mg Oral Daily  . enoxaparin (LOVENOX) injection  30 mg Subcutaneous Q24H  . fenofibrate  160 mg Oral Daily  . ferrous sulfate  325 mg Oral BID PC  . folic acid  1 mg Oral Daily  . hydrALAZINE  25 mg Oral TID  . ipratropium-albuterol  3 mL Nebulization Q4H  . levothyroxine  137 mcg Oral QAC breakfast  . pantoprazole  40 mg Oral Daily  . PARoxetine  30 mg Oral Daily  . piperacillin-tazobactam (ZOSYN)  IV  3.375 g Intravenous Q8H  . potassium chloride  10 mEq Oral Daily  . pravastatin  20 mg Oral Daily  . predniSONE  5 mg Oral BID WC  . vancomycin  1,000 mg Intravenous Q24H   Continuous Infusions: . sodium chloride 50 mL/hr at 11/17/14 9675    Principal Problem:   Acute respiratory failure with hypoxia Active Problems:   Chronic renal insufficiency    Generalized anxiety disorder   Coronary artery disease   Essential hypertension   Anemia, iron deficiency   Squamous cell carcinoma of left lung   AKI (acute kidney injury)   COPD, moderate   HCAP (healthcare-associated pneumonia)   Hypoxia    Time spent: 30 minutes    [place] [facility] Pager [phone]. If 7PM-7AM, please contact night-coverage at [email or web address], password [facility] 11/17/2014, 11:42 AM  LOS: 2 days

## 2014-11-18 ENCOUNTER — Inpatient Hospital Stay (HOSPITAL_COMMUNITY): Payer: Medicare Other

## 2014-11-18 DIAGNOSIS — N179 Acute kidney failure, unspecified: Secondary | ICD-10-CM

## 2014-11-18 DIAGNOSIS — J189 Pneumonia, unspecified organism: Principal | ICD-10-CM

## 2014-11-18 DIAGNOSIS — J9601 Acute respiratory failure with hypoxia: Secondary | ICD-10-CM

## 2014-11-18 LAB — CBC
HCT: 33.2 % — ABNORMAL LOW (ref 36.0–46.0)
Hemoglobin: 10.6 g/dL — ABNORMAL LOW (ref 12.0–15.0)
MCH: 32.9 pg (ref 26.0–34.0)
MCHC: 31.9 g/dL (ref 30.0–36.0)
MCV: 103.1 fL — ABNORMAL HIGH (ref 78.0–100.0)
Platelets: 252 10*3/uL (ref 150–400)
RBC: 3.22 MIL/uL — AB (ref 3.87–5.11)
RDW: 14.9 % (ref 11.5–15.5)
WBC: 6.5 10*3/uL (ref 4.0–10.5)

## 2014-11-18 LAB — BASIC METABOLIC PANEL
Anion gap: 6 (ref 5–15)
BUN: 10 mg/dL (ref 6–20)
CO2: 24 mmol/L (ref 22–32)
CREATININE: 1.76 mg/dL — AB (ref 0.44–1.00)
Calcium: 9 mg/dL (ref 8.9–10.3)
Chloride: 109 mmol/L (ref 101–111)
GFR calc Af Amer: 31 mL/min — ABNORMAL LOW (ref >60)
GFR, EST NON AFRICAN AMERICAN: 27 mL/min — AB (ref >60)
Glucose, Bld: 161 mg/dL — ABNORMAL HIGH (ref 65–99)
Potassium: 3.8 mmol/L (ref 3.5–5.1)
SODIUM: 139 mmol/L (ref 135–145)

## 2014-11-18 NOTE — Progress Notes (Addendum)
PT Cancellation Note  Patient Details Name: [name] MRN: [id number] DOB: [birth year]   Cancelled Treatment:    Reason Eval/Treat Not Completed: Medical issues which prohibited therapy.  I attempted to work with pt this AM.  She appeared to be nervous with a mild generalized tremor.  She stated that she needed to go to the bathroom so she was assisted to the [facility].  She reported that she was having trouble breathing.  O2 sat on 3 L O2 was 94%.  She requested to have O2 delivered via O2 mask.  I requested RN assistance and RT also was available .  RT prepared to give pt a breathing tx and then transition her to an O2 mask for comfort.  At that point, O2 sat= 90%.  I decided to hold PT for today.   Demetrios Isaacs L  PT 11/18/2014, 11:58 AM

## 2014-11-18 NOTE — Progress Notes (Signed)
[facility] PROGRESS NOTE  [name] ION:[id number] DOB: [birth year] DOA: 11/15/2014 PCP: Mickie Hillier, MD  Assessment/Plan: Acute respiratory failure with hypoxia: related to HCAP in setting of SCCa s/p chemo, radiation. Not much improvement from yesterday. sats drop to 79 on room air. Continues to require 3L to maintain sats >90% without tachypnea. BS more diminished. Will repeat chest xray. HR trending up somewhat. Concern for PE. Echo dated 01/2014 with severe LVH, EF 24% grade 1 diastolic dysfunction. Does not appear overloaded.  Continue nebs.  monitor Active Problems:  HCAP (healthcare-associated pneumonia): in patient with hx SCCa. vanc and zosyn day #4. Max temp 99.1. Hemodynamically stable. slighty tachycardic.  Strep pneumo urine antigen and legionella urine antigen negative. Await results chest xray. Consider narrowing antibiotics.  AKI on chronic renal failure (acute kidney injury)stage II- chart review indicated baseline creatinine 1.5-1.9. Creatinine remains at baseline. Continue to hold nephrotoxins. Urine output good. Monitor   COPD, moderate: decreased air flow. Home meds include spiriva and low dose prednisone. Continue nebs and steroids. Family/patient unaware of "official" diagnosis but endorse DOE over several months. May benefit OP PFT.  Essential hypertension: fair control. Holding home ACE inhibitor. Monitor   Anemia, iron deficiency: followed by heme/onc. Stable   Squamous cell carcinoma of left lung: chart review indicates in remission. To be seen 02/2015 for 6 month check   Generalized anxiety disorder: stable at baseline   Coronary artery disease: no chest pain. Continue home meds    Code Status: full Family Communication: granddaughter at bedside Disposition Plan: home when ready hopefully 24-48 hours   Consultants:  none  Procedures:  none  Antibiotics:  Vancomycin 11/15/14>>  Zosyn 11/15/14>>   HPI/Subjective: Sitting up  in bed. Reports that she does not feel as though her breathing is any better at all. Denies pain/discomfort. Less coughing  Objective: Filed Vitals:   11/18/14 1025  BP:   Pulse: 100  Temp:   Resp:     Intake/Output Summary (Last 24 hours) at 11/18/14 1034 Last data filed at 11/18/14 0900  Gross per 24 hour  Intake 1250.66 ml  Output   1100 ml  Net 150.66 ml   Filed Weights   11/15/14 2032 11/15/14 2358  Weight: 80.74 kg (178 lb) 80.967 kg (178 lb 8 oz)    Exam:   General:  Well nourished appears comfortable  Cardiovascular: tachycardic but regular no LE edema no MGR  Respiratory: mild increased work of breathing with conversation, shallow BS quite diminished throughout. No wheeze or crackles  Abdomen: soft non-distended +BS no guarding or rebouding  Musculoskeletal: joints without swelling/erythema   Data Reviewed: Basic Metabolic Panel:  Recent Labs Lab 11/15/14 2105 11/15/14 2132 11/16/14 0628 11/17/14 0502 11/18/14 0609  NA 137 138 138 140 139  K 3.3* 3.4* 3.4* 3.6 3.8  CL 105 103 107 107 109  CO2 23  --  '22 25 24  '$ GLUCOSE 163* 162* 150* 172* 161*  BUN '15 15 14 13 10  '$ CREATININE 2.13* 2.20* 1.86* 1.81* 1.76*  CALCIUM 8.9  --  8.8* 8.6* 9.0   Liver Function Tests: No results for input(s): AST, ALT, ALKPHOS, BILITOT, PROT, ALBUMIN in the last 168 hours. No results for input(s): LIPASE, AMYLASE in the last 168 hours. No results for input(s): AMMONIA in the last 168 hours. CBC:  Recent Labs Lab 11/15/14 2105 11/15/14 2132 11/16/14 0628 11/17/14 0502 11/18/14 0609  WBC 6.1  --  5.5 6.6 6.5  NEUTROABS 4.2  --   --   --   --  HGB 10.9* 11.9* 10.6* 9.7* 10.6*  HCT 34.0* 35.0* 33.2* 30.6* 33.2*  MCV 103.7*  --  103.4* 103.4* 103.1*  PLT 201  --  137* 219 252   Cardiac Enzymes:  Recent Labs Lab 11/15/14 2105  TROPONINI 0.09*   BNP (last 3 results)  Recent Labs  11/15/14 2043  BNP 280.0*    ProBNP (last 3 results) No results for  input(s): PROBNP in the last 8760 hours.  CBG: No results for input(s): GLUCAP in the last 168 hours.  Recent Results (from the past 240 hour(s))  Culture, blood (routine x 2)     Status: None (Preliminary result)   Collection Time: 11/15/14  8:56 PM  Result Value Ref Range Status   Specimen Description RIGHT ANTECUBITAL  Final   Special Requests BOTTLES DRAWN AEROBIC AND ANAEROBIC 6CC  Final   Culture NO GROWTH 2 DAYS  Final   Report Status PENDING  Incomplete  Culture, blood (routine x 2)     Status: None (Preliminary result)   Collection Time: 11/15/14  9:05 PM  Result Value Ref Range Status   Specimen Description LEFT ANTECUBITAL  Final   Special Requests   Final    BOTTLES DRAWN AEROBIC AND ANAEROBIC 6CC DRAWN BY RN   Culture NO GROWTH 2 DAYS  Final   Report Status PENDING  Incomplete     Studies: No results found.  Scheduled Meds: . allopurinol  300 mg Oral Daily  . amLODipine  5 mg Oral Daily  . aspirin EC  81 mg Oral Daily  . enoxaparin (LOVENOX) injection  30 mg Subcutaneous Q24H  . fenofibrate  160 mg Oral Daily  . ferrous sulfate  325 mg Oral BID PC  . folic acid  1 mg Oral Daily  . hydrALAZINE  25 mg Oral TID  . ipratropium-albuterol  3 mL Nebulization Q4H WA  . levothyroxine  137 mcg Oral QAC breakfast  . pantoprazole  40 mg Oral Daily  . PARoxetine  30 mg Oral Daily  . piperacillin-tazobactam (ZOSYN)  IV  3.375 g Intravenous Q8H  . potassium chloride  10 mEq Oral Daily  . pravastatin  20 mg Oral Daily  . predniSONE  5 mg Oral BID WC  . vancomycin  1,000 mg Intravenous Q24H   Continuous Infusions: . sodium chloride 10 mL/hr at 11/17/14 1346    Principal Problem:   Acute respiratory failure with hypoxia Active Problems:   Chronic renal insufficiency   Generalized anxiety disorder   Coronary artery disease   Essential hypertension   Anemia, iron deficiency   Squamous cell carcinoma of left lung   AKI (acute kidney injury)   COPD, moderate   HCAP  (healthcare-associated pneumonia)   Hypoxia    Time spent: 30 minutes    [facility] Pager [phone]. If 7PM-7AM, please contact night-coverage at [email or web address], password [facility] 11/18/2014, 10:34 AM  LOS: 3 days

## 2014-11-18 NOTE — Care Management Note (Signed)
Case Management Note  Patient Details  Name: Sherrel Ploch MRN: [id number] Date of Birth: [birth year]  Subjective/Objective:                    Action/Plan:   Expected Discharge Date:                  Expected Discharge Plan:  [place]  In-House Referral:  NA  Discharge planning Services  CM Consult  Post Acute Care Choice:  Durable Medical Equipment Choice offered to:  Patient  DME Arranged:  Walker rolling DME Agency:  [place]:    [facility] Agency:     Status of Service:  In process, will continue to follow  Medicare Important Message Given:    Date Medicare IM Given:    Medicare IM give by:    Date Additional Medicare IM Given:    Additional Medicare Important Message give by:     If discussed at [place] of Stay Meetings, dates discussed:    Additional Comments: Pt and pts daughters not interested in home health services at this time. Pt would like rolling walker for home use from [facility]. Referral called to [facility] and walker will be delivered to pts room prior to discharge. Will need to assess need for home O2 prior to discharge. Will continue to follow. Christinia Gully [place], RN 11/18/2014, 10:37 AM

## 2014-11-18 NOTE — Plan of Care (Signed)
Problem: Phase I Progression Outcomes Goal: OOB as tolerated unless otherwise ordered Outcome: Completed/Met Date Met:  11/18/14 Patient up to chair and bathroom today.

## 2014-11-19 ENCOUNTER — Encounter (HOSPITAL_COMMUNITY): Payer: Self-pay | Admitting: Internal Medicine

## 2014-11-19 ENCOUNTER — Inpatient Hospital Stay (HOSPITAL_COMMUNITY): Payer: Medicare Other

## 2014-11-19 DIAGNOSIS — C3492 Malignant neoplasm of unspecified part of left bronchus or lung: Secondary | ICD-10-CM

## 2014-11-19 DIAGNOSIS — J90 Pleural effusion, not elsewhere classified: Secondary | ICD-10-CM | POA: Diagnosis present

## 2014-11-19 LAB — BASIC METABOLIC PANEL
Anion gap: 9 (ref 5–15)
BUN: 10 mg/dL (ref 6–20)
CO2: 27 mmol/L (ref 22–32)
CREATININE: 1.8 mg/dL — AB (ref 0.44–1.00)
Calcium: 8.7 mg/dL — ABNORMAL LOW (ref 8.9–10.3)
Chloride: 104 mmol/L (ref 101–111)
GFR, EST AFRICAN AMERICAN: 30 mL/min — AB (ref >60)
GFR, EST NON AFRICAN AMERICAN: 26 mL/min — AB (ref >60)
Glucose, Bld: 129 mg/dL — ABNORMAL HIGH (ref 65–99)
Potassium: 3.8 mmol/L (ref 3.5–5.1)
Sodium: 140 mmol/L (ref 135–145)

## 2014-11-19 LAB — CBC
HEMATOCRIT: 32.3 % — AB (ref 36.0–46.0)
HEMOGLOBIN: 10.4 g/dL — AB (ref 12.0–15.0)
MCH: 33.7 pg (ref 26.0–34.0)
MCHC: 32.2 g/dL (ref 30.0–36.0)
MCV: 104.5 fL — ABNORMAL HIGH (ref 78.0–100.0)
Platelets: 257 10*3/uL (ref 150–400)
RBC: 3.09 MIL/uL — ABNORMAL LOW (ref 3.87–5.11)
RDW: 15.2 % (ref 11.5–15.5)
WBC: 4.6 10*3/uL (ref 4.0–10.5)

## 2014-11-19 NOTE — Progress Notes (Addendum)
[place] for Vancomycin and Zosyn Indication: pneumonia  Allergies  Allergen Reactions  . Aleve [Naproxen Sodium] Other (See Comments)    GI bleed  . Dilaudid [Hydromorphone Hcl]     Can not tolerate  . Dyazide [Hydrochlorothiazide W-Triamterene] Other (See Comments)    cramps  . Zithromax [Azithromycin] Itching   Patient Measurements: Height: '5\' 7"'$  (170.2 cm) Weight: 178 lb 8 oz (80.967 kg) IBW/kg (Calculated) : 61.6  Vital Signs: Temp: 99.1 F (37.3 C) (05/26 1039) Temp Source: Oral (05/26 1039) BP: 141/72 mmHg (05/26 1039) Pulse Rate: 92 (05/26 1039) Intake/Output from previous day: 05/25 0701 - 05/26 0700 In: 1663.8 [P.O.:780; I.V.:233.8; IV Piggyback:650] Out: -  Intake/Output from this shift: Total I/O In: 240 [P.O.:240] Out: -   Labs:  Recent Labs  11/17/14 0502 11/18/14 0609 11/19/14 0622  WBC 6.6 6.5 4.6  HGB 9.7* 10.6* 10.4*  PLT 219 252 257  CREATININE 1.81* 1.76* 1.80*   Estimated Creatinine Clearance: 28.2 mL/min (by C-G formula based on Cr of 1.8). No results for input(s): VANCOTROUGH, VANCOPEAK, VANCORANDOM, GENTTROUGH, GENTPEAK, GENTRANDOM, TOBRATROUGH, TOBRAPEAK, TOBRARND, AMIKACINPEAK, AMIKACINTROU, AMIKACIN in the last 72 hours.   Microbiology: Recent Results (from the past 720 hour(s))  Culture, blood (routine x 2)     Status: None (Preliminary result)   Collection Time: 11/15/14  8:56 PM  Result Value Ref Range Status   Specimen Description RIGHT ANTECUBITAL  Final   Special Requests BOTTLES DRAWN AEROBIC AND ANAEROBIC 6CC  Final   Culture NO GROWTH 4 DAYS  Final   Report Status PENDING  Incomplete  Culture, blood (routine x 2)     Status: None (Preliminary result)   Collection Time: 11/15/14  9:05 PM  Result Value Ref Range Status   Specimen Description LEFT ANTECUBITAL  Final   Special Requests   Final    BOTTLES DRAWN AEROBIC AND ANAEROBIC 6CC DRAWN BY RN   Culture NO GROWTH 4 DAYS  Final   Report Status PENDING  Incomplete    Medical History: Past Medical History  Diagnosis Date  . Hypertension   . Hypothyroidism   . Hyperlipidemia   . Insomnia   . GERD (gastroesophageal reflux disease)     chronic gastritis  . Adrenal adenoma   . Chronic diarrhea   . Fatty liver   . Arthritis     knee  . QT prolongation     syncope  . Coronary artery disease   . Impaired fasting glucose   . Renal insufficiency     low protien diet  . Tobacco use     1/2 ppd, approx 25-50 pack years (as of 08/2012)  . Family history of heart disease   . IBS (irritable bowel syndrome)   . Peripheral arterial disease     sstatus post  infrarenal abdominal aortic tube graft placed by Dr. Victorino Dike February 2008  . Cancer     Lung  . Squamous cell carcinoma of left lung 07/29/2013  . HCAP (healthcare-associated pneumonia)     10/2014  . Acute respiratory failure     10/2014  . COPD (chronic obstructive pulmonary disease)     moderat  . Pleural effusion    Anti-infectives    Start     Dose/Rate Route Frequency Ordered Stop   11/17/14 0600  vancomycin (VANCOCIN) IVPB 1000 mg/200 mL premix     1,000 mg 200 mL/hr over 60 Minutes Intravenous Every 24 hours 11/16/14 0748  11/16/14 1400  piperacillin-tazobactam (ZOSYN) IVPB 3.375 g     3.375 g 12.5 mL/hr over 240 Minutes Intravenous Every 8 hours 11/16/14 0747     11/16/14 1000  vancomycin (VANCOCIN) 500 mg in sodium chloride 0.9 % 100 mL IVPB     500 mg 100 mL/hr over 60 Minutes Intravenous  Once 11/16/14 0745 11/16/14 1113   11/16/14 0600  piperacillin-tazobactam (ZOSYN) IVPB 3.375 g     3.375 g 12.5 mL/hr over 240 Minutes Intravenous  Once 11/16/14 0018 11/16/14 0957   11/15/14 2145  vancomycin (VANCOCIN) IVPB 1000 mg/200 mL premix     1,000 mg 200 mL/hr over 60 Minutes Intravenous  Once 11/15/14 2144 11/16/14 0105   11/15/14 2145  piperacillin-tazobactam (ZOSYN) IVPB 3.375 g     3.375 g 100 mL/hr over 30 Minutes Intravenous  Once  11/15/14 2144 11/15/14 2224     Assessment: 80 yo F who presented to the ED with complaints of fever, chills, cough and increased SOB x 3 days. She was found to have hypoxia with an O2 saturation of 86%.Chest X-ray revealed worsening Left Basilar opacities. She was started on IV Vancomycin and Zosyn.  She is currently on on day#4 antibiotics.  She is clinically unimproved- worsening consolidation per chest CT and hypoxia.   She is afebrile with normal WBC. Renal has been stable.    Goal of Therapy:  Vancomycin trough level 15-20 mcg/ml  Plan:  Continue Zosyn 3.375gm IV q8h Continue Vancomycin '1000mg'$  IV q24hrs Check trough at steady state -ordered for 5/27 am Monitor patient progress, renal function and cx data Duration of therapy per MD- de-escalate ABX when clinically appropriate  [name], Lavonia Drafts 11/19/2014,2:27 PM

## 2014-11-19 NOTE — Progress Notes (Signed)
PT Cancellation Note  Patient Details Name: [name] MRN: [id number] DOB: 06/19/1935   Cancelled Treatment:    Reason Eval/Treat Not Completed: Medical issues which prohibited therapy.  Pt is up in a chair on 5 L O2 and O2 sat 91-92%.  I am concerned that she would not tolerate PT at this point due to her respiratory status.  I discussed this with Dyanne Carrel, NP who advised that we hold PT today.  Will reassess tomorrow.   Demetrios Isaacs L  PT 11/19/2014, 10:26 AM

## 2014-11-19 NOTE — Progress Notes (Signed)
Patient's oxygen requirement has increased since yesterday. Patient requiring 4-5L to maintain O2 saturations greater than 90%. Patient switches between mask and nasal cannula. Increased productive sputum noted today-- moderate amount of thick, clear/white sputum. O2 saturations tend to decrease after coughing spells. Lung sounds still diminished, but more audible than yesterday. Patient sitting up in chair at this time. Will continue to monitor.

## 2014-11-19 NOTE — Progress Notes (Signed)
[facility] PROGRESS NOTE  Tayten Heber SNK:[id number] DOB: 1934/11/12 DOA: 11/15/2014 PCP: Mickie Hillier, MD  Assessment/Plan: Acute respiratory failure with hypoxia: related to HCAP in setting of SCCa s/p chemo, radiation. Worsening hypoxia today. CT reveals new left pleural effusion and progressive air space consolidation on left. Will obtain US of pleural effusion and thoracentesis if indicated. Will obtain cytology and culture of fluid.  Active Problems:  HCAP (healthcare-associated pneumonia): in patient with hx SCCa. vanc and zosyn day #5. Max temp 99.1. Hemodynamically stable. Strep pneumo urine antigen and legionella urine antigen negative. Chest CT reveals progressive airspace consolidation involving the entire left lower lobe. See #1  AKI on chronic renal failure (acute kidney injury)stage II- chart review indicated baseline creatinine 1.5-1.9. Current creatinine remains at baseline. Continue to hold nephrotoxins. Urine output good. Monitor   COPD, moderate: see above. Home meds include spiriva and low dose prednisone. Continue nebs and steroids. Family/patient unaware of "official" diagnosis but endorse DOE over several months.   Essential hypertension: fair control. Holding home ACE inhibitor.    Anemia, iron deficiency: followed by heme/onc. Stable   Squamous cell carcinoma of left lung: chart review indicates in remission. To be seen 02/2015 for 6 month check. See #1   Generalized anxiety disorder: stable at baseline   Coronary artery disease: no chest pain. Continue home meds   Code Status: full Family Communication: grandaughter at bedside Disposition Plan: home when ready   Consultants:  none  Procedures:  none  Antibiotics:  Vancomycin 11/15/14>>  Zosyn 11/15/14>>  HPI/Subjective: Sitting in chair. Reports "not feeling well". Denies pain/discomfort. Feels more short of breath  Objective: Filed Vitals:   11/19/14 1039  BP: 141/72   Pulse: 92  Temp: 99.1 F (37.3 C)  Resp: 18    Intake/Output Summary (Last 24 hours) at 11/19/14 1343 Last data filed at 11/19/14 0954  Gross per 24 hour  Intake 1063.83 ml  Output      0 ml  Net 1063.83 ml   Filed Weights   11/15/14 2032 11/15/14 2358  Weight: 80.74 kg (178 lb) 80.967 kg (178 lb 8 oz)    Exam:   General:  Well nourished appears slightly anxious  Cardiovascular: RRR no MGR no LE edema  Respiratory: mild increased work of breathing very diminished breath sounds on left, fine crackles, improved air movement on right   Abdomen: non-distended non-tender +BS   Musculoskeletal: no clubbing or cyanosis   Data Reviewed: Basic Metabolic Panel:  Recent Labs Lab 11/15/14 2105 11/15/14 2132 11/16/14 0628 11/17/14 0502 11/18/14 0609 11/19/14 0622  NA 137 138 138 140 139 140  K 3.3* 3.4* 3.4* 3.6 3.8 3.8  CL 105 103 107 107 109 104  CO2 23  --  '22 25 24 27  '$ GLUCOSE 163* 162* 150* 172* 161* 129*  BUN '15 15 14 13 10 10  '$ CREATININE 2.13* 2.20* 1.86* 1.81* 1.76* 1.80*  CALCIUM 8.9  --  8.8* 8.6* 9.0 8.7*   Liver Function Tests: No results for input(s): AST, ALT, ALKPHOS, BILITOT, PROT, ALBUMIN in the last 168 hours. No results for input(s): LIPASE, AMYLASE in the last 168 hours. No results for input(s): AMMONIA in the last 168 hours. CBC:  Recent Labs Lab 11/15/14 2105 11/15/14 2132 11/16/14 0628 11/17/14 0502 11/18/14 0609 11/19/14 0622  WBC 6.1  --  5.5 6.6 6.5 4.6  NEUTROABS 4.2  --   --   --   --   --   HGB 10.9* 11.9* 10.6*  9.7* 10.6* 10.4*  HCT 34.0* 35.0* 33.2* 30.6* 33.2* 32.3*  MCV 103.7*  --  103.4* 103.4* 103.1* 104.5*  PLT 201  --  137* 219 252 257   Cardiac Enzymes:  Recent Labs Lab 11/15/14 2105  TROPONINI 0.09*   BNP (last 3 results)  Recent Labs  11/15/14 2043  BNP 280.0*    ProBNP (last 3 results) No results for input(s): PROBNP in the last 8760 hours.  CBG: No results for input(s): GLUCAP in the last 168  hours.  Recent Results (from the past 240 hour(s))  Culture, blood (routine x 2)     Status: None (Preliminary result)   Collection Time: 11/15/14  8:56 PM  Result Value Ref Range Status   Specimen Description RIGHT ANTECUBITAL  Final   Special Requests BOTTLES DRAWN AEROBIC AND ANAEROBIC 6CC  Final   Culture NO GROWTH 4 DAYS  Final   Report Status PENDING  Incomplete  Culture, blood (routine x 2)     Status: None (Preliminary result)   Collection Time: 11/15/14  9:05 PM  Result Value Ref Range Status   Specimen Description LEFT ANTECUBITAL  Final   Special Requests   Final    BOTTLES DRAWN AEROBIC AND ANAEROBIC 6CC DRAWN BY RN   Culture NO GROWTH 4 DAYS  Final   Report Status PENDING  Incomplete     Studies: Dg Chest 2 View  11/18/2014   CLINICAL DATA:  Hypoxia with nonproductive cough  EXAM: CHEST  2 VIEW  COMPARISON:  11/15/2014  FINDINGS: Cardiac shadow is stable. A right chest wall port is again seen. Post radiation changes noted along the mediastinum particularly on the left but to a lesser degree on the right. Aortic calcifications are seen. Improved aeration in the left base is noted. No acute bony abnormality is seen  IMPRESSION: Chronic changes as described.  No acute abnormality is noted.   Electronically Signed   By: Inez Catalina M.D.   On: 11/18/2014 13:26   Ct Chest Wo Contrast  11/19/2014   CLINICAL DATA:  Hypoxia for 2 months.  Right side breast cancer.  EXAM: CT CHEST WITHOUT CONTRAST  TECHNIQUE: Multidetector CT imaging of the chest was performed following the standard protocol without IV contrast.  COMPARISON:  08/27/2014  FINDINGS: Mediastinum: There is moderate cardiac enlargement. Calcified atherosclerotic disease involves the thoracic aorta as well as the LAD, left circumflex coronary arteries. The trachea appears patent and is midline. Normal appearance of the esophagus. No mediastinal or hilar adenopathy identified.  Lungs/Pleura: There is a left pleural effusion  which appears new from previous exam. Next  Moderate to advanced changes of centrilobular and paraseptal emphysema identified.  Changes of external beam radiation are identified within both lungs in a paramediastinal distribution. There is progressive consolidation involving the almost all of the left lower lobe, image 44/series 2 and image number 77 of series 5 this obscures the previously described nonspecific nodular opacity in the posterior left lung. The previously identified subpleural nodule in the posterior right lung base is not seen on today's study.  Upper Abdomen: Normal appearance of the right adrenal gland. Left adrenal gland adenoma is again noted and appears unchanged from previous study, image 60/series 2. Within the left lobe of liver there is a 1.9 cm low-attenuation structure, image 55/series 2. There is a 1.1 cm stone within the gallbladder. This is unchanged from previous exam. Aortic atherosclerosis noted. The infrarenal abdominal aorta measures up to 3 cm.  Musculoskeletal: Review of  the visualized osseous structures is negative for aggressive lytic or sclerotic bone lesion.  IMPRESSION: 1. There is progressive airspace consolidation involving the entire left lower lobe. This is in an area of biopsy proven squamous cell carcinoma. Although these findings may reflect consolidation either due to progressive changes of external beam radiation or pneumonia recurrent tumor cannot be excluded. Consider further evaluation with bronchoscopy and tissue sampling. 2. New left pleural effusion. 3. Stable low-attenuation structure in left lobe of liver. 4. Aortic atherosclerosis including coronary artery calcification 5. Increased AP diameter of the infrarenal abdominal aorta which measures 3 cm. Recommend followup by ultrasound in 3 years. This recommendation follows ACR consensus guidelines: White Paper of the ACR Incidental Findings Committee II on Vascular Findings. J Am Coll Radiol 2013; 46:286-381  6. Emphysema.   Electronically Signed   By: Kerby Moors M.D.   On: 11/19/2014 12:56    Scheduled Meds: . allopurinol  300 mg Oral Daily  . amLODipine  5 mg Oral Daily  . aspirin EC  81 mg Oral Daily  . enoxaparin (LOVENOX) injection  30 mg Subcutaneous Q24H  . fenofibrate  160 mg Oral Daily  . ferrous sulfate  325 mg Oral BID PC  . folic acid  1 mg Oral Daily  . hydrALAZINE  25 mg Oral TID  . ipratropium-albuterol  3 mL Nebulization Q4H WA  . levothyroxine  137 mcg Oral QAC breakfast  . pantoprazole  40 mg Oral Daily  . PARoxetine  30 mg Oral Daily  . piperacillin-tazobactam (ZOSYN)  IV  3.375 g Intravenous Q8H  . potassium chloride  10 mEq Oral Daily  . pravastatin  20 mg Oral Daily  . predniSONE  5 mg Oral BID WC  . vancomycin  1,000 mg Intravenous Q24H   Continuous Infusions: . sodium chloride 10 mL/hr at 11/19/14 7711    Principal Problem:   Acute respiratory failure with hypoxia Active Problems:   Chronic renal insufficiency   Generalized anxiety disorder   Coronary artery disease   Essential hypertension   Anemia, iron deficiency   Squamous cell carcinoma of left lung   AKI (acute kidney injury)   COPD, moderate   HCAP (healthcare-associated pneumonia)   Hypoxia   Pleural effusion    Time spent:     [facility] Pager [phone]. If 7PM-7AM, please contact night-coverage at [email or web address], password [facility] 11/19/2014, 1:43 PM  LOS: 4 days

## 2014-11-19 NOTE — Progress Notes (Signed)
UR chart review completed.  

## 2014-11-20 ENCOUNTER — Other Ambulatory Visit (HOSPITAL_COMMUNITY): Payer: Self-pay | Admitting: Oncology

## 2014-11-20 DIAGNOSIS — R739 Hyperglycemia, unspecified: Secondary | ICD-10-CM | POA: Diagnosis present

## 2014-11-20 LAB — CBC
HEMATOCRIT: 31.9 % — AB (ref 36.0–46.0)
Hemoglobin: 10.1 g/dL — ABNORMAL LOW (ref 12.0–15.0)
MCH: 33.1 pg (ref 26.0–34.0)
MCHC: 31.7 g/dL (ref 30.0–36.0)
MCV: 104.6 fL — AB (ref 78.0–100.0)
PLATELETS: 279 10*3/uL (ref 150–400)
RBC: 3.05 MIL/uL — ABNORMAL LOW (ref 3.87–5.11)
RDW: 15 % (ref 11.5–15.5)
WBC: 5.9 10*3/uL (ref 4.0–10.5)

## 2014-11-20 LAB — BASIC METABOLIC PANEL
Anion gap: 9 (ref 5–15)
BUN: 11 mg/dL (ref 6–20)
CO2: 28 mmol/L (ref 22–32)
Calcium: 8.9 mg/dL (ref 8.9–10.3)
Chloride: 104 mmol/L (ref 101–111)
Creatinine, Ser: 1.82 mg/dL — ABNORMAL HIGH (ref 0.44–1.00)
GFR, EST AFRICAN AMERICAN: 30 mL/min — AB (ref >60)
GFR, EST NON AFRICAN AMERICAN: 25 mL/min — AB (ref >60)
GLUCOSE: 118 mg/dL — AB (ref 65–99)
Potassium: 4 mmol/L (ref 3.5–5.1)
Sodium: 141 mmol/L (ref 135–145)

## 2014-11-20 LAB — VANCOMYCIN, TROUGH: Vancomycin Tr: 14 ug/mL (ref 10.0–20.0)

## 2014-11-20 MED ORDER — ALBUTEROL SULFATE (2.5 MG/3ML) 0.083% IN NEBU: 2.5000 mg | INHALATION_SOLUTION | RESPIRATORY_TRACT | Status: DC | PRN

## 2014-11-20 MED ORDER — IPRATROPIUM-ALBUTEROL 0.5-2.5 (3) MG/3ML IN SOLN
3.0000 mL | Freq: Three times a day (TID) | RESPIRATORY_TRACT | Status: DC
  Administered 2014-11-20 – 2014-11-21 (×5): 3 mL via RESPIRATORY_TRACT
  Filled 2014-11-20 (×5): qty 3

## 2014-11-20 NOTE — Evaluation (Signed)
Physical Therapy Evaluation Patient Details Name: [name] MRN: [id number] DOB: 12/23/1934 Today's Date: 11/20/2014   History of Present Illness  80 year old woman with history of lung cancer treated with chemotherapy and radiation, tobacco dependence in remission, no history of hypoxia who presented with several day 2 week history of increasing shortness of breath, productive cough, multiple episodes of nausea and vomiting. She was found to have acute hypoxic respiratory failure with increased work of breathing in the emergency department and admitted for HCAP, hypoxia, acute kidney injury.  Clinical Impression   Pt was seen for evaluation.  She was very alert and oriented, now on 3 L O2 with O2 sat=92%.  Evaluation reveals her to be generally deconditioned with fair functional ability.  She was able to ambulate 20' with a walker and good stability.  She was on 3 L O2 and after gait O2 sat=84%.  She was instructed to do deep breathing ex and O2 sat slowly rebounded to 92%.  Her O2 sat on room air at rest = 83%.  Pt would certainly be appropriate for SNF at d/c but both she and her family would like for her to d/c to home.  As long as she has full time assist there should be no obstacles to this.    Follow Up Recommendations Home health PT    Equipment Recommendations  Rolling walker with 5" wheels    Recommendations for Other Services   none    Precautions / Restrictions Precautions Precautions: Fall Restrictions Weight Bearing Restrictions: No      Mobility  Bed Mobility Overal bed mobility: Modified Independent             General bed mobility comments: movements are very slow and labored with HOB up at 30 degrees.Marland KitchenMarland KitchenI have recommended to pt that CG will probably have to help her to sit up at EOB when home  Transfers Overall transfer level: Needs assistance Equipment used: Rolling walker (2 wheeled) Transfers: Sit to/from Stand Sit to Stand: Min guard             Ambulation/Gait Ambulation/Gait assistance: Supervision Ambulation Distance (Feet): 20 Feet   Gait Pattern/deviations: WFL(Within Functional Limits) Gait velocity: appropriate for situation   General Gait Details: O2 sat down to 84% after gait on 3 L O2                      Balance Overall balance assessment: No apparent balance deficits (not formally assessed)                                           Pertinent Vitals/Pain Pain Assessment: No/denies pain    Home Living Family/patient expects to be discharged to:: Private residence Living Arrangements: Children;Alone (children take turns staying with Mom) Available Help at Discharge: Family Type of Home: House Home Access: Level entry     Home Layout: One level Home Equipment: None      Prior Function Level of Independence: Independent                       Extremity/Trunk Assessment               Lower Extremity Assessment: Generalized weakness      Cervical / Trunk Assessment: Normal  Communication   Communication: No difficulties  Cognition Arousal/Alertness: Awake/alert Behavior During Therapy: WFL for  tasks assessed/performed Overall Cognitive Status: Within Functional Limits for tasks assessed                            Exercises General Exercises - Lower Extremity Ankle Circles/Pumps: AAROM;5 reps;Supine;Both Short Arc Quad: AROM;Both;5 reps;Supine Heel Slides: AROM;Both;5 reps;Supine Hip ABduction/ADduction: AROM;Both;5 reps;Supine      Assessment/Plan    PT Assessment Patient needs continued PT services  PT Diagnosis Difficulty walking;Generalized weakness   PT Problem List Decreased strength;Decreased activity tolerance;Decreased mobility;Decreased knowledge of use of DME;Cardiopulmonary status limiting activity  PT Treatment Interventions Gait training;Functional mobility training;Therapeutic exercise;Patient/family education   PT  Goals (Current goals can be found in the Care Plan section) Acute Rehab PT Goals Patient Stated Goal: return home with family PT Goal Formulation: With patient Time For Goal Achievement: 12/04/14 Potential to Achieve Goals: Good    Frequency Min 3X/week   Barriers to discharge   none                   End of Session Equipment Utilized During Treatment: Gait belt;Oxygen Activity Tolerance: Patient limited by fatigue Patient left: in chair;with call bell/phone within reach;with chair alarm set Nurse Communication: Mobility status         Time: 1129-1206 PT Time Calculation (min) (ACUTE ONLY): 37 min   Charges:   PT Evaluation $Initial PT Evaluation Tier I: 1 Procedure     PT G CodesSable Feil 11/20/2014, 12:18 PM

## 2014-11-20 NOTE — Care Management Note (Signed)
Case Management Note  Patient Details  Name: [name] MRN: [id number] Date of Birth: 1935/03/03  Subjective/Objective:                    Action/Plan:   Expected Discharge Date:                  Expected Discharge Plan:  [place]  In-House Referral:  NA  Discharge planning Services  CM Consult  Post Acute Care Choice:  Durable Medical Equipment Choice offered to:  Patient  DME Arranged:  Walker rolling, Oxygen DME Agency:  [place]:    [facility] Agency:     Status of Service:  In process, will continue to follow  Medicare Important Message Given:  Yes Date Medicare IM Given:  11/20/14 Medicare IM give by:  Christinia Gully, RN BSN CM Date Additional Medicare IM Given:    Additional Medicare Important Message give by:     If discussed at [place] of Stay Meetings, dates discussed:    Additional Comments: If pt discharges over the weekend, please arrange home O2 with agency of choice.  Christinia Gully [place], RN 11/20/2014, 2:55 PM

## 2014-11-20 NOTE — Progress Notes (Signed)
[facility] PROGRESS NOTE  [name] XBM:[id number] DOB: [birth year] DOA: 11/15/2014 PCP: Mickie Hillier, MD  Assessment/Plan: Acute respiratory failure with hypoxia: related to HCAP in setting of SCCa s/p chemo, radiation. Improving. CT reveals new left pleural effusion and progressive air space consolidation on left. [place] of pleural effusion revealed too small for thoracentesis. Unclear if progressive consolidation related to her lung can cancer.  Will request oncology consult for evaluation on need for bronchoscopy.  Active Problems:  HCAP (healthcare-associated pneumonia): in patient with hx SCCa. vanc and zosyn day #5. Max temp 98.9. Remains hemodynamically stable. Strep pneumo urine antigen and legionella urine antigen negative. Chest CT reveals progressive airspace consolidation involving the entire left lower lobe. given how slowly #1 improving and CT results will wait to narrow antibiotics.  See #1. Will mobilize.  AKI on chronic renal failure (acute kidney injury)stage II- chart review indicated baseline creatinine 1.5-1.9. Current creatinine remains at baseline. Continue to hold nephrotoxins. Monitor  Hyperglycemia: likely related to steroids. CBG 118.   COPD, moderate: stable.  see above. Home meds include spiriva and low dose prednisone. Continue nebs and steroids. .   Essential hypertension: Controlled. Holding home ACE inhibitor. monitor   Anemia, iron deficiency: followed by heme/onc. Stable   Squamous cell carcinoma of left lung: chart review indicates in remission. To be seen 02/2015 for 6 month check. See #1   Generalized anxiety disorder: stable at baseline. Verbalizes desire to go home.   Coronary artery disease: no chest pain. Continue home meds   Code Status: full Family Communication: at bedside Disposition Plan: home when ready   Consultants:  pulmonology  Procedures:  none  Antibiotics:  Vancomycin 11/15/14>>  Zosyn  11/15/14>>  HPI/Subjective: Reports breathing "Better". Denies pain/discomfort.   Objective: Filed Vitals:   11/20/14 0828  BP:   Pulse: 92  Temp:   Resp: 17    Intake/Output Summary (Last 24 hours) at 11/20/14 0930 Last data filed at 11/19/14 2211  Gross per 24 hour  Intake    720 ml  Output    100 ml  Net    620 ml   Filed Weights   11/15/14 2032 11/15/14 2358  Weight: 80.74 kg (178 lb) 80.967 kg (178 lb 8 oz)    Exam:   General:  Well nourished appears comfortable  Cardiovascular: RRR no MGR no LE edema PPP  Respiratory: normal effort with conversation, improved air movement on right and some on left. Fine crackles noted on left.   Abdomen: soft +BS non-tender to palpation  Musculoskeletal: joints without swelling or erythema   Data Reviewed: Basic Metabolic Panel:  Recent Labs Lab 11/16/14 0628 11/17/14 0502 11/18/14 0609 11/19/14 0622 11/20/14 0637  NA 138 140 139 140 141  K 3.4* 3.6 3.8 3.8 4.0  CL 107 107 109 104 104  CO2 '22 25 24 27 28  '$ GLUCOSE 150* 172* 161* 129* 118*  BUN '14 13 10 10 11  '$ CREATININE 1.86* 1.81* 1.76* 1.80* 1.82*  CALCIUM 8.8* 8.6* 9.0 8.7* 8.9   Liver Function Tests: No results for input(s): AST, ALT, ALKPHOS, BILITOT, PROT, ALBUMIN in the last 168 hours. No results for input(s): LIPASE, AMYLASE in the last 168 hours. No results for input(s): AMMONIA in the last 168 hours. CBC:  Recent Labs Lab 11/15/14 2105  11/16/14 0628 11/17/14 0502 11/18/14 0609 11/19/14 0622 11/20/14 0637  WBC 6.1  --  5.5 6.6 6.5 4.6 5.9  NEUTROABS 4.2  --   --   --   --   --   --  HGB 10.9*  < > 10.6* 9.7* 10.6* 10.4* 10.1*  HCT 34.0*  < > 33.2* 30.6* 33.2* 32.3* 31.9*  MCV 103.7*  --  103.4* 103.4* 103.1* 104.5* 104.6*  PLT 201  --  137* 219 252 257 279  < > = values in this interval not displayed. Cardiac Enzymes:  Recent Labs Lab 11/15/14 2105  TROPONINI 0.09*   BNP (last 3 results)  Recent Labs  11/15/14 2043  BNP 280.0*     ProBNP (last 3 results) No results for input(s): PROBNP in the last 8760 hours.  CBG: No results for input(s): GLUCAP in the last 168 hours.  Recent Results (from the past 240 hour(s))  Culture, blood (routine x 2)     Status: None (Preliminary result)   Collection Time: 11/15/14  8:56 PM  Result Value Ref Range Status   Specimen Description RIGHT ANTECUBITAL  Final   Special Requests BOTTLES DRAWN AEROBIC AND ANAEROBIC 6CC  Final   Culture NO GROWTH 4 DAYS  Final   Report Status PENDING  Incomplete  Culture, blood (routine x 2)     Status: None (Preliminary result)   Collection Time: 11/15/14  9:05 PM  Result Value Ref Range Status   Specimen Description LEFT ANTECUBITAL  Final   Special Requests   Final    BOTTLES DRAWN AEROBIC AND ANAEROBIC 6CC DRAWN BY RN   Culture NO GROWTH 4 DAYS  Final   Report Status PENDING  Incomplete     Studies: Dg Chest 2 View  11/18/2014   CLINICAL DATA:  Hypoxia with nonproductive cough  EXAM: CHEST  2 VIEW  COMPARISON:  11/15/2014  FINDINGS: Cardiac shadow is stable. A right chest wall port is again seen. Post radiation changes noted along the mediastinum particularly on the left but to a lesser degree on the right. Aortic calcifications are seen. Improved aeration in the left base is noted. No acute bony abnormality is seen  IMPRESSION: Chronic changes as described.  No acute abnormality is noted.   Electronically Signed   By: Inez Catalina M.D.   On: 11/18/2014 13:26   Ct Chest Wo Contrast  11/19/2014   CLINICAL DATA:  Hypoxia for 2 months.  Right side breast cancer.  EXAM: CT CHEST WITHOUT CONTRAST  TECHNIQUE: Multidetector CT imaging of the chest was performed following the standard protocol without IV contrast.  COMPARISON:  08/27/2014  FINDINGS: Mediastinum: There is moderate cardiac enlargement. Calcified atherosclerotic disease involves the thoracic aorta as well as the LAD, left circumflex coronary arteries. The trachea appears patent and is  midline. Normal appearance of the esophagus. No mediastinal or hilar adenopathy identified.  Lungs/Pleura: There is a left pleural effusion which appears new from previous exam. Next  Moderate to advanced changes of centrilobular and paraseptal emphysema identified.  Changes of external beam radiation are identified within both lungs in a paramediastinal distribution. There is progressive consolidation involving the almost all of the left lower lobe, image 44/series 2 and image number 77 of series 5 this obscures the previously described nonspecific nodular opacity in the posterior left lung. The previously identified subpleural nodule in the posterior right lung base is not seen on today's study.  Upper Abdomen: Normal appearance of the right adrenal gland. Left adrenal gland adenoma is again noted and appears unchanged from previous study, image 60/series 2. Within the left lobe of liver there is a 1.9 cm low-attenuation structure, image 55/series 2. There is a 1.1 cm stone within the gallbladder. This is  unchanged from previous exam. Aortic atherosclerosis noted. The infrarenal abdominal aorta measures up to 3 cm.  Musculoskeletal: Review of the visualized osseous structures is negative for aggressive lytic or sclerotic bone lesion.  IMPRESSION: 1. There is progressive airspace consolidation involving the entire left lower lobe. This is in an area of biopsy proven squamous cell carcinoma. Although these findings may reflect consolidation either due to progressive changes of external beam radiation or pneumonia recurrent tumor cannot be excluded. Consider further evaluation with bronchoscopy and tissue sampling. 2. New left pleural effusion. 3. Stable low-attenuation structure in left lobe of liver. 4. Aortic atherosclerosis including coronary artery calcification 5. Increased AP diameter of the infrarenal abdominal aorta which measures 3 cm. Recommend followup by ultrasound in 3 years. This recommendation follows  ACR consensus guidelines: White Paper of the ACR Incidental Findings Committee II on Vascular Findings. J Am Coll Radiol 2013; 17:793-903 6. Emphysema.   Electronically Signed   By: Kerby Moors M.D.   On: 11/19/2014 12:56   [place] Chest  11/19/2014   CLINICAL DATA:  Hypoxia.  EXAM: CHEST ULTRASOUND  COMPARISON:  CT 11/19/2014.  FINDINGS: Small left pleural effusion is noted. Effusion is not large enough for safe thoracentesis. Thoracentesis not performed .  IMPRESSION: Small left pleural effusion.  Thoracentesis not performed.   Electronically Signed   By: [place]   On: 11/19/2014 16:05    Scheduled Meds: . allopurinol  300 mg Oral Daily  . amLODipine  5 mg Oral Daily  . aspirin EC  81 mg Oral Daily  . enoxaparin (LOVENOX) injection  30 mg Subcutaneous Q24H  . fenofibrate  160 mg Oral Daily  . ferrous sulfate  325 mg Oral BID PC  . folic acid  1 mg Oral Daily  . hydrALAZINE  25 mg Oral TID  . ipratropium-albuterol  3 mL Nebulization TID  . levothyroxine  137 mcg Oral QAC breakfast  . pantoprazole  40 mg Oral Daily  . PARoxetine  30 mg Oral Daily  . piperacillin-tazobactam (ZOSYN)  IV  3.375 g Intravenous Q8H  . potassium chloride  10 mEq Oral Daily  . pravastatin  20 mg Oral Daily  . predniSONE  5 mg Oral BID WC  . vancomycin  1,000 mg Intravenous Q24H   Continuous Infusions: . sodium chloride 10 mL/hr at 11/19/14 0092    Principal Problem:   Acute respiratory failure with hypoxia Active Problems:   Chronic renal insufficiency   Generalized anxiety disorder   Coronary artery disease   Essential hypertension   Anemia, iron deficiency   Squamous cell carcinoma of left lung   AKI (acute kidney injury)   COPD, moderate   HCAP (healthcare-associated pneumonia)   Hypoxia   Pleural effusion    Time spent: 30 minutes    [facility] Pager [phone]. If 7PM-7AM, please contact night-coverage at [email or web address], password [facility] 11/20/2014, 9:30 AM   LOS: 5 days

## 2014-11-20 NOTE — Progress Notes (Signed)
SATURATION QUALIFICATIONS: (This note is used to comply with regulatory documentation for home oxygen)  Patient Saturations on Room Air at Rest = 83%  Patient Saturations on Room Air while Ambulating = %  Patient Saturations on 3 Liters of oxygen while Ambulating = %  Please briefly explain why patient needs home oxygen:  Assessment was stopped due to rest room air 83.

## 2014-11-20 NOTE — Consult Note (Signed)
Consult requested by: Dr. Sarajane Jews Consult requested for left lower lobe consolidation  HPI: This is a 80 year old who has known metastatic squamous cell carcinoma of the lung. She is undergoing treatment at the [facility]. She came into the hospital with acute respiratory failure with hypoxia. She is felt to have healthcare associated pneumonia. She had CT that shows progressive consolidation of the left lower lobe which is where she had her previous cancer. It is not clear by CT if this is related to her treatment or if she has increasing problems with her known lung cancer. She had been increasingly hypoxic but is better today and is now on 3 L of oxygen. She has no other new complaints she says she is still coughing. She is not coughing anything up. She denies any chest pain.  Past Medical History  Diagnosis Date  . Hypertension   . Hypothyroidism   . Hyperlipidemia   . Insomnia   . GERD (gastroesophageal reflux disease)     chronic gastritis  . Adrenal adenoma   . Chronic diarrhea   . Fatty liver   . Arthritis     knee  . QT prolongation     syncope  . Coronary artery disease   . Impaired fasting glucose   . Renal insufficiency     low protien diet  . Tobacco use     1/2 ppd, approx 25-50 pack years (as of 08/2012)  . Family history of heart disease   . IBS (irritable bowel syndrome)   . Peripheral arterial disease     sstatus post  infrarenal abdominal aortic tube graft placed by Dr. Victorino Dike February 2008  . Cancer     Lung  . Squamous cell carcinoma of left lung 07/29/2013  . HCAP (healthcare-associated pneumonia)     10/2014  . Acute respiratory failure     10/2014  . COPD (chronic obstructive pulmonary disease)     moderat  . Pleural effusion      Family History  Problem Relation Age of Onset  . Hypertension Mother   . Diabetes Mother   . Heart attack Mother   . Hyperlipidemia Sister     x3 sister  . Kidney disease Brother      History    Social History  . Marital Status: Divorced    Spouse Name: N/A  . Number of Children: 2  . Years of Education: N/A   Occupational History  .     Social History Main Topics  . Smoking status: Former Smoker -- 0.50 packs/day for 59 years    Types: Cigarettes  . Smokeless tobacco: Never Used     Comment: smoking since age [age]  . Alcohol Use: No  . Drug Use: No  . Sexual Activity: Not on file   Other Topics Concern  . None   Social History Narrative     ROS: She denies chest pain hemoptysis fever or chills. The rest per the history and physical    Objective: Vital signs in last 24 hours: Temp:  [98.3 F (36.8 C)-99.1 F (37.3 C)] 98.3 F (36.8 C) (05/27 0006) Pulse Rate:  [80-100] 92 (05/27 0828) Resp:  [17-30] 17 (05/27 0828) BP: (122-141)/(68-73) 136/73 mmHg (05/27 0006) SpO2:  [85 %-97 %] 95 % (05/27 0828) Weight change:  Last BM Date: 11/18/14  Intake/Output from previous day: 05/26 0701 - 05/27 0700 In: 720 [P.O.:720] Out: 100 [Urine:100]  PHYSICAL EXAM She looks comfortable. She is on nasal  oxygen. Her HEENT examination is unremarkable. Her neck is supple without masses. Her chest shows diminished breath sounds on the left. Her heart is regular. Her abdomen is soft. She does not have any edema. Central nervous system examination is grossly intact  Lab Results: Basic Metabolic Panel:  Recent Labs  11/19/14 0622 11/20/14 0637  NA 140 141  K 3.8 4.0  CL 104 104  CO2 27 28  GLUCOSE 129* 118*  BUN 10 11  CREATININE 1.80* 1.82*  CALCIUM 8.7* 8.9   Liver Function Tests: No results for input(s): AST, ALT, ALKPHOS, BILITOT, PROT, ALBUMIN in the last 72 hours. No results for input(s): LIPASE, AMYLASE in the last 72 hours. No results for input(s): AMMONIA in the last 72 hours. CBC:  Recent Labs  11/19/14 0622 11/20/14 0637  WBC 4.6 5.9  HGB 10.4* 10.1*  HCT 32.3* 31.9*  MCV 104.5* 104.6*  PLT 257 279   Cardiac Enzymes: No results for  input(s): CKTOTAL, CKMB, CKMBINDEX, TROPONINI in the last 72 hours. BNP: No results for input(s): PROBNP in the last 72 hours. D-Dimer: No results for input(s): DDIMER in the last 72 hours. CBG: No results for input(s): GLUCAP in the last 72 hours. Hemoglobin A1C: No results for input(s): HGBA1C in the last 72 hours. Fasting Lipid Panel: No results for input(s): CHOL, HDL, LDLCALC, TRIG, CHOLHDL, LDLDIRECT in the last 72 hours. Thyroid Function Tests: No results for input(s): TSH, T4TOTAL, FREET4, T3FREE, THYROIDAB in the last 72 hours. Anemia Panel: No results for input(s): VITAMINB12, FOLATE, FERRITIN, TIBC, IRON, RETICCTPCT in the last 72 hours. Coagulation: No results for input(s): LABPROT, INR in the last 72 hours. Urine Drug Screen: Drugs of Abuse  No results found for: LABOPIA, COCAINSCRNUR, LABBENZ, AMPHETMU, THCU, LABBARB  Alcohol Level: No results for input(s): ETH in the last 72 hours. Urinalysis: No results for input(s): COLORURINE, LABSPEC, PHURINE, GLUCOSEU, HGBUR, BILIRUBINUR, KETONESUR, PROTEINUR, UROBILINOGEN, NITRITE, LEUKOCYTESUR in the last 72 hours.  Invalid input(s): APPERANCEUR Misc. Labs:   ABGS: No results for input(s): PHART, PO2ART, TCO2, HCO3 in the last 72 hours.  Invalid input(s): PCO2   MICROBIOLOGY: Recent Results (from the past 240 hour(s))  Culture, blood (routine x 2)     Status: None (Preliminary result)   Collection Time: 11/15/14  8:56 PM  Result Value Ref Range Status   Specimen Description RIGHT ANTECUBITAL  Final   Special Requests BOTTLES DRAWN AEROBIC AND ANAEROBIC 6CC  Final   Culture NO GROWTH 4 DAYS  Final   Report Status PENDING  Incomplete  Culture, blood (routine x 2)     Status: None (Preliminary result)   Collection Time: 11/15/14  9:05 PM  Result Value Ref Range Status   Specimen Description LEFT ANTECUBITAL  Final   Special Requests   Final    BOTTLES DRAWN AEROBIC AND ANAEROBIC 6CC DRAWN BY RN   Culture NO GROWTH  4 DAYS  Final   Report Status PENDING  Incomplete    Studies/Results: Dg Chest 2 View  11/18/2014   CLINICAL DATA:  Hypoxia with nonproductive cough  EXAM: CHEST  2 VIEW  COMPARISON:  11/15/2014  FINDINGS: Cardiac shadow is stable. A right chest wall port is again seen. Post radiation changes noted along the mediastinum particularly on the left but to a lesser degree on the right. Aortic calcifications are seen. Improved aeration in the left base is noted. No acute bony abnormality is seen  IMPRESSION: Chronic changes as described.  No acute abnormality is noted.  Electronically Signed   By: Inez Catalina M.D.   On: 11/18/2014 13:26   Ct Chest Wo Contrast  11/19/2014   CLINICAL DATA:  Hypoxia for 2 months.  Right side breast cancer.  EXAM: CT CHEST WITHOUT CONTRAST  TECHNIQUE: Multidetector CT imaging of the chest was performed following the standard protocol without IV contrast.  COMPARISON:  08/27/2014  FINDINGS: Mediastinum: There is moderate cardiac enlargement. Calcified atherosclerotic disease involves the thoracic aorta as well as the LAD, left circumflex coronary arteries. The trachea appears patent and is midline. Normal appearance of the esophagus. No mediastinal or hilar adenopathy identified.  Lungs/Pleura: There is a left pleural effusion which appears new from previous exam. Next  Moderate to advanced changes of centrilobular and paraseptal emphysema identified.  Changes of external beam radiation are identified within both lungs in a paramediastinal distribution. There is progressive consolidation involving the almost all of the left lower lobe, image 44/series 2 and image number 77 of series 5 this obscures the previously described nonspecific nodular opacity in the posterior left lung. The previously identified subpleural nodule in the posterior right lung base is not seen on today's study.  Upper Abdomen: Normal appearance of the right adrenal gland. Left adrenal gland adenoma is again  noted and appears unchanged from previous study, image 60/series 2. Within the left lobe of liver there is a 1.9 cm low-attenuation structure, image 55/series 2. There is a 1.1 cm stone within the gallbladder. This is unchanged from previous exam. Aortic atherosclerosis noted. The infrarenal abdominal aorta measures up to 3 cm.  Musculoskeletal: Review of the visualized osseous structures is negative for aggressive lytic or sclerotic bone lesion.  IMPRESSION: 1. There is progressive airspace consolidation involving the entire left lower lobe. This is in an area of biopsy proven squamous cell carcinoma. Although these findings may reflect consolidation either due to progressive changes of external beam radiation or pneumonia recurrent tumor cannot be excluded. Consider further evaluation with bronchoscopy and tissue sampling. 2. New left pleural effusion. 3. Stable low-attenuation structure in left lobe of liver. 4. Aortic atherosclerosis including coronary artery calcification 5. Increased AP diameter of the infrarenal abdominal aorta which measures 3 cm. Recommend followup by ultrasound in 3 years. This recommendation follows ACR consensus guidelines: White Paper of the ACR Incidental Findings Committee II on Vascular Findings. J Am Coll Radiol 2013; 40:981-191 6. Emphysema.   Electronically Signed   By: Kerby Moors M.D.   On: 11/19/2014 12:56   [place] Chest  11/19/2014   CLINICAL DATA:  Hypoxia.  EXAM: CHEST ULTRASOUND  COMPARISON:  CT 11/19/2014.  FINDINGS: Small left pleural effusion is noted. Effusion is not large enough for safe thoracentesis. Thoracentesis not performed .  IMPRESSION: Small left pleural effusion.  Thoracentesis not performed.   Electronically Signed   By: Marcello Moores  Register   On: 11/19/2014 16:05    Medications:  Prior to Admission:  Prescriptions prior to admission  Medication Sig Dispense Refill Last Dose  . allopurinol (ZYLOPRIM) 300 MG tablet Take 1 tablet (300 mg total) by mouth  daily. 30 tablet 5 11/15/2014 at Unknown time  . ALPRAZolam (XANAX) 1 MG tablet TAKE ONE-HALF TO ONE TABLET BY MOUTH THREE TIMES DAILY AS NEEDED FOR ANXIETY 90 tablet 5 11/15/2014 at Unknown time  . amLODipine (NORVASC) 5 MG tablet take 1 tablet by mouth once daily 30 tablet 2 11/15/2014 at Unknown time  . aspirin EC 81 MG tablet Take 81 mg by mouth daily.   11/15/2014 at  Unknown time  . cyanocobalamin (,VITAMIN B-12,) 1000 MCG/ML injection Inject 1,000 mcg into the muscle once.   Past Month at Unknown time  . diphenoxylate-atropine (LOMOTIL) 2.5-0.025 MG per tablet Take 1 tablet by mouth 4 (four) times daily as needed for diarrhea or loose stools. (Patient taking differently: Take 1 tablet by mouth 4 (four) times daily. ) 60 tablet 2 11/15/2014 at Unknown time  . enalapril (VASOTEC) 10 MG tablet Take 1 tablet (10 mg total) by mouth daily. 30 tablet 5 11/15/2014 at Unknown time  . esomeprazole (NEXIUM) 40 MG capsule take 1 capsule by mouth once daily 30 capsule 5 11/15/2014 at Unknown time  . fenofibrate 160 MG tablet take 1 tablet by mouth once daily with food 30 tablet 2 11/15/2014 at Unknown time  . ferrous sulfate (FERROUSUL) 325 (65 FE) MG tablet Take 1 tablet (325 mg total) by mouth 2 (two) times daily after a meal.  3 11/15/2014 at Unknown time  . folic acid (FOLVITE) 1 MG tablet Take 1 mg by mouth daily.  1 11/15/2014 at Unknown time  . hydrALAZINE (APRESOLINE) 25 MG tablet take 1 tablet by mouth three times a day 90 tablet 5 11/15/2014 at Unknown time  . levothyroxine (SYNTHROID, LEVOTHROID) 137 MCG tablet Take 1 tablet (137 mcg total) by mouth daily. 30 tablet 5 11/15/2014 at Unknown time  . PARoxetine (PAXIL) 30 MG tablet take 1 tablet by mouth once daily 30 tablet 5 11/15/2014 at Unknown time  . potassium chloride 20 MEQ TBCR Take 10 mEq by mouth daily. 30 tablet 6 11/15/2014 at Unknown time  . pravastatin (PRAVACHOL) 20 MG tablet take 1 tablet by mouth once daily 30 tablet 3 11/15/2014 at Unknown time   . predniSONE (DELTASONE) 5 MG tablet Take 1 tablet (5 mg total) by mouth 2 (two) times daily with a meal. 60 tablet 2 11/15/2014 at Unknown time  . SPIRIVA HANDIHALER 18 MCG inhalation capsule Place 18 mcg into inhaler and inhale daily.   0 11/15/2014 at Unknown time  . temazepam (RESTORIL) 30 MG capsule Take 1 capsule (30 mg total) by mouth at bedtime as needed for sleep. 30 capsule 2 Past Week at Unknown time  . traMADol (ULTRAM) 50 MG tablet Take 1 tablet (50 mg total) by mouth every 6 (six) hours as needed (pain). 30 tablet 5 Past Week at Unknown time  . Vitamin D, Ergocalciferol, (DRISDOL) 50000 UNITS CAPS capsule Take 50,000 Units by mouth once a week.  1 Past Week at Unknown time   Scheduled: . allopurinol  300 mg Oral Daily  . amLODipine  5 mg Oral Daily  . aspirin EC  81 mg Oral Daily  . enoxaparin (LOVENOX) injection  30 mg Subcutaneous Q24H  . fenofibrate  160 mg Oral Daily  . ferrous sulfate  325 mg Oral BID PC  . folic acid  1 mg Oral Daily  . hydrALAZINE  25 mg Oral TID  . ipratropium-albuterol  3 mL Nebulization TID  . levothyroxine  137 mcg Oral QAC breakfast  . pantoprazole  40 mg Oral Daily  . PARoxetine  30 mg Oral Daily  . piperacillin-tazobactam (ZOSYN)  IV  3.375 g Intravenous Q8H  . potassium chloride  10 mEq Oral Daily  . pravastatin  20 mg Oral Daily  . predniSONE  5 mg Oral BID WC  . vancomycin  1,000 mg Intravenous Q24H   Continuous: . sodium chloride 10 mL/hr at 11/19/14 2993   ZJI:[id number] **OR** acetaminophen, ALPRAZolam, alum &  mag hydroxide-simeth, ondansetron **OR** ondansetron (ZOFRAN) IV, oxyCODONE, temazepam  Assesment: She has an acute hypoxic respiratory failure and although she had been having more trouble she is now somewhat improved. She is known to have squamous cell carcinoma of the left lung and her CT shows increased consolidation in that area. This may be a result of treatment but it is not clear. She does have healthcare associated  pneumonia and seems to be improving slowly. She has some baseline COPD. She has a small left pleural effusion which does not need thoracentesis because it's too small. Principal Problem:   Acute respiratory failure with hypoxia Active Problems:   Chronic renal insufficiency   Generalized anxiety disorder   Coronary artery disease   Essential hypertension   Anemia, iron deficiency   Squamous cell carcinoma of left lung   AKI (acute kidney injury)   COPD, moderate   HCAP (healthcare-associated pneumonia)   Hypoxia   Pleural effusion    Plan: I agree that bronchoscopy now would not change treatment. I would continue with her current treatments particularly since she has improved. She could have outpatient bronchoscopy if needed assuming that she continues to improve and is able to go home. If bronchoscopy would not change her therapy from the oncologist that I probably would not pursue that. I will be out of town until the 31st    LOS: 5 days   Ferrell Flam L 11/20/2014, 8:34 AM

## 2014-11-20 NOTE — Progress Notes (Signed)
DISH for Vancomycin and Zosyn Indication: pneumonia  Allergies  Allergen Reactions  . Aleve [Naproxen Sodium] Other (See Comments)    GI bleed  . Dilaudid [Hydromorphone Hcl]     Can not tolerate  . Dyazide [Hydrochlorothiazide W-Triamterene] Other (See Comments)    cramps  . Zithromax [Azithromycin] Itching   Patient Measurements: Height: '5\' 7"'$  (170.2 cm) Weight: 178 lb 8 oz (80.967 kg) IBW/kg (Calculated) : 61.6  Vital Signs: Temp: 98.3 F (36.8 C) (05/27 0006) Temp Source: Oral (05/27 0006) BP: 136/73 mmHg (05/27 0006) Pulse Rate: 87 (05/27 0006) Intake/Output from previous day: 05/26 0701 - 05/27 0700 In: 720 [P.O.:720] Out: 100 [Urine:100] Intake/Output from this shift:    Labs:  Recent Labs  11/18/14 0609 11/19/14 0622 11/20/14 0637  WBC 6.5 4.6 5.9  HGB 10.6* 10.4* 10.1*  PLT 252 257 279  CREATININE 1.76* 1.80* 1.82*   Estimated Creatinine Clearance: 27.9 mL/min (by C-G formula based on Cr of 1.82).  Recent Labs  11/20/14 0637  [facility] 14     Microbiology: Recent Results (from the past 720 hour(s))  Culture, blood (routine x 2)     Status: None (Preliminary result)   Collection Time: 11/15/14  8:56 PM  Result Value Ref Range Status   Specimen Description RIGHT ANTECUBITAL  Final   Special Requests BOTTLES DRAWN AEROBIC AND ANAEROBIC 6CC  Final   Culture NO GROWTH 4 DAYS  Final   Report Status PENDING  Incomplete  Culture, blood (routine x 2)     Status: None (Preliminary result)   Collection Time: 11/15/14  9:05 PM  Result Value Ref Range Status   Specimen Description LEFT ANTECUBITAL  Final   Special Requests   Final    BOTTLES DRAWN AEROBIC AND ANAEROBIC 6CC DRAWN BY RN   Culture NO GROWTH 4 DAYS  Final   Report Status PENDING  Incomplete    Medical History: Past Medical History  Diagnosis Date  . Hypertension   . Hypothyroidism   . Hyperlipidemia   . Insomnia   . GERD (gastroesophageal  reflux disease)     chronic gastritis  . Adrenal adenoma   . Chronic diarrhea   . Fatty liver   . Arthritis     knee  . QT prolongation     syncope  . Coronary artery disease   . Impaired fasting glucose   . Renal insufficiency     low protien diet  . Tobacco use     1/2 ppd, approx 25-50 pack years (as of 08/2012)  . Family history of heart disease   . IBS (irritable bowel syndrome)   . Peripheral arterial disease     sstatus post  infrarenal abdominal aortic tube graft placed by Dr. Victorino Dike February 2008  . Cancer     Lung  . Squamous cell carcinoma of left lung 07/29/2013  . HCAP (healthcare-associated pneumonia)     10/2014  . Acute respiratory failure     10/2014  . COPD (chronic obstructive pulmonary disease)     moderat  . Pleural effusion    Anti-infectives    Start     Dose/Rate Route Frequency Ordered Stop   11/17/14 0600  vancomycin (VANCOCIN) IVPB 1000 mg/200 mL premix     1,000 mg 200 mL/hr over 60 Minutes Intravenous Every 24 hours 11/16/14 0748     11/16/14 1400  piperacillin-tazobactam (ZOSYN) IVPB 3.375 g     3.375 g 12.5 mL/hr over 240  Minutes Intravenous Every 8 hours 11/16/14 0747     11/16/14 1000  vancomycin (VANCOCIN) 500 mg in sodium chloride 0.9 % 100 mL IVPB     500 mg 100 mL/hr over 60 Minutes Intravenous  Once 11/16/14 0745 11/16/14 1113   11/16/14 0600  piperacillin-tazobactam (ZOSYN) IVPB 3.375 g     3.375 g 12.5 mL/hr over 240 Minutes Intravenous  Once 11/16/14 0018 11/16/14 0957   11/15/14 2145  vancomycin (VANCOCIN) IVPB 1000 mg/200 mL premix     1,000 mg 200 mL/hr over 60 Minutes Intravenous  Once 11/15/14 2144 11/16/14 0105   11/15/14 2145  piperacillin-tazobactam (ZOSYN) IVPB 3.375 g     3.375 g 100 mL/hr over 30 Minutes Intravenous  Once 11/15/14 2144 11/15/14 2224     Assessment: 80 yo F who presented to the ED with complaints of fever, chills, cough and increased SOB x 3 days. She was found to have hypoxia with an O2  saturation of 86%.Chest X-ray revealed worsening Left Basilar opacities. She was started on IV Vancomycin and Zosyn.  She is currently on on day#5 antibiotics.  She is clinically unimproved- worsening consolidation per chest CT and hypoxia.   She is afebrile with normal WBC. Renal has been relatively stable.   Vancomycin trough at low end of goal range today.   Goal of Therapy:  Vancomycin trough level 15-20 mcg/ml  Plan:  Continue Zosyn 3.375gm IV q8h Continue Vancomycin '1000mg'$  IV q24hrs Weekly Vancomycin trough  Monitor patient progress, renal function and cx data Duration of therapy per MD- de-escalate ABX when clinically appropriate  [name], Lavonia Drafts 11/20/2014,8:02 AM

## 2014-11-21 LAB — CULTURE, BLOOD (ROUTINE X 2)
CULTURE: NO GROWTH
CULTURE: NO GROWTH

## 2014-11-21 MED ORDER — AMOXICILLIN-POT CLAVULANATE 500-125 MG PO TABS: 500.0000 mg | ORAL_TABLET | Freq: Two times a day (BID) | ORAL | Status: DC

## 2014-11-21 NOTE — Discharge Summary (Signed)
Physician Discharge Summary  Kuulei Kleier KWI:[id number] DOB: 1935-05-14 DOA: 11/15/2014  PCP: Mickie Hillier, MD  Admit date: 11/15/2014 Discharge date: 11/21/2014  Recommendations for Outpatient Follow-up:  1. Resolution of pneumonia. See discussion below. 2. Acute hypoxic respiratory failure, now on oxygen. Hopefully this can be discontinued in the next few weeks. 3. Abdominal aortic aneurysm, consider repeat ultrasound 3 years.   Follow-up Information    Follow up with Mickie Hillier, MD. Schedule an appointment as soon as possible for a visit in 1 week.   Specialty:  Family Medicine   Contact information:   [address] [place] [address] [phone]       Follow up with KEFALAS,THOMAS, PA-C.   Specialty:  Physician Assistant   Why:  office will call you with an appointment   Contact information:   [place] [address] [phone]      Discharge Diagnoses:  1. Acute hypoxic respiratory failure 2. HCAP 3. COPD 4. Acute kidney injury 5. Chronic kidney disease stage IV 6. Iron deficiency anemia 7. Squamous cell carcinoma of the left lung 8. Tobacco dependence in remission 9. Aortic atherosclerosis including coronary artery calcification 10. Incidental finding of abdominal aortic aneurysm.  Discharge Condition: improved Disposition: home  Diet recommendation: regular  Filed Weights   11/15/14 2032 11/15/14 2358  Weight: 80.74 kg (178 lb) 80.967 kg (178 lb 8 oz)    History of present illness:  80 year old woman with history of lung cancer treated with chemotherapy and radiation, tobacco dependence in remission, no history of hypoxia who presented with several day 2 week history of increasing shortness of breath, productive cough, multiple episodes of nausea and vomiting. She was found to have acute hypoxic respiratory failure with increased work of breathing in the emergency department and admitted for HCAP, hypoxia, acute kidney  injury.  Hospital Course:  Ms. Heyward was started on IV antibiotics and supplemental oxygen. She was very slow to improve but at the time of discharge was stable on nasal cannula oxygen and improving. Her hospitalization was uncomplicated individual issues as below.  1. Acute hypoxic respiratory failure secondary to HCAP. Pleural effusion was too small for thoracentesis. Overall continuing to improve. 2. HCAP. Much improved, stable on oxygen. Complete antibiotics. 3. COPD, stable. 4. Acute kidney injury superimposed on chronic kidney disease stage IV. 5. Iron deficiency anemia, stable. 6. Squamous cell carcinoma of the left lung. 7. Tobacco dependence in remission 8. Aortic atherosclerosis including coronary artery calcification 9. Increased AP diameter of the infrarenal abdominal aorta which measures 3 cm. Recommend followup by ultrasound in 3 years.   Plan home today on Augmentin, home oxygen.  Discussed with Dr. Luan Pulling as well as Gershon Mussel at the cancer center. We do not feel that bronchoscopy is indicated at this point forward change management. Tom reports recent CT scans looked fairly good and he would be surprised if this is recurrent malignancy. Will continue antibiotics,, will arrange follow-up with the [facility] in the next 2 weeks.  Discussed with daughter at bedside  Consultants:  Pulmonology  Procedures:    Antibiotics:  Vancomycin 11/15/14>> 5/28  Zosyn 11/15/14>> 5/28  Augmentin 5/28 >> 6/4  Discharge Instructions  Discharge Instructions    Diet - low sodium heart healthy    Complete by:  As directed      Discharge instructions    Complete by:  As directed   Call your physician or seek immediate medical attention for fever, shortness of breath or worsening  of condition.     Increase activity slowly    Complete by:  As directed           Current Discharge Medication List    START taking these medications   Details  amoxicillin-clavulanate  (AUGMENTIN) 500-125 MG per tablet Take 1 tablet (500 mg total) by mouth 2 (two) times daily. Qty: 14 tablet, Refills: 0      CONTINUE these medications which have NOT CHANGED   Details  allopurinol (ZYLOPRIM) 300 MG tablet Take 1 tablet (300 mg total) by mouth daily. Qty: 30 tablet, Refills: 5    ALPRAZolam (XANAX) 1 MG tablet TAKE ONE-HALF TO ONE TABLET BY MOUTH THREE TIMES DAILY AS NEEDED FOR ANXIETY Qty: 90 tablet, Refills: 5    amLODipine (NORVASC) 5 MG tablet take 1 tablet by mouth once daily Qty: 30 tablet, Refills: 2    aspirin EC 81 MG tablet Take 81 mg by mouth daily.    cyanocobalamin (,VITAMIN B-12,) 1000 MCG/ML injection Inject 1,000 mcg into the muscle once.    diphenoxylate-atropine (LOMOTIL) 2.5-0.025 MG per tablet Take 1 tablet by mouth 4 (four) times daily as needed for diarrhea or loose stools. Qty: 60 tablet, Refills: 2   Associated Diagnoses: Loose stools    enalapril (VASOTEC) 10 MG tablet Take 1 tablet (10 mg total) by mouth daily. Qty: 30 tablet, Refills: 5    esomeprazole (NEXIUM) 40 MG capsule take 1 capsule by mouth once daily Qty: 30 capsule, Refills: 5    ferrous sulfate (FERROUSUL) 325 (65 FE) MG tablet Take 1 tablet (325 mg total) by mouth 2 (two) times daily after a meal. Refills: 3    folic acid (FOLVITE) 1 MG tablet Take 1 mg by mouth daily. Refills: 1    hydrALAZINE (APRESOLINE) 25 MG tablet take 1 tablet by mouth three times a day Qty: 90 tablet, Refills: 5    levothyroxine (SYNTHROID, LEVOTHROID) 137 MCG tablet Take 1 tablet (137 mcg total) by mouth daily. Qty: 30 tablet, Refills: 5    PARoxetine (PAXIL) 30 MG tablet take 1 tablet by mouth once daily Qty: 30 tablet, Refills: 5    potassium chloride 20 MEQ TBCR Take 10 mEq by mouth daily. Qty: 30 tablet, Refills: 6    pravastatin (PRAVACHOL) 20 MG tablet take 1 tablet by mouth once daily Qty: 30 tablet, Refills: 3    predniSONE (DELTASONE) 5 MG tablet Take 1 tablet (5 mg total) by  mouth 2 (two) times daily with a meal. Qty: 60 tablet, Refills: 2   Associated Diagnoses: Failure to thrive in adult    SPIRIVA HANDIHALER 18 MCG inhalation capsule Place 18 mcg into inhaler and inhale daily.  Refills: 0    temazepam (RESTORIL) 30 MG capsule Take 1 capsule (30 mg total) by mouth at bedtime as needed for sleep. Qty: 30 capsule, Refills: 2   Associated Diagnoses: Insomnia    traMADol (ULTRAM) 50 MG tablet Take 1 tablet (50 mg total) by mouth every 6 (six) hours as needed (pain). Qty: 30 tablet, Refills: 5    Vitamin D, Ergocalciferol, (DRISDOL) 50000 UNITS CAPS capsule Take 50,000 Units by mouth once a week. Refills: 1      STOP taking these medications     fenofibrate 160 MG tablet        Allergies  Allergen Reactions  . Aleve [Naproxen Sodium] Other (See Comments)    GI bleed  . Dilaudid [Hydromorphone Hcl]     Can not tolerate  . Dyazide [  Hydrochlorothiazide W-Triamterene] Other (See Comments)    cramps  . Zithromax [Azithromycin] Itching    The results of significant diagnostics from this hospitalization (including imaging, microbiology, ancillary and laboratory) are listed below for reference.    Significant Diagnostic Studies: Dg Chest 2 View  11/18/2014   CLINICAL DATA:  Hypoxia with nonproductive cough  EXAM: CHEST  2 VIEW  COMPARISON:  11/15/2014  FINDINGS: Cardiac shadow is stable. A right chest wall port is again seen. Post radiation changes noted along the mediastinum particularly on the left but to a lesser degree on the right. Aortic calcifications are seen. Improved aeration in the left base is noted. No acute bony abnormality is seen  IMPRESSION: Chronic changes as described.  No acute abnormality is noted.   Electronically Signed   By: Inez Catalina M.D.   On: 11/18/2014 13:26   Ct Chest Wo Contrast  11/19/2014   CLINICAL DATA:  Hypoxia for 2 months.  Right side breast cancer.  EXAM: CT CHEST WITHOUT CONTRAST  TECHNIQUE: Multidetector CT  imaging of the chest was performed following the standard protocol without IV contrast.  COMPARISON:  08/27/2014  FINDINGS: Mediastinum: There is moderate cardiac enlargement. Calcified atherosclerotic disease involves the thoracic aorta as well as the LAD, left circumflex coronary arteries. The trachea appears patent and is midline. Normal appearance of the esophagus. No mediastinal or hilar adenopathy identified.  Lungs/Pleura: There is a left pleural effusion which appears new from previous exam. Next  Moderate to advanced changes of centrilobular and paraseptal emphysema identified.  Changes of external beam radiation are identified within both lungs in a paramediastinal distribution. There is progressive consolidation involving the almost all of the left lower lobe, image 44/series 2 and image number 77 of series 5 this obscures the previously described nonspecific nodular opacity in the posterior left lung. The previously identified subpleural nodule in the posterior right lung base is not seen on today's study.  Upper Abdomen: Normal appearance of the right adrenal gland. Left adrenal gland adenoma is again noted and appears unchanged from previous study, image 60/series 2. Within the left lobe of liver there is a 1.9 cm low-attenuation structure, image 55/series 2. There is a 1.1 cm stone within the gallbladder. This is unchanged from previous exam. Aortic atherosclerosis noted. The infrarenal abdominal aorta measures up to 3 cm.  Musculoskeletal: Review of the visualized osseous structures is negative for aggressive lytic or sclerotic bone lesion.  IMPRESSION: 1. There is progressive airspace consolidation involving the entire left lower lobe. This is in an area of biopsy proven squamous cell carcinoma. Although these findings may reflect consolidation either due to progressive changes of external beam radiation or pneumonia recurrent tumor cannot be excluded. Consider further evaluation with bronchoscopy  and tissue sampling. 2. New left pleural effusion. 3. Stable low-attenuation structure in left lobe of liver. 4. Aortic atherosclerosis including coronary artery calcification 5. Increased AP diameter of the infrarenal abdominal aorta which measures 3 cm. Recommend followup by ultrasound in 3 years. This recommendation follows ACR consensus guidelines: White Paper of the ACR Incidental Findings Committee II on Vascular Findings. J Am Coll Radiol 2013; 08:676-195 6. Emphysema.   Electronically Signed   By: Kerby Moors M.D.   On: 11/19/2014 12:56   [place] Chest  11/19/2014   CLINICAL DATA:  Hypoxia.  EXAM: CHEST ULTRASOUND  COMPARISON:  CT 11/19/2014.  FINDINGS: Small left pleural effusion is noted. Effusion is not large enough for safe thoracentesis. Thoracentesis not performed .  IMPRESSION:  Small left pleural effusion.  Thoracentesis not performed.   Electronically Signed   By: [place]   On: 11/19/2014 16:05   Dg Chest Port 1 View  11/15/2014   CLINICAL DATA:  Dizziness, shortness of breath. History of lung cancer.  EXAM: PORTABLE CHEST - 1 VIEW  COMPARISON:  02/24/2014; chest CT - 08/27/2014  FINDINGS: Lung volumes are reduced. Grossly unchanged enlarged cardiac silhouette and mediastinal contours with atherosclerotic plaque within the thoracic aorta. Worsening left basilar heterogeneous/consolidative opacities. Pulmonary is congestion without frank evidence of edema. Trace left-sided effusion is not excluded. No pneumothorax. Stable position of support apparatus. No acute osseus abnormalities.  IMPRESSION: Worsening left basilar opacities, atelectasis versus infiltrate, potentially accentuated due to decreased lung volumes. Further evaluation with a PA and lateral chest radiograph may be obtained as clinically indicated.   Electronically Signed   By: Sandi Mariscal M.D.   On: 11/15/2014 21:27    Microbiology: Recent Results (from the past 240 hour(s))  Culture, blood (routine x 2)     Status: None    Collection Time: 11/15/14  8:56 PM  Result Value Ref Range Status   Specimen Description RIGHT ANTECUBITAL  Final   Special Requests BOTTLES DRAWN AEROBIC AND ANAEROBIC [facility]  Final   Culture NO GROWTH 6 DAYS  Final   Report Status 11/21/2014 FINAL  Final  Culture, blood (routine x 2)     Status: None   Collection Time: 11/15/14  9:05 PM  Result Value Ref Range Status   Specimen Description LEFT ANTECUBITAL  Final   Special Requests   Final    BOTTLES DRAWN AEROBIC AND ANAEROBIC 6CC DRAWN BY RN   Culture NO GROWTH 6 DAYS  Final   Report Status 11/21/2014 FINAL  Final     Labs: Basic Metabolic Panel:  Recent Labs Lab 11/16/14 0628 11/17/14 0502 11/18/14 0609 11/19/14 0622 11/20/14 0637  NA 138 140 139 140 141  K 3.4* 3.6 3.8 3.8 4.0  CL 107 107 109 104 104  CO2 '22 25 24 27 28  '$ GLUCOSE 150* 172* 161* 129* 118*  BUN '14 13 10 10 11  '$ CREATININE 1.86* 1.81* 1.76* 1.80* 1.82*  CALCIUM 8.8* 8.6* 9.0 8.7* 8.9   CBC:  Recent Labs Lab 11/15/14 2105  11/16/14 0628 11/17/14 0502 11/18/14 0609 11/19/14 0622 11/20/14 0637  WBC 6.1  --  5.5 6.6 6.5 4.6 5.9  NEUTROABS 4.2  --   --   --   --   --   --   HGB 10.9*  < > 10.6* 9.7* 10.6* 10.4* 10.1*  HCT 34.0*  < > 33.2* 30.6* 33.2* 32.3* 31.9*  MCV 103.7*  --  103.4* 103.4* 103.1* 104.5* 104.6*  PLT 201  --  137* 219 252 257 279  < > = values in this interval not displayed. Cardiac Enzymes:  Recent Labs Lab 11/15/14 2105  TROPONINI 0.09*     Recent Labs  11/15/14 2043  BNP 280.0*     Principal Problem:   Acute respiratory failure with hypoxia Active Problems:   Chronic renal insufficiency   Generalized anxiety disorder   Coronary artery disease   Essential hypertension   Anemia, iron deficiency   Squamous cell carcinoma of left lung   AKI (acute kidney injury)   COPD, moderate   HCAP (healthcare-associated pneumonia)   Hypoxia   Pleural effusion   Hyperglycemia   Time coordinating discharge: 35  minutes  Signed:  Murray Hodgkins, MD [facility] 11/21/2014, 4:06  PM

## 2014-11-21 NOTE — Progress Notes (Signed)
PROGRESS NOTE  [name] HER:[id number] DOB: 11/05/1934 DOA: 11/15/2014 PCP: Mickie Hillier, MD  Summary: 80 year old woman with history of lung cancer treated with chemotherapy and radiation, tobacco dependence in remission, no history of hypoxia who presented with several day 2 week history of increasing shortness of breath, productive cough, multiple episodes of nausea and vomiting. She was found to have acute hypoxic respiratory failure with increased work of breathing in the emergency department and admitted for HCAP, hypoxia, acute kidney injury.  Assessment/Plan: 1. Acute hypoxic respiratory failure secondary to HCAP. Pleural effusion was too small for thoracentesis. Overall continuing to improve. 2. HCAP. Much improved, stable on oxygen. Complete antibiotics. 3. COPD, stable. 4. Acute kidney injury superimposed on chronic kidney disease stage IV. 5. Iron deficiency anemia, stable. 6. Squamous cell carcinoma of the left lung. 7. Tobacco dependence in remission 8. Aortic atherosclerosis including coronary artery calcification 9. Increased AP diameter of the infrarenal abdominal aorta which measures 3 cm. Recommend followup by ultrasound in 3 years.    Doing well  Plan home today on Augmentin, home oxygen.  Discussed with Dr. Luan Pulling as well as Gershon Mussel at the cancer center. We do not feel that bronchoscopy is indicated at this point forward change management. Tom reports recent CT scans looked fairly good and he would be surprised if this is recurrent malignancy. Will continue antibiotics,, will arrange follow-up with the [facility] in the next 2 weeks.   Murray Hodgkins, MD  [facility]  Pager [phone] If 7PM-7AM, please contact night-coverage at [email or web address], password [facility] 11/21/2014, 3:46 PM  LOS: 6 days   Consultants:  Pulmonology   Procedures:    Antibiotics:  Vancomycin 11/15/14>> 5/28  Zosyn 11/15/14>> 5/28  Augmentin 5/28 >> 6/4  HPI/Subjective: Feels  better, breathing better, eating well, no complaints. Ready to go home.   Objective: Filed Vitals:   11/21/14 0901 11/21/14 0905 11/21/14 1453 11/21/14 1526  BP: 133/77 133/77  156/93  Pulse:    103  Temp:    99 F (37.2 C)  TempSrc:    Oral  Resp:    18  Height:      Weight:      SpO2:   93% 97%    Intake/Output Summary (Last 24 hours) at 11/21/14 1546 Last data filed at 11/21/14 1343  Gross per 24 hour  Intake    480 ml  Output    300 ml  Net    180 ml     Filed Weights   11/15/14 2032 11/15/14 2358  Weight: 80.74 kg (178 lb) 80.967 kg (178 lb 8 oz)    Exam:     Afebrile, vital signs stable. They 7% 3 L. Oxygenation stable. General: Appears calm and comfortable Cardiovascular: RRR, no m/r/g.  Respiratory: CTA bilaterally, no w/r/r. Normal respiratory effort. Psychiatric: grossly normal mood and affect, speech fluent and appropriate Neurologic: grossly non-focal.  New data reviewed:    Pertinent data since admission:  Blood cultures no growth, final  CT chest with progressive airspace consolidation left lower lobe. Aortic atherosclerosis. Increased AP diameter infrarenal abdominal aortic aneurysm.  Scheduled Meds: . allopurinol  300 mg Oral Daily  . amLODipine  5 mg Oral Daily  . aspirin EC  81 mg Oral Daily  . enoxaparin (LOVENOX) injection  30 mg Subcutaneous Q24H  . ferrous sulfate  325 mg Oral BID PC  . folic acid  1 mg Oral Daily  . hydrALAZINE  25 mg Oral TID  . ipratropium-albuterol  3 mL  Nebulization TID  . levothyroxine  137 mcg Oral QAC breakfast  . pantoprazole  40 mg Oral Daily  . PARoxetine  30 mg Oral Daily  . piperacillin-tazobactam (ZOSYN)  IV  3.375 g Intravenous Q8H  . potassium chloride  10 mEq Oral Daily  . pravastatin  20 mg Oral Daily  . predniSONE  5 mg Oral BID WC  . vancomycin  1,000 mg Intravenous Q24H   Continuous Infusions: . sodium chloride 10 mL/hr at 11/19/14 4098    Principal Problem:   Acute respiratory failure  with hypoxia Active Problems:   Chronic renal insufficiency   Generalized anxiety disorder   Coronary artery disease   Essential hypertension   Anemia, iron deficiency   Squamous cell carcinoma of left lung   AKI (acute kidney injury)   COPD, moderate   HCAP (healthcare-associated pneumonia)   Hypoxia   Pleural effusion   Hyperglycemia

## 2014-11-21 NOTE — Progress Notes (Signed)
Pt's IV catheter removed and intact. Pt's IV site clean dry and intact. Discharge instructions reviewed and discussed with patient's daughter. Medications and follow up appointments reviewed and discussed with patient's daughter. All questions were answered and no further questions at this time. [facility]. Pt in stable condition and no acute distress at time of discharge. Pt escorted by nurse tech.

## 2014-11-22 ENCOUNTER — Other Ambulatory Visit (HOSPITAL_COMMUNITY): Payer: Self-pay | Admitting: Oncology

## 2014-11-24 ENCOUNTER — Other Ambulatory Visit (HOSPITAL_COMMUNITY): Payer: Self-pay | Admitting: Oncology

## 2014-11-24 DIAGNOSIS — R195 Other fecal abnormalities: Secondary | ICD-10-CM

## 2014-11-24 MED ORDER — DIPHENOXYLATE-ATROPINE 2.5-0.025 MG PO TABS: 1.0000 | ORAL_TABLET | Freq: Four times a day (QID) | ORAL | Status: DC | PRN

## 2014-11-30 ENCOUNTER — Encounter (HOSPITAL_COMMUNITY): Payer: Self-pay | Admitting: Hematology & Oncology

## 2014-11-30 ENCOUNTER — Encounter (HOSPITAL_COMMUNITY): Payer: Medicare Other | Attending: Hematology and Oncology | Admitting: Hematology & Oncology

## 2014-11-30 ENCOUNTER — Ambulatory Visit (HOSPITAL_COMMUNITY): Payer: Medicare Other | Admitting: Hematology & Oncology

## 2014-11-30 ENCOUNTER — Other Ambulatory Visit: Payer: Self-pay | Admitting: Family Medicine

## 2014-11-30 ENCOUNTER — Encounter (HOSPITAL_COMMUNITY): Payer: Medicare Other | Attending: Hematology & Oncology

## 2014-11-30 VITALS — BP 146/67 | HR 83 | Temp 98.9°F | Resp 18 | Wt 187.0 lb

## 2014-11-30 DIAGNOSIS — C3432 Malignant neoplasm of lower lobe, left bronchus or lung: Secondary | ICD-10-CM | POA: Diagnosis not present

## 2014-11-30 DIAGNOSIS — Z452 Encounter for adjustment and management of vascular access device: Secondary | ICD-10-CM | POA: Diagnosis present

## 2014-11-30 DIAGNOSIS — C3400 Malignant neoplasm of unspecified main bronchus: Secondary | ICD-10-CM

## 2014-11-30 DIAGNOSIS — C3492 Malignant neoplasm of unspecified part of left bronchus or lung: Secondary | ICD-10-CM | POA: Insufficient documentation

## 2014-11-30 MED ORDER — HEPARIN SOD (PORK) LOCK FLUSH 100 UNIT/ML IV SOLN
500.0000 [IU] | Freq: Once | INTRAVENOUS | Status: AC
  Administered 2014-11-30: 500 [IU] via INTRAVENOUS
  Filled 2014-11-30: qty 5

## 2014-11-30 MED ORDER — SODIUM CHLORIDE 0.9 % IJ SOLN
10.0000 mL | INTRAMUSCULAR | Status: DC | PRN
Start: 2014-11-30 — End: 2014-12-03
  Administered 2014-11-30: 10 mL via INTRAVENOUS
  Filled 2014-11-30: qty 10

## 2014-11-30 NOTE — Progress Notes (Signed)
[name] presented for Portacath access and flush. Proper placement of portacath confirmed by CXR. Portacath located right chest wall accessed with  H 20 needle. Good blood return present. Portacath flushed with 20ml NS and 500U/5ml Heparin and needle removed intact. Procedure without incident. Patient tolerated procedure well.   

## 2014-11-30 NOTE — Progress Notes (Signed)
Please see doctors encounter for more information 

## 2014-11-30 NOTE — Progress Notes (Signed)
Leah Hillier, MD [address] 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   She is here today with her daughter. She has recently been hospitalized for pneumonia. Per reports CT imaging showed progressive consolidation of the LLL at the site of her previous malignancy.  CT in March showed a stable appearance of the chest without definite signs of residual or recurrent disease.  Per discussion with Tom, our PA the patient has not done "well" since completion of therapy. She notes depression. She feels like she can't do much, restricted by her breathing and being tired.  She feels worthless sometimes.   She doesn't have any interests or hobbies.Her daughter said they try to get her up but she never wants to do anything.  She has not been through the [employer] or any rehabilitation. Transportation may be an issue, she no longer drives and her children live out of town. Her daughter is willing to help get her to rehabilitation.    Squamous cell carcinoma of left lung   06/30/2013 Imaging CT of chest demonstrates 4 cm left lower lobe lesion with other concerning findings for malignancy   07/09/2013 Imaging PET- 4.1 cm left lower oobe lesion with left hilar and bilateral mediastinal lymphadeopathy.  Hepatic lesion not confidently identified.  1.8 cm nodule in left upper lobe, cannot exclude low grade tumor.   07/16/2013 Initial Diagnosis Squamous cell carcinoma of left lung via CT-guided biopsy by IR   07/29/2013 Surgery IR placement  of port-a-cath in preparation for concomittant chemoradiation    08/08/2013 - 09/12/2013 Chemotherapy Paclitaxel/Carboplatin weekly x 6   08/08/2013 - 09/19/2013 Radiation Therapy    10/14/2013 Imaging CT CAP- 3.2 cm posterior left lower lobe cavitary mass, corresponding to known primary bronchogenic neoplasm, improved. Small mediastinal nodes measuring up to 8 mm. Two hypodense hepatic lesions measuring up to 12 mm, poorly evaluated.   10/24/2013 Imaging MRI abd- Two liver lesions in the superior right hepatic lobe and left hepatic lobe remain indeterminate technically, but favor small benign hemangiomas over metastatic lesions.   02/02/2014 Imaging CT CAP- Decreasing size of the left lower lobe mass since prior study. 4 mm right lower lobe subpleural nodule, not seen on prior study.  Slight enlargement of hypodensities within the liver concerning for metastases.   05/26/2014 Imaging CT CAP- Continued evolution of postradiation treatment related changes in the left lung without definite signs of residual or recurrent disease, and no definite evidence of metastatic disease in the chest, abdomen or pelvis on today's examination   07/16/2014 - 07/17/2014 Hospital Admission Acute on chronic renal failure with orthostatic hypotension.  Negative CT of head.     07/16/2014 Imaging CT head wo contrast- No evidence of intracranial metastatic disease noted on the present unenhanced head CT.   08/27/2014 Remission CT CAP- Stable appearance of the chest without definite signs of residual or recurrent disease and no evidence for metastatic disease in the chest, abdomen or pelvis.    Past Medical History  Diagnosis Date  . Hypertension   . Hypothyroidism   . Hyperlipidemia   .  Insomnia   . GERD (gastroesophageal reflux disease)     chronic gastritis  . Adrenal adenoma   . Chronic diarrhea   . Fatty liver   . Arthritis     knee  . QT prolongation     syncope  . Coronary artery disease   . Impaired fasting glucose     . Renal insufficiency     low protien diet  . Tobacco use     1/2 ppd, approx 25-50 pack years (as of 08/2012)  . Family history of heart disease   . IBS (irritable bowel syndrome)   . Peripheral arterial disease     sstatus post  infrarenal abdominal aortic tube graft placed by Dr. Victorino Dike February 2008  . Cancer     Lung  . Squamous cell carcinoma of left lung 07/29/2013  . HCAP (healthcare-associated pneumonia)     10/2014  . Acute respiratory failure     10/2014  . COPD (chronic obstructive pulmonary disease)     moderat  . Pleural effusion     has Unspecified hypothyroidism; Pruritic dermatitis; Chronic renal insufficiency; Generalized anxiety disorder; Insomnia; Esophageal reflux; Coronary artery disease; Essential hypertension; Hyperlipidemia; Peripheral arterial disease; Anemia, iron deficiency; Squamous cell carcinoma of left lung; AKI (acute kidney injury); COPD, moderate; Cholelithiasis; Near syncope; Syncope; Orthostatic hypotension; Loose stools; Failure to thrive in adult; Pneumonia; HCAP (healthcare-associated pneumonia); Hypoxia; Healthcare-associated pneumonia; SOB (shortness of breath); Acute respiratory failure with hypoxia; Pleural effusion; and Hyperglycemia on her problem list.     is allergic to aleve; dilaudid; dyazide; and zithromax.  We administered heparin lock flush and sodium chloride.  Past Surgical History  Procedure Laterality Date  . Abdominal hysterectomy    . Thyroidectomy, partial    . Colonoscopy  5/04    normal  . Abdominal aortic aneurysm repair  07/2006    D. J.D. Kellie Simmering  . Cataract extraction Bilateral 2010  . Cardiac catheterization  02/2010    mod CAD in L-dominant system with normal LV function  . Colon resection  2005    1/2 colon removed  . Transthoracic echocardiogram  02/2010    EF 55-60%; mild conc LVH, normal systolic function; mildly calcified AV annulus  . Colonoscopy with esophagogastroduodenoscopy (egd) N/A 03/19/2013     Procedure: COLONOSCOPY WITH ESOPHAGOGASTRODUODENOSCOPY (EGD);  Surgeon: Rogene Houston, MD;  Location: [facility];  Service: Endoscopy;  Laterality: N/A;  145  . Dg biopsy lung Left Jan 2015    Positive for malaise/fatigue and shortness of breath. Positive for depression. Denies any headaches, double vision, fevers, chills, night sweats, nausea, vomiting, diarrhea, constipation, chest pain, heart palpitations, shortness of breath, blood in stool, black tarry stool, urinary pain, urinary burning, urinary frequency, hematuria. She has an oxygen tank.   PHYSICAL EXAMINATION  ECOG PERFORMANCE STATUS: 2 - Symptomatic, <50% confined to bed  Filed Vitals:   11/30/14 1153  BP: 146/67  Pulse: 83  Temp: 98.9 F (37.2 C)  Resp: 18    GENERAL:alert, no distress, well nourished, well developed, comfortable, cooperative  SKIN: skin color, texture, turgor are normal, no rashes or significant lesions HEAD: Normocephalic, No masses, lesions, tenderness or abnormalities EYES: normal, PERRLA, EOMI, Conjunctiva are pink and non-injected EARS: External ears normal OROPHARYNX: lips, buccal mucosa, and tongue normal and mucous membranes are moist  NECK: supple, trachea midline LYMPH:  not examined BREAST:not examined LUNGS: Decreased breath sounds in the left lower lung. Otherwise clear HEART: S1/S2 audible, regular ABDOMEN:abdomen soft and normal  bowel sounds BACK: Back symmetric, no curvature. EXTREMITIES:less then 2 second capillary refill, no joint deformities, effusion, or inflammation, no cyanosis  NEURO: alert & oriented x 3 with fluent speech, no focal motor/sensory deficits, in a wheelchair, mini-mental status exam is negative.   LABORATORY DATA: CBC    Component Value Date/Time   WBC 5.9 11/20/2014 0637   RBC 3.05* 11/20/2014 0637   HGB 10.1* 11/20/2014 0637   HCT 31.9* 11/20/2014 0637   PLT 279 11/20/2014 0637   MCV 104.6* 11/20/2014 0637   MCH 33.1 11/20/2014 0637   MCHC 31.7  11/20/2014 0637   RDW 15.0 11/20/2014 0637   LYMPHSABS 0.9 11/15/2014 2105   MONOABS 0.8 11/15/2014 2105   EOSABS 0.2 11/15/2014 2105   BASOSABS 0.0 11/15/2014 2105      Chemistry      Component Value Date/Time   NA 141 11/20/2014 0637   K 4.0 11/20/2014 0637   CL 104 11/20/2014 0637   CO2 28 11/20/2014 0637   BUN 11 11/20/2014 0637   CREATININE 1.82* 11/20/2014 0637   CREATININE 2.07* 06/24/2014 1239      Component Value Date/Time   CALCIUM 8.9 11/20/2014 0637   ALKPHOS 39 10/20/2014 1530   AST 23 10/20/2014 1530   ALT 16 10/20/2014 1530   BILITOT 0.8 10/20/2014 1530      RADIOGRAPHIC STUDIES:  CLINICAL DATA: Hypoxia for 2 months. Right side breast cancer.  EXAM: CT CHEST WITHOUT CONTRAST  TECHNIQUE: Multidetector CT imaging of the chest was performed following the standard protocol without IV contrast.  COMPARISON: 08/27/2014  FINDINGS: Mediastinum: There is moderate cardiac enlargement. Calcified atherosclerotic disease involves the thoracic aorta as well as the LAD, left circumflex coronary arteries. The trachea appears patent and is midline. Normal appearance of the esophagus. No mediastinal or hilar adenopathy identified.  Lungs/Pleura: There is a left pleural effusion which appears new from previous exam. Next  Moderate to advanced changes of centrilobular and paraseptal emphysema identified.  Changes of external beam radiation are identified within both lungs in a paramediastinal distribution. There is progressive consolidation involving the almost all of the left lower lobe, image 44/series 2 and image number 77 of series 5 this obscures the previously described nonspecific nodular opacity in the posterior left lung. The previously identified subpleural nodule in the posterior right lung base is not seen on today's study.  Upper Abdomen: Normal appearance of the right adrenal gland. Left adrenal gland adenoma is again noted and  appears unchanged from previous study, image 60/series 2. Within the left lobe of liver there is a 1.9 cm low-attenuation structure, image 55/series 2. There is a 1.1 cm stone within the gallbladder. This is unchanged from previous exam. Aortic atherosclerosis noted. The infrarenal abdominal aorta measures up to 3 cm.  Musculoskeletal: Review of the visualized osseous structures is negative for aggressive lytic or sclerotic bone lesion.  IMPRESSION: 1. There is progressive airspace consolidation involving the entire left lower lobe. This is in an area of biopsy proven squamous cell carcinoma. Although these findings may reflect consolidation either due to progressive changes of external beam radiation or pneumonia recurrent tumor cannot be excluded. Consider further evaluation with bronchoscopy and tissue sampling. 2. New left pleural effusion. 3. Stable low-attenuation structure in left lobe of liver. 4. Aortic atherosclerosis including coronary artery calcification 5. Increased AP diameter of the infrarenal abdominal aorta which measures 3 cm. Recommend followup by ultrasound in 3 years. This recommendation follows ACR consensus guidelines: White Paper of the  ACR Incidental Findings Committee II on Vascular Findings. J Am Coll Radiol 2013; 18:343-735 6. Emphysema.   Electronically Signed  By: Kerby Moors M.D.  On: 11/19/2014 12:56     ASSESSMENT AND PLAN:  Locally Advanced Squamous Cell Carcinoma of [facility] admission secondary to CAP Depression  I have strongly recommended a referral to the [place] program. Her daughter feels that they can work out transportation.  She will need repeat CT scan of the chest in 4 to 6 weeks, pending the results we may need to proceed with PET imaging and/or repeat biopsy. I discussed this with the patient and her daughter in detail.  I advised the patient we can discuss having her port taken out at the next appointment.  She has follow-up with Dr. Wolfgang Phoenix and we discussed a change in her anti-depressant. We also discussed referring her to our Education officer, museum and resources in the local community.  All questions were answered. The patient knows to call the clinic with any problems, questions or concerns. We can certainly see the patient much sooner if necessary.  This document serves as a record of services personally performed by Ancil Linsey, MD. It was created on her behalf by Arlyce Harman, a trained medical scribe. The creation of this record is based on the scribe's personal observations and the provider's statements to them. This document has been checked and approved by the attending provider.  I have reviewed the above documentation for accuracy and completeness, and I agree with the above. This note was electronically signed. Molli Hazard MD 12/03/2014

## 2014-11-30 NOTE — Patient Instructions (Addendum)
[place] at [facility] Discharge Instructions  RECOMMENDATIONS MADE BY THE CONSULTANT AND ANY TEST RESULTS WILL BE SENT TO YOUR REFERRING PHYSICIAN.  Exam and discussion by Dr. Whitney Muse. Will make a referral to the [place]. Will get CT scan of your chest to see how you are doing. [place] in 6 weeks. Office visit in 6 weeks.  Thank you for choosing [place] at [facility] to provide your oncology and hematology care.  To afford each patient quality time with our provider, please arrive at least 15 minutes before your scheduled appointment time.    You need to re-schedule your appointment should you arrive 10 or more minutes late.  We strive to give you quality time with our providers, and arriving late affects you and other patients whose appointments are after yours.  Also, if you no show three or more times for appointments you may be dismissed from the clinic at the providers discretion.     Again, thank you for choosing [facility].  Our hope is that these requests will decrease the amount of time that you wait before being seen by our physicians.       _____________________________________________________________  Should you have questions after your visit to [facility], please contact our office at [phone] between the hours of 8:30 a.m. and 4:30 p.m.  Voicemails left after 4:30 p.m. will not be returned until the following business day.  For prescription refill requests, have your pharmacy contact our office.

## 2014-12-02 ENCOUNTER — Ambulatory Visit (INDEPENDENT_AMBULATORY_CARE_PROVIDER_SITE_OTHER): Payer: Medicare Other | Admitting: Family Medicine

## 2014-12-02 VITALS — BP 140/82 | Ht 67.0 in | Wt 186.2 lb

## 2014-12-02 DIAGNOSIS — J449 Chronic obstructive pulmonary disease, unspecified: Secondary | ICD-10-CM

## 2014-12-02 DIAGNOSIS — I10 Essential (primary) hypertension: Secondary | ICD-10-CM | POA: Diagnosis not present

## 2014-12-02 DIAGNOSIS — F0391 Unspecified dementia with behavioral disturbance: Secondary | ICD-10-CM | POA: Diagnosis not present

## 2014-12-02 DIAGNOSIS — F03918 Unspecified dementia, unspecified severity, with other behavioral disturbance: Secondary | ICD-10-CM

## 2014-12-02 MED ORDER — ONDANSETRON 4 MG PO TBDP: 4.0000 mg | ORAL_TABLET | Freq: Three times a day (TID) | ORAL | Status: DC | PRN

## 2014-12-02 MED ORDER — TRAMADOL HCL 50 MG PO TABS: 50.0000 mg | ORAL_TABLET | Freq: Four times a day (QID) | ORAL | Status: DC | PRN

## 2014-12-02 MED ORDER — DULOXETINE HCL 60 MG PO CPEP: 60.0000 mg | ORAL_CAPSULE | Freq: Every day | ORAL | Status: DC

## 2014-12-02 NOTE — Progress Notes (Signed)
   Subjective:    Patient ID: [name], female    DOB: 1935/05/22, 80 y.o.   MRN: [id number]  HPI Patient arrives for a follow up on a recent hospitalization for hypoxic respiratory failure.  Patient was in the hospital for pneumonia. Developed hypoxic failure. Was advised to maintain oxygen for now. Next  Patient reports her breathing has improved somewhat.  Family frustrated by the diagnosis of dementia via the patient's neurologist. They're not really sure of the Aricept is helping.   Patient's energy level is low.  Patient reports her anxiety has somewhat worsened also. Also progressive difficulties with depression. This is spelled out in most recent visit with hematologist.   Patient left hospital on oxygen 3 liters with hopes of being able to taper off oxygen.  [id number] Review of Systems    some headache no chest pain mild back pain no change in bowel habits no blood in stool Objective:   Physical Exam Alert vitals stable oxygen present HEENT normal. Lungs no crackles or wheezes currently no tachypnea heart regular in rhythm.       Assessment & Plan:  Impression 1 status post respiratory failure #2 chronic oxygen at least for now. #3 depression worsening discuss #4 dementia diagnosis concerns on part of family discussed at great length plan switch to Cymbalta. Rationale discussed. Maintain oxygen for now. Follow-up as scheduled. Referral to new neurologist per family request. WSL

## 2014-12-03 ENCOUNTER — Encounter (HOSPITAL_COMMUNITY): Payer: Self-pay | Admitting: Hematology & Oncology

## 2014-12-08 ENCOUNTER — Other Ambulatory Visit: Payer: Self-pay | Admitting: Family Medicine

## 2014-12-08 DIAGNOSIS — F0391 Unspecified dementia with behavioral disturbance: Secondary | ICD-10-CM | POA: Insufficient documentation

## 2014-12-08 DIAGNOSIS — F03918 Unspecified dementia, unspecified severity, with other behavioral disturbance: Secondary | ICD-10-CM | POA: Insufficient documentation

## 2014-12-11 ENCOUNTER — Telehealth: Payer: Self-pay | Admitting: Family Medicine

## 2014-12-11 DIAGNOSIS — K529 Noninfective gastroenteritis and colitis, unspecified: Secondary | ICD-10-CM

## 2014-12-11 NOTE — Telephone Encounter (Signed)
Notified [name] (patient's sister) that is the strongest med we have for chronic diarrhea, lets do some stool tests c diff, bacterial stool culture, ova and parasite. [name] verbalized understanding. Bottles and lab work ready for pickup.

## 2014-12-11 NOTE — Telephone Encounter (Signed)
ntsw that is the strongest med we have for chronic diarrhea, lets do some stool tests c diff bact cx o and p

## 2014-12-11 NOTE — Telephone Encounter (Signed)
Pt's sister states the lomodil isn't working and wants to know if there is something else that she can take.

## 2014-12-12 ENCOUNTER — Other Ambulatory Visit (HOSPITAL_COMMUNITY): Payer: Self-pay | Admitting: Oncology

## 2014-12-14 ENCOUNTER — Other Ambulatory Visit (HOSPITAL_COMMUNITY): Payer: Self-pay | Admitting: Oncology

## 2014-12-14 DIAGNOSIS — R195 Other fecal abnormalities: Secondary | ICD-10-CM

## 2014-12-14 DIAGNOSIS — Z529 Donor of unspecified organ or tissue: Secondary | ICD-10-CM | POA: Diagnosis not present

## 2014-12-14 DIAGNOSIS — K529 Noninfective gastroenteritis and colitis, unspecified: Secondary | ICD-10-CM | POA: Diagnosis not present

## 2014-12-14 MED ORDER — DIPHENOXYLATE-ATROPINE 2.5-0.025 MG PO TABS: 1.0000 | ORAL_TABLET | Freq: Four times a day (QID) | ORAL | Status: DC | PRN

## 2014-12-24 ENCOUNTER — Encounter: Payer: Self-pay | Admitting: Family Medicine

## 2014-12-31 ENCOUNTER — Ambulatory Visit (HOSPITAL_COMMUNITY): Payer: Medicare Other | Attending: Hematology & Oncology | Admitting: Physical Therapy

## 2014-12-31 DIAGNOSIS — R2689 Other abnormalities of gait and mobility: Secondary | ICD-10-CM | POA: Diagnosis not present

## 2014-12-31 DIAGNOSIS — R262 Difficulty in walking, not elsewhere classified: Secondary | ICD-10-CM | POA: Diagnosis not present

## 2014-12-31 DIAGNOSIS — R530 Neoplastic (malignant) related fatigue: Secondary | ICD-10-CM | POA: Insufficient documentation

## 2014-12-31 DIAGNOSIS — R29898 Other symptoms and signs involving the musculoskeletal system: Secondary | ICD-10-CM | POA: Diagnosis not present

## 2014-12-31 NOTE — Patient Instructions (Signed)
Straight Leg Raise   Tighten stomach and slowly raise locked right leg 15____ inches from floor. Repeat __10__ times per set. Do _1___ sets per session. Do __2__ sessions per day.  http://orth.exer.us/1102   Copyright  VHI. All rights reserved.  Bridging   Slowly raise buttocks from floor, keeping stomach tight. Repeat 10__ times per set. Do __1__ sets per session. Do __2__ sessions per day.  http://orth.exer.us/1096   Copyright  VHI. All rights reserved.  Strengthening: Hip Abduction (Side-Lying)   Tighten muscles on front of left thigh, then lift leg _15___ inches from surface, keeping knee locked.  Repeat _10___ times per set. Do _1___ sets per session. Do _2___ sessions per day.  http://orth.exer.us/622   Copyright  VHI. All rights reserved.  Functional Quadriceps: Chair Squat   Keeping feet flat on floor, shoulder width apart, squat as low as is comfortable. Use support as necessary. Repeat __10__ times per set. Do _1___ sets per session. Do _2___ sessions per day.  http://orth.exer.us/736   Copyright  VHI. All rights reserved.

## 2014-12-31 NOTE — Addendum Note (Signed)
Addended by: Leeroy Cha on: 12/31/2014 05:14 PM   Modules accepted: Orders

## 2014-12-31 NOTE — Therapy (Signed)
[facility] [address] [place], [place], [address] Phone: [phone]   Fax:  [phone]  Physical Therapy Evaluation  Patient Details  Name: [name] MRN: [id number] Date of Birth: October 18, 1934 Referring Provider:  Patrici Ranks, MD  Encounter Date: 12/31/2014      PT End of Session - 12/31/14 1657    Visit Number 1   Number of Visits 12   Date for PT Re-Evaluation 01/30/15   Authorization Type medicare   PT Start Time 1110   PT Stop Time 1200   PT Time Calculation (min) 50 min   Activity Tolerance Patient tolerated treatment well      Past Medical History  Diagnosis Date  . Hypertension   . Hypothyroidism   . Hyperlipidemia   . Insomnia   . GERD (gastroesophageal reflux disease)     chronic gastritis  . Adrenal adenoma   . Chronic diarrhea   . Fatty liver   . Arthritis     knee  . QT prolongation     syncope  . Coronary artery disease   . Impaired fasting glucose   . Renal insufficiency     low protien diet  . Tobacco use     1/2 ppd, approx 25-50 pack years (as of 08/2012)  . Family history of heart disease   . IBS (irritable bowel syndrome)   . Peripheral arterial disease     sstatus post  infrarenal abdominal aortic tube graft placed by Dr. Victorino Dike February 2008  . Cancer     Lung  . Squamous cell carcinoma of left lung 07/29/2013  . HCAP (healthcare-associated pneumonia)     10/2014  . Acute respiratory failure     10/2014  . COPD (chronic obstructive pulmonary disease)     moderat  . Pleural effusion     Past Surgical History  Procedure Laterality Date  . Abdominal hysterectomy    . Thyroidectomy, partial    . Colonoscopy  5/04    normal  . Abdominal aortic aneurysm repair  07/2006    D. J.D. Kellie Simmering  . Cataract extraction Bilateral 2010  . Cardiac catheterization  02/2010    mod CAD in L-dominant system with normal LV function  . Colon resection  2005    1/2 colon removed  .  Transthoracic echocardiogram  02/2010    EF 55-60%; mild conc LVH, normal systolic function; mildly calcified AV annulus  . Colonoscopy with esophagogastroduodenoscopy (egd) N/A 03/19/2013    Procedure: COLONOSCOPY WITH ESOPHAGOGASTRODUODENOSCOPY (EGD);  Surgeon: Rogene Houston, MD;  [facility];  Service: Endoscopy;  Laterality: N/A;  145  . Dg biopsy lung Left Jan 2015    There were no vitals filed for this visit.  Visit Diagnosis:  Neoplastic malignant related fatigue  Leg weakness, bilateral  Difficulty walking  Unstable balance      Subjective Assessment - 12/31/14 1120    Subjective [name] states that she has finished her chemo and radiation therapy.  She states that she has been on oxygen for a month now.  She states she has no energy and she is stumbling when she walks.    Pertinent History Stage III B locally advanced squamous cell carcinoma of the left lower lobe, measuring 4.1 cm involving the left hilar and bilateral mediastinal lymph nodes with questionable hepatic lesions, status post combined modality therapy utilizing carboplatin/Taxol/radiotherapy with treatment ending on 09/12/2013. NED at this present time. CT head recently is  negative for any intracranial metastatic disease and no oncologic cause of falling. Restaging scans as planned in March 2016 with follow-up appointment. Port-flush and labs in March 2016: CBC diff, CMET   How long can you sit comfortably? no problem.   How long can you stand comfortably? 5-10 minutes    How long can you walk comfortably? 5-10 minutes    Patient Stated Goals more energy, walk better, To be able to drive again.     Currently in Pain? No/denies            [facility] PT Assessment - 12/31/14 0001    Assessment   Medical Diagnosis fatigue    Prior Therapy none   Precautions   Precautions Fall   Restrictions   Weight Bearing Restrictions No   Balance Screen   Has the patient fallen in the past 6 months Yes    How many times? 3   Has the patient had a decrease in activity level because of a fear of falling?  Yes   Is the patient reluctant to leave their home because of a fear of falling?  No   Home Environment   Living Environment Private residence   Type of [place] One level   Prior Function   Level of Independence Independent with gait   Vocation Retired   Leisure church; visiting children    Cognition   Overall Cognitive Status Within Functional Limits for tasks assessed   Observation/Other Assessments   Other Surveys  --  TUG:  20; VAS Fatigue 9.3 pain 4.3, distress 6.3; FACIT-49.5   Functional Tests   Functional tests Single leg stance;Sit to Stand   Single Leg Stance   Comments Lt 2 seconds ; Rt 6 seconds    Sit to Stand   Comments 4 in 30 seconds    Strength   Strength Assessment Site Hip;Knee;Ankle   Right/Left Hip Right;Left   Right Hip Flexion 3-/5   Right Hip Extension 2/5   Right Hip ABduction 3+/5   Left Hip Flexion 3-/5   Left Hip Extension 2/5   Left Hip ABduction 4+/5   Right/Left Knee Right;Left   Right Knee Extension 5/5   Left Knee Extension 5/5   Right/Left Ankle Right;Left   Right Ankle Dorsiflexion 5/5   Left Ankle Dorsiflexion 5/5                   [facility] Adult PT Treatment/Exercise - 12/31/14 0001    Exercises   Exercises Knee/Hip   Knee/Hip Exercises: Standing   SLS x3    Knee/Hip Exercises: Seated   Long Arc Quad 10 reps   Knee/Hip Exercises: Supine   Bridges 5 reps   Straight Leg Raises 5 reps   Knee/Hip Exercises: Sidelying   Hip ABduction 5 reps                PT Education - 12/31/14 1656    Education provided Yes   Education Details HEP for strengthening ; given star booklet spoke to pt on the importance of increaseng steps per day   Person(s) Educated Patient;Child(ren)   Methods Explanation;Handout   Comprehension Verbalized understanding;Returned demonstration          PT Short Term  Goals - 12/31/14 1704    PT SHORT TERM GOAL #1   Title Pt to be I in HEP   Time 3   Period Weeks   PT SHORT TERM GOAL #2   Title Pt  to be able to stand for 15 minutes to complete grooming tasks in the morning   Time 3   Period Weeks   PT SHORT TERM GOAL #3   Title Pt to be strength of LE to be increased one grade to allow sit to stand on one attempt    Time 3   Period Weeks   PT SHORT TERM GOAL #4   Title Pt to be and to ambulate x 15 minutes without fatigue   Time 3   Period Weeks           PT Long Term Goals - 20-Jan-2015 1706    PT LONG TERM GOAL #1   Title Pt strength to be increased to 4+/5 to allow reciprocal stair climbing   Time 6   Period Weeks   PT LONG TERM GOAL #2   Title Pt to be able to stand for 20 minutes to make a small meal    Time 4   Period Weeks   PT LONG TERM GOAL #3   Title Pt to be walking for 20-30 minutes a day for better health habitis.    Time 6   Period Weeks   PT LONG TERM GOAL #4   Title Pt TUG to be decreased to 15 seconds or less to reduce risk of falling    Time 6   Period Weeks               Plan - 2015/01/20 1658    Clinical Impression Statement [name] is a 80 yo female [facility] to [facility].  She has a dx of lung cancer she has completed her chemo and has significant fatigue and decreased stability when she is on her feet.  Examination demonstrates decreased balance, decreased strength and decreased activity tolerance.  She will benefit from skilled PT to address these issues and maximize her independence.    Pt will benefit from skilled therapeutic intervention in order to improve on the following deficits Abnormal gait;Decreased activity tolerance;Decreased balance;Decreased range of motion;Decreased strength;Difficulty walking   Rehab Potential Good   PT Frequency 2x / week   PT Duration 6 weeks   PT Treatment/Interventions Gait training;Stair training;Functional mobility training;Therapeutic  activities;Therapeutic exercise;Balance training;Patient/family education   PT Next Visit Plan Begin weight bearing strengthening  but focus on balance.  Gt train with cane to see if pt is safe with cane vs walker.    PT Home Exercise Plan given   Consulted and Agree with Plan of Care Patient;Family member/caregiver          G-Codes - 01-20-15 1709    Functional Assessment Tool Used FACIT   Functional Limitation Self care   Self Care Current Status [phone]) At least 60 percent but less than 80 percent impaired, limited or restricted   Self Care Goal Status (W2993) At least 40 percent but less than 60 percent impaired, limited or restricted       Problem List Patient Active Problem List   Diagnosis Date Noted  . Dementia with behavioral disturbance 12/08/2014  . Hyperglycemia 11/20/2014  . Pleural effusion 11/19/2014  . Acute respiratory failure with hypoxia 11/16/2014  . Pneumonia 11/15/2014  . HCAP (healthcare-associated pneumonia) 11/15/2014  . Hypoxia 11/15/2014  . Healthcare-associated pneumonia   . SOB (shortness of breath)   . Orthostatic hypotension 07/20/2014  . Loose stools 07/20/2014  . Failure to thrive in adult 07/20/2014  . Syncope 07/17/2014  . Near syncope 07/16/2014  . Cholelithiasis 11/07/2013  .  AKI (acute kidney injury) 09/22/2013  . COPD, moderate 09/22/2013  . Squamous cell carcinoma of left lung 07/29/2013  . Anemia, iron deficiency 03/03/2013  . Coronary artery disease 02/28/2013  . Essential hypertension 02/28/2013  . Hyperlipidemia 02/28/2013  . Peripheral arterial disease 02/28/2013  . Generalized anxiety disorder 12/10/2012  . Insomnia 12/10/2012  . Esophageal reflux 12/10/2012  . Chronic renal insufficiency 11/07/2012  . Unspecified hypothyroidism 09/10/2012  . Pruritic dermatitis 09/10/2012   Rayetta Humphrey, PT CLT [phone] 12/31/2014, 5:10 PM  [facility] [address] [place],  [place], [address] Phone: [phone]   Fax:  [phone]

## 2015-01-02 ENCOUNTER — Other Ambulatory Visit (HOSPITAL_COMMUNITY): Payer: Self-pay | Admitting: Oncology

## 2015-01-04 ENCOUNTER — Encounter: Payer: Self-pay | Admitting: Family Medicine

## 2015-01-06 ENCOUNTER — Other Ambulatory Visit: Payer: Self-pay | Admitting: Family Medicine

## 2015-01-07 ENCOUNTER — Ambulatory Visit (HOSPITAL_COMMUNITY)
Admission: RE | Admit: 2015-01-07 | Discharge: 2015-01-07 | Disposition: A | Discharge status: Home / Self Care | Payer: Medicare Other | Source: Ambulatory Visit | Attending: Oncology | Admitting: Oncology

## 2015-01-07 DIAGNOSIS — J439 Emphysema, unspecified: Secondary | ICD-10-CM | POA: Insufficient documentation

## 2015-01-07 DIAGNOSIS — Z87891 Personal history of nicotine dependence: Secondary | ICD-10-CM | POA: Diagnosis not present

## 2015-01-07 DIAGNOSIS — C3492 Malignant neoplasm of unspecified part of left bronchus or lung: Secondary | ICD-10-CM | POA: Diagnosis not present

## 2015-01-07 DIAGNOSIS — C3490 Malignant neoplasm of unspecified part of unspecified bronchus or lung: Secondary | ICD-10-CM | POA: Diagnosis not present

## 2015-01-11 ENCOUNTER — Encounter (HOSPITAL_COMMUNITY): Payer: Medicare Other | Attending: Hematology and Oncology | Admitting: Hematology & Oncology

## 2015-01-11 ENCOUNTER — Encounter (HOSPITAL_COMMUNITY): Payer: Self-pay | Admitting: Hematology & Oncology

## 2015-01-11 ENCOUNTER — Encounter (HOSPITAL_COMMUNITY): Payer: Medicare Other | Attending: Hematology & Oncology

## 2015-01-11 VITALS — BP 155/71 | HR 86 | Temp 99.1°F | Resp 20 | Wt 179.7 lb

## 2015-01-11 DIAGNOSIS — F329 Major depressive disorder, single episode, unspecified: Secondary | ICD-10-CM | POA: Diagnosis not present

## 2015-01-11 DIAGNOSIS — C3492 Malignant neoplasm of unspecified part of left bronchus or lung: Secondary | ICD-10-CM | POA: Insufficient documentation

## 2015-01-11 DIAGNOSIS — C3490 Malignant neoplasm of unspecified part of unspecified bronchus or lung: Secondary | ICD-10-CM

## 2015-01-11 DIAGNOSIS — R599 Enlarged lymph nodes, unspecified: Secondary | ICD-10-CM | POA: Diagnosis not present

## 2015-01-11 DIAGNOSIS — C3432 Malignant neoplasm of lower lobe, left bronchus or lung: Secondary | ICD-10-CM | POA: Diagnosis not present

## 2015-01-11 MED ORDER — HEPARIN SOD (PORK) LOCK FLUSH 100 UNIT/ML IV SOLN
500.0000 [IU] | Freq: Once | INTRAVENOUS | Status: AC
  Administered 2015-01-11: 500 [IU] via INTRAVENOUS

## 2015-01-11 MED ORDER — HEPARIN SOD (PORK) LOCK FLUSH 100 UNIT/ML IV SOLN
INTRAVENOUS | Status: AC
  Filled 2015-01-11: qty 5

## 2015-01-11 MED ORDER — SODIUM CHLORIDE 0.9 % IJ SOLN
10.0000 mL | INTRAMUSCULAR | Status: DC | PRN
  Administered 2015-01-11: 10 mL via INTRAVENOUS
  Filled 2015-01-11: qty 10

## 2015-01-11 NOTE — Progress Notes (Signed)
Mickie Hillier, MD [address] [place] [address]  Stage III locally advanced squamous cell carcinoma of the LLL  CURRENT THERAPY: Surveillance   INTERVAL HISTORY: [name] 80 y.o. 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   She is here today with her daughter. She has recently been hospitalized for pneumonia. Per reports CT imaging showed progressive consolidation of the LLL at the site of her previous malignancy.  CT in March showed a stable appearance of the chest without definite signs of residual or recurrent disease.  She is here today for review of recent CT scans. She has been to the [employer] and feels it is going to help her physically. She has multiple appointments scheduled with them and has been able to go. Her limiting factor is a large oxygen tank that she has to push or pull, this not only limits her in physical therapy but also limits her ambulation outside of her medical care.     Squamous cell carcinoma of left lung   06/30/2013 Imaging CT of chest demonstrates 4 cm left lower lobe lesion with other concerning findings for malignancy   07/09/2013 Imaging PET- 4.1 cm left lower oobe lesion with left hilar and bilateral mediastinal lymphadeopathy.  Hepatic lesion not confidently identified.  1.8 cm nodule in left upper lobe, cannot exclude low grade tumor.   07/16/2013 Initial Diagnosis Squamous cell carcinoma of left lung via CT-guided biopsy by IR   07/29/2013 Surgery IR placement of port-a-cath in preparation for concomittant chemoradiation    08/08/2013 - 09/12/2013 Chemotherapy Paclitaxel/Carboplatin weekly x 6   08/08/2013 - 09/19/2013 Radiation Therapy    10/14/2013 Imaging CT CAP- 3.2 cm posterior left lower lobe cavitary mass, corresponding to known primary bronchogenic neoplasm, improved. Small mediastinal nodes measuring up to 8 mm. Two hypodense hepatic lesions measuring up to 12 mm, poorly evaluated.   10/24/2013 Imaging MRI abd- Two liver lesions in the superior right hepatic lobe and left hepatic lobe remain indeterminate technically, but favor small benign hemangiomas over metastatic lesions.   02/02/2014 Imaging CT CAP- Decreasing size of the left lower lobe mass since prior study. 4 mm right lower lobe subpleural nodule, not seen on prior study.  Slight enlargement of hypodensities within the liver concerning for metastases.   05/26/2014 Imaging CT CAP- Continued evolution of postradiation treatment related changes in the left lung without definite signs of residual or recurrent disease, and no definite evidence of metastatic disease in the chest, abdomen or pelvis on today's examination   07/16/2014 - 07/17/2014 Hospital Admission Acute on chronic renal failure with orthostatic hypotension.  Negative CT of head.     07/16/2014 Imaging CT head wo contrast- No evidence of intracranial metastatic disease noted on the present unenhanced head CT.   08/27/2014 Remission CT CAP- Stable appearance of the chest without definite signs of residual or recurrent disease and no evidence for metastatic disease in the chest, abdomen or pelvis.    Past Medical History  Diagnosis Date  . Hypertension   . Hypothyroidism   . Hyperlipidemia   . Insomnia   . GERD (gastroesophageal reflux disease)     chronic gastritis  . Adrenal adenoma   . Chronic diarrhea   . Fatty  liver   . Arthritis     knee  . QT prolongation     syncope  . Coronary artery disease   . Impaired fasting glucose   . Renal insufficiency     low protien diet  . Tobacco use     1/2 ppd, approx 25-50 pack years (as of 08/2012)  . Family history of heart disease   . IBS (irritable bowel  syndrome)   . Peripheral arterial disease     sstatus post  infrarenal abdominal aortic tube graft placed by Dr. Victorino Dike February 2008  . Cancer     Lung  . Squamous cell carcinoma of left lung 07/29/2013  . HCAP (healthcare-associated pneumonia)     10/2014  . Acute respiratory failure     10/2014  . COPD (chronic obstructive pulmonary disease)     moderat  . Pleural effusion     has Unspecified hypothyroidism; Pruritic dermatitis; Chronic renal insufficiency; Generalized anxiety disorder; Insomnia; Esophageal reflux; Coronary artery disease; Essential hypertension; Hyperlipidemia; Peripheral arterial disease; Anemia, iron deficiency; Squamous cell carcinoma of left lung; AKI (acute kidney injury); COPD, moderate; Cholelithiasis; Near syncope; Syncope; Orthostatic hypotension; Loose stools; Failure to thrive in adult; Pneumonia; HCAP (healthcare-associated pneumonia); Hypoxia; Healthcare-associated pneumonia; SOB (shortness of breath); Acute respiratory failure with hypoxia; Pleural effusion; Hyperglycemia; and Dementia with behavioral disturbance on her problem list.     is allergic to aleve; dilaudid; dyazide; and zithromax.  We administered heparin lock flush and sodium chloride.  Past Surgical History  Procedure Laterality Date  . Abdominal hysterectomy    . Thyroidectomy, partial    . Colonoscopy  5/04    normal  . Abdominal aortic aneurysm repair  07/2006    D. J.D. Kellie Simmering  . Cataract extraction Bilateral 2010  . Cardiac catheterization  02/2010    mod CAD in L-dominant system with normal LV function  . Colon resection  2005    1/2 colon removed  . Transthoracic echocardiogram  02/2010    EF 55-60%; mild conc LVH, normal systolic function; mildly calcified AV annulus  . Colonoscopy with esophagogastroduodenoscopy (egd) N/A 03/19/2013    Procedure: COLONOSCOPY WITH ESOPHAGOGASTRODUODENOSCOPY (EGD);  Surgeon: Rogene Houston, MD;  [facility];  Service: Endoscopy;   Laterality: N/A;  145  . Dg biopsy lung Left Jan 2015    Positive for malaise/fatigue and shortness of breath. Positive for depression. Denies any headaches, double vision, fevers, chills, night sweats, nausea, vomiting, diarrhea, constipation, chest pain, heart palpitations, shortness of breath, blood in stool, black tarry stool, urinary pain, urinary burning, urinary frequency, hematuria. She has an oxygen tank. 14 point review of systems was performed and is negative except as detailed under history of present illness and above    PHYSICAL EXAMINATION  ECOG PERFORMANCE STATUS: 2 - Symptomatic, <50% confined to bed  Filed Vitals:   01/11/15 1336  BP: 155/71  Pulse: 86  Temp: 99.1 F (37.3 C)  Resp: 20    GENERAL:alert, no distress, well nourished, well developed, comfortable, cooperative In a wheelchair SKIN: skin color, texture, turgor are normal, no rashes or significant lesions HEAD: Normocephalic, No masses, lesions, tenderness or abnormalities EYES: normal, PERRLA, EOMI, Conjunctiva are pink and non-injected EARS: External ears normal OROPHARYNX: lips, buccal mucosa, and tongue normal and mucous membranes are moist  NECK: supple, trachea midline LYMPH:  not examined BREAST:not examined LUNGS: Decreased breath sounds in the left lower lung. Otherwise clear HEART: S1/S2 audible, regular ABDOMEN:abdomen soft and normal bowel  sounds, exam limited secondary to patient sitting in wheelchair BACK: Back symmetric, no curvature. EXTREMITIES:less then 2 second capillary refill, no joint deformities, effusion, or inflammation, no cyanosis  NEURO: alert & oriented x 3 with fluent speech, no focal motor/sensory deficits, in a wheelchair, mini-mental status exam is negative.   LABORATORY DATA: CBC    Component Value Date/Time   WBC 5.9 11/20/2014 0637   RBC 3.05* 11/20/2014 0637   HGB 10.1* 11/20/2014 0637   HCT 31.9* 11/20/2014 0637   PLT 279 11/20/2014 0637   MCV 104.6*  11/20/2014 0637   MCH 33.1 11/20/2014 0637   MCHC 31.7 11/20/2014 0637   RDW 15.0 11/20/2014 0637   LYMPHSABS 0.9 11/15/2014 2105   MONOABS 0.8 11/15/2014 2105   EOSABS 0.2 11/15/2014 2105   BASOSABS 0.0 11/15/2014 2105      Chemistry      Component Value Date/Time   NA 141 11/20/2014 0637   K 4.0 11/20/2014 0637   CL 104 11/20/2014 0637   CO2 28 11/20/2014 0637   BUN 11 11/20/2014 0637   CREATININE 1.82* 11/20/2014 0637   CREATININE 2.07* 06/24/2014 1239      Component Value Date/Time   CALCIUM 8.9 11/20/2014 0637   ALKPHOS 39 10/20/2014 1530   AST 23 10/20/2014 1530   ALT 16 10/20/2014 1530   BILITOT 0.8 10/20/2014 1530      RADIOGRAPHIC STUDIES:    CLINICAL DATA: Lung cancer  EXAM: CT CHEST WITHOUT CONTRAST  TECHNIQUE: Multidetector CT imaging of the chest was performed following the standard protocol without IV contrast.  COMPARISON: 11/19/2014  FINDINGS: Mediastinum: The heart size is mildly enlarged. Aortic atherosclerosis noted. Calcifications within the LAD and left circumflex coronary artery noted. No enlarged mediastinal or hilar lymph nodes.  Lungs/Pleura: There is no pleural effusion. Paramediastinal radiation changes identified within the left lung. Compared with previous exam the left lower lobe consolidation is stable to improved in the interval. The previously noted left pleural effusion has nearly completely resolved in the interval. Moderate changes of centrilobular and paraseptal emphysema identified. No new or enlarging pulmonary nodules or masses identified.  Upper Abdomen: Low-attenuation structure within the left hepatic lobe measures 2.3 cm, image 52 of series 2. Unchanged from previous exam. There is a stable low density structure within segment 7 of the liver measuring 1.3 cm, image 47/series 2. The nodule in the left adrenal gland measures 2.5 cm and is stable from the previous examination. The right adrenal gland  appears normal.  Musculoskeletal: There is no aggressive lytic or sclerotic bone lesion identified.  IMPRESSION: 1. Paramediastinal radiation change within the left lung is stable to improved in the interval. Currently there are no specific features identified to suggest residual tumor or progression of disease. 2. Resolution of left pleural effusion. 3. Emphysema 4. Aortic atherosclerosis 5. Stable low-density structures within the liver.   Electronically Signed  By: Kerby Moors M.D.  On: 01/07/2015 13:02  ASSESSMENT AND PLAN:  Locally Advanced Squamous Cell Carcinoma of [facility] [facility] admission secondary to CAP Depression Limited PS, participating in [place]  She is currently doing okay. She is very pleased with the [facility]. I am going to try to get her a portable oxygen concentrator as this will certainly help her mobility. I reviewed her CT scans with her and she is pleased with the results. We will plan on seeing her back in 3 months with a repeat office visit and physical exam.  All questions were answered. The patient knows  to call the clinic with any problems, questions or concerns. We can certainly see the patient much sooner if necessary.   This document serves as a record of services personally performed by Ancil Linsey, MD. It was created on her behalf by Janace Hoard, a trained medical scribe. The creation of this record is based on the scribe's personal observations and the provider's statements to them. This document has been checked and approved by the attending provider.  I have reviewed the above documentation for accuracy and completeness, and I agree with the above.  This note was electronically signed.  Kelby Fam. Whitney Muse, MD

## 2015-01-11 NOTE — Patient Instructions (Signed)
[facility] at [facility] Discharge Instructions  RECOMMENDATIONS MADE BY THE CONSULTANT AND ANY TEST RESULTS WILL BE SENT TO YOUR REFERRING PHYSICIAN.  Exam and discussion by Dr. Whitney Muse. Your scan was great.   Will give you a prescription for a portable oxygen concentrator. Call with any concerns.  Port flushes every 6 - 8 weeks and office visit in 3 months.  Thank you for choosing [place] at [facility] to provide your oncology and hematology care.  To afford each patient quality time with our provider, please arrive at least 15 minutes before your scheduled appointment time.    You need to re-schedule your appointment should you arrive 10 or more minutes late.  We strive to give you quality time with our providers, and arriving late affects you and other patients whose appointments are after yours.  Also, if you no show three or more times for appointments you may be dismissed from the clinic at the providers discretion.     Again, thank you for choosing [facility]  Our hope is that these requests will decrease the amount of time that you wait before being seen by our physicians.       _____________________________________________________________  Should you have questions after your visit to [facility], please contact our office at [phone] between the hours of 8:30 a.m. and 4:30 p.m.  Voicemails left after 4:30 p.m. will not be returned until the following business day.  For prescription refill requests, have your pharmacy contact our office.

## 2015-01-11 NOTE — Progress Notes (Signed)
Meda Klinefelter presented for Portacath access and flush. Proper placement of portacath confirmed by CXR after placement. Portacath located right chest wall accessed with  H 20 needle. Good blood return present. Portacath flushed with 30m NS and 500U/51mHeparin and needle removed intact. Procedure without incident. Patient tolerated procedure well.

## 2015-01-18 ENCOUNTER — Ambulatory Visit: Payer: Medicare Other | Admitting: Family Medicine

## 2015-01-19 ENCOUNTER — Ambulatory Visit (HOSPITAL_COMMUNITY): Payer: Medicare Other | Admitting: Physical Therapy

## 2015-01-19 DIAGNOSIS — R29898 Other symptoms and signs involving the musculoskeletal system: Secondary | ICD-10-CM

## 2015-01-19 DIAGNOSIS — R2689 Other abnormalities of gait and mobility: Secondary | ICD-10-CM | POA: Diagnosis not present

## 2015-01-19 DIAGNOSIS — R262 Difficulty in walking, not elsewhere classified: Secondary | ICD-10-CM

## 2015-01-19 DIAGNOSIS — R530 Neoplastic (malignant) related fatigue: Secondary | ICD-10-CM

## 2015-01-19 NOTE — Therapy (Signed)
[facility] [place], [place], [address] Phone: [phone]   Fax:  [phone]  Physical Therapy Treatment  Patient Details  Name: [name] MRN: [id number] Date of Birth: 29-Mar-1935 Referring Provider:  Patrici Ranks, MD  Encounter Date: 01/19/2015      PT End of Session - 01/19/15 1744    Visit Number 2   Number of Visits 12   Date for PT Re-Evaluation 01/30/15   Authorization Type medicare   Authorization - Visit Number 2   Authorization - Number of Visits 10   PT Start Time 1638   PT Stop Time 1710   PT Time Calculation (min) 65 min   Activity Tolerance Patient limited by fatigue   Behavior During Therapy [facility] for tasks assessed/performed      Past Medical History  Diagnosis Date  . Hypertension   . Hypothyroidism   . Hyperlipidemia   . Insomnia   . GERD (gastroesophageal reflux disease)     chronic gastritis  . Adrenal adenoma   . Chronic diarrhea   . Fatty liver   . Arthritis     knee  . QT prolongation     syncope  . Coronary artery disease   . Impaired fasting glucose   . Renal insufficiency     low protien diet  . Tobacco use     1/2 ppd, approx 25-50 pack years (as of 08/2012)  . Family history of heart disease   . IBS (irritable bowel syndrome)   . Peripheral arterial disease     sstatus post  infrarenal abdominal aortic tube graft placed by Dr. Victorino Dike February 2008  . Cancer     Lung  . Squamous cell carcinoma of left lung 07/29/2013  . HCAP (healthcare-associated pneumonia)     10/2014  . Acute respiratory failure     10/2014  . COPD (chronic obstructive pulmonary disease)     moderat  . Pleural effusion     Past Surgical History  Procedure Laterality Date  . Abdominal hysterectomy    . Thyroidectomy, partial    . Colonoscopy  5/04    normal  . Abdominal aortic aneurysm repair  07/2006    D. J.D. Kellie Simmering  . Cataract extraction Bilateral 2010  . Cardiac catheterization   02/2010    mod CAD in L-dominant system with normal LV function  . Colon resection  2005    1/2 colon removed  . Transthoracic echocardiogram  02/2010    EF 55-60%; mild conc LVH, normal systolic function; mildly calcified AV annulus  . Colonoscopy with esophagogastroduodenoscopy (egd) N/A 03/19/2013    Procedure: COLONOSCOPY WITH ESOPHAGOGASTRODUODENOSCOPY (EGD);  Surgeon: Rogene Houston, MD;  [facility];  Service: Endoscopy;  Laterality: N/A;  145  . Dg biopsy lung Left Jan 2015    There were no vitals filed for this visit.  Visit Diagnosis:  Neoplastic malignant related fatigue  Leg weakness, bilateral  Difficulty walking  Unstable balance      Subjective Assessment - 01/19/15 1739    Subjective Pt comes today wtih daughter present, not using AD and pushing rolling 02 tank.  Pt was very unsteady upon entrance, requiring mod assistance from therapist to regain balance.   Pt reports she is very tired but not hurting.   Pt admits to not doing any exericses and reports she has been unable to come for the past 2 weeks.   Currently in Pain? No/denies  [facility] Adult PT Treatment/Exercise - 01/19/15 0001    Ambulation/Gait   Ambulation/Gait Yes   Ambulation/Gait Assistance 3: Mod assist   Ambulation Distance (Feet) 300 Feet   Assistive device Rolling walker   Ambulation Surface Indoor;Level   Gait velocity slow   Knee/Hip Exercises: Standing   Functional Squat 10 reps   Other Standing Knee Exercises obstacles with cones for navigation   Knee/Hip Exercises: Supine   Bridges 10 reps   Straight Leg Raises 10 reps   Knee/Hip Exercises: Sidelying   Hip ABduction 10 reps                PT Education - 01/19/15 1743    Education provided Yes   Education Details given additional copy of HEP.  Given copy of initial evaluation and reviewed goals with patient   Person(s) Educated Patient;Child(ren)   Methods  Explanation;Handout   Comprehension Verbalized understanding;Need further instruction          PT Short Term Goals - 12/31/14 1704    PT SHORT TERM GOAL #1   Title Pt to be I in HEP   Time 3   Period Weeks   PT SHORT TERM GOAL #2   Title Pt to be able to stand for 15 minutes to complete grooming tasks in the morning   Time 3   Period Weeks   PT SHORT TERM GOAL #3   Title Pt to be strength of LE to be increased one grade to allow sit to stand on one attempt    Time 3   Period Weeks   PT SHORT TERM GOAL #4   Title Pt to be and to ambulate x 15 minutes without fatigue   Time 3   Period Weeks           PT Long Term Goals - 12/31/14 1706    PT LONG TERM GOAL #1   Title Pt strength to be increased to 4+/5 to allow reciprocal stair climbing   Time 6   Period Weeks   PT LONG TERM GOAL #2   Title Pt to be able to stand for 20 minutes to make a small meal    Time 4   Period Weeks   PT LONG TERM GOAL #3   Title Pt to be walking for 20-30 minutes a day for better health habitis.    Time 6   Period Weeks   PT LONG TERM GOAL #4   Title Pt TUG to be decreased to 15 seconds or less to reduce risk of falling    Time 6   Period Weeks               Plan - 01/19/15 1744    Clinical Impression Statement Focused session on safe ambulation, transfers and establishing HEP.  Pt is in definate need of RW at this point due to weakness and instablity.  Pt also has tendency to run into wall/ objects on Rt side.   Spoke with daughter about getting portable oxygen tank vs. attachement for side of walker to increase safety.  Pt unable to recall any of the exercises given at initiatl evaluation and duaghter present states she was not with her at that appoitnment.  Pt given additional copy and reviewed exericse. PT was able to complete with therpaist facilitation.  Pt extremely fatigued requiring rest breaks between each exercise.    Pt will benefit from skilled therapeutic intervention in  order to improve on the following deficits Abnormal gait;Decreased  activity tolerance;Decreased balance;Decreased range of motion;Decreased strength;Difficulty walking   Rehab Potential Good   PT Frequency 2x / week   PT Duration 6 weeks   PT Treatment/Interventions Gait training;Stair training;Functional mobility training;Therapeutic activities;Therapeutic exercise;Balance training;Patient/family education   PT Next Visit Plan Progress to standing balance actvities as tolerated.  Begin nustep for actvitiy tolerance.    PT Home Exercise Plan given   Consulted and Agree with Plan of Care Patient;Family member/caregiver        Problem List Patient Active Problem List   Diagnosis Date Noted  . Dementia with behavioral disturbance 12/08/2014  . Hyperglycemia 11/20/2014  . Pleural effusion 11/19/2014  . Acute respiratory failure with hypoxia 11/16/2014  . Pneumonia 11/15/2014  . HCAP (healthcare-associated pneumonia) 11/15/2014  . Hypoxia 11/15/2014  . Healthcare-associated pneumonia   . SOB (shortness of breath)   . Orthostatic hypotension 07/20/2014  . Loose stools 07/20/2014  . Failure to thrive in adult 07/20/2014  . Syncope 07/17/2014  . Near syncope 07/16/2014  . Cholelithiasis 11/07/2013  . AKI (acute kidney injury) 09/22/2013  . COPD, moderate 09/22/2013  . Squamous cell carcinoma of left lung 07/29/2013  . Anemia, iron deficiency 03/03/2013  . Coronary artery disease 02/28/2013  . Essential hypertension 02/28/2013  . Hyperlipidemia 02/28/2013  . Peripheral arterial disease 02/28/2013  . Generalized anxiety disorder 12/10/2012  . Insomnia 12/10/2012  . Esophageal reflux 12/10/2012  . Chronic renal insufficiency 11/07/2012  . Unspecified hypothyroidism 09/10/2012  . Pruritic dermatitis 09/10/2012    Teena Irani, PTA/CLT [phone]  01/19/2015, 5:49 PM  [facility] [address] [place], [place], [address] Phone:  [phone]   Fax:  [phone]

## 2015-01-21 ENCOUNTER — Ambulatory Visit (HOSPITAL_COMMUNITY): Payer: Medicare Other | Admitting: Physical Therapy

## 2015-01-21 DIAGNOSIS — R262 Difficulty in walking, not elsewhere classified: Secondary | ICD-10-CM | POA: Diagnosis not present

## 2015-01-21 DIAGNOSIS — R2689 Other abnormalities of gait and mobility: Secondary | ICD-10-CM

## 2015-01-21 DIAGNOSIS — R29898 Other symptoms and signs involving the musculoskeletal system: Secondary | ICD-10-CM | POA: Diagnosis not present

## 2015-01-21 DIAGNOSIS — R530 Neoplastic (malignant) related fatigue: Secondary | ICD-10-CM

## 2015-01-21 NOTE — Therapy (Signed)
[facility] [address] [place], [place], [address] Phone: [phone]   Fax:  [phone]  Physical Therapy Treatment  Patient Details  Name: [name] MRN: [id number] Date of Birth: 1935-01-16 Referring Provider:  Patrici Ranks, MD  Encounter Date: 01/21/2015      PT End of Session - 01/21/15 1645    Visit Number 3   Number of Visits 12   Date for PT Re-Evaluation 01/30/15   Authorization Type medicare   Authorization - Visit Number 3   Authorization - Number of Visits 10   PT Start Time 6256   PT Stop Time 1650   PT Time Calculation (min) 46 min   Equipment Utilized During Treatment Gait belt   Activity Tolerance Patient limited by fatigue   Behavior During Therapy [facility] for tasks assessed/performed      Past Medical History  Diagnosis Date  . Hypertension   . Hypothyroidism   . Hyperlipidemia   . Insomnia   . GERD (gastroesophageal reflux disease)     chronic gastritis  . Adrenal adenoma   . Chronic diarrhea   . Fatty liver   . Arthritis     knee  . QT prolongation     syncope  . Coronary artery disease   . Impaired fasting glucose   . Renal insufficiency     low protien diet  . Tobacco use     1/2 ppd, approx 25-50 pack years (as of 08/2012)  . Family history of heart disease   . IBS (irritable bowel syndrome)   . Peripheral arterial disease     sstatus post  infrarenal abdominal aortic tube graft placed by Dr. Victorino Dike February 2008  . Cancer     Lung  . Squamous cell carcinoma of left lung 07/29/2013  . HCAP (healthcare-associated pneumonia)     10/2014  . Acute respiratory failure     10/2014  . COPD (chronic obstructive pulmonary disease)     moderat  . Pleural effusion     Past Surgical History  Procedure Laterality Date  . Abdominal hysterectomy    . Thyroidectomy, partial    . Colonoscopy  5/04    normal  . Abdominal aortic aneurysm repair  07/2006    D. J.D. Kellie Simmering  . Cataract  extraction Bilateral 2010  . Cardiac catheterization  02/2010    mod CAD in L-dominant system with normal LV function  . Colon resection  2005    1/2 colon removed  . Transthoracic echocardiogram  02/2010    EF 55-60%; mild conc LVH, normal systolic function; mildly calcified AV annulus  . Colonoscopy with esophagogastroduodenoscopy (egd) N/A 03/19/2013    Procedure: COLONOSCOPY WITH ESOPHAGOGASTRODUODENOSCOPY (EGD);  Surgeon: Rogene Houston, MD;  [facility];  Service: Endoscopy;  Laterality: N/A;  145  . Dg biopsy lung Left Jan 2015    There were no vitals filed for this visit.  Visit Diagnosis:  Neoplastic malignant related fatigue  Leg weakness, bilateral  Difficulty walking  Unstable balance      Subjective Assessment - 01/21/15 1641    Subjective Pt states she is tired, not hurting.  Daughter reports she is using her RW now but just left it in the car today. PT admits to not doing her HEP yesterday.   Currently in Pain? No/denies  [facility] Adult PT Treatment/Exercise - 01/21/15 1643    Ambulation/Gait   Ambulation/Gait Yes   Ambulation/Gait Assistance 3: Mod assist   Ambulation Distance (Feet) 455 Feet  total, 255' X 1, 200' X 1   Assistive device Rolling walker   Knee/Hip Exercises: Aerobic   Nustep 10 minutes level 1, flat surface  UE/LE   Knee/Hip Exercises: Seated   Sit to Sand without UE support;10 reps                  PT Short Term Goals - 12/31/14 1704    PT SHORT TERM GOAL #1   Title Pt to be I in HEP   Time 3   Period Weeks   PT SHORT TERM GOAL #2   Title Pt to be able to stand for 15 minutes to complete grooming tasks in the morning   Time 3   Period Weeks   PT SHORT TERM GOAL #3   Title Pt to be strength of LE to be increased one grade to allow sit to stand on one attempt    Time 3   Period Weeks   PT SHORT TERM GOAL #4   Title Pt to be and to ambulate x 15 minutes without fatigue    Time 3   Period Weeks           PT Long Term Goals - 12/31/14 1706    PT LONG TERM GOAL #1   Title Pt strength to be increased to 4+/5 to allow reciprocal stair climbing   Time 6   Period Weeks   PT LONG TERM GOAL #2   Title Pt to be able to stand for 20 minutes to make a small meal    Time 4   Period Weeks   PT LONG TERM GOAL #3   Title Pt to be walking for 20-30 minutes a day for better health habitis.    Time 6   Period Weeks   PT LONG TERM GOAL #4   Title Pt TUG to be decreased to 15 seconds or less to reduce risk of falling    Time 6   Period Weeks               Plan - 01/21/15 1647    Clinical Impression Statement Pt with noted improvement in gait quality and fatigue as compared to last session.  Pt able to complete 10 STS without use of UE's and ambulate further today without rest break.  Pt able to ambulate 255 feet in 5 minutes and did not require therapist cues for navigation as last session (pt was runnign into objects/wall on Rt side).  Added nustep at EOS to further increase actvitiy tolerance.    PT Next Visit Plan Progress to standing balance actvities as tolerated.  Begin nustep for actvitiy tolerance.    PT Home Exercise Plan given   Consulted and Agree with Plan of Care Patient;Family member/caregiver        Problem List Patient Active Problem List   Diagnosis Date Noted  . Dementia with behavioral disturbance 12/08/2014  . Hyperglycemia 11/20/2014  . Pleural effusion 11/19/2014  . Acute respiratory failure with hypoxia 11/16/2014  . Pneumonia 11/15/2014  . HCAP (healthcare-associated pneumonia) 11/15/2014  . Hypoxia 11/15/2014  . Healthcare-associated pneumonia   . SOB (shortness of breath)   . Orthostatic hypotension 07/20/2014  . Loose stools 07/20/2014  . Failure to thrive in adult 07/20/2014  . Syncope 07/17/2014  . Near syncope 07/16/2014  .  Cholelithiasis 11/07/2013  . AKI (acute kidney injury) 09/22/2013  . COPD, moderate  09/22/2013  . Squamous cell carcinoma of left lung 07/29/2013  . Anemia, iron deficiency 03/03/2013  . Coronary artery disease 02/28/2013  . Essential hypertension 02/28/2013  . Hyperlipidemia 02/28/2013  . Peripheral arterial disease 02/28/2013  . Generalized anxiety disorder 12/10/2012  . Insomnia 12/10/2012  . Esophageal reflux 12/10/2012  . Chronic renal insufficiency 11/07/2012  . Unspecified hypothyroidism 09/10/2012  . Pruritic dermatitis 09/10/2012    Teena Irani, PTA/CLT [phone]  01/21/2015, 4:49 PM  [facility] [address] [place], [place], [address] Phone: [phone]   Fax:  [phone]

## 2015-01-22 ENCOUNTER — Encounter: Payer: Self-pay | Admitting: Family Medicine

## 2015-01-22 ENCOUNTER — Ambulatory Visit (INDEPENDENT_AMBULATORY_CARE_PROVIDER_SITE_OTHER): Payer: Medicare Other | Admitting: Family Medicine

## 2015-01-22 VITALS — BP 138/86 | Temp 99.1°F | Ht 67.0 in

## 2015-01-22 DIAGNOSIS — J189 Pneumonia, unspecified organism: Secondary | ICD-10-CM | POA: Diagnosis not present

## 2015-01-22 DIAGNOSIS — R509 Fever, unspecified: Secondary | ICD-10-CM | POA: Diagnosis not present

## 2015-01-22 MED ORDER — LEVOFLOXACIN 500 MG PO TABS: 500.0000 mg | ORAL_TABLET | Freq: Every day | ORAL | Status: DC

## 2015-01-22 MED ORDER — CEFTRIAXONE SODIUM 1 G IJ SOLR
500.0000 mg | Freq: Once | INTRAMUSCULAR | Status: AC
  Administered 2015-01-22: 500 mg via INTRAMUSCULAR

## 2015-01-22 NOTE — Progress Notes (Signed)
   Subjective:    Patient ID: [name], female    DOB: 03/03/1935, 80 y.o.   MRN: [id number]  Fever  This is a new problem. The current episode started today. Associated symptoms include congestion and coughing. She has tried nothing for the symptoms.   Past couple days patient has been feeling poorly. Low-grade fever. More cough. Still using oxygen.  Prior notes reviewed. Had chest scan which showed improvement in pneumonia a couple weeks ago. Infiltrate was nearly resolved. Was shown old scarring from treatment.  A bit more coughing past couple days productive at times   Still having to symptoms at times of early possible dementia. Due to see a neurologist soon.   Review of Systems  Constitutional: Positive for fever.  HENT: Positive for congestion.   Respiratory: Positive for cough.        Objective:   Physical Exam  Vitals stable. H&T moderate nasal congestion lungs occasional cough no wheezes no crackles no tachypnea heart regular in rhythm.      Assessment & Plan:  Impression 1 possible early pneumonia discussed #2 mild dementia and discussed #3 improvement with lung cancer treatment discuss plan 25 minutes spent most in discussion. Rocephin injection today. Levaquin daily for 10 days. Symptomatic care discussed WSL

## 2015-01-25 ENCOUNTER — Ambulatory Visit (HOSPITAL_COMMUNITY): Payer: Medicare Other | Admitting: Physical Therapy

## 2015-01-26 ENCOUNTER — Encounter: Payer: Self-pay | Admitting: Neurology

## 2015-01-26 ENCOUNTER — Ambulatory Visit (INDEPENDENT_AMBULATORY_CARE_PROVIDER_SITE_OTHER): Payer: Medicare Other | Admitting: Neurology

## 2015-01-26 VITALS — BP 126/68 | HR 97 | Resp 16 | Ht 67.0 in | Wt 180.0 lb

## 2015-01-26 DIAGNOSIS — C3432 Malignant neoplasm of lower lobe, left bronchus or lung: Secondary | ICD-10-CM

## 2015-01-26 DIAGNOSIS — F329 Major depressive disorder, single episode, unspecified: Secondary | ICD-10-CM | POA: Insufficient documentation

## 2015-01-26 DIAGNOSIS — F0390 Unspecified dementia without behavioral disturbance: Secondary | ICD-10-CM

## 2015-01-26 DIAGNOSIS — I10 Essential (primary) hypertension: Secondary | ICD-10-CM | POA: Diagnosis not present

## 2015-01-26 DIAGNOSIS — F03B0 Unspecified dementia, moderate, without behavioral disturbance, psychotic disturbance, mood disturbance, and anxiety: Secondary | ICD-10-CM | POA: Insufficient documentation

## 2015-01-26 DIAGNOSIS — F32A Depression, unspecified: Secondary | ICD-10-CM | POA: Insufficient documentation

## 2015-01-26 DIAGNOSIS — E038 Other specified hypothyroidism: Secondary | ICD-10-CM

## 2015-01-26 MED ORDER — MEMANTINE HCL ER 28 MG PO CP24: 28.0000 mg | ORAL_CAPSULE | Freq: Every day | ORAL | Status: DC

## 2015-01-26 MED ORDER — DONEPEZIL HCL 10 MG PO TABS: ORAL_TABLET | ORAL | Status: DC

## 2015-01-26 NOTE — Progress Notes (Signed)
NEUROLOGY CONSULTATION NOTE  [name] MRN: [id number] DOB: 05-18-1935  Referring provider: Dr. Baltazar [name] Primary care provider: Dr. Baltazar [name]  Reason for consult:  Memory loss, confusion  Dear Dr [name]:  Thank you for your kind referral of [name] for consultation of the above symptoms. Although her history is well known to you, please allow me to reiterate it for the purpose of our medical record. The patient was accompanied to the clinic by her daughter who also provides collateral information. Records and images were personally reviewed where available.  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 She reports her memory is not as good as it was, she occasionally forgets names and conversation and has occasional word-finding difficulties. She lives with her daughter, she started noticing cognitive changes 2 years ago while undergoing chemotherapy, which they attributed to "chemo brain." She would ask the same questions, or say a different word from what she meant to say. Over the past 6 months, cognition has been worsening. She has done things out of character, such as leaving things in places she normally would not have. She has worn a wristwatch daily as long as her daughter can remember, then could not recall where she left it. Since Christmas, family gave her a new watch, and she now wears this daily again. She has some difficulties following instructions, her daughter tells her to put her O2 nasal cannula on and she needs to think about it for a minute. She is "more mean" and agitated when she feels like she is being bossed around, there is a lot of depression from not being able to bounce back after the chemotherapy.  Her daughter took over the bills 2 years ago when she was diagnosed with cancer. She stopped driving 2 years ago as well. She retired from working in [employer], educational attainment 7th grade.  She has headaches when more anxious, and reports a headache that started when she got to the office today. No nausea/vomiting/photo/phonophobia. She has dizziness when anxious. She has occasional blurred vision, but not as bad as when she was in the hospital last May for pneumonia. She denies any diplopia, dysarthria, dysphagia, neck pain. She has occasional right foot tingling and pin on the dorsum of the right foot. She has chronic back pain and incontinence, using adult diapers at night. Family helps her with dressing but she is mostly able to do it herself. She had seen neurologist Dr. Merlene [name] and was started on Aricept, she is on '5mg'$  daily without side effects. Her sister has memory issues from hypoxic injury, she denies any significant head injuries or alcohol use.   I personally reviewed MRI brain with and without contrast done 09/2014 which did not show any acute changes, there was mild to moderate diffuse atrophy and chronic microvascular disease. No abnormal enhancement.  Laboratory Data: Lab Results  Component Value Date   WBC 5.9 11/20/2014   HGB 10.1* 11/20/2014   HCT 31.9* 11/20/2014   MCV 104.6* 11/20/2014   PLT 279 11/20/2014     Chemistry      Component Value Date/Time   NA 141 11/20/2014 0637   K 4.0 11/20/2014 0637   CL 104 11/20/2014 0637   CO2 28 11/20/2014 0637   BUN 11 11/20/2014 6384  CREATININE 1.82* 11/20/2014 0637   CREATININE 2.07* 06/24/2014 1239      Component Value Date/Time   CALCIUM 8.9 11/20/2014 0637   ALKPHOS 39 10/20/2014 1530   AST 23 10/20/2014 1530   ALT 16 10/20/2014 1530   BILITOT 0.8 10/20/2014 1530     Lab Results  Component Value Date   TSH 1.783 07/16/2014   Lab Results  Component Value Date   IOXBDZHG99 242 10/20/2014     PAST  MEDICAL HISTORY: Past Medical History  Diagnosis Date  . Hypertension   . Hypothyroidism   . Hyperlipidemia   . Insomnia   . GERD (gastroesophageal reflux disease)     chronic gastritis  . Adrenal adenoma   . Chronic diarrhea   . Fatty liver   . Arthritis     knee  . QT prolongation     syncope  . Coronary artery disease   . Impaired fasting glucose   . Renal insufficiency     low protien diet  . Tobacco use     1/2 ppd, approx 25-50 pack years (as of 08/2012)  . Family history of heart disease   . IBS (irritable bowel syndrome)   . Peripheral arterial disease     sstatus post  infrarenal abdominal aortic tube graft placed by Dr. Victorino [name] February 2008  . Cancer     Lung  . Squamous cell carcinoma of left lung 07/29/2013  . HCAP (healthcare-associated pneumonia)     10/2014  . Acute respiratory failure     10/2014  . COPD (chronic obstructive pulmonary disease)     moderat  . Pleural effusion     PAST SURGICAL HISTORY: Past Surgical History  Procedure Laterality Date  . Abdominal hysterectomy    . Thyroidectomy, partial    . Colonoscopy  5/04    normal  . Abdominal aortic aneurysm repair  07/2006    D. [name]  . Cataract extraction Bilateral 2010  . Cardiac catheterization  02/2010    mod CAD in L-dominant system with normal LV function  . Colon resection  2005    1/2 colon removed  . Transthoracic echocardiogram  02/2010    EF 55-60%; mild conc LVH, normal systolic function; mildly calcified AV annulus  . Colonoscopy with esophagogastroduodenoscopy (egd) N/A 03/19/2013    Procedure: COLONOSCOPY WITH ESOPHAGOGASTRODUODENOSCOPY (EGD);  Surgeon: Leah Houston, MD;  [facility];  Service: Endoscopy;  Laterality: N/A;  145  . Dg biopsy lung Left Jan 2015    MEDICATIONS: Current Outpatient Prescriptions on File Prior to Visit  Medication Sig Dispense Refill  . allopurinol (ZYLOPRIM) 300 MG tablet Take 1 tablet (300 mg total) by mouth daily. 30  tablet 5  . ALPRAZolam (XANAX) 1 MG tablet TAKE ONE-HALF TO ONE TABLET BY MOUTH THREE TIMES DAILY AS NEEDED FOR ANXIETY 90 tablet 5  . amLODipine (NORVASC) 5 MG tablet take 1 tablet by mouth once daily 30 tablet 3  . aspirin EC 81 MG tablet Take 81 mg by mouth daily.    . cyanocobalamin (,VITAMIN B-12,) 1000 MCG/ML injection Inject 1,000 mcg into the muscle once.    . diphenoxylate-atropine (LOMOTIL) 2.5-0.025 MG per tablet Take 1 tablet by mouth 4 (four) times daily as needed for diarrhea or loose stools. 60 tablet 0  . donepezil (ARICEPT) 5 MG tablet Take 5 mg by mouth daily.  0  . DULoxetine (CYMBALTA) 60 MG capsule Take 1 capsule (60 mg total) by mouth daily. [place]  capsule 5  . enalapril (VASOTEC) 10 MG tablet take 1 tablet by mouth once daily 30 tablet 5  . esomeprazole (NEXIUM) 40 MG capsule take 1 capsule by mouth once daily 30 capsule 5  . fenofibrate 160 MG tablet Take 160 mg by mouth daily.  0  . ferrous sulfate (FERROUSUL) 325 (65 FE) MG tablet Take 1 tablet (325 mg total) by mouth 2 (two) times daily after a meal.  3  . folic acid (FOLVITE) 1 MG tablet Take 1 mg by mouth daily.  1  . hydrALAZINE (APRESOLINE) 25 MG tablet take 1 tablet by mouth three times a day 90 tablet 5  . levofloxacin (LEVAQUIN) 500 MG tablet Take 1 tablet (500 mg total) by mouth daily. For 10 days 10 tablet 0  . levothyroxine (SYNTHROID, LEVOTHROID) 137 MCG tablet Take 1 tablet (137 mcg total) by mouth daily. 30 tablet 5  . ondansetron (ZOFRAN ODT) 4 MG disintegrating tablet Take 1 tablet (4 mg total) by mouth every 8 (eight) hours as needed for nausea or vomiting. 30 tablet 0  . potassium chloride 20 MEQ TBCR Take 10 mEq by mouth daily. 30 tablet 6  . pravastatin (PRAVACHOL) 20 MG tablet take 1 tablet by mouth once daily 30 tablet 3  . SPIRIVA HANDIHALER 18 MCG inhalation capsule Place 18 mcg into inhaler and inhale daily.   0  . temazepam (RESTORIL) 30 MG capsule Take 1 capsule (30 mg total) by mouth at bedtime as  needed for sleep. 30 capsule 2  . traMADol (ULTRAM) 50 MG tablet Take 1 tablet (50 mg total) by mouth every 6 (six) hours as needed (pain). 60 tablet 0  . Vitamin D, Ergocalciferol, (DRISDOL) 50000 UNITS CAPS capsule Take 50,000 Units by mouth once a week.  1   No current facility-administered medications on file prior to visit.    ALLERGIES: Allergies  Allergen Reactions  . Aleve [Naproxen Sodium] Other (See Comments)    GI bleed  . Dilaudid [Hydromorphone Hcl]     Can not tolerate  . Dyazide [Hydrochlorothiazide W-Triamterene] Other (See Comments)    cramps  . Zithromax [Azithromycin] Itching    FAMILY HISTORY: Family History  Problem Relation Age of Onset  . Hypertension Mother   . Diabetes Mother   . Heart attack Mother   . Hyperlipidemia Sister     x3 sister  . Kidney disease Brother     SOCIAL HISTORY: History   Social History  . Marital Status: Divorced    Spouse Name: N/A  . Number of Children: 2  . Years of Education: N/A   Occupational History  .     Social History Main Topics  . Smoking status: Former Smoker -- 0.50 packs/day for 59 years    Types: Cigarettes  . Smokeless tobacco: Never Used     Comment: smoking since age 9/18  . Alcohol Use: No  . Drug Use: No  . Sexual Activity: Not on file   Other Topics Concern  . Not on file   Social History Narrative    REVIEW OF SYSTEMS: Constitutional: No fevers, chills, or sweats, no generalized fatigue, change in appetite Eyes: No visual changes, double vision, eye pain Ear, nose and throat: No hearing loss, ear pain, nasal congestion, sore throat Cardiovascular: No chest pain, palpitations Respiratory:  No shortness of breath at rest or with exertion, wheezes GastrointestinaI: No nausea, vomiting, diarrhea, abdominal pain, fecal incontinence Genitourinary:  No dysuria, urinary retention or frequency Musculoskeletal:  No neck  pain, back pain Integumentary: No rash, pruritus, skin  lesions Neurological: as above Psychiatric: No depression, insomnia, anxiety Endocrine: No palpitations, fatigue, diaphoresis, mood swings, change in appetite, change in weight, increased thirst Hematologic/Lymphatic:  No anemia, purpura, petechiae. Allergic/Immunologic: no itchy/runny eyes, nasal congestion, recent allergic reactions, rashes  PHYSICAL EXAM: Filed Vitals:   01/26/15 1025  BP: 126/68  Pulse: 97  Resp: 16   General: No acute distress, flat affect, speaks slowly with mild dysarthria but able to communicate and follow commands Head:  Normocephalic/atraumatic Eyes: Fundoscopic exam shows bilateral sharp discs, no vessel changes, exudates, or hemorrhages Neck: supple, no paraspinal tenderness, full range of motion Back: No paraspinal tenderness Heart: regular rate and rhythm Lungs: Clear to auscultation bilaterally. Vascular: No carotid bruits. Skin/Extremities: No rash, no edema Neurological Exam: Mental status: alert and oriented to person, place,mild dysarthria, no aphasia, Fund of knowledge is appropriate.  Remote memory intact.  Attention and concentration are normal.    Able to name objects and repeat phrases. Clock drawing 0/5 (see attached) MMSE - Mini Mental State Exam 01/26/2015  Orientation to time 0  Orientation to Place 4  Registration 3  Attention/ Calculation 1  Recall 0  Language- name 2 objects 2  Language- repeat 1  Language- follow 3 step command 2  Language- read & follow direction 1  Write a sentence 1  Copy design 0  Total score 15   Cranial nerves: CN I: not tested CN II: pupils equal, round and reactive to light, visual fields intact, fundi unremarkable. CN III, IV, VI:  full range of motion, no nystagmus, no ptosis CN V: facial sensation intact CN VII: upper and lower face symmetric CN VIII: hearing intact to finger rub CN IX, X: gag intact, uvula midline CN XI: sternocleidomastoid and trapezius muscles intact CN XII: tongue  midline Bulk & Tone: normal, no cogwheeling, no fasciculations. Motor: 5/5 throughout with no pronator drift. Sensation: decreased vibration up to right ankle, otherwise intact to light touch, cold, pin, and joint position sense.  No extinction to double simultaneous stimulation.  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 MMSE today 15/30, poor clock drawing. Symptoms suggestive of moderate dementia, likely Alzheimer's type. We discussed symptoms likely exacerbated by multiple medical issues (depression, hypoxemia, history of chemotherapy). MRI brain did not show any acute changes. We had an extensive discussion about diagnosis, prognosis, and medication expectations. She will increase Aricept to '10mg'$  daily, and start Namenda XR uptitration. Side effects were discussed. We also discussed her depression, which is likely contributing as well. She is agreeable to seeing Behavioral health for evaluation and management of depression. She will follow-up in 6 months and knows to call our office for any changes.   Thank you for allowing me to participate in the care of this patient. Please do not hesitate to call for any questions or concerns.   Ellouise Newer, M.D.  CC: Dr. Wolfgang [name]

## 2015-01-26 NOTE — Patient Instructions (Signed)
1. Increase Aricept '10mg'$  once a day 2. Start Namenda XR starter pack, once done, continue Namenda XR '28mg'$  daily 3. Refer to [facility] psychology and psychiatry for depression 4. Follow-up in 6 months, call for any problems

## 2015-01-28 ENCOUNTER — Ambulatory Visit (HOSPITAL_COMMUNITY): Payer: Medicare Other | Attending: Hematology & Oncology | Admitting: Physical Therapy

## 2015-01-28 DIAGNOSIS — R530 Neoplastic (malignant) related fatigue: Secondary | ICD-10-CM | POA: Insufficient documentation

## 2015-01-28 DIAGNOSIS — R2689 Other abnormalities of gait and mobility: Secondary | ICD-10-CM | POA: Insufficient documentation

## 2015-01-28 DIAGNOSIS — R262 Difficulty in walking, not elsewhere classified: Secondary | ICD-10-CM | POA: Diagnosis not present

## 2015-01-28 DIAGNOSIS — R29898 Other symptoms and signs involving the musculoskeletal system: Secondary | ICD-10-CM | POA: Insufficient documentation

## 2015-01-28 NOTE — Therapy (Signed)
[place], [place], [address] Phone: [phone]   Fax:  [phone]  Physical Therapy Treatment  Patient Details  Name: [name] MRN: [id number] Date of Birth: 1934/07/11 Referring Provider:  Mikey Kirschner, MD  Encounter Date: 01/28/2015      PT End of Session - 01/28/15 1652    Visit Number 4   Number of Visits 12   Date for PT Re-Evaluation 01/30/15   Authorization Type medicare   Authorization - Visit Number 4   Authorization - Number of Visits 10   PT Start Time 9371   PT Stop Time 1648   PT Time Calculation (min) 36 min      Past Medical History  Diagnosis Date  . Hypertension   . Hypothyroidism   . Hyperlipidemia   . Insomnia   . GERD (gastroesophageal reflux disease)     chronic gastritis  . Adrenal adenoma   . Chronic diarrhea   . Fatty liver   . Arthritis     knee  . QT prolongation     syncope  . Coronary artery disease   . Impaired fasting glucose   . Renal insufficiency     low protien diet  . Tobacco use     1/2 ppd, approx 25-50 pack years (as of 08/2012)  . Family history of heart disease   . IBS (irritable bowel syndrome)   . Peripheral arterial disease     sstatus post  infrarenal abdominal aortic tube graft placed by Dr. Victorino Dike February 2008  . Cancer     Lung  . Squamous cell carcinoma of left lung 07/29/2013  . HCAP (healthcare-associated pneumonia)     10/2014  . Acute respiratory failure     10/2014  . COPD (chronic obstructive pulmonary disease)     moderat  . Pleural effusion     Past Surgical History  Procedure Laterality Date  . Abdominal hysterectomy    . Thyroidectomy, partial    . Colonoscopy  5/04    normal  . Abdominal aortic aneurysm repair  07/2006    D. J.D. Kellie Simmering  . Cataract extraction Bilateral 2010  . Cardiac catheterization  02/2010    mod CAD in L-dominant system with normal LV function  . Colon resection  2005    1/2 colon removed   . Transthoracic echocardiogram  02/2010    EF 55-60%; mild conc LVH, normal systolic function; mildly calcified AV annulus  . Colonoscopy with esophagogastroduodenoscopy (egd) N/A 03/19/2013    Procedure: COLONOSCOPY WITH ESOPHAGOGASTRODUODENOSCOPY (EGD);  Surgeon: Rogene Houston, MD;  Location[facility];  Service: Endoscopy;  Laterality: N/A;  145  . Dg biopsy lung Left Jan 2015    There were no vitals filed for this visit.  Visit Diagnosis:  Neoplastic malignant related fatigue  Leg weakness, bilateral  Difficulty walking  Unstable balance      Subjective Assessment - 01/28/15 1630    Subjective Patient reports that she is just tired today, not hurting; entered clinic using [facility] but was not using AD correctly    Pertinent History Stage III B locally advanced squamous cell carcinoma of the left lower lobe, measuring 4.1 cm involving the left hilar and bilateral mediastinal lymph nodes with questionable hepatic lesions, status post combined modality therapy utilizing carboplatin/Taxol/radiotherapy with treatment ending on 09/12/2013. NED at this present time. CT head recently is negative for any intracranial metastatic disease and no oncologic cause of falling.  Restaging scans as planned in March 2016 with follow-up appointment. Port-flush and labs in March 2016: CBC diff, CMET   Currently in Pain? No/denies                         [facility] Adult PT Treatment/Exercise - 01/28/15 0001    Ambulation/Gait   Ambulation/Gait Yes   Ambulation/Gait Assistance 4: Min assist   Ambulation/Gait Assistance Details --  x61f, x269f x2058f Assistive device Large base quad cane   Gait Comments patient extremetly unsteady and using [facility], which she is not accustomed to using. Required Min(A) for blaance and to avoid obstacles however once quad cane was removed her gait mechanics and safety improved, possibly because she is not accustomed to device   Knee/Hip Exercises: Seated    Long Arc Quad Both;1 set;5 reps   Long Arc Quad Weight 2 lbs.   Long Arc[employer]X Corporation[employer]ations cues and motivation to participate in exercise    Knee/Hip Exercises: Supine   Bridges 10 reps   Bridges Limitations Min(A)   Straight Leg Raises Both;10 reps   Straight Leg Raises Limitations tactile cues                 PT Education - 01/28/15 1652    Education provided Yes   Education Details since patient is not [facility] to [facility] is unsafe with it at this point, it is a significatn fall risk for her    Person(s) Educated Patient;Child(ren)   Methods Explanation   Comprehension Verbalized understanding          PT Short Term Goals - 12/31/14 1704    PT SHORT TERM GOAL #1   Title Pt to be I in HEP   Time 3   Period Weeks   PT SHORT TERM GOAL #2   Title Pt to be able to stand for 15 minutes to complete grooming tasks in the morning   Time 3   Period Weeks   PT SHORT TERM GOAL #3   Title Pt to be strength of LE to be increased one grade to allow sit to stand on one attempt    Time 3   Period Weeks   PT SHORT TERM GOAL #4   Title Pt to be and to ambulate x 15 minutes without fatigue   Time 3   Period Weeks           PT Long Term Goals - 12/31/14 1706    PT LONG TERM GOAL #1   Title Pt strength to be increased to 4+/5 to allow reciprocal stair climbing   Time 6   Period Weeks   PT LONG TERM GOAL #2   Title Pt to be able to stand for 20 minutes to make a small meal    Time 4   Period Weeks   PT LONG TERM GOAL #3   Title Pt to be walking for 20-30 minutes a day for better health habitis.    Time 6   Period Weeks   PT LONG TERM GOAL #4   Title Pt TUG to be decreased to 15 seconds or less to reduce risk of falling    Time 6   Period Weeks               Plan - 01/28/15 1654    Clinical Impression Statement [facility], she is not used to, and with very unsteady gait today, required Min(A)  for navigation around obstacles and  safety/balance during gait. Very easily fatigued today and reports that she is not feeling very good today. Daughter and patient educated that since patient is not accustomed to using [facility],  and is very  unsafe with [facility] at this point, that it is a large fall risk for her right now.    Pt will benefit from skilled therapeutic intervention in order to improve on the following deficits Abnormal gait;Decreased activity tolerance;Decreased balance;Decreased range of motion;Decreased strength;Difficulty walking   Rehab Potential Good   PT Frequency 2x / week   PT Duration 6 weeks   PT Treatment/Interventions Gait training;Stair training;Functional mobility training;Therapeutic activities;Therapeutic exercise;Balance training;Patient/family education   PT Next Visit Plan Progress to standing balance actvities as tolerated.  Begin nustep for actvitiy tolerance.    PT Home Exercise Plan given   Consulted and Agree with Plan of Care Patient;Family member/caregiver        Problem List Patient Active Problem List   Diagnosis Date Noted  . Moderate dementia without behavioral disturbance 01/26/2015  . Malignant neoplasm of lower lobe of left lung 01/26/2015  . Depression 01/26/2015  . Other specified hypothyroidism 01/26/2015  . Dementia with behavioral disturbance 12/08/2014  . Hyperglycemia 11/20/2014  . Pleural effusion 11/19/2014  . Acute respiratory failure with hypoxia 11/16/2014  . Pneumonia 11/15/2014  . HCAP (healthcare-associated pneumonia) 11/15/2014  . Hypoxia 11/15/2014  . Healthcare-associated pneumonia   . SOB (shortness of breath)   . Orthostatic hypotension 07/20/2014  . Loose stools 07/20/2014  . Failure to thrive in adult 07/20/2014  . Syncope 07/17/2014  . Near syncope 07/16/2014  . Cholelithiasis 11/07/2013  . AKI (acute kidney injury) 09/22/2013  . COPD, moderate 09/22/2013  . Squamous cell carcinoma of left lung 07/29/2013  . Anemia, iron deficiency 03/03/2013  .  Coronary artery disease 02/28/2013  . Essential hypertension 02/28/2013  . Hyperlipidemia 02/28/2013  . Peripheral arterial disease 02/28/2013  . Generalized anxiety disorder 12/10/2012  . Insomnia 12/10/2012  . Esophageal reflux 12/10/2012  . Chronic renal insufficiency 11/07/2012  . Unspecified hypothyroidism 09/10/2012  . Pruritic dermatitis 09/10/2012    Deniece Ree PT, DPT [facility] [address] [place], [place], [address] Phone: [phone]   Fax:  [phone]

## 2015-02-01 ENCOUNTER — Ambulatory Visit (HOSPITAL_COMMUNITY): Payer: Medicare Other | Admitting: Physical Therapy

## 2015-02-01 ENCOUNTER — Other Ambulatory Visit: Payer: Self-pay | Admitting: Family Medicine

## 2015-02-01 NOTE — Therapy (Signed)
[place], [place], [address] Phone: [phone]   Fax:  [phone]  Patient Details  Name: [name] MRN: [id number] Date of Birth: Dec 10, 1934 Referring Provider: Whitney Muse  Encounter Date: 01/28/2015 PHYSICAL THERAPY DISCHARGE SUMMARY  Visits from Start of Care: 4  Current functional level related to goals / functional outcomes: Pt had only been see for 4 visits when family called to cancel future visits due to pt being,"too weak".  Explained that without exercising the pt would most likely not get any stronger but family request discharge.    Remaining deficits: Unstable gt, decreased strength.    Education / Equipment: HEP  Plan: Patient agrees to discharge.  Patient goals were not met. Patient is being discharged due to the patient's request.  ?????        Rayetta Humphrey, PT CLT [phone]  02/01/2015, 9:59 AM  [place], [place], [address] Phone: [phone]   Fax:  [phone]

## 2015-02-02 ENCOUNTER — Other Ambulatory Visit: Payer: Self-pay | Admitting: Family Medicine

## 2015-02-03 ENCOUNTER — Telehealth (HOSPITAL_COMMUNITY): Payer: Self-pay | Admitting: *Deleted

## 2015-02-03 ENCOUNTER — Telehealth: Payer: Self-pay | Admitting: Neurology

## 2015-02-03 NOTE — Telephone Encounter (Signed)
Records from Dr. Freddie Apley office reviewed  Normal TSH 2.177 and B12 301. Low vitamin D (10) She was initially seen 05/2014 for tremors. MRI brain 08/27/13 reported as negative for metastatic disease, chronic microvascular ischemic change, no acute infarct. Dx: Essential Tremor. Given long-standing history for decades and family history, this is undoubtedly the etiology. There are some subtle evidence of parkinsonism however, with rest tremor and cogwheeling on right>left.  Family reported dizziness and memory loss, confusion were worse on 09/2014 visit. MMSE 17/30. Dx: Alzheimer's disease. MRI brain ordered. Started on Aricept. MRI findings similar to prior.

## 2015-02-04 ENCOUNTER — Ambulatory Visit (HOSPITAL_COMMUNITY): Payer: Medicare Other | Admitting: Physical Therapy

## 2015-02-08 ENCOUNTER — Ambulatory Visit (HOSPITAL_COMMUNITY): Payer: Medicare Other | Admitting: Physical Therapy

## 2015-02-10 ENCOUNTER — Telehealth: Payer: Self-pay | Admitting: Neurology

## 2015-02-10 ENCOUNTER — Other Ambulatory Visit: Payer: Self-pay | Admitting: Family Medicine

## 2015-02-10 MED ORDER — QUETIAPINE FUMARATE 25 MG PO TABS: ORAL_TABLET | ORAL | Status: DC

## 2015-02-10 NOTE — Telephone Encounter (Signed)
I spoke with patient's daughter/Pat and notified of advisement. She states that the Namenda did seem to help patient rest better, since she is going to be stopping it for right now wanted to know if there is something else that Dr. Delice Lesch could recommend she take to help her rest better at night. I spoke with Dr. Delice Lesch and she advised Seroquel 25 mg 1/2 tablet at bedtime. Did caution her about increased sleepiness while on medication. Also told her that it could help calm some of patient's agitation. Will send new Rx to her pharmacy.

## 2015-02-10 NOTE — Telephone Encounter (Signed)
I spoke with [name]. She states that since patient has started Namenda she's more confused, agitated, and angry. She is still having periods where she is saying certain ppl are there when they're not. She is on her 2nd week of the starter pack.

## 2015-02-10 NOTE — Telephone Encounter (Signed)
Pt's daughter Fraser Din called to say that since she has been taking the Namenda she has gotten worse/Dawn [phone]

## 2015-02-10 NOTE — Telephone Encounter (Signed)
Pls let daughter know, that as we discussed, [place] is not a cure, so if it is causing more harm than benefit, would stop medication and see if symptoms improve off Namenda. If symptoms continue, symptoms are likely due to underlying dementia rather than medication. Thanks

## 2015-02-11 ENCOUNTER — Ambulatory Visit (HOSPITAL_COMMUNITY): Payer: Medicare Other | Admitting: Physical Therapy

## 2015-02-11 NOTE — Telephone Encounter (Signed)
Ok plus 5 monthly ref 

## 2015-02-25 ENCOUNTER — Encounter (HOSPITAL_COMMUNITY): Payer: Medicare Other

## 2015-02-25 ENCOUNTER — Other Ambulatory Visit (HOSPITAL_COMMUNITY): Payer: Medicare Other

## 2015-03-02 ENCOUNTER — Other Ambulatory Visit: Payer: Self-pay | Admitting: *Deleted

## 2015-03-02 ENCOUNTER — Telehealth: Payer: Self-pay | Admitting: Family Medicine

## 2015-03-02 ENCOUNTER — Ambulatory Visit: Payer: Medicare Other | Admitting: Family Medicine

## 2015-03-02 MED ORDER — TRAMADOL HCL 50 MG PO TABS: 50.0000 mg | ORAL_TABLET | Freq: Four times a day (QID) | ORAL | Status: DC | PRN

## 2015-03-02 NOTE — Telephone Encounter (Signed)
Patients daughter wanted Dr. Richardson Landry to know that patient complains of pain in her right arm very often.  Also, she will complain of achiness in her right knee and sometimes farther up her leg.  She did have a fall a few weeks back, but her daughter doesn't think this has anything to do with the pain she is experiencing.  She wants to know if we can refill her tramadol or call in another medicine for pain to help her.  She has an appointment on 03/15/2015.  [facility]

## 2015-03-02 NOTE — Telephone Encounter (Signed)
Daughter called. appt for today was canceled and rescheduled for sept 19th because pt is not in a good mental state today. [place] about 2 weeks ago and since fall has been complaining of right knee and right thigh pain. Pain when standing and walking. No swelling. Having right arm pain but that started years ago. Daughter would like to get her something for pain but unable to bring her in.

## 2015-03-02 NOTE — Telephone Encounter (Signed)
May ref tramadol

## 2015-03-02 NOTE — Telephone Encounter (Signed)
Med faxed to pharm. Daughter notified.

## 2015-03-08 ENCOUNTER — Encounter (HOSPITAL_COMMUNITY): Payer: Medicare Other | Attending: Hematology and Oncology

## 2015-03-08 DIAGNOSIS — C3432 Malignant neoplasm of lower lobe, left bronchus or lung: Secondary | ICD-10-CM | POA: Diagnosis not present

## 2015-03-08 DIAGNOSIS — C3492 Malignant neoplasm of unspecified part of left bronchus or lung: Secondary | ICD-10-CM | POA: Diagnosis not present

## 2015-03-08 DIAGNOSIS — Z452 Encounter for adjustment and management of vascular access device: Secondary | ICD-10-CM | POA: Diagnosis present

## 2015-03-08 LAB — CBC WITH DIFFERENTIAL/PLATELET
BASOS ABS: 0.1 10*3/uL (ref 0.0–0.1)
Basophils Relative: 1 % (ref 0–1)
EOS ABS: 2.5 10*3/uL — AB (ref 0.0–0.7)
Eosinophils Relative: 37 % — ABNORMAL HIGH (ref 0–5)
HCT: 34.2 % — ABNORMAL LOW (ref 36.0–46.0)
HEMOGLOBIN: 11.2 g/dL — AB (ref 12.0–15.0)
LYMPHS ABS: 1.3 10*3/uL (ref 0.7–4.0)
Lymphocytes Relative: 19 % (ref 12–46)
MCH: 33.3 pg (ref 26.0–34.0)
MCHC: 32.7 g/dL (ref 30.0–36.0)
MCV: 101.8 fL — ABNORMAL HIGH (ref 78.0–100.0)
MONO ABS: 0.5 10*3/uL (ref 0.1–1.0)
MONOS PCT: 8 % (ref 3–12)
Neutro Abs: 2.3 10*3/uL (ref 1.7–7.7)
Neutrophils Relative %: 35 % — ABNORMAL LOW (ref 43–77)
PLATELETS: 363 10*3/uL (ref 150–400)
RBC: 3.36 MIL/uL — AB (ref 3.87–5.11)
RDW: 15 % (ref 11.5–15.5)
WBC: 6.7 10*3/uL (ref 4.0–10.5)

## 2015-03-08 LAB — COMPREHENSIVE METABOLIC PANEL
ALBUMIN: 3.6 g/dL (ref 3.5–5.0)
ALT: 14 U/L (ref 14–54)
ANION GAP: 8 (ref 5–15)
AST: 36 U/L (ref 15–41)
Alkaline Phosphatase: 32 U/L — ABNORMAL LOW (ref 38–126)
BUN: 10 mg/dL (ref 6–20)
CHLORIDE: 102 mmol/L (ref 101–111)
CO2: 29 mmol/L (ref 22–32)
Calcium: 8.6 mg/dL — ABNORMAL LOW (ref 8.9–10.3)
Creatinine, Ser: 1.6 mg/dL — ABNORMAL HIGH (ref 0.44–1.00)
GFR calc Af Amer: 34 mL/min — ABNORMAL LOW (ref >60)
GFR calc non Af Amer: 30 mL/min — ABNORMAL LOW (ref >60)
GLUCOSE: 127 mg/dL — AB (ref 65–99)
POTASSIUM: 2.6 mmol/L — AB (ref 3.5–5.1)
SODIUM: 139 mmol/L (ref 135–145)
Total Bilirubin: 0.7 mg/dL (ref 0.3–1.2)
Total Protein: 6.2 g/dL — ABNORMAL LOW (ref 6.5–8.1)

## 2015-03-08 MED ORDER — HEPARIN SOD (PORK) LOCK FLUSH 100 UNIT/ML IV SOLN
500.0000 [IU] | Freq: Once | INTRAVENOUS | Status: AC
  Administered 2015-03-08: 500 [IU] via INTRAVENOUS

## 2015-03-08 MED ORDER — SODIUM CHLORIDE 0.9 % IJ SOLN
10.0000 mL | INTRAMUSCULAR | Status: DC | PRN
  Administered 2015-03-08: 10 mL via INTRAVENOUS
  Filled 2015-03-08: qty 10

## 2015-03-08 MED ORDER — POTASSIUM CHLORIDE ER 10 MEQ PO TBCR: EXTENDED_RELEASE_TABLET | ORAL | Status: DC

## 2015-03-08 NOTE — Progress Notes (Signed)
CRITICAL VALUE ALERT  Critical value received:  K 2.6  Date of notification:  03/08/15  Time of notification:  7939  Critical value read back:Yes.    Nurse who received alert:  Isidoro Donning RN  Kirby Crigler ordered 64mq of KDUR and asked that we send results to Dr. LWolfgang Phoenix Pt notified to pick up rx for Potassium tonight and to start taking. She said ok. Pt's sister said ok to instructions also.

## 2015-03-08 NOTE — Addendum Note (Signed)
Addended by: Gerhard Perches on: 03/08/2015 05:10 PM   Modules accepted: Orders

## 2015-03-08 NOTE — Progress Notes (Signed)
[name] presented for Portacath access and flush. Proper placement of portacath confirmed by CXR. Portacath located right chest wall accessed with  H 20 needle. Good blood return present.  Specimen drawn for labs. Portacath flushed with 3m NS and 500U/525mHeparin and needle removed intact. Procedure without incident. Patient tolerated procedure well.

## 2015-03-10 ENCOUNTER — Other Ambulatory Visit: Payer: Self-pay | Admitting: Family Medicine

## 2015-03-10 ENCOUNTER — Ambulatory Visit (HOSPITAL_COMMUNITY): Payer: Medicare Other | Admitting: Hematology & Oncology

## 2015-03-13 ENCOUNTER — Other Ambulatory Visit: Payer: Self-pay | Admitting: Family Medicine

## 2015-03-15 ENCOUNTER — Encounter: Payer: Self-pay | Admitting: Family Medicine

## 2015-03-15 ENCOUNTER — Ambulatory Visit (INDEPENDENT_AMBULATORY_CARE_PROVIDER_SITE_OTHER): Payer: Medicare Other | Admitting: Family Medicine

## 2015-03-15 VITALS — BP 122/78 | Ht 67.0 in | Wt 163.0 lb

## 2015-03-15 DIAGNOSIS — E785 Hyperlipidemia, unspecified: Secondary | ICD-10-CM

## 2015-03-15 DIAGNOSIS — Z23 Encounter for immunization: Secondary | ICD-10-CM

## 2015-03-15 DIAGNOSIS — Z79899 Other long term (current) drug therapy: Secondary | ICD-10-CM

## 2015-03-15 DIAGNOSIS — I10 Essential (primary) hypertension: Secondary | ICD-10-CM

## 2015-03-15 MED ORDER — QUETIAPINE FUMARATE 25 MG PO TABS: ORAL_TABLET | ORAL | Status: DC

## 2015-03-15 MED ORDER — HYDROCODONE-ACETAMINOPHEN 5-325 MG PO TABS: 1.0000 | ORAL_TABLET | Freq: Four times a day (QID) | ORAL | Status: DC | PRN

## 2015-03-15 NOTE — Progress Notes (Signed)
   Subjective:    Patient ID: [name], female    DOB: 20-Jun-1935, 80 y.o.   MRN: [id number]  Hypertension This is a chronic problem. The current episode started more than 1 year ago. Risk factors for coronary artery disease include dyslipidemia and post-menopausal state. Treatments tried: vasotec.   claims compliance with blood pressure medicine challenges on medicine does.    Patient with c/o right side of neck and back pain and left knee pain- can't sleep due to pain. Worse with turning neck back and forth. Tylenol helped some but not enough occasionally needs something stronger than tramadol  Claims compliance with lipid medication. Watching fat in diet. Does not miss a dose meds. No obvious side effect.     Review of Systems No headache no chest pain no shortness breath no back pain abdominal pain no change in bowel habits    Objective:   Physical Exam Alert vitals stable no acute distress neck some tenderness with rotation lungs clear no crackles heart rare rhythm blood pressure good on repeat ankles edema knee slight crepitation some pain with extension no joint laxity       Assessment & Plan:  Impression muscle skeletal neck and knee pain discussed #2 hyperlipidemia status uncertain #3 hypertension good control. Plan flu shot today. Maintain same medications. Appropriate blood work. Add hydrocodone when necessary. Symptom care discussed. Of note patient on Aricept and asked multiple questions over and over again during exam WSL follow-up as scheduled

## 2015-03-27 ENCOUNTER — Other Ambulatory Visit: Payer: Self-pay | Admitting: Family Medicine

## 2015-03-29 NOTE — Telephone Encounter (Signed)
Last seen 03/15/15

## 2015-03-29 NOTE — Telephone Encounter (Signed)
Ok six mo worth 

## 2015-04-05 ENCOUNTER — Encounter (HOSPITAL_COMMUNITY): Payer: Medicare Other | Attending: Hematology & Oncology

## 2015-04-05 ENCOUNTER — Encounter (HOSPITAL_COMMUNITY): Payer: Medicare Other | Attending: Hematology and Oncology | Admitting: Hematology & Oncology

## 2015-04-05 ENCOUNTER — Encounter (HOSPITAL_COMMUNITY): Payer: Self-pay | Admitting: Hematology & Oncology

## 2015-04-05 VITALS — BP 129/72 | HR 87 | Temp 99.1°F | Resp 18 | Wt 165.4 lb

## 2015-04-05 DIAGNOSIS — C3492 Malignant neoplasm of unspecified part of left bronchus or lung: Secondary | ICD-10-CM | POA: Insufficient documentation

## 2015-04-05 DIAGNOSIS — D649 Anemia, unspecified: Secondary | ICD-10-CM

## 2015-04-05 DIAGNOSIS — F329 Major depressive disorder, single episode, unspecified: Secondary | ICD-10-CM | POA: Diagnosis not present

## 2015-04-05 DIAGNOSIS — R079 Chest pain, unspecified: Secondary | ICD-10-CM | POA: Diagnosis not present

## 2015-04-05 DIAGNOSIS — M545 Low back pain: Secondary | ICD-10-CM | POA: Diagnosis not present

## 2015-04-05 DIAGNOSIS — C3490 Malignant neoplasm of unspecified part of unspecified bronchus or lung: Secondary | ICD-10-CM

## 2015-04-05 DIAGNOSIS — Z452 Encounter for adjustment and management of vascular access device: Secondary | ICD-10-CM

## 2015-04-05 DIAGNOSIS — C3432 Malignant neoplasm of lower lobe, left bronchus or lung: Secondary | ICD-10-CM

## 2015-04-05 DIAGNOSIS — Z95828 Presence of other vascular implants and grafts: Secondary | ICD-10-CM

## 2015-04-05 DIAGNOSIS — M25511 Pain in right shoulder: Secondary | ICD-10-CM | POA: Diagnosis not present

## 2015-04-05 LAB — CBC WITH DIFFERENTIAL/PLATELET
BASOS ABS: 0.1 10*3/uL (ref 0.0–0.1)
Basophils Relative: 1 %
EOS PCT: 22 %
Eosinophils Absolute: 1.2 10*3/uL — ABNORMAL HIGH (ref 0.0–0.7)
HCT: 31.9 % — ABNORMAL LOW (ref 36.0–46.0)
Hemoglobin: 10.5 g/dL — ABNORMAL LOW (ref 12.0–15.0)
LYMPHS PCT: 21 %
Lymphs Abs: 1.1 10*3/uL (ref 0.7–4.0)
MCH: 33.7 pg (ref 26.0–34.0)
MCHC: 32.9 g/dL (ref 30.0–36.0)
MCV: 102.2 fL — AB (ref 78.0–100.0)
MONO ABS: 0.6 10*3/uL (ref 0.1–1.0)
MONOS PCT: 11 %
Neutro Abs: 2.4 10*3/uL (ref 1.7–7.7)
Neutrophils Relative %: 45 %
PLATELETS: 305 10*3/uL (ref 150–400)
RBC: 3.12 MIL/uL — ABNORMAL LOW (ref 3.87–5.11)
RDW: 14.5 % (ref 11.5–15.5)
WBC: 5.3 10*3/uL (ref 4.0–10.5)

## 2015-04-05 LAB — COMPREHENSIVE METABOLIC PANEL
ALT: 8 U/L — ABNORMAL LOW (ref 14–54)
ANION GAP: 10 (ref 5–15)
AST: 28 U/L (ref 15–41)
Albumin: 3.7 g/dL (ref 3.5–5.0)
Alkaline Phosphatase: 33 U/L — ABNORMAL LOW (ref 38–126)
BUN: 14 mg/dL (ref 6–20)
CHLORIDE: 98 mmol/L — AB (ref 101–111)
CO2: 28 mmol/L (ref 22–32)
Calcium: 8.6 mg/dL — ABNORMAL LOW (ref 8.9–10.3)
Creatinine, Ser: 1.57 mg/dL — ABNORMAL HIGH (ref 0.44–1.00)
GFR, EST AFRICAN AMERICAN: 35 mL/min — AB (ref >60)
GFR, EST NON AFRICAN AMERICAN: 30 mL/min — AB (ref >60)
Glucose, Bld: 90 mg/dL (ref 65–99)
POTASSIUM: 3.1 mmol/L — AB (ref 3.5–5.1)
Sodium: 136 mmol/L (ref 135–145)
TOTAL PROTEIN: 6.4 g/dL — AB (ref 6.5–8.1)
Total Bilirubin: 0.8 mg/dL (ref 0.3–1.2)

## 2015-04-05 MED ORDER — HEPARIN SOD (PORK) LOCK FLUSH 100 UNIT/ML IV SOLN
500.0000 [IU] | Freq: Once | INTRAVENOUS | Status: AC
  Administered 2015-04-05: 500 [IU] via INTRAVENOUS

## 2015-04-05 MED ORDER — SODIUM CHLORIDE 0.9 % IJ SOLN
10.0000 mL | INTRAMUSCULAR | Status: DC | PRN
  Administered 2015-04-05: 10 mL via INTRAVENOUS
  Filled 2015-04-05: qty 10

## 2015-04-05 MED ORDER — HEPARIN SOD (PORK) LOCK FLUSH 100 UNIT/ML IV SOLN
INTRAVENOUS | Status: AC
Start: 2015-04-05 — End: 2015-04-05
  Filled 2015-04-05: qty 5

## 2015-04-05 NOTE — Progress Notes (Signed)
See office visit encounter. 

## 2015-04-05 NOTE — Progress Notes (Signed)
..  [name] presented for Portacath access and flush.   Portacath located rt chest wall accessed with  H 20 needle.  Good blood return present. Portacath flushed with 14m NS and 500U/576mHeparin and needle removed intact.  Procedure tolerated well and without incident.

## 2015-04-05 NOTE — Patient Instructions (Signed)
[place] at [facility] Discharge Instructions  RECOMMENDATIONS MADE BY THE CONSULTANT AND ANY TEST RESULTS WILL BE SENT TO YOUR REFERRING PHYSICIAN.  Exam and discussion by Dr. Whitney Muse. Will get CT scan of your chest. Port flushes as scheduled Call with any concerns   Follow-up in 3 months.   Thank you for choosing [place] at [facility] to provide your oncology and hematology care.  To afford each patient quality time with our provider, please arrive at least 15 minutes before your scheduled appointment time.    You need to re-schedule your appointment should you arrive 10 or more minutes late.  We strive to give you quality time with our providers, and arriving late affects you and other patients whose appointments are after yours.  Also, if you no show three or more times for appointments you may be dismissed from the clinic at the providers discretion.     Again, thank you for choosing [facility]  Our hope is that these requests will decrease the amount of time that you wait before being seen by our physicians.       _____________________________________________________________  Should you have questions after your visit to [name], please contact our office at [phone] between the hours of 8:30 a.m. and 4:30 p.m.  Voicemails left after 4:30 p.m. will not be returned until the following business day.  For prescription refill requests, have your pharmacy contact our office.

## 2015-04-05 NOTE — Progress Notes (Signed)
Mickie Hillier, MD [address] 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   Ms. Kapler is accompanied by her daughter and not feeling better. The patient presents in a wheelchair with a nasal cannula in place. She still feels weak and tired. She does not eat a lot of food. She has been drinking more, but it makes her feel bloated which she does not like. Her daughter says her mother has not worsened but also has not gotten any better.  Her hand tremors are chronic. Her mother and father both had hand tremors as well. She reports a runny nose. We discussed that this is due to her oxygen/nasal cannula.  She has experienced nausea and dizziness. She has zofran but it does not help. Her daughter believes the nausea may be due to her decreased eating with the medications she is taking.  Her daughter reports an excruciating pain that travels from her shoulder to her shoulder blade and radiates into her neck. The patient says it is not all over. She is able to lift her arms above her head. She has had to take pain medication to alleviate it.  She walks at home without any assistance. She has a walker at home but does not use it.     Squamous cell carcinoma of left lung ([place])   06/30/2013 Imaging CT of chest demonstrates 4 cm left lower lobe lesion with other concerning findings for malignancy   07/09/2013 Imaging PET- 4.1 cm left lower oobe lesion with left hilar and bilateral mediastinal lymphadeopathy.  Hepatic lesion not  confidently identified.  1.8 cm nodule in left upper lobe, cannot exclude low grade tumor.   07/16/2013 Initial Diagnosis Squamous cell carcinoma of left lung via CT-guided biopsy by IR   07/29/2013 Surgery IR placement of port-a-cath in preparation for concomittant chemoradiation    08/08/2013 - 09/12/2013 Chemotherapy Paclitaxel/Carboplatin weekly x 6   08/08/2013 - 09/19/2013 Radiation Therapy    10/14/2013 Imaging CT CAP- 3.2 cm posterior left lower lobe cavitary mass, corresponding to known primary bronchogenic neoplasm, improved. Small mediastinal nodes measuring up to 8 mm. Two hypodense hepatic lesions measuring up to 12 mm, poorly evaluated.   10/24/2013 Imaging MRI abd- Two liver lesions in the superior right hepatic lobe and left hepatic lobe remain indeterminate technically, but favor small benign hemangiomas over metastatic lesions.   02/02/2014 Imaging CT CAP- Decreasing size of the left lower lobe mass since prior study. 4 mm right lower lobe subpleural nodule, not seen on prior study.  Slight enlargement of hypodensities within the liver concerning for metastases.   05/26/2014 Imaging CT CAP- Continued evolution of postradiation treatment related changes in the left lung without definite signs of residual or recurrent disease, and no definite evidence of metastatic disease in the chest, abdomen or pelvis on today's examination   07/16/2014 - 07/17/2014 Hospital Admission Acute on chronic renal failure with orthostatic hypotension.  Negative CT of head.     07/16/2014 Imaging CT head wo contrast- No evidence of intracranial metastatic disease noted on the present unenhanced head CT.  08/27/2014 Remission CT CAP- Stable appearance of the chest without definite signs of residual or recurrent disease and no evidence for metastatic disease in the chest, abdomen or pelvis.    Past Medical History  Diagnosis Date  . Hypertension   . Hypothyroidism   . Hyperlipidemia   . Insomnia   . GERD  (gastroesophageal reflux disease)     chronic gastritis  . Adrenal adenoma   . Chronic diarrhea   . Fatty liver   . Arthritis     knee  . QT prolongation     syncope  . Coronary artery disease   . Impaired fasting glucose   . Renal insufficiency     low protien diet  . Tobacco use     1/2 ppd, approx 25-50 pack years (as of 08/2012)  . Family history of heart disease   . IBS (irritable bowel syndrome)   . Peripheral arterial disease ([place])     sstatus post  infrarenal abdominal aortic tube graft placed by Dr. Victorino Dike February 2008  . Cancer ([place])     Lung  . Squamous cell carcinoma of left lung ([place]) 07/29/2013  . HCAP (healthcare-associated pneumonia)     10/2014  . Acute respiratory failure ([place])     10/2014  . COPD (chronic obstructive pulmonary disease) ([place])     moderat  . Pleural effusion     has Unspecified hypothyroidism; Pruritic dermatitis; Chronic renal insufficiency; Generalized anxiety disorder; Insomnia; Esophageal reflux; Coronary artery disease; Essential hypertension; Hyperlipidemia; Peripheral arterial disease ([place]); Anemia, iron deficiency; Squamous cell carcinoma of left lung ([place]); AKI (acute kidney injury) ([place]); COPD, moderate ([place]); Cholelithiasis; Near syncope; Syncope; Orthostatic hypotension; Loose stools; Failure to thrive in adult; Pneumonia; HCAP (healthcare-associated pneumonia); Hypoxia; Healthcare-associated pneumonia; SOB (shortness of breath); Acute respiratory failure with hypoxia ([place]); Pleural effusion; Hyperglycemia; Dementia with behavioral disturbance; Moderate dementia without behavioral disturbance; Malignant neoplasm of lower lobe of left lung ([place]); Depression; and Other specified hypothyroidism on her problem list.     is allergic to aleve; dilaudid; dyazide; and zithromax.  We administered heparin lock flush and sodium chloride.  Past Surgical History  Procedure Laterality Date  . Abdominal hysterectomy    . Thyroidectomy, partial      . Colonoscopy  5/04    normal  . Abdominal aortic aneurysm repair  07/2006    D. J.D. Kellie Simmering  . Cataract extraction Bilateral 2010  . Cardiac catheterization  02/2010    mod CAD in L-dominant system with normal LV function  . Colon resection  2005    1/2 colon removed  . Transthoracic echocardiogram  02/2010    EF 55-60%; mild conc LVH, normal systolic function; mildly calcified AV annulus  . Colonoscopy with esophagogastroduodenoscopy (egd) N/A 03/19/2013    Procedure: COLONOSCOPY WITH ESOPHAGOGASTRODUODENOSCOPY (EGD);  Surgeon: Rogene Houston, MD;  Location: [facility];  Service: Endoscopy;  Laterality: N/A;  145  . Dg biopsy lung Left Jan 2015    Positive for malaise/fatigue. Positive for depression. Positive for runny nose. Positive for tremors.    Chronic hand tremors. Denies any headaches, double vision, fevers, chills, night sweats, nausea, vomiting, diarrhea, constipation, chest pain, heart palpitations, shortness of breath, blood in stool, black tarry stool, urinary pain, urinary burning, urinary frequency, hematuria. She has an oxygen tank with a nasal cannula in place. 14 point review of systems was performed and is negative except as detailed under history of present illness and above    PHYSICAL EXAMINATION  ECOG PERFORMANCE STATUS: 2 - Symptomatic, <50% confined to bed  Filed Vitals:   04/05/15 1349  BP: 129/72  Pulse: 87  Temp: 99.1 F (37.3 C)  Resp: 18    GENERAL:alert, no distress, well nourished, well developed, comfortable, cooperative. In a wheelchair. Nasal cannula in place. Able to get on exam table with assistance. SKIN: skin color, texture, turgor are normal, no rashes or significant lesions HEAD: Normocephalic, No masses, lesions, tenderness or abnormalities EYES: normal, PERRLA, EOMI, Conjunctiva are pink and non-injected EARS: External ears normal OROPHARYNX: lips, buccal mucosa, and tongue normal and mucous membranes are moist  NECK: supple,  trachea midline LYMPH:  not examined BREAST:not examined LUNGS: Decreased breath sounds in the left lower lung. Otherwise clear HEART: S1/S2 audible, regular ABDOMEN:abdomen soft and normal bowel sounds, exam limited secondary to patient sitting in wheelchair BACK: Back symmetric, no curvature. EXTREMITIES:less then 2 second capillary refill, no joint deformities, effusion, or inflammation, no cyanosis  NEURO: alert & oriented x 3 with fluent speech, no focal motor/sensory deficits, in a wheelchair, mini-mental status exam is negative.   LABORATORY DATA: I have reviewed the labs below.  Results for [place], [facility] (MRN [id number])   Ref. Range 04/05/2015 13:30  Sodium Latest Ref Range: 135-145 mmol/L 136  Potassium Latest Ref Range: 3.5-5.1 mmol/L 3.1 (L)  Chloride Latest Ref Range: 101-111 mmol/L 98 (L)  CO2 Latest Ref Range: 22-32 mmol/L 28  BUN Latest Ref Range: 6-20 mg/dL 14  Creatinine Latest Ref Range: 0.44-1.00 mg/dL 1.57 (H)  Calcium Latest Ref Range: 8.9-10.3 mg/dL 8.6 (L)  EGFR (Non-African Amer.) Latest Ref Range: >60 mL/min 30 (L)  EGFR (African American) Latest Ref Range: >60 mL/min 35 (L)  Glucose Latest Ref Range: 65-99 mg/dL 90  Anion gap Latest Ref Range: 5-15  10  Alkaline Phosphatase Latest Ref Range: 38-126 U/L 33 (L)  Albumin Latest Ref Range: 3.5-5.0 g/dL 3.7  AST Latest Ref Range: 15-41 U/L 28  ALT Latest Ref Range: 14-54 U/L 8 (L)  Total Protein Latest Ref Range: 6.5-8.1 g/dL 6.4 (L)  Total Bilirubin Latest Ref Range: 0.3-1.2 mg/dL 0.8  WBC Latest Ref Range: 4.0-10.5 K/uL 5.3  RBC Latest Ref Range: 3.87-5.11 MIL/uL 3.12 (L)  Hemoglobin Latest Ref Range: 12.0-15.0 g/dL 10.5 (L)  HCT Latest Ref Range: 36.0-46.0 % 31.9 (L)  MCV Latest Ref Range: 78.0-100.0 fL 102.2 (H)  MCH Latest Ref Range: 26.0-34.0 pg 33.7  MCHC Latest Ref Range: 30.0-36.0 g/dL 32.9  RDW Latest Ref Range: 11.5-15.5 % 14.5  Platelets Latest Ref Range: 150-400 K/uL 305  Neutrophils Latest  Units: % 45  Lymphocytes Latest Units: % 21  Monocytes Relative Latest Units: % 11  Eosinophil Latest Units: % 22  Basophil Latest Units: % 1  NEUT# Latest Ref Range: 1.7-7.7 K/uL 2.4  Lymphocyte # Latest Ref Range: 0.7-4.0 K/uL 1.1  Monocyte # Latest Ref Range: 0.1-1.0 K/uL 0.6  Eosinophils Absolute Latest Ref Range: 0.0-0.7 K/uL 1.2 (H)  Basophils Absolute Latest Ref Range: 0.0-0.1 K/uL 0.1     RADIOLOGY: CLINICAL DATA: Lung cancer  EXAM: CT CHEST WITHOUT CONTRAST  TECHNIQUE: Multidetector CT imaging of the chest was performed following the standard protocol without IV contrast.  COMPARISON: 11/19/2014  FINDINGS: Mediastinum: The heart size is mildly enlarged. Aortic atherosclerosis noted. Calcifications within the LAD and left circumflex coronary artery noted. No enlarged mediastinal or hilar lymph nodes.  Lungs/Pleura: There is no pleural effusion. Paramediastinal radiation changes identified within the left lung. Compared with previous exam the left  lower lobe consolidation is stable to improved in the interval. The previously noted left pleural effusion has nearly completely resolved in the interval. Moderate changes of centrilobular and paraseptal emphysema identified. No new or enlarging pulmonary nodules or masses identified.  Upper Abdomen: Low-attenuation structure within the left hepatic lobe measures 2.3 cm, image 52 of series 2. Unchanged from previous exam. There is a stable low density structure within segment 7 of the liver measuring 1.3 cm, image 47/series 2. The nodule in the left adrenal gland measures 2.5 cm and is stable from the previous examination. The right adrenal gland appears normal.  Musculoskeletal: There is no aggressive lytic or sclerotic bone lesion identified.  IMPRESSION: 1. Paramediastinal radiation change within the left lung is stable to improved in the interval. Currently there are no specific features identified to  suggest residual tumor or progression of disease. 2. Resolution of left pleural effusion. 3. Emphysema 4. Aortic atherosclerosis 5. Stable low-density structures within the liver.   Electronically Signed  By: Kerby Moors M.D.  On: 01/07/2015 13:02  ASSESSMENT AND PLAN:  Locally Advanced Squamous Cell Carcinoma of [place] [facility] admission secondary to CAP Depression Limited PS Anemia  Given her complaints of significant right upper chest, right shoulder and right upper back pain and her history of malignancy I have recommended repeating her CT scans of the chest. Her daughter notes that the pain is quite significant.  The patient realistically is otherwise at her prior baseline, she has certainly not made any significant improvement with therapy. I will see her back after imaging. Additional recommendations will be made at that time.  She has anemia which I feel is most likely secondary to chronic kidney disease. I am not sure if she follows with a nephrologist but that should certainly be considered. She has not had a bone marrow biopsy but I do not feel that is currently warranted. She has had a general anemia evaluation in the past with B12, folate and iron levels.  All questions were answered. The patient knows to call the clinic with any problems, questions or concerns. We can certainly see the patient much sooner if necessary.   This document serves as a record of services personally performed by Ancil Linsey, MD. It was created on her behalf by Arlyce Harman, a trained medical scribe. The creation of this record is based on the scribe's personal observations and the provider's statements to them. This document has been checked and approved by the attending provider.  I have reviewed the above documentation for accuracy and completeness, and I agree with the above.  This note was electronically signed.  Kelby Fam. Whitney Muse, MD

## 2015-04-07 ENCOUNTER — Other Ambulatory Visit (HOSPITAL_COMMUNITY): Payer: Self-pay | Admitting: Oncology

## 2015-04-07 ENCOUNTER — Other Ambulatory Visit: Payer: Self-pay | Admitting: Hematology

## 2015-04-07 DIAGNOSIS — E876 Hypokalemia: Secondary | ICD-10-CM

## 2015-04-07 MED ORDER — POTASSIUM CHLORIDE ER 20 MEQ PO TBCR: 20.0000 meq | EXTENDED_RELEASE_TABLET | Freq: Every day | ORAL | Status: DC

## 2015-04-08 ENCOUNTER — Telehealth (HOSPITAL_COMMUNITY): Payer: Self-pay | Admitting: *Deleted

## 2015-04-08 ENCOUNTER — Telehealth: Payer: Self-pay | Admitting: Family Medicine

## 2015-04-08 MED ORDER — POTASSIUM CHLORIDE ER 10 MEQ PO TBCR: EXTENDED_RELEASE_TABLET | ORAL | Status: DC

## 2015-04-08 NOTE — Telephone Encounter (Signed)
Notified Mardene Celeste med was sent to pharmacy.

## 2015-04-08 NOTE — Telephone Encounter (Signed)
klor con ten numb sixty two po qd 11 ref

## 2015-04-08 NOTE — Telephone Encounter (Signed)
This was filled today by Dr. Wolfgang Phoenix.  KEFALAS,THOMAS, PA-C 04/08/2015 5:09 PM

## 2015-04-08 NOTE — Telephone Encounter (Signed)
Pt had labs done by the cancer center and they are wanting her to be put on potassium. Pt's daughter called wanting to know if a prescription for it can be sent in.   [facility] [employer] 

## 2015-04-11 ENCOUNTER — Other Ambulatory Visit: Payer: Self-pay | Admitting: Family Medicine

## 2015-04-13 ENCOUNTER — Ambulatory Visit (HOSPITAL_COMMUNITY)
Admission: RE | Admit: 2015-04-13 | Discharge: 2015-04-13 | Disposition: A | Discharge status: Home / Self Care | Payer: Medicare Other | Source: Ambulatory Visit | Attending: Hematology & Oncology | Admitting: Hematology & Oncology

## 2015-04-13 DIAGNOSIS — K769 Liver disease, unspecified: Secondary | ICD-10-CM | POA: Diagnosis not present

## 2015-04-13 DIAGNOSIS — E278 Other specified disorders of adrenal gland: Secondary | ICD-10-CM | POA: Insufficient documentation

## 2015-04-13 DIAGNOSIS — Z08 Encounter for follow-up examination after completed treatment for malignant neoplasm: Secondary | ICD-10-CM | POA: Diagnosis not present

## 2015-04-13 DIAGNOSIS — J439 Emphysema, unspecified: Secondary | ICD-10-CM | POA: Diagnosis not present

## 2015-04-13 DIAGNOSIS — C3490 Malignant neoplasm of unspecified part of unspecified bronchus or lung: Secondary | ICD-10-CM | POA: Diagnosis not present

## 2015-04-13 MED ORDER — IOHEXOL 300 MG/ML  SOLN
75.0000 mL | Freq: Once | INTRAMUSCULAR | Status: AC | PRN
  Administered 2015-04-13: 60 mL via INTRAVENOUS

## 2015-04-15 ENCOUNTER — Telehealth: Payer: Self-pay | Admitting: Family Medicine

## 2015-04-15 MED ORDER — HYDROCODONE-ACETAMINOPHEN 5-325 MG PO TABS: ORAL_TABLET | ORAL | Status: DC

## 2015-04-15 NOTE — Telephone Encounter (Signed)
Pt is needing a refill on her hydrocodone.

## 2015-04-15 NOTE — Telephone Encounter (Signed)
Daughter also wants to know if the dosage can be increased. Pt is complaining of more and more pain and they are having to alternate with tramadol.

## 2015-04-15 NOTE — Telephone Encounter (Signed)
Called patient's daughter and informed her per Dr.Steve Luking-Since getting just thirty per mo, increase to sixty per month one up to bid prn plus two written ref. Informed daughter prescriptions were ready for pick up. Patient's daughter verbalized understanding.

## 2015-04-15 NOTE — Telephone Encounter (Signed)
Since getting just thirty per mo, incr to sixty per mo one up to bid prn plus two written ref

## 2015-04-20 ENCOUNTER — Other Ambulatory Visit: Payer: Self-pay | Admitting: Family Medicine

## 2015-04-21 ENCOUNTER — Other Ambulatory Visit: Payer: Self-pay | Admitting: Family Medicine

## 2015-04-22 ENCOUNTER — Other Ambulatory Visit: Payer: Self-pay | Admitting: Family Medicine

## 2015-04-23 ENCOUNTER — Telehealth: Payer: Self-pay | Admitting: Family Medicine

## 2015-04-23 NOTE — Telephone Encounter (Signed)
Please take care of this. thanks

## 2015-04-23 NOTE — Telephone Encounter (Signed)
tiZANidine (ZANAFLEX) 4 MG tablet  pts family accidentally reordered this med an want [place] to remove  It from her med list please

## 2015-04-23 NOTE — Telephone Encounter (Signed)
Medication removed from patient medication list per family's request, per [facility]

## 2015-04-24 ENCOUNTER — Other Ambulatory Visit: Payer: Self-pay | Admitting: Family Medicine

## 2015-05-03 ENCOUNTER — Other Ambulatory Visit: Payer: Self-pay | Admitting: Family Medicine

## 2015-05-10 ENCOUNTER — Other Ambulatory Visit: Payer: Self-pay | Admitting: Family Medicine

## 2015-05-24 ENCOUNTER — Other Ambulatory Visit: Payer: Self-pay | Admitting: Family Medicine

## 2015-05-25 ENCOUNTER — Other Ambulatory Visit: Payer: Self-pay | Admitting: Family Medicine

## 2015-05-26 NOTE — Telephone Encounter (Signed)
May have this into refills will need office visit within the next 3 months

## 2015-05-30 ENCOUNTER — Other Ambulatory Visit: Payer: Self-pay | Admitting: Family Medicine

## 2015-06-01 ENCOUNTER — Encounter (HOSPITAL_COMMUNITY): Payer: Self-pay

## 2015-06-01 ENCOUNTER — Encounter (HOSPITAL_COMMUNITY): Payer: Medicare Other | Attending: Hematology and Oncology

## 2015-06-01 DIAGNOSIS — C3432 Malignant neoplasm of lower lobe, left bronchus or lung: Secondary | ICD-10-CM | POA: Diagnosis not present

## 2015-06-01 DIAGNOSIS — Z452 Encounter for adjustment and management of vascular access device: Secondary | ICD-10-CM

## 2015-06-01 DIAGNOSIS — C3492 Malignant neoplasm of unspecified part of left bronchus or lung: Secondary | ICD-10-CM | POA: Insufficient documentation

## 2015-06-01 MED ORDER — HEPARIN SOD (PORK) LOCK FLUSH 100 UNIT/ML IV SOLN
500.0000 [IU] | Freq: Once | INTRAVENOUS | Status: AC
  Administered 2015-06-01: 500 [IU] via INTRAVENOUS

## 2015-06-01 MED ORDER — SODIUM CHLORIDE 0.9 % IJ SOLN
10.0000 mL | INTRAMUSCULAR | Status: DC | PRN
  Administered 2015-06-01: 10 mL via INTRAVENOUS
  Filled 2015-06-01: qty 10

## 2015-06-01 MED ORDER — HEPARIN SOD (PORK) LOCK FLUSH 100 UNIT/ML IV SOLN: 500.0000 [IU] | Freq: Once | INTRAVENOUS | Status: DC

## 2015-06-01 MED ORDER — HEPARIN SOD (PORK) LOCK FLUSH 100 UNIT/ML IV SOLN
INTRAVENOUS | Status: AC
  Filled 2015-06-01: qty 5

## 2015-06-01 MED ORDER — SODIUM CHLORIDE 0.9 % IJ SOLN: 10.0000 mL | INTRAMUSCULAR | Status: DC | PRN

## 2015-06-01 NOTE — Patient Instructions (Signed)
[place] at [facility] Discharge Instructions  RECOMMENDATIONS MADE BY THE CONSULTANT AND ANY TEST RESULTS WILL BE SENT TO YOUR REFERRING PHYSICIAN.  Port flush today. Return as scheduled for office visit and port flush.   Thank you for choosing [place] at [facility] to provide your oncology and hematology care.  To afford each patient quality time with our provider, please arrive at least 15 minutes before your scheduled appointment time.    You need to re-schedule your appointment should you arrive 10 or more minutes late.  We strive to give you quality time with our providers, and arriving late affects you and other patients whose appointments are after yours.  Also, if you no show three or more times for appointments you may be dismissed from the clinic at the providers discretion.     Again, thank you for choosing [facility].  Our hope is that these requests will decrease the amount of time that you wait before being seen by our physicians.       _____________________________________________________________  Should you have questions after your visit to [facility], please contact our office at [phone] between the hours of 8:30 a.m. and 4:30 p.m.  Voicemails left after 4:30 p.m. will not be returned until the following business day.  For prescription refill requests, have your pharmacy contact our office.

## 2015-06-01 NOTE — Progress Notes (Signed)
1300:  Meda Klinefelter presented for Portacath access and flush.  Portacath located right chest wall accessed with  H 20 needle.  Good blood return present. Portacath flushed with 75m NS and 500U/53mHeparin and needle removed intact.  Procedure tolerated well and without incident.

## 2015-06-14 ENCOUNTER — Encounter: Payer: Self-pay | Admitting: Family Medicine

## 2015-06-14 ENCOUNTER — Ambulatory Visit (INDEPENDENT_AMBULATORY_CARE_PROVIDER_SITE_OTHER): Payer: Medicare Other | Admitting: Family Medicine

## 2015-06-14 ENCOUNTER — Telehealth: Payer: Self-pay | Admitting: Family Medicine

## 2015-06-14 VITALS — BP 124/76 | Ht 67.0 in | Wt 152.0 lb

## 2015-06-14 DIAGNOSIS — I10 Essential (primary) hypertension: Secondary | ICD-10-CM

## 2015-06-14 MED ORDER — HYDROCODONE-ACETAMINOPHEN 5-325 MG PO TABS: ORAL_TABLET | ORAL | Status: DC

## 2015-06-14 MED ORDER — ALPRAZOLAM 1 MG PO TABS: ORAL_TABLET | ORAL | Status: DC

## 2015-06-14 MED ORDER — VITAMIN D (ERGOCALCIFEROL) 1.25 MG (50000 UNIT) PO CAPS: 50000.0000 [IU] | ORAL_CAPSULE | ORAL | Status: DC

## 2015-06-14 MED ORDER — AMOXICILLIN 500 MG PO CAPS: 500.0000 mg | ORAL_CAPSULE | Freq: Three times a day (TID) | ORAL | Status: DC

## 2015-06-14 MED ORDER — TRAMADOL HCL 50 MG PO TABS: ORAL_TABLET | ORAL | Status: DC

## 2015-06-14 NOTE — Progress Notes (Signed)
   Subjective:    Patient ID: Meda Klinefelter, female    DOB: 07/02/1934, 80 y.o.   MRN: [id number]  HPI patient arrives today with daughter Butch Penny.   This patient was seen today for chronic pain  The medication list was reviewed and updated.   -Compliance with medication: takes as prescribed.   - Number patient states they take daily: as needed. Some days does not take any.   -when was the last dose patient took? One half tablet one hour ago.   The patient was advised the importance of maintaining medication and not using illegal substances with these.  Refills needed: yes  The patient was educated that we can provide 3 monthly scripts for their medication, it is their responsibility to follow the instructions.  Side effects or complications from medications: none  Patient is aware that pain medications are meant to minimize the severity of the pain to allow their pain levels to improve to allow for better function. They are aware of that pain medications cannot totally remove their pain.  Due for UDT ( at least once per year) : The  Needs refill on hydrocodone, xanax, tramadol and vit d2 1.25 mg once weekly. orginally prescribed  By dr Merlene Laughter.   Low abd pain off and on for awhile and nausea.   Runny nose for awhile from using the oxygen.      Review of Systems     Objective:   Physical Exam        Assessment & Plan:

## 2015-06-14 NOTE — Telephone Encounter (Signed)
error 

## 2015-06-28 ENCOUNTER — Other Ambulatory Visit: Payer: Self-pay | Admitting: Family Medicine

## 2015-07-05 ENCOUNTER — Other Ambulatory Visit: Payer: Self-pay | Admitting: Family Medicine

## 2015-07-08 ENCOUNTER — Ambulatory Visit (HOSPITAL_COMMUNITY): Payer: Medicare Other | Admitting: Hematology & Oncology

## 2015-07-08 ENCOUNTER — Encounter (HOSPITAL_COMMUNITY): Payer: Medicare Other

## 2015-07-12 ENCOUNTER — Encounter (HOSPITAL_COMMUNITY): Payer: Medicare Other

## 2015-07-12 ENCOUNTER — Encounter (HOSPITAL_COMMUNITY): Payer: Self-pay | Admitting: Hematology & Oncology

## 2015-07-12 ENCOUNTER — Encounter (HOSPITAL_COMMUNITY): Payer: Medicare Other | Attending: Hematology and Oncology | Admitting: Hematology & Oncology

## 2015-07-12 VITALS — BP 114/67 | HR 80 | Temp 97.8°F | Resp 18 | Wt 144.5 lb

## 2015-07-12 DIAGNOSIS — C3432 Malignant neoplasm of lower lobe, left bronchus or lung: Secondary | ICD-10-CM

## 2015-07-12 DIAGNOSIS — F329 Major depressive disorder, single episode, unspecified: Secondary | ICD-10-CM

## 2015-07-12 DIAGNOSIS — D509 Iron deficiency anemia, unspecified: Secondary | ICD-10-CM

## 2015-07-12 DIAGNOSIS — N189 Chronic kidney disease, unspecified: Secondary | ICD-10-CM | POA: Diagnosis not present

## 2015-07-12 DIAGNOSIS — F0390 Unspecified dementia without behavioral disturbance: Secondary | ICD-10-CM

## 2015-07-12 DIAGNOSIS — K769 Liver disease, unspecified: Secondary | ICD-10-CM | POA: Diagnosis not present

## 2015-07-12 DIAGNOSIS — C3492 Malignant neoplasm of unspecified part of left bronchus or lung: Secondary | ICD-10-CM | POA: Insufficient documentation

## 2015-07-12 DIAGNOSIS — Z95828 Presence of other vascular implants and grafts: Secondary | ICD-10-CM

## 2015-07-12 MED ORDER — HEPARIN SOD (PORK) LOCK FLUSH 100 UNIT/ML IV SOLN
INTRAVENOUS | Status: AC
  Filled 2015-07-12: qty 5

## 2015-07-12 MED ORDER — SODIUM CHLORIDE 0.9 % IJ SOLN
10.0000 mL | Freq: Once | INTRAMUSCULAR | Status: AC
  Administered 2015-07-12: 10 mL via INTRAVENOUS

## 2015-07-12 MED ORDER — HEPARIN SOD (PORK) LOCK FLUSH 100 UNIT/ML IV SOLN
500.0000 [IU] | Freq: Once | INTRAVENOUS | Status: AC
  Administered 2015-07-12: 500 [IU] via INTRAVENOUS

## 2015-07-12 NOTE — Progress Notes (Signed)
Meda Klinefelter presented for Portacath access and flush.  Proper placement of portacath confirmed by CXR.  Portacath located right chest wall accessed with  H 20 needle.  No blood return and port flushes easily. Portacath flushed with 53m NS and 500U/517mHeparin and needle removed intact.  Procedure tolerated well and without incident.

## 2015-07-12 NOTE — Progress Notes (Signed)
Mickie Hillier, MD [address] 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   [name] returns to the [place] today with two daughters.  She confirms that, in terms of symptoms, everything is the same and that during the holidays she was in the bed asleep.  She confirms that she's not the same person she was before treatment, that she hasn't "bounced back" like some of her friends.  She confirms that she gets her pneumonia shots and flu vaccines, commenting "I've had everything." Her PCP Dr. Wolfgang Phoenix keeps up with all of her follow-up.  When climbing onto the exam table, she says "I don't know if I'll ever stop losing weight." Her family members confirm that she eats like a bird. She says "I eat all my stomach can hold." She says eating too much makes her feel worse; "I don't see the point."  She is able to ambulate to the exam table with assistance, but has a very abnormal gait. She denies any belly pain when her abdomen is palpated.  Her family members report that she has consulted with a neurologist about the possibility of dementia, and that she has another appointment in February. She is on namenda.    Her daughters take turns caring for her.   She says she has nightmares and wants to know if this is normal. She wants to know if patient's with dementia have pain when they die.   [name] believes she is a burden on her daughter and her family members that care for her, and her daughter reports that, as a  result of this, she does everything she can to push her family away.    Squamous cell carcinoma of left lung ([place])   06/30/2013 Imaging CT of chest demonstrates 4 cm left lower lobe lesion with other concerning findings for malignancy   07/09/2013 Imaging PET- 4.1 cm left lower oobe lesion with left hilar and bilateral mediastinal lymphadeopathy.  Hepatic lesion not confidently identified.  1.8 cm nodule in left upper lobe, cannot exclude low grade tumor.   07/16/2013 Initial Diagnosis Squamous cell carcinoma of left lung via CT-guided biopsy by IR   07/29/2013 Surgery IR placement of port-a-cath in preparation for concomittant chemoradiation    08/08/2013 - 09/12/2013 Chemotherapy Paclitaxel/Carboplatin weekly x 6   08/08/2013 - 09/19/2013 Radiation Therapy    10/14/2013 Imaging CT CAP- 3.2 cm posterior left lower lobe cavitary mass, corresponding to known primary bronchogenic neoplasm, improved. Small mediastinal nodes measuring up to 8 mm. Two hypodense hepatic lesions measuring up to 12 mm, poorly evaluated.   10/24/2013 Imaging MRI abd- Two liver lesions in the superior right hepatic lobe and left hepatic lobe remain indeterminate technically, but favor small benign hemangiomas over metastatic lesions.   02/02/2014 Imaging CT CAP- Decreasing size of the left lower lobe mass since prior study. 4 mm right lower lobe subpleural nodule, not seen on prior study.  Slight enlargement of hypodensities within the liver concerning for metastases.   05/26/2014 Imaging CT CAP- Continued evolution of postradiation treatment related changes in the left lung  without definite signs of residual or recurrent disease, and no definite evidence of metastatic disease in the chest, abdomen or pelvis on today's examination   07/16/2014 - 07/17/2014 Hospital Admission Acute on chronic renal failure with orthostatic hypotension.  Negative CT of head.     07/16/2014 Imaging CT head wo contrast- No evidence of intracranial metastatic disease  noted on the present unenhanced head CT.   08/27/2014 Remission CT CAP- Stable appearance of the chest without definite signs of residual or recurrent disease and no evidence for metastatic disease in the chest, abdomen or pelvis.   04/13/2015 Imaging CT chest with stable L sided paramediastinal radiation fibrosis without definite findings for recurrent tumor, no pulm metastatic disease, stable to slightly larger segment 8 liver lesion, segment 4A lesion not seen, benign adenoma    Past Medical History  Diagnosis Date  . Hypertension   . Hypothyroidism   . Hyperlipidemia   . Insomnia   . GERD (gastroesophageal reflux disease)     chronic gastritis  . Adrenal adenoma   . Chronic diarrhea   . Fatty liver   . Arthritis     knee  . QT prolongation     syncope  . Coronary artery disease   . Impaired fasting glucose   . Renal insufficiency     low protien diet  . Tobacco use     1/2 ppd, approx 25-50 pack years (as of 08/2012)  . Family history of heart disease   . IBS (irritable bowel syndrome)   . Peripheral arterial disease ([place])     sstatus post  infrarenal abdominal aortic tube graft placed by Dr. Victorino Dike February 2008  . Cancer ([place])     Lung  . Squamous cell carcinoma of left lung ([place]) 07/29/2013  . HCAP (healthcare-associated pneumonia)     10/2014  . Acute respiratory failure ([place])     10/2014  . COPD (chronic obstructive pulmonary disease) ([place])     moderat  . Pleural effusion     has Unspecified hypothyroidism; Pruritic dermatitis; Chronic renal insufficiency; Generalized anxiety disorder; Insomnia; Esophageal reflux; Coronary artery disease; Essential hypertension; Hyperlipidemia; Peripheral arterial disease ([place]); Anemia, iron deficiency; Squamous cell carcinoma of left lung ([place]); AKI (acute kidney injury) ([place]); COPD, moderate ([place]); Cholelithiasis; Near syncope; Syncope; Orthostatic hypotension; Loose stools; Failure to thrive in adult; Pneumonia; HCAP  (healthcare-associated pneumonia); Hypoxia; Healthcare-associated pneumonia; SOB (shortness of breath); Acute respiratory failure with hypoxia ([place]); Pleural effusion; Hyperglycemia; Dementia with behavioral disturbance; Moderate dementia without behavioral disturbance; Malignant neoplasm of lower lobe of left lung ([place]); Depression; and Other specified hypothyroidism on her problem list.     is allergic to aleve; dilaudid; dyazide; and zithromax.  [name] does not currently have medications on file.  Past Surgical History  Procedure Laterality Date  . Abdominal hysterectomy    . Thyroidectomy, partial    . Colonoscopy  5/04    normal  . Abdominal aortic aneurysm repair  07/2006    D. J.D. Kellie Simmering  . Cataract extraction Bilateral 2010  . Cardiac catheterization  02/2010    mod CAD in L-dominant system with normal LV function  . Colon resection  2005    1/2 colon removed  . Transthoracic echocardiogram  02/2010    EF 55-60%; mild conc LVH, normal systolic function; mildly calcified AV annulus  . Colonoscopy with esophagogastroduodenoscopy (egd) N/A 03/19/2013    Procedure: COLONOSCOPY WITH ESOPHAGOGASTRODUODENOSCOPY (EGD);  Surgeon: Rogene Houston, MD;  Location: [facility];  Service: Endoscopy;  Laterality: N/A;  145  . Dg biopsy lung Left Jan 2015    Positive for malaise/fatigue. Positive for depression. Positive for runny nose. Positive for tremors.    Chronic hand tremors. Denies any headaches, double vision, fevers, chills, night sweats, nausea, vomiting, diarrhea, constipation, chest pain, heart palpitations, shortness of breath, blood in stool, black tarry stool, urinary pain, urinary burning, urinary frequency, hematuria. She has an oxygen tank with a nasal cannula in place. 14 point review of systems was performed and is negative except as detailed under history of present illness and above  PHYSICAL EXAMINATION  ECOG PERFORMANCE STATUS: 2 - Symptomatic, <50% confined  to bed  Filed Vitals:   07/12/15 1034  BP: 114/67  Pulse: 80  Temp: 97.8 F (36.6 C)  Resp: 18    GENERAL:alert, no distress, well nourished, well developed, comfortable, cooperative. In a wheelchair. Nasal cannula in place. Able to get on exam table with assistance. SKIN: skin color, texture, turgor are normal, no rashes or significant lesions HEAD: Normocephalic, No masses, lesions, tenderness or abnormalities EYES: normal, PERRLA, EOMI, Conjunctiva are pink and non-injected EARS: External ears normal OROPHARYNX: lips, buccal mucosa, and tongue normal and mucous membranes are moist  NECK: supple, trachea midline LYMPH:  not examined BREAST:not examined LUNGS: Decreased breath sounds in the left lower lung. Otherwise clear HEART: S1/S2 audible, regular ABDOMEN:abdomen soft and normal bowel sounds, exam limited secondary to patient sitting in wheelchair BACK: Back symmetric, no curvature. EXTREMITIES:less then 2 second capillary refill, no joint deformities, effusion, or inflammation, no cyanosis  NEURO: oriented to person,  Slow gait, wandering in conversation at time, appropriate at others.   LABORATORY DATA: I have reviewed the labs below.  CBC    Component Value Date/Time   WBC 5.3 04/05/2015 1330   RBC 3.12* 04/05/2015 1330   HGB 10.5* 04/05/2015 1330   HCT 31.9* 04/05/2015 1330   PLT 305 04/05/2015 1330   MCV 102.2* 04/05/2015 1330   MCH 33.7 04/05/2015 1330   MCHC 32.9 04/05/2015 1330   RDW 14.5 04/05/2015 1330   LYMPHSABS 1.1 04/05/2015 1330   MONOABS 0.6 04/05/2015 1330   EOSABS 1.2* 04/05/2015 1330   BASOSABS 0.1 04/05/2015 1330   CMP     Component Value Date/Time   NA 136 04/05/2015 1330   K 3.1* 04/05/2015 1330   CL 98* 04/05/2015 1330   CO2 28 04/05/2015 1330   GLUCOSE 90 04/05/2015 1330   BUN 14 04/05/2015 1330   CREATININE 1.57* 04/05/2015 1330   CREATININE 2.07* 06/24/2014 1239   CALCIUM 8.6* 04/05/2015 1330   PROT 6.4* 04/05/2015 1330    ALBUMIN 3.7 04/05/2015 1330   AST 28 04/05/2015 1330   ALT 8* 04/05/2015 1330   ALKPHOS 33* 04/05/2015 1330   BILITOT 0.8 04/05/2015 1330   GFRNONAA 30* 04/05/2015 1330   GFRAA 35* 04/05/2015 1330    RADIOLOGY:   Study Result     CLINICAL DATA: Restaging lung cancer.  EXAM: CT CHEST WITH CONTRAST  TECHNIQUE: Multidetector CT imaging of the chest was performed during intravenous contrast administration.  CONTRAST: 38m OMNIPAQUE IOHEXOL 300 MG/ML SOLN  COMPARISON: 01/07/2015  FINDINGS: Mediastinum/Nodes: The right-sided Port-A-Cath is stable. No breast masses, supraclavicular or axillary lymphadenopathy.  The heart is upper limits of normal in size and stable. Persistent small pericardial effusion. The aorta is normal in caliber. Advanced atherosclerotic calcifications. No dissection. Stable advanced three-vessel coronary artery calcifications. No mediastinal or hilar mass or lymphadenopathy. The esophagus is  grossly normal.  Lungs/Pleura: Stable advanced emphysematous changes. Extensive left paramediastinal radiation changes. No definite CT findings to suggest recurrent tumor or metastatic pulmonary disease. There is a small recurrent left pleural effusion. No enhancing pleural nodules. The right lung remains relatively clear. Minimal dependent atelectasis.  Upper abdomen: The left hepatic lobe lesion is not well demonstrated on today's examination. The segment 8 lesion measures 18 x 12 mm on image number 49. Difficult to measure on the prior noncontrast study but it is estimated at 14.5 x 11.5 mm. No new hepatic lesions. Stable left adrenal gland lesion measuring 2.7 x 2.4 cm.  Musculoskeletal: No significant bony findings.  IMPRESSION: 1. Stable left-sided paramediastinal radiation fibrosis without definite findings for recurrent tumor. 2. No findings for pulmonary metastatic disease. 3. Stable emphysematous changes. 4. Stable to slightly  larger segment 8 liver lesion. The segment 4A lesion is no longer visualized for certain. 5. Stable left adrenal gland lesion, consistent with benign adenoma.   Electronically Signed  By: Marijo Sanes M.D.  On: 04/13/2015 14:43     CLINICAL DATA: Lung cancer  EXAM: CT CHEST WITHOUT CONTRAST  TECHNIQUE: Multidetector CT imaging of the chest was performed following the standard protocol without IV contrast.  COMPARISON: 11/19/2014  FINDINGS: Mediastinum: The heart size is mildly enlarged. Aortic atherosclerosis noted. Calcifications within the LAD and left circumflex coronary artery noted. No enlarged mediastinal or hilar lymph nodes.  Lungs/Pleura: There is no pleural effusion. Paramediastinal radiation changes identified within the left lung. Compared with previous exam the left lower lobe consolidation is stable to improved in the interval. The previously noted left pleural effusion has nearly completely resolved in the interval. Moderate changes of centrilobular and paraseptal emphysema identified. No new or enlarging pulmonary nodules or masses identified.  Upper Abdomen: Low-attenuation structure within the left hepatic lobe measures 2.3 cm, image 52 of series 2. Unchanged from previous exam. There is a stable low density structure within segment 7 of the liver measuring 1.3 cm, image 47/series 2. The nodule in the left adrenal gland measures 2.5 cm and is stable from the previous examination. The right adrenal gland appears normal.  Musculoskeletal: There is no aggressive lytic or sclerotic bone lesion identified.  IMPRESSION: 1. Paramediastinal radiation change within the left lung is stable to improved in the interval. Currently there are no specific features identified to suggest residual tumor or progression of disease. 2. Resolution of left pleural effusion. 3. Emphysema 4. Aortic atherosclerosis 5. Stable low-density structures within  the liver.   Electronically Signed  By: Kerby Moors M.D.  On: 01/07/2015 13:02  ASSESSMENT AND PLAN:  Locally Advanced Squamous Cell Carcinoma of [place] [facility] admission secondary to CAP Depression Limited PS Anemia, macrocytic CKD Dementia  Findings on imaging in the liver have been present since early 2015. The patient will be due for repeat imaging prior to next follow-up.  Her major health issues revolve around her dementia, she has follow up with neurology in February.  She has anemia which I feel is most likely secondary to chronic kidney disease. I am not sure if she follows with a nephrologist but that should certainly be considered. She has not had a bone marrow biopsy but I do not feel that is currently warranted. She has had a general anemia evaluation in the past with B12, folate and iron levels.  During the appointment, I advised her family members, as caregivers, to talk about the hard things and acknowledge the stress and burden of being caretakers. Their  main concerns are with regards to finances and continued caregiving for [name]. They have not addressed health care POA but have talked about it. She will return in 3 months. Sooner if needed.   Orders Placed This Encounter  Procedures  . CT Abdomen Pelvis W Contrast    Standing Status: Future     Number of Occurrences:      Standing Expiration Date: 07/11/2016    Order Specific Question:  If indicated for the ordered procedure, I authorize the administration of contrast media per Radiology protocol    Answer:  Yes    Order Specific Question:  Reason for Exam (SYMPTOM  OR DIAGNOSIS REQUIRED)    Answer:  F/U stage III lung cancer, liver lesion    Order Specific Question:  Preferred imaging location?    Answer:  [facility]  . CT Chest W Contrast    Standing Status: Future     Number of Occurrences:      Standing Expiration Date: 07/11/2016    Order Specific Question:  If indicated for the  ordered procedure, I authorize the administration of contrast media per Radiology protocol    Answer:  Yes    Order Specific Question:  Reason for Exam (SYMPTOM  OR DIAGNOSIS REQUIRED)    Answer:  F/U stage III lung cancer, liver lesion    Order Specific Question:  Preferred imaging location?    Answer:  [facility]   All questions were answered. The patient knows to call the clinic with any problems, questions or concerns. We can certainly see the patient much sooner if necessary.   This document serves as a record of services personally performed by Ancil Linsey, MD. It was created on her behalf by Toni Amend, a trained medical scribe. The creation of this record is based on the scribe's personal observations and the provider's statements to them. This document has been checked and approved by the attending provider.  I have reviewed the above documentation for accuracy and completeness, and I agree with the above.  This note was electronically signed.  Kelby Fam. Whitney Muse, MD

## 2015-07-12 NOTE — Progress Notes (Signed)
Please see doctors encounter for more information 

## 2015-07-12 NOTE — Patient Instructions (Addendum)
[place] at [facility] Discharge Instructions  RECOMMENDATIONS MADE BY THE CONSULTANT AND ANY TEST RESULTS WILL BE SENT TO YOUR REFERRING PHYSICIAN.     Exam completed by Dr Whitney Muse today. Port flush today. Port flush every 8 weeks. As far as lung cancer perspective, your scans look good You have some fibrosus, which is just scar tissue.  We will have Dr Thornton Papas look at the scans to see when we need to repeat the scans.  Return to see the doctor in 3 months. Please call the clinic if you have any questions or concerns     Thank you for choosing [place] at [facility] to provide your oncology and hematology care.  To afford each patient quality time with our provider, please arrive at least 15 minutes before your scheduled appointment time.    You need to re-schedule your appointment should you arrive 10 or more minutes late.  We strive to give you quality time with our providers, and arriving late affects you and other patients whose appointments are after yours.  Also, if you no show three or more times for appointments you may be dismissed from the clinic at the providers discretion.     Again, thank you for choosing [facility].  Our hope is that these requests will decrease the amount of time that you wait before being seen by our physicians.       _____________________________________________________________  Should you have questions after your visit to [facility], please contact our office at [phone] between the hours of 8:30 a.m. and 4:30 p.m.  Voicemails left after 4:30 p.m. will not be returned until the following business day.  For prescription refill requests, have your pharmacy contact our office.

## 2015-07-22 ENCOUNTER — Other Ambulatory Visit: Payer: Self-pay | Admitting: *Deleted

## 2015-07-22 ENCOUNTER — Telehealth: Payer: Self-pay | Admitting: Family Medicine

## 2015-07-22 MED ORDER — PANTOPRAZOLE SODIUM 40 MG PO TBEC: 40.0000 mg | DELAYED_RELEASE_TABLET | Freq: Every day | ORAL | Status: DC

## 2015-07-22 NOTE — Telephone Encounter (Signed)
Discussed with pt's daughter Mardene Celeste. Ok with change. Med sent to pharm.

## 2015-07-22 NOTE — Telephone Encounter (Signed)
Try protonix 40 qam one yrs worth

## 2015-07-22 NOTE — Telephone Encounter (Signed)
Pt's plan does not cover esomeprazole (NEXIUM) 40 MG capsule , patient must fail Omeproazole and Pantoprazole   Please see letter in green folder, please advise

## 2015-07-30 ENCOUNTER — Telehealth: Payer: Self-pay | Admitting: Family Medicine

## 2015-07-30 NOTE — Telephone Encounter (Signed)
Med list says folate '1mg'$  daily-historical provider

## 2015-07-30 NOTE — Telephone Encounter (Signed)
Patient needs Rx for folic acid.  This was prescribed by Dr. Merlene Laughter who she is not seeing anymore.   [place]

## 2015-07-31 ENCOUNTER — Other Ambulatory Visit: Payer: Self-pay | Admitting: Family Medicine

## 2015-08-02 ENCOUNTER — Encounter: Payer: Self-pay | Admitting: Neurology

## 2015-08-02 ENCOUNTER — Ambulatory Visit (INDEPENDENT_AMBULATORY_CARE_PROVIDER_SITE_OTHER): Payer: Medicare Other | Admitting: Neurology

## 2015-08-02 VITALS — BP 122/64 | HR 94

## 2015-08-02 DIAGNOSIS — F0390 Unspecified dementia without behavioral disturbance: Secondary | ICD-10-CM | POA: Diagnosis not present

## 2015-08-02 DIAGNOSIS — F03B0 Unspecified dementia, moderate, without behavioral disturbance, psychotic disturbance, mood disturbance, and anxiety: Secondary | ICD-10-CM

## 2015-08-02 DIAGNOSIS — F329 Major depressive disorder, single episode, unspecified: Secondary | ICD-10-CM | POA: Diagnosis not present

## 2015-08-02 DIAGNOSIS — F0391 Unspecified dementia with behavioral disturbance: Secondary | ICD-10-CM

## 2015-08-02 DIAGNOSIS — F32A Depression, unspecified: Secondary | ICD-10-CM

## 2015-08-02 DIAGNOSIS — F03B18 Unspecified dementia, moderate, with other behavioral disturbance: Secondary | ICD-10-CM

## 2015-08-02 MED ORDER — QUETIAPINE FUMARATE 25 MG PO TABS: 25.0000 mg | ORAL_TABLET | Freq: Every day | ORAL | Status: DC

## 2015-08-02 MED ORDER — DONEPEZIL HCL 10 MG PO TABS: ORAL_TABLET | ORAL | Status: DC

## 2015-08-02 NOTE — Progress Notes (Signed)
NEUROLOGY FOLLOW UP OFFICE NOTE  [name] [id number]  HISTORY OF PRESENT ILLNESS: I had the pleasure of seeing [name] in follow-up in the neurology clinic on 08/02/2015.  The patient was last seen 6 months ago for moderate dementia. MMSE at that time was 15/30. She is again accompanied by her daughter who helps supplement the history today.  On her last visit, Aricept dose was increased to '10mg'$  daily and she was given a starter pack for Namenda XR. Her daughter called our office reporting that on the second week of the starter pack, she was more confused, agitated, and angry. She would have periods where she is saying certain people are there when they are not. She did report it helped her rest better, and asked for something to help at night. She was given a prescription for Seroquel 12.'5mg'$  qhs, this was increased to '25mg'$  qhs by her PCP.  Her daughter reports that memory is stable, however there is a lot of anger, particularly at her daughter with her today. The patient gets agitated that family has to be with her at all times. They help her into the tub but she can bathe herself on the shower chair and dress herself. She ambulates without assistance and can make a sandwich for herself, but family does most of the cooking. Her family makes sure she takes her medications in the pillbox.   The patient reports feeling depressed and anxious. She reports that her memory is sometimes good, sometimes not so good. She forgets names and places. She denies any headaches, dizziness, diplopia, focal numbness/tingling/weakness, no falls. Her daughter has not noticed much hallucinations, but possibly some vivid nightmares.  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 She reports her memory is not as good as it  was, she occasionally forgets names and conversation and has occasional word-finding difficulties. She lives with her daughter, she started noticing cognitive changes 2 years ago while undergoing chemotherapy, which they attributed to "chemo brain." She would ask the same questions, or say a different word from what she meant to say. Over the past 6 months, cognition has been worsening. She has done things out of character, such as leaving things in places she normally would not have. She has worn a wristwatch daily as long as her daughter can remember, then could not recall where she left it. Since Christmas, family gave her a new watch, and she now wears this daily again. She has some difficulties following instructions, her daughter tells her to put her O2 nasal cannula on and she needs to think about it for a minute. She is "more mean" and agitated when she feels like she is being bossed around, there is a lot of depression from not being able to bounce back after the chemotherapy. Her daughter took over the bills 2 years ago when she was diagnosed with cancer. She stopped driving 2 years ago as well. She retired from working in [employer], educational attainment 7th grade.  She has headaches when more anxious, and reports a headache that started when she got to the office today. No nausea/vomiting/photo/phonophobia. She has dizziness when anxious. She has occasional blurred vision, but not as bad as when she was in the hospital last May for pneumonia. She denies any diplopia, dysarthria, dysphagia, neck pain. She has occasional right  foot tingling and pin on the dorsum of the right foot. She has chronic back pain and incontinence, using adult diapers at night. Family helps her with dressing but she is mostly able to do it herself. She had seen neurologist Dr. Merlene Laughter and was started on Aricept, she is on '5mg'$  daily without side effects. Her sister has memory issues from hypoxic injury, she denies any  significant head injuries or alcohol use.   I personally reviewed MRI brain with and without contrast done 09/2014 which did not show any acute changes, there was mild to moderate diffuse atrophy and chronic microvascular disease. No abnormal enhancement.  PAST MEDICAL HISTORY: Past Medical History  Diagnosis Date  . Hypertension   . Hypothyroidism   . Hyperlipidemia   . Insomnia   . GERD (gastroesophageal reflux disease)     chronic gastritis  . Adrenal adenoma   . Chronic diarrhea   . Fatty liver   . Arthritis     knee  . QT prolongation     syncope  . Coronary artery disease   . Impaired fasting glucose   . Renal insufficiency     low protien diet  . Tobacco use     1/2 ppd, approx 25-50 pack years (as of 08/2012)  . Family history of heart disease   . IBS (irritable bowel syndrome)   . Peripheral arterial disease ([place])     sstatus post  infrarenal abdominal aortic tube graft placed by Dr. Victorino Dike February 2008  . Cancer ([place])     Lung  . Squamous cell carcinoma of left lung ([place]) 07/29/2013  . HCAP (healthcare-associated pneumonia)     10/2014  . Acute respiratory failure ([place])     10/2014  . COPD (chronic obstructive pulmonary disease) ([place])     moderat  . Pleural effusion     MEDICATIONS: Current Outpatient Prescriptions on File Prior to Visit  Medication Sig Dispense Refill  . allopurinol (ZYLOPRIM) 300 MG tablet take 1 tablet by mouth once daily 30 tablet 5  . ALPRAZolam (XANAX) 1 MG tablet TAKE ONE-HALF TO ONE TABLET BY MOUTH THREE TIMES DAILY AS NEEDED FOR ANXIETY 90 tablet 5  . amLODipine (NORVASC) 5 MG tablet take 1 tablet by mouth once daily 30 tablet 5  . aspirin EC 81 MG tablet Take 81 mg by mouth daily.    Marland Kitchen donepezil (ARICEPT) 10 MG tablet Take 1 tablet daily 30 tablet 11  . DULoxetine (CYMBALTA) 60 MG capsule take 1 capsule by mouth once daily 30 capsule 2  . enalapril (VASOTEC) 10 MG tablet take 1 tablet by mouth once daily 30 tablet 5  . fenofibrate  160 MG tablet take 1 tablet by mouth once daily with food 30 tablet 5  . ferrous sulfate (FERROUSUL) 325 (65 FE) MG tablet Take 1 tablet (325 mg total) by mouth 2 (two) times daily after a meal.  3  . folic acid (FOLVITE) 1 MG tablet Take 1 mg by mouth daily.  1  . hydrALAZINE (APRESOLINE) 25 MG tablet take 1 tablet by mouth three times a day 90 tablet 5  . HYDROcodone-acetaminophen (NORCO/VICODIN) 5-325 MG tablet Take 1 tablet twice a day as needed for pain 60 tablet 0  . levothyroxine (SYNTHROID, LEVOTHROID) 137 MCG tablet take 1 tablet by mouth once daily 30 tablet 5  . pantoprazole (PROTONIX) 40 MG tablet Take 1 tablet (40 mg total) by mouth daily. 30 tablet 11  . potassium chloride (K-DUR) 10 MEQ tablet  Take 2 tabs daily 60 tablet 11  . pravastatin (PRAVACHOL) 20 MG tablet take 1 tablet by mouth once daily 30 tablet 5  . QUEtiapine (SEROQUEL) 25 MG tablet take 1 tablet at bedtime 30 tablet 5  . SPIRIVA HANDIHALER 18 MCG inhalation capsule Place 18 mcg into inhaler and inhale as needed.   0  . traMADol (ULTRAM) 50 MG tablet take 1 tablet by mouth every 6 hours if needed 60 tablet 2  . Vitamin D, Ergocalciferol, (DRISDOL) 50000 UNITS CAPS capsule Take 1 capsule (50,000 Units total) by mouth once a week. 30 capsule 11   No current facility-administered medications on file prior to visit.    ALLERGIES: Allergies  Allergen Reactions  . Aleve [Naproxen Sodium] Other (See Comments)    GI bleed  . Dilaudid [Hydromorphone Hcl]     Can not tolerate  . Dyazide [Hydrochlorothiazide W-Triamterene] Other (See Comments)    cramps  . Zithromax [Azithromycin] Itching    FAMILY HISTORY: Family History  Problem Relation Age of Onset  . Hypertension Mother   . Diabetes Mother   . Heart attack Mother   . Hyperlipidemia Sister     x3 sister  . Kidney disease Brother     SOCIAL HISTORY: Social History   Social History  . Marital Status: Divorced    Spouse Name: N/A  . Number of Children:  2  . Years of Education: N/A   Occupational History  .     Social History Main Topics  . Smoking status: Former Smoker -- 0.50 packs/day for 59 years    Types: Cigarettes  . Smokeless tobacco: Never Used     Comment: smoking since age [age]  . Alcohol Use: No  . Drug Use: No  . Sexual Activity: Not on file   Other Topics Concern  . Not on file   Social History Narrative    REVIEW OF SYSTEMS: Constitutional: No fevers, chills, or sweats, no generalized fatigue, change in appetite Eyes: No visual changes, double vision, eye pain Ear, nose and throat: No hearing loss, ear pain, nasal congestion, sore throat Cardiovascular: No chest pain, palpitations Respiratory:  No shortness of breath at rest or with exertion, wheezes GastrointestinaI: No nausea, vomiting, diarrhea, abdominal pain, fecal incontinence Genitourinary:  No dysuria, urinary retention or frequency Musculoskeletal:  No neck pain, back pain Integumentary: No rash, pruritus, skin lesions Neurological: as above Psychiatric: + depression, insomnia, anxiety Endocrine: No palpitations, fatigue, diaphoresis, mood swings, change in appetite, change in weight, increased thirst Hematologic/Lymphatic:  No anemia, purpura, petechiae. Allergic/Immunologic: no itchy/runny eyes, nasal congestion, recent allergic reactions, rashes  PHYSICAL EXAM: Filed Vitals:   08/02/15 1503  BP: 122/64  Pulse: 94   General: No acute distress, sitting on wheelchair Head:  Normocephalic/atraumatic Neck: supple, no paraspinal tenderness, full range of motion Heart:  Regular rate and rhythm Lungs:  Clear to auscultation bilaterally Back: No paraspinal tenderness Skin/Extremities: No rash, no edema Neurological Exam: alert and oriented to person, place, season. No aphasia or dysarthria. Fund of knowledge is appropriate.  Remote memory intact.  Attention and concentration are normal.    Able to name objects and repeat phrases. 1/5 MMSE - Mini  Mental State Exam 08/02/2015 01/26/2015  Orientation to time 1 0  Orientation to Place 5 4  Registration 3 3  Attention/ Calculation 0 1  Recall 1 0  Language- name 2 objects 2 2  Language- repeat 1 1  Language- follow 3 step command 3 2  Language- read &  follow direction 1 1  Write a sentence 1 1  Copy design 0 0  Total score 18 15   Cranial nerves: Pupils equal, round, reactive to light. Extraocular movements intact with no nystagmus. Visual fields full. Facial sensation intact. No facial asymmetry. Tongue, uvula, palate midline.  Motor: Bulk and tone normal, muscle strength 5/5 throughout with no pronator drift.  Sensation to light touch intact.  No extinction to double simultaneous stimulation.  Deep tendon reflexes brisk +3 on left UE, +2 right UE and bilateral patella, absent ankle jerks bilaterally, toes downgoing.  Finger to nose testing intact.  Gait not tested, sitting in wheelchair.  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 MRI brain no acute changes. MMSE today 18/30 (previously 15/30 in August 2016). Continue Aricept '10mg'$  daily. She endorses depression and anxiety, we again discussed effects of mood on memory, she is agreeable to seeing Behavioral Health. Continue Seroquel '25mg'$  qhs. We discussed the importance of physical exercise and brain stimulation exercise for brain health, she was given information for adult day programs. Continue 24/7 care. She will follow-up in 1 year and knows to call our office for any changes.   Thank you for allowing me to participate in her care.  Please do not hesitate to call for any questions or concerns.  The duration of this appointment visit was 25 minutes of face-to-face time with the patient.   Greater than 50% of this time was spent in counseling, explanation of diagnosis, planning of further management, and coordination of care.   Ellouise Newer, M.D.   CC: Dr. Wolfgang Phoenix

## 2015-08-02 NOTE — Telephone Encounter (Signed)
Ok six mo worth 

## 2015-08-02 NOTE — Patient Instructions (Addendum)
1. Continue Aricept '10mg'$  daily 2. Continue Seroquel '25mg'$  at bedtime 3. Refer to Behavioral Medicine (psychiatry) for depression 4. Consider adult day care. Continue 24/7 care 5. Physical exercise and brain stimulation exercises are important for brain health 6. Follow-up in 1 year

## 2015-08-03 MED ORDER — FOLIC ACID 1 MG PO TABS: 1.0000 mg | ORAL_TABLET | Freq: Every day | ORAL | Status: DC

## 2015-08-03 MED ORDER — FOLIC ACID 1 MG PO TABS: 1.0000 mg | ORAL_TABLET | Freq: Every day | ORAL | Status: AC

## 2015-08-03 NOTE — Telephone Encounter (Signed)
Called patient's emergency contact and informed her per Dr.Steve Luking- years supply of folic acid sent into requested pharmacy. Patient's emergency contact verbalized understanding

## 2015-08-03 NOTE — Telephone Encounter (Signed)
Something like this we can take over, ref for one yr

## 2015-08-05 DIAGNOSIS — F03B18 Unspecified dementia, moderate, with other behavioral disturbance: Secondary | ICD-10-CM | POA: Insufficient documentation

## 2015-08-05 DIAGNOSIS — F0391 Unspecified dementia with behavioral disturbance: Secondary | ICD-10-CM | POA: Insufficient documentation

## 2015-08-16 ENCOUNTER — Emergency Department (HOSPITAL_COMMUNITY)
Admission: EM | Admit: 2015-08-16 | Discharge: 2015-08-16 | Disposition: A | Discharge status: Home / Self Care | Payer: Medicare Other | Attending: Emergency Medicine | Admitting: Emergency Medicine

## 2015-08-16 ENCOUNTER — Encounter (HOSPITAL_COMMUNITY): Payer: Self-pay | Admitting: *Deleted

## 2015-08-16 ENCOUNTER — Emergency Department (HOSPITAL_COMMUNITY): Payer: Medicare Other

## 2015-08-16 DIAGNOSIS — R51 Headache: Secondary | ICD-10-CM | POA: Insufficient documentation

## 2015-08-16 DIAGNOSIS — K219 Gastro-esophageal reflux disease without esophagitis: Secondary | ICD-10-CM | POA: Diagnosis not present

## 2015-08-16 DIAGNOSIS — Z86018 Personal history of other benign neoplasm: Secondary | ICD-10-CM | POA: Insufficient documentation

## 2015-08-16 DIAGNOSIS — Z9889 Other specified postprocedural states: Secondary | ICD-10-CM | POA: Diagnosis not present

## 2015-08-16 DIAGNOSIS — R42 Dizziness and giddiness: Secondary | ICD-10-CM | POA: Insufficient documentation

## 2015-08-16 DIAGNOSIS — Z85118 Personal history of other malignant neoplasm of bronchus and lung: Secondary | ICD-10-CM | POA: Diagnosis not present

## 2015-08-16 DIAGNOSIS — R269 Unspecified abnormalities of gait and mobility: Secondary | ICD-10-CM | POA: Insufficient documentation

## 2015-08-16 DIAGNOSIS — N289 Disorder of kidney and ureter, unspecified: Secondary | ICD-10-CM | POA: Diagnosis not present

## 2015-08-16 DIAGNOSIS — M1990 Unspecified osteoarthritis, unspecified site: Secondary | ICD-10-CM | POA: Insufficient documentation

## 2015-08-16 DIAGNOSIS — E039 Hypothyroidism, unspecified: Secondary | ICD-10-CM | POA: Insufficient documentation

## 2015-08-16 DIAGNOSIS — K580 Irritable bowel syndrome with diarrhea: Secondary | ICD-10-CM | POA: Diagnosis not present

## 2015-08-16 DIAGNOSIS — Z79899 Other long term (current) drug therapy: Secondary | ICD-10-CM | POA: Diagnosis not present

## 2015-08-16 DIAGNOSIS — Z87891 Personal history of nicotine dependence: Secondary | ICD-10-CM | POA: Insufficient documentation

## 2015-08-16 DIAGNOSIS — E785 Hyperlipidemia, unspecified: Secondary | ICD-10-CM | POA: Diagnosis not present

## 2015-08-16 DIAGNOSIS — R5383 Other fatigue: Secondary | ICD-10-CM | POA: Diagnosis not present

## 2015-08-16 DIAGNOSIS — I2510 Atherosclerotic heart disease of native coronary artery without angina pectoris: Secondary | ICD-10-CM | POA: Diagnosis not present

## 2015-08-16 DIAGNOSIS — Z7982 Long term (current) use of aspirin: Secondary | ICD-10-CM | POA: Insufficient documentation

## 2015-08-16 DIAGNOSIS — I10 Essential (primary) hypertension: Secondary | ICD-10-CM | POA: Insufficient documentation

## 2015-08-16 DIAGNOSIS — J449 Chronic obstructive pulmonary disease, unspecified: Secondary | ICD-10-CM | POA: Diagnosis not present

## 2015-08-16 DIAGNOSIS — G4700 Insomnia, unspecified: Secondary | ICD-10-CM | POA: Diagnosis not present

## 2015-08-16 DIAGNOSIS — Z8701 Personal history of pneumonia (recurrent): Secondary | ICD-10-CM | POA: Insufficient documentation

## 2015-08-16 LAB — BASIC METABOLIC PANEL
Anion gap: 6 (ref 5–15)
BUN: 23 mg/dL — ABNORMAL HIGH (ref 6–20)
CHLORIDE: 108 mmol/L (ref 101–111)
CO2: 24 mmol/L (ref 22–32)
CREATININE: 2.17 mg/dL — AB (ref 0.44–1.00)
Calcium: 9.4 mg/dL (ref 8.9–10.3)
GFR calc non Af Amer: 21 mL/min — ABNORMAL LOW (ref >60)
GFR, EST AFRICAN AMERICAN: 24 mL/min — AB (ref >60)
Glucose, Bld: 111 mg/dL — ABNORMAL HIGH (ref 65–99)
Potassium: 3.9 mmol/L (ref 3.5–5.1)
SODIUM: 138 mmol/L (ref 135–145)

## 2015-08-16 LAB — CBC WITH DIFFERENTIAL/PLATELET
BASOS PCT: 2 %
Basophils Absolute: 0.1 10*3/uL (ref 0.0–0.1)
EOS ABS: 0.7 10*3/uL (ref 0.0–0.7)
Eosinophils Relative: 15 %
HCT: 30 % — ABNORMAL LOW (ref 36.0–46.0)
HEMOGLOBIN: 9.6 g/dL — AB (ref 12.0–15.0)
LYMPHS ABS: 1.1 10*3/uL (ref 0.7–4.0)
Lymphocytes Relative: 26 %
MCH: 33.1 pg (ref 26.0–34.0)
MCHC: 32 g/dL (ref 30.0–36.0)
MCV: 103.4 fL — ABNORMAL HIGH (ref 78.0–100.0)
Monocytes Absolute: 0.4 10*3/uL (ref 0.1–1.0)
Monocytes Relative: 10 %
NEUTROS PCT: 47 %
Neutro Abs: 2 10*3/uL (ref 1.7–7.7)
Platelets: 259 10*3/uL (ref 150–400)
RBC: 2.9 MIL/uL — AB (ref 3.87–5.11)
RDW: 14.6 % (ref 11.5–15.5)
WBC: 4.4 10*3/uL (ref 4.0–10.5)

## 2015-08-16 LAB — URINALYSIS, ROUTINE W REFLEX MICROSCOPIC
Bilirubin Urine: NEGATIVE
Glucose, UA: NEGATIVE mg/dL
Hgb urine dipstick: NEGATIVE
Ketones, ur: NEGATIVE mg/dL
LEUKOCYTES UA: NEGATIVE
NITRITE: NEGATIVE
Protein, ur: NEGATIVE mg/dL
SPECIFIC GRAVITY, URINE: 1.02 (ref 1.005–1.030)
pH: 5 (ref 5.0–8.0)

## 2015-08-16 LAB — TROPONIN I: TROPONIN I: 0.03 ng/mL (ref <0.031)

## 2015-08-16 MED ORDER — SODIUM CHLORIDE 0.9 % IV BOLUS (SEPSIS)
1000.0000 mL | Freq: Once | INTRAVENOUS | Status: AC
  Administered 2015-08-16: 1000 mL via INTRAVENOUS

## 2015-08-16 NOTE — ED Notes (Signed)
Dr. Wickline at bedside.  

## 2015-08-16 NOTE — ED Notes (Signed)
Pt states she was dizzy this morning after waking and had to lay back in the bed. Pt states headache began shortly after to right side of the head. States "throbbing, nagging"  pain to the head. Grasp strong and equal. Facial asymmetry noted.

## 2015-08-16 NOTE — Discharge Instructions (Signed)
PLEASE HOLD YOUR VASCOTEC BLOOD PRESSURE MEDICINE FOR 2 DAYS PLEASE SEE YOUR PRIMARY DOCTOR IN 2 DAYS   Your caregiver has seen you today because you are having problems with feelings of weakness, dizziness, and/or fatigue. Weakness has many different causes, some of which are common and others are very rare. Your caregiver has considered some of the most common causes of weakness and feels it is safe for you to go home and be observed. Not every illness or injury can be identified during an emergency department visit, thus follow-up with your primary healthcare provider is important. Medical conditions can also worsen, so it is also important to return immediately as directed below, or if you have other serious concerns develop.  RETURN IMMEDIATELY IF  you develop new shortness of breath, chest pain, fever, have difficulty moving parts of your body (new weakness, numbness, or incoordination), sudden change in speech, vision, swallowing, or understanding, faint or develop new dizziness, severe headache, become poorly responsive or have an altered mental status compared to baseline for you, new rash, abdominal pain, or bloody stools,  Return sooner also if you develop new problems for which you have not talked to your caregiver but you feel may be emergency medical conditions.

## 2015-08-16 NOTE — ED Provider Notes (Signed)
CSN: [id number]     Arrival date & time 08/16/15  1242 History  By signing my name below, I, Terressa Koyanagi, attest that this documentation has been prepared under the direction and in the presence of Ripley Fraise, MD. Electronically Signed: Terressa Koyanagi, ED Scribe. 08/16/2015. 1:09 PM.  Chief Complaint  Patient presents with  . Dizziness   The history is provided by the patient. No language interpreter was used.   PCP: Mickie Hillier, MD HPI Comments: [name] is a 81 y.o. female, with PMHx noted below, who presents to the Emergency Department complaining of sudden onset, now resolved episode of dizziness onset this morning upon awakening. Associated Sx include throbbing, right sided headache; and nausea. Pt denies having syncope during the episode of dizziness. Pt's daughter notes that she checked pt's BP this morning and it was very low; daughter reports that pt has a Hx of intermittent episodes of low blood pressure. Daughter further notes that while pt has been able to walk today, her gait has been very unsteady.Pt confirms diarrhea but notes that she has frequent episodes of diarrhea at baseline due to IBS. Pt denies n/v, fever, abd pain, visual disturbances, weakness of BUE/BLE, back pain, neck pain, recent falls/injuries, dysuria, or any numbness in BUE or BLE.    Past Medical History  Diagnosis Date  . Hypertension   . Hypothyroidism   . Hyperlipidemia   . Insomnia   . GERD (gastroesophageal reflux disease)     chronic gastritis  . Adrenal adenoma   . Chronic diarrhea   . Fatty liver   . Arthritis     knee  . QT prolongation     syncope  . Coronary artery disease   . Impaired fasting glucose   . Renal insufficiency     low protien diet  . Tobacco use     1/2 ppd, approx 25-50 pack years (as of 08/2012)  . Family history of heart disease   . IBS (irritable bowel syndrome)   . Peripheral arterial disease ([place])     sstatus post  infrarenal abdominal aortic tube graft  placed by Dr. Victorino Dike February 2008  . Cancer ([place])     Lung  . Squamous cell carcinoma of left lung ([place]) 07/29/2013  . HCAP (healthcare-associated pneumonia)     10/2014  . Acute respiratory failure ([place])     10/2014  . COPD (chronic obstructive pulmonary disease) ([place])     moderat  . Pleural effusion    Past Surgical History  Procedure Laterality Date  . Abdominal hysterectomy    . Thyroidectomy, partial    . Colonoscopy  5/04    normal  . Abdominal aortic aneurysm repair  07/2006    D. J.D. Kellie Simmering  . Cataract extraction Bilateral 2010  . Cardiac catheterization  02/2010    mod CAD in L-dominant system with normal LV function  . Colon resection  2005    1/2 colon removed  . Transthoracic echocardiogram  02/2010    EF 55-60%; mild conc LVH, normal systolic function; mildly calcified AV annulus  . Colonoscopy with esophagogastroduodenoscopy (egd) N/A 03/19/2013    Procedure: COLONOSCOPY WITH ESOPHAGOGASTRODUODENOSCOPY (EGD);  Surgeon: Rogene Houston, MD;  Location: [facility];  Service: Endoscopy;  Laterality: N/A;  145  . Dg biopsy lung Left Jan 2015   Family History  Problem Relation Age of Onset  . Hypertension Mother   . Diabetes Mother   . Heart attack Mother   . Hyperlipidemia  Sister     x3 sister  . Kidney disease Brother    Social History  Substance Use Topics  . Smoking status: Former Smoker -- 0.50 packs/day for 59 years    Types: Cigarettes  . Smokeless tobacco: Never Used     Comment: smoking since age [age]  . Alcohol Use: No   OB History    No data available     Review of Systems  Constitutional: Negative for fever.  Eyes: Negative for visual disturbance.  Gastrointestinal: Positive for diarrhea (at baseline due to IBS). Negative for nausea, vomiting and abdominal pain.  Genitourinary: Negative for dysuria.  Musculoskeletal: Positive for gait problem. Negative for back pain and neck pain.  Neurological: Positive for dizziness and headaches.  Negative for syncope, weakness and numbness.  All other systems reviewed and are negative.     Allergies  Aleve; Dilaudid; Dyazide; and Zithromax  Home Medications   Prior to Admission medications   Medication Sig Start Date End Date Taking? Authorizing Provider  allopurinol (ZYLOPRIM) 300 MG tablet take 1 tablet by mouth once daily 04/21/15   Mikey Kirschner, MD  ALPRAZolam Duanne Moron) 1 MG tablet TAKE ONE-HALF TO ONE TABLET BY MOUTH THREE TIMES DAILY AS NEEDED FOR ANXIETY 08/02/15   Mikey Kirschner, MD  amLODipine (NORVASC) 5 MG tablet take 1 tablet by mouth once daily 06/29/15   Kathyrn Drown, MD  aspirin EC 81 MG tablet Take 81 mg by mouth daily.    Historical Provider, MD  donepezil (ARICEPT) 10 MG tablet Take 1 tablet daily 08/02/15   Cameron Sprang, MD  DULoxetine (CYMBALTA) 60 MG capsule take 1 capsule by mouth once daily 05/26/15   Kathyrn Drown, MD  enalapril (VASOTEC) 10 MG tablet take 1 tablet by mouth once daily 07/05/15   Mikey Kirschner, MD  fenofibrate 160 MG tablet take 1 tablet by mouth once daily with food 04/12/15   Mikey Kirschner, MD  ferrous sulfate (FERROUSUL) 325 (65 FE) MG tablet Take 1 tablet (325 mg total) by mouth 2 (two) times daily after a meal. 03/19/13   Rogene Houston, MD  folic acid (FOLVITE) 1 MG tablet Take 1 tablet (1 mg total) by mouth daily. 08/03/15   Mikey Kirschner, MD  hydrALAZINE (APRESOLINE) 25 MG tablet take 1 tablet by mouth three times a day 04/26/15   Mikey Kirschner, MD  HYDROcodone-acetaminophen (NORCO/VICODIN) 5-325 MG tablet Take 1 tablet twice a day as needed for pain 06/14/15   Mikey Kirschner, MD  levothyroxine (SYNTHROID, LEVOTHROID) 137 MCG tablet take 1 tablet by mouth once daily 04/23/15   Mikey Kirschner, MD  pantoprazole (PROTONIX) 40 MG tablet Take 1 tablet (40 mg total) by mouth daily. 07/22/15   Mikey Kirschner, MD  potassium chloride (K-DUR) 10 MEQ tablet Take 2 tabs daily 04/08/15   Mikey Kirschner, MD  pravastatin  (PRAVACHOL) 20 MG tablet take 1 tablet by mouth once daily 05/04/15   Mikey Kirschner, MD  QUEtiapine (SEROQUEL) 25 MG tablet Take 1 tablet (25 mg total) by mouth at bedtime. 08/02/15   Cameron Sprang, MD  SPIRIVA HANDIHALER 18 MCG inhalation capsule Place 18 mcg into inhaler and inhale as needed.  11/14/14   Historical Provider, MD  traMADol Veatrice Bourbon) 50 MG tablet take 1 tablet by mouth every 6 hours if needed 06/14/15   Mikey Kirschner, MD  Vitamin D, Ergocalciferol, (DRISDOL) 50000 UNITS CAPS capsule Take 1 capsule (50,000  Units total) by mouth once a week. 06/14/15   Mikey Kirschner, MD   Triage Vitals: BP 107/51 mmHg  Pulse 98  Temp(Src) 98.4 F (36.9 C)  Resp 24  Ht '5\' 7"'$  (1.702 m)  Wt 142 lb (64.411 kg)  BMI 22.24 kg/m2  SpO2 97% Physical Exam CONSTITUTIONAL: Well developed/well nourished HEAD: Normocephalic/atraumatic EYES: EOMI/PERRL, no nystagmus, no ptosis ENMT: Mucous membranes moist NECK: supple no meningeal signs, no bruits CV: S1/S2 noted, no murmurs/rubs/gallops noted LUNGS: Lungs are clear to auscultation bilaterally, no apparent distress ABDOMEN: soft, nontender, no rebound or guarding GU:no cva tenderness NEURO:Awake/alert, face symmetric, no arm or leg drift is noted Equal 5/5 strength with shoulder abduction, elbow flex/extension, wrist flex/extension in upper extremities and equal hand grips bilaterally Equal 5/5 strength with hip flexion,knee flex/extension, foot dorsi/plantar flexion Cranial nerves 3/4/5/6/01/01/09/11/12 tested and intact Sensation to light touch intact in all extremities EXTREMITIES: pulses normal, full ROM SKIN: warm, color normal PSYCH: no abnormalities of mood noted   ED Course  Procedures  DIAGNOSTIC STUDIES: Oxygen Saturation is 97% on [place], normal by my interpretation.    COORDINATION OF CARE: 1:05 PM: Discussed treatment plan which includes labs with pt at bedside; patient verbalizes understanding and agrees with treatment  plan. 1:50 PM Pt very poor historian Admits HA is not new Reports "dizzy" but denies any near syncope and denies vertigo Another daughter has arrived and reports she has "been leaning to the right" while walking ?ataxia by history.  Due to unclear history/age, will proceed with CT head.  Labs pending No signs of orthostatic hypotension. There is no facial asymmetry on my eval and daughter reports her smile appears normal  tPA in stroke considered but not given due to: Onset over 3-4.5hours, pt woke up with symptoms 4:07 PM Pt improved She ambulated She does have renal insufficiency, IV fluids given and advised to hold vasotec for 2 days and PCP f/u Seen by case management for home health Otherwise appropriate for d/c home I doubt occult stroke, or other acute emergent process  Labs Review Labs Reviewed  BASIC METABOLIC PANEL - Abnormal; Notable for the following:    Glucose, Bld 111 (*)    BUN 23 (*)    Creatinine, Ser 2.17 (*)    GFR calc non Af Amer 21 (*)    GFR calc Af Amer 24 (*)    All other components within normal limits  CBC WITH DIFFERENTIAL/PLATELET - Abnormal; Notable for the following:    RBC 2.90 (*)    Hemoglobin 9.6 (*)    HCT 30.0 (*)    MCV 103.4 (*)    All other components within normal limits  URINALYSIS, ROUTINE W REFLEX MICROSCOPIC (NOT AT [facility])  TROPONIN I    I have personally reviewed and evaluated these lab results as part of my medical decision-making.   EKG Interpretation   Date/Time:  Monday August 16 2015 12:56:17 EST Ventricular Rate:  87 PR Interval:  186 QRS Duration: 83 QT Interval:  353 QTC Calculation: 425 R Axis:   102 Text Interpretation:  Sinus rhythm Ventricular premature complex Consider  left atrial enlargement Right axis deviation Anteroseptal infarct, old  Nonspecific T abnormalities, inferior leads No significant change since  last tracing Confirmed by Christy Gentles  MD, [place] ([id number]) on 08/16/2015  1:18:27 PM      MDM    Final diagnoses:  Renal insufficiency  Other fatigue  Dizziness    Nursing notes including past medical history and social history  reviewed and considered in documentation Labs/vital reviewed myself and considered during evaluation   I personally performed the services described in this documentation, which was scribed in my presence. The recorded information has been reviewed and is accurate.       Ripley Fraise, MD 08/16/15 [phone]

## 2015-08-16 NOTE — ED Notes (Signed)
Ambulated patient to bathroom with family assist. Pt's gait was steady and even with assistance.

## 2015-08-18 ENCOUNTER — Telehealth: Payer: Self-pay | Admitting: Family Medicine

## 2015-08-18 DIAGNOSIS — R531 Weakness: Secondary | ICD-10-CM | POA: Diagnosis not present

## 2015-08-18 DIAGNOSIS — I10 Essential (primary) hypertension: Secondary | ICD-10-CM | POA: Diagnosis not present

## 2015-08-18 DIAGNOSIS — R42 Dizziness and giddiness: Secondary | ICD-10-CM | POA: Diagnosis not present

## 2015-08-18 DIAGNOSIS — C3490 Malignant neoplasm of unspecified part of unspecified bronchus or lung: Secondary | ICD-10-CM | POA: Diagnosis not present

## 2015-08-18 DIAGNOSIS — R197 Diarrhea, unspecified: Secondary | ICD-10-CM | POA: Diagnosis not present

## 2015-08-18 DIAGNOSIS — I739 Peripheral vascular disease, unspecified: Secondary | ICD-10-CM | POA: Diagnosis not present

## 2015-08-18 DIAGNOSIS — E039 Hypothyroidism, unspecified: Secondary | ICD-10-CM | POA: Diagnosis not present

## 2015-08-18 DIAGNOSIS — E785 Hyperlipidemia, unspecified: Secondary | ICD-10-CM | POA: Diagnosis not present

## 2015-08-18 DIAGNOSIS — J449 Chronic obstructive pulmonary disease, unspecified: Secondary | ICD-10-CM | POA: Diagnosis not present

## 2015-08-18 DIAGNOSIS — Z9981 Dependence on supplemental oxygen: Secondary | ICD-10-CM | POA: Diagnosis not present

## 2015-08-18 NOTE — Telephone Encounter (Signed)
Patient was referred to [facility] in [place] for psychiatry. The doctor at that office couldn't see her and recommended patient sees Dr. Casimiro Needle because he sees the geriatric patient population. I called patient's daughter [name] to make her aware of this as the patient does live in [place] which would be more convenient for the family since the patient doesn't drive. Dr. Casimiro Needle is in the [place] office. When I spoke with her and explained everything she said that she would talk it over with her sister to see if they would be able to work out transportation to her visits if we referred her to Dr. Casimiro Needle. She states she would call me back and let me know.

## 2015-08-19 ENCOUNTER — Encounter (HOSPITAL_COMMUNITY): Payer: Self-pay | Admitting: Emergency Medicine

## 2015-08-19 ENCOUNTER — Emergency Department (HOSPITAL_COMMUNITY)
Admission: EM | Admit: 2015-08-19 | Discharge: 2015-08-20 | Disposition: A | Discharge status: Home / Self Care | Payer: Medicare Other | Attending: Emergency Medicine | Admitting: Emergency Medicine

## 2015-08-19 ENCOUNTER — Encounter: Payer: Self-pay | Admitting: Family Medicine

## 2015-08-19 ENCOUNTER — Ambulatory Visit (INDEPENDENT_AMBULATORY_CARE_PROVIDER_SITE_OTHER): Payer: Medicare Other | Admitting: Family Medicine

## 2015-08-19 VITALS — BP 138/70 | Temp 98.9°F | Ht 67.0 in | Wt 143.0 lb

## 2015-08-19 DIAGNOSIS — Z87891 Personal history of nicotine dependence: Secondary | ICD-10-CM | POA: Diagnosis not present

## 2015-08-19 DIAGNOSIS — Z888 Allergy status to other drugs, medicaments and biological substances status: Secondary | ICD-10-CM | POA: Diagnosis not present

## 2015-08-19 DIAGNOSIS — C7492 Malignant neoplasm of unspecified part of left adrenal gland: Secondary | ICD-10-CM | POA: Insufficient documentation

## 2015-08-19 DIAGNOSIS — T464X5A Adverse effect of angiotensin-converting-enzyme inhibitors, initial encounter: Secondary | ICD-10-CM | POA: Diagnosis not present

## 2015-08-19 DIAGNOSIS — I10 Essential (primary) hypertension: Secondary | ICD-10-CM

## 2015-08-19 DIAGNOSIS — I739 Peripheral vascular disease, unspecified: Secondary | ICD-10-CM | POA: Diagnosis not present

## 2015-08-19 DIAGNOSIS — E785 Hyperlipidemia, unspecified: Secondary | ICD-10-CM

## 2015-08-19 DIAGNOSIS — Z79899 Other long term (current) drug therapy: Secondary | ICD-10-CM | POA: Diagnosis not present

## 2015-08-19 DIAGNOSIS — J449 Chronic obstructive pulmonary disease, unspecified: Secondary | ICD-10-CM | POA: Insufficient documentation

## 2015-08-19 DIAGNOSIS — Z85118 Personal history of other malignant neoplasm of bronchus and lung: Secondary | ICD-10-CM | POA: Diagnosis not present

## 2015-08-19 DIAGNOSIS — T465X1A Poisoning by other antihypertensive drugs, accidental (unintentional), initial encounter: Secondary | ICD-10-CM | POA: Diagnosis not present

## 2015-08-19 DIAGNOSIS — I2510 Atherosclerotic heart disease of native coronary artery without angina pectoris: Secondary | ICD-10-CM | POA: Insufficient documentation

## 2015-08-19 DIAGNOSIS — Z7982 Long term (current) use of aspirin: Secondary | ICD-10-CM | POA: Insufficient documentation

## 2015-08-19 DIAGNOSIS — F411 Generalized anxiety disorder: Secondary | ICD-10-CM | POA: Diagnosis not present

## 2015-08-19 DIAGNOSIS — G4700 Insomnia, unspecified: Secondary | ICD-10-CM | POA: Insufficient documentation

## 2015-08-19 DIAGNOSIS — I952 Hypotension due to drugs: Secondary | ICD-10-CM | POA: Diagnosis not present

## 2015-08-19 DIAGNOSIS — E039 Hypothyroidism, unspecified: Secondary | ICD-10-CM | POA: Insufficient documentation

## 2015-08-19 MED ORDER — MECLIZINE HCL 25 MG PO TABS: 25.0000 mg | ORAL_TABLET | Freq: Three times a day (TID) | ORAL | Status: DC | PRN

## 2015-08-19 MED ORDER — ENALAPRIL MALEATE 5 MG PO TABS: 5.0000 mg | ORAL_TABLET | Freq: Every day | ORAL | Status: DC

## 2015-08-19 NOTE — Progress Notes (Signed)
   Subjective:    Patient ID: [name], female    DOB: 1935-03-27, 81 y.o.   MRN: [id number]  HPI Patient is here today for a ER follow up visit. Patient was seen ay [facility] ER on 08/16/15 for dizziness. Patient states that the dizziness has improved. Patient was seen by the emergency room, note is reviewed in the presence of the patient. Felt to have low blood pressure and volume contraction. Was asked to hold off on her ACE inhibitor for a couple days. Patient resumed last night. Notes occasional lightheadedness when she stands up quickly   Patient complains of abdominal pain, diffuse in nature, often associated with constipation. Comes and goes. Patient's oncologist has a scan scheduled in a couple months to follow-up on this.   and fatigue. , Long-standing in nature aggressive down through the years. Patient claims no substantial depression, but notes her ongoing anxiety   Patient states that she is having some constipation also.   Review of Systems    no fever no chills no change in urinary habits no loss of consciousness no seizure activity Objective:   Physical Exam  Alert mild malaise no acute distress vital stable blood pressure 108/70 on repeat similar both arms lungs clear heart rare rhythm abdomen no discrete tenderness patient anxious appearing which is her baseline      Assessment & Plan:  Impression 1 hypertension controlled to brisk discussed #2 constipation chronic encouraged use Maalox every day #3 fatigue ongoing quite challenging with all her clinical difficulties #4 hyperlipidemia status uncertain #5 chronic anxiety plan appropriate blood work. Diet exercise discussed. Cut Vasotec and half rationale discussed [facility] follow-up regular scheduled appointment

## 2015-08-19 NOTE — ED Notes (Signed)
Family reports pt accidentally took too much BP medication. Pt hypotensive at home.

## 2015-08-19 NOTE — ED Provider Notes (Signed)
CSN: [id number]     Arrival date & time 08/19/15  2151 History  By signing my name below, I, Dora Sims, attest that this documentation has been prepared under the direction and in the presence of Merryl Hacker, MD . Electronically Signed: Dora Sims, Scribe. 08/20/2015. 12:32 AM.    Chief Complaint  Patient presents with  . Hypotension     The history is provided by the patient. No language interpreter was used.     HPI Comments: [name] is a 81 y.o. female with h/o HTN who presents to the Emergency Department s/p taking too much HTN medication earlier this afternoon. Pt woke up from a nap and accidentally took her HTN medication for the following morning after already taking it earlier this morning; pt takes Norvasc and LIsinopril for her blood pressure. Unknown when the patient exactly took the medication but the daughter states that it was likely around 8 PM. Her blood pressure was measured at 85/43 at home. Pt states that she feels fine currently. She denies body pain, dizziness, or any other associated symptoms at this time.    Past Medical History  Diagnosis Date  . Hypertension   . Hypothyroidism   . Hyperlipidemia   . Insomnia   . GERD (gastroesophageal reflux disease)     chronic gastritis  . Adrenal adenoma   . Chronic diarrhea   . Fatty liver   . Arthritis     knee  . QT prolongation     syncope  . Coronary artery disease   . Impaired fasting glucose   . Renal insufficiency     low protien diet  . Tobacco use     1/2 ppd, approx 25-50 pack years (as of 08/2012)  . Family history of heart disease   . IBS (irritable bowel syndrome)   . Peripheral arterial disease ([place])     sstatus post  infrarenal abdominal aortic tube graft placed by Dr. Victorino Dike February 2008  . Cancer ([place])     Lung  . Squamous cell carcinoma of left lung ([place]) 07/29/2013  . HCAP (healthcare-associated pneumonia)     10/2014  . Acute respiratory failure ([place])     10/2014  .  COPD (chronic obstructive pulmonary disease) ([place])     moderat  . Pleural effusion    Past Surgical History  Procedure Laterality Date  . Abdominal hysterectomy    . Thyroidectomy, partial    . Colonoscopy  5/04    normal  . Abdominal aortic aneurysm repair  07/2006    D. J.D. Kellie Simmering  . Cataract extraction Bilateral 2010  . Cardiac catheterization  02/2010    mod CAD in L-dominant system with normal LV function  . Colon resection  2005    1/2 colon removed  . Transthoracic echocardiogram  02/2010    EF 55-60%; mild conc LVH, normal systolic function; mildly calcified AV annulus  . Colonoscopy with esophagogastroduodenoscopy (egd) N/A 03/19/2013    Procedure: COLONOSCOPY WITH ESOPHAGOGASTRODUODENOSCOPY (EGD);  Surgeon: Rogene Houston, MD;  Location[facility];  Service: Endoscopy;  Laterality: N/A;  145  . Dg biopsy lung Left Jan 2015   Family History  Problem Relation Age of Onset  . Hypertension Mother   . Diabetes Mother   . Heart attack Mother   . Hyperlipidemia Sister     x3 sister  . Kidney disease Brother    Social History  Substance Use Topics  . Smoking status: Former Smoker -- 0.50  packs/day for 59 years    Types: Cigarettes  . Smokeless tobacco: Never Used     Comment: smoking since age [age]  . Alcohol Use: No   OB History    Gravida Para Term Preterm AB TAB SAB Ectopic Multiple Living   '2 2 2            '$ Review of Systems  Respiratory: Negative for shortness of breath.   Cardiovascular: Negative for chest pain.  Musculoskeletal: Negative for myalgias.  Neurological: Negative for dizziness.  All other systems reviewed and are negative.     Allergies  Aleve; Dilaudid; Dyazide; and Zithromax  Home Medications   Prior to Admission medications   Medication Sig Start Date End Date Taking? Authorizing Provider  allopurinol (ZYLOPRIM) 300 MG tablet take 1 tablet by mouth once daily 04/21/15  Yes Mikey Kirschner, MD  ALPRAZolam Duanne Moron) 1 MG tablet  TAKE ONE-HALF TO ONE TABLET BY MOUTH THREE TIMES DAILY AS NEEDED FOR ANXIETY 08/02/15  Yes Mikey Kirschner, MD  amLODipine (NORVASC) 5 MG tablet take 1 tablet by mouth once daily 06/29/15  Yes Kathyrn Drown, MD  aspirin EC 81 MG tablet Take 81 mg by mouth daily.   Yes Historical Provider, MD  donepezil (ARICEPT) 10 MG tablet Take 1 tablet daily 08/02/15  Yes Cameron Sprang, MD  DULoxetine (CYMBALTA) 60 MG capsule take 1 capsule by mouth once daily 05/26/15  Yes Kathyrn Drown, MD  enalapril (VASOTEC) 5 MG tablet Take 1 tablet (5 mg total) by mouth daily. 08/19/15  Yes Mikey Kirschner, MD  fenofibrate 160 MG tablet take 1 tablet by mouth once daily with food 04/12/15  Yes Mikey Kirschner, MD  ferrous sulfate (FERROUSUL) 325 (65 FE) MG tablet Take 1 tablet (325 mg total) by mouth 2 (two) times daily after a meal. 03/19/13  Yes Rogene Houston, MD  folic acid (FOLVITE) 1 MG tablet Take 1 tablet (1 mg total) by mouth daily. 08/03/15  Yes Mikey Kirschner, MD  hydrALAZINE (APRESOLINE) 25 MG tablet take 1 tablet by mouth three times a day 04/26/15  Yes Mikey Kirschner, MD  HYDROcodone-acetaminophen (NORCO/VICODIN) 5-325 MG tablet Take 1 tablet twice a day as needed for pain Patient taking differently: Take 1 tablet by mouth 2 (two) times daily as needed for moderate pain or severe pain. Take 1 tablet twice a day as needed for pain 06/14/15  Yes Mikey Kirschner, MD  levothyroxine (SYNTHROID, LEVOTHROID) 137 MCG tablet take 1 tablet by mouth once daily 04/23/15  Yes Mikey Kirschner, MD  lidocaine-prilocaine (EMLA) cream Apply 1 application topically as needed (port).   Yes Historical Provider, MD  loperamide (IMODIUM) 2 MG capsule Take 4 mg by mouth as needed for diarrhea or loose stools.   Yes Historical Provider, MD  meclizine (ANTIVERT) 25 MG tablet Take 1 tablet (25 mg total) by mouth 3 (three) times daily as needed for dizziness. 08/19/15  Yes Mikey Kirschner, MD  Menthol-Methyl Salicylate (MUSCLE RUB)  10-15 % CREA Apply 1 application topically as needed for muscle pain.   Yes Historical Provider, MD  ondansetron (ZOFRAN-ODT) 4 MG disintegrating tablet Take 4 mg by mouth every 8 (eight) hours as needed for nausea or vomiting.   Yes Historical Provider, MD  pantoprazole (PROTONIX) 40 MG tablet Take 1 tablet (40 mg total) by mouth daily. 07/22/15  Yes Mikey Kirschner, MD  potassium chloride (K-DUR) 10 MEQ tablet Take 2 tabs daily Patient  taking differently: Take 20 mEq by mouth daily. Take 2 tabs daily 04/08/15  Yes Mikey Kirschner, MD  pravastatin (PRAVACHOL) 20 MG tablet take 1 tablet by mouth once daily 05/04/15  Yes Mikey Kirschner, MD  QUEtiapine (SEROQUEL) 25 MG tablet Take 1 tablet (25 mg total) by mouth at bedtime. 08/02/15  Yes Cameron Sprang, MD  SPIRIVA HANDIHALER 18 MCG inhalation capsule Place 18 mcg into inhaler and inhale as needed.  11/14/14  Yes Historical Provider, MD  traMADol (ULTRAM) 50 MG tablet take 1 tablet by mouth every 6 hours if needed Patient taking differently: Take 50 mg by mouth every 6 (six) hours as needed for moderate pain.  06/14/15  Yes Mikey Kirschner, MD  Vitamin D, Ergocalciferol, (DRISDOL) 50000 UNITS CAPS capsule Take 1 capsule (50,000 Units total) by mouth once a week. 06/14/15  Yes Mikey Kirschner, MD   BP 101/58 mmHg  Pulse 64  Temp(Src) 99.1 F (37.3 C) (Oral)  Resp 18  Ht '5\' 6"'$  (1.676 m)  Wt 143 lb (64.864 kg)  BMI 23.09 kg/m2  SpO2 99% Physical Exam  Constitutional: She is oriented to person, place, and time. No distress.  Elderly, no acute distress  HENT:  Head: Normocephalic and atraumatic.  Cardiovascular: Normal rate, regular rhythm and normal heart sounds.   No murmur heard. Pulmonary/Chest: Effort normal and breath sounds normal. No respiratory distress. She has no wheezes.  Abdominal: Soft. Bowel sounds are normal. There is no tenderness. There is no rebound.  Neurological: She is alert and oriented to person, place, and time.   Skin: Skin is warm and dry.  Psychiatric: She has a normal mood and affect.  Nursing note and vitals reviewed.   ED Course  Procedures (including critical care time)  DIAGNOSTIC STUDIES: Oxygen Saturation is 100% on [place], normal by my interpretation.    COORDINATION OF CARE: 12:03 AM Discussed treatment plan with pt at bedside and pt agreed to plan.   Labs Review Labs Reviewed - No data to display  Imaging Review No results found.    EKG Interpretation None      MDM   Final diagnoses:  Hypotension due to drugs    Patient presents after taking too much of her blood pressure medication. Blood pressure 104/55.  Patient is asymptomatic. Given peak onset of amlodipine, will observe until 6 AM.  6:04 AM Patient has been resting comfortably overnight. Baseline blood pressures 120s over 60s. Patient's blood pressures overnight high 90s over 50s. She was given a 1 L fluid bolus. She continues to be asymptomatic. She is able to eat and ambulate independently. Patient will need to discontinue blood pressure medications today, resume as normal tomorrow. Follow-up with primary physician.   I personally performed the services described in this documentation, which was scribed in my presence. The recorded information has been reviewed and is accurate.      Merryl Hacker, MD 08/20/15 [phone]

## 2015-08-20 ENCOUNTER — Telehealth: Payer: Self-pay | Admitting: *Deleted

## 2015-08-20 MED ORDER — SODIUM CHLORIDE 0.9 % IV BOLUS (SEPSIS)
1000.0000 mL | Freq: Once | INTRAVENOUS | Status: AC
  Administered 2015-08-20: 1000 mL via INTRAVENOUS

## 2015-08-20 NOTE — Telephone Encounter (Signed)
ok 

## 2015-08-20 NOTE — ED Notes (Signed)
Pt awake to verbal stimuli. Warm and dry. Denies any headache, lightheaded, dizziness, CP, SOB, nausea symptoms "I feel fine." Visitor reassured.

## 2015-08-20 NOTE — Telephone Encounter (Signed)
[facility] calling to report pt canceled physical therapy appt today due to the fact that she was in the ER last night.

## 2015-08-20 NOTE — Discharge Instructions (Signed)
You were seen today for low blood pressures. This is likely related to taking a double dose of your blood pressure medication last night. Hold blood pressure medications today and do not take additional doses. Resume tomorrow.  Hypotension As your heart beats, it forces blood through your arteries. This force is your blood pressure. If your blood pressure is too low for you to go about your normal activities or to support the organs of your body, you have hypotension. Hypotension is also referred to as low blood pressure. When your blood pressure becomes too low, you may not get enough blood to your brain. As a result, you may feel weak, feel lightheaded, or develop a rapid heart rate. In a more severe case, you may faint. CAUSES Various conditions can cause hypotension. These include:  Blood loss.  Dehydration.  Heart or endocrine problems.  Pregnancy.  Severe infection.  Not having a well-balanced diet filled with needed nutrients.  Severe allergic reactions (anaphylaxis). Some medicines, such as blood pressure medicine or water pills (diuretics), may lower your blood pressure below normal. Sometimes taking too much medicine or taking medicine not as directed can cause hypotension. TREATMENT  Hospitalization is sometimes required for hypotension if fluid or blood replacement is needed, if time is needed for medicines to wear off, or if further monitoring is needed. Treatment might include changing your diet, changing your medicines (including medicines aimed at raising your blood pressure), and use of support stockings. HOME CARE INSTRUCTIONS   Drink enough fluids to keep your urine clear or pale yellow.  Take your medicines as directed by your health care provider.  Get up slowly from reclining or sitting positions. This gives your blood pressure a chance to adjust.  Wear support stockings as directed by your health care provider.  Maintain a healthy diet by including nutritious  food, such as fruits, vegetables, nuts, whole grains, and lean meats. SEEK MEDICAL CARE IF:  You have vomiting or diarrhea.  You have a fever for more than 2-3 days.  You feel more thirsty than usual.  You feel weak and tired. SEEK IMMEDIATE MEDICAL CARE IF:   You have chest pain or a fast or irregular heartbeat.  You have a loss of feeling in some part of your body, or you lose movement in your arms or legs.  You have trouble speaking.  You become sweaty or feel lightheaded.  You faint. MAKE SURE YOU:   Understand these instructions.  Will watch your condition.  Will get help right away if you are not doing well or get worse.   This information is not intended to replace advice given to you by your health care provider. Make sure you discuss any questions you have with your health care provider.   Document Released: 06/12/2005 Document Revised: 04/02/2013 Document Reviewed: 12/13/2012 Elsevier Interactive Patient Education [employer].

## 2015-08-20 NOTE — ED Notes (Signed)
Pt ambulated with 1 person assist ~ 50 feet. Remained asymptomatic. Per sister, she has been getting progressively weaker over the past couple weeks. Home health was at their house yesterday to evaluate for PT needs.   Pt and sister verbalized understanding of dc instructions and medication regimen.

## 2015-08-21 DIAGNOSIS — I10 Essential (primary) hypertension: Secondary | ICD-10-CM | POA: Diagnosis not present

## 2015-08-21 DIAGNOSIS — R42 Dizziness and giddiness: Secondary | ICD-10-CM | POA: Diagnosis not present

## 2015-08-21 DIAGNOSIS — C3490 Malignant neoplasm of unspecified part of unspecified bronchus or lung: Secondary | ICD-10-CM | POA: Diagnosis not present

## 2015-08-21 DIAGNOSIS — R531 Weakness: Secondary | ICD-10-CM | POA: Diagnosis not present

## 2015-08-21 DIAGNOSIS — R197 Diarrhea, unspecified: Secondary | ICD-10-CM | POA: Diagnosis not present

## 2015-08-21 DIAGNOSIS — J449 Chronic obstructive pulmonary disease, unspecified: Secondary | ICD-10-CM | POA: Diagnosis not present

## 2015-08-24 DIAGNOSIS — C3490 Malignant neoplasm of unspecified part of unspecified bronchus or lung: Secondary | ICD-10-CM | POA: Diagnosis not present

## 2015-08-24 DIAGNOSIS — R531 Weakness: Secondary | ICD-10-CM | POA: Diagnosis not present

## 2015-08-24 DIAGNOSIS — I10 Essential (primary) hypertension: Secondary | ICD-10-CM | POA: Diagnosis not present

## 2015-08-24 DIAGNOSIS — J449 Chronic obstructive pulmonary disease, unspecified: Secondary | ICD-10-CM | POA: Diagnosis not present

## 2015-08-24 DIAGNOSIS — R197 Diarrhea, unspecified: Secondary | ICD-10-CM | POA: Diagnosis not present

## 2015-08-24 DIAGNOSIS — R42 Dizziness and giddiness: Secondary | ICD-10-CM | POA: Diagnosis not present

## 2015-08-26 DIAGNOSIS — R531 Weakness: Secondary | ICD-10-CM | POA: Diagnosis not present

## 2015-08-26 DIAGNOSIS — C3490 Malignant neoplasm of unspecified part of unspecified bronchus or lung: Secondary | ICD-10-CM | POA: Diagnosis not present

## 2015-08-26 DIAGNOSIS — R197 Diarrhea, unspecified: Secondary | ICD-10-CM | POA: Diagnosis not present

## 2015-08-26 DIAGNOSIS — J449 Chronic obstructive pulmonary disease, unspecified: Secondary | ICD-10-CM | POA: Diagnosis not present

## 2015-08-26 DIAGNOSIS — I10 Essential (primary) hypertension: Secondary | ICD-10-CM | POA: Diagnosis not present

## 2015-08-26 DIAGNOSIS — R42 Dizziness and giddiness: Secondary | ICD-10-CM | POA: Diagnosis not present

## 2015-08-29 ENCOUNTER — Other Ambulatory Visit: Payer: Self-pay | Admitting: Family Medicine

## 2015-08-30 NOTE — Telephone Encounter (Signed)
This is Dr. Sandrea Hughs patient thank you

## 2015-08-31 DIAGNOSIS — R531 Weakness: Secondary | ICD-10-CM | POA: Diagnosis not present

## 2015-08-31 DIAGNOSIS — R42 Dizziness and giddiness: Secondary | ICD-10-CM | POA: Diagnosis not present

## 2015-08-31 DIAGNOSIS — I10 Essential (primary) hypertension: Secondary | ICD-10-CM | POA: Diagnosis not present

## 2015-08-31 DIAGNOSIS — R197 Diarrhea, unspecified: Secondary | ICD-10-CM | POA: Diagnosis not present

## 2015-08-31 DIAGNOSIS — J449 Chronic obstructive pulmonary disease, unspecified: Secondary | ICD-10-CM | POA: Diagnosis not present

## 2015-08-31 DIAGNOSIS — C3490 Malignant neoplasm of unspecified part of unspecified bronchus or lung: Secondary | ICD-10-CM | POA: Diagnosis not present

## 2015-09-02 DIAGNOSIS — E785 Hyperlipidemia, unspecified: Secondary | ICD-10-CM | POA: Diagnosis not present

## 2015-09-02 DIAGNOSIS — Z79899 Other long term (current) drug therapy: Secondary | ICD-10-CM | POA: Diagnosis not present

## 2015-09-03 DIAGNOSIS — R197 Diarrhea, unspecified: Secondary | ICD-10-CM | POA: Diagnosis not present

## 2015-09-03 DIAGNOSIS — J449 Chronic obstructive pulmonary disease, unspecified: Secondary | ICD-10-CM | POA: Diagnosis not present

## 2015-09-03 DIAGNOSIS — I10 Essential (primary) hypertension: Secondary | ICD-10-CM | POA: Diagnosis not present

## 2015-09-03 DIAGNOSIS — R42 Dizziness and giddiness: Secondary | ICD-10-CM | POA: Diagnosis not present

## 2015-09-03 DIAGNOSIS — R531 Weakness: Secondary | ICD-10-CM | POA: Diagnosis not present

## 2015-09-03 DIAGNOSIS — C3490 Malignant neoplasm of unspecified part of unspecified bronchus or lung: Secondary | ICD-10-CM | POA: Diagnosis not present

## 2015-09-03 LAB — BASIC METABOLIC PANEL
BUN/Creatinine Ratio: 10 — ABNORMAL LOW (ref 11–26)
BUN: 17 mg/dL (ref 8–27)
CALCIUM: 9.5 mg/dL (ref 8.7–10.3)
CO2: 21 mmol/L (ref 18–29)
CREATININE: 1.75 mg/dL — AB (ref 0.57–1.00)
Chloride: 104 mmol/L (ref 96–106)
GFR calc Af Amer: 32 mL/min/{1.73_m2} — ABNORMAL LOW (ref >59)
GFR, EST NON AFRICAN AMERICAN: 27 mL/min/{1.73_m2} — AB (ref >59)
Glucose: 90 mg/dL (ref 65–99)
Potassium: 3.9 mmol/L (ref 3.5–5.2)
Sodium: 142 mmol/L (ref 134–144)

## 2015-09-03 LAB — HEPATIC FUNCTION PANEL
ALBUMIN: 4 g/dL (ref 3.5–4.8)
ALT: 7 IU/L (ref 0–32)
AST: 19 IU/L (ref 0–40)
Alkaline Phosphatase: 42 IU/L (ref 39–117)
Bilirubin Total: 0.4 mg/dL (ref 0.0–1.2)
Bilirubin, Direct: 0.16 mg/dL (ref 0.00–0.40)
TOTAL PROTEIN: 6.2 g/dL (ref 6.0–8.5)

## 2015-09-03 LAB — LIPID PANEL
CHOL/HDL RATIO: 2.7 ratio (ref 0.0–4.4)
Cholesterol, Total: 139 mg/dL (ref 100–199)
HDL: 52 mg/dL (ref >39)
LDL CALC: 57 mg/dL (ref 0–99)
Triglycerides: 152 mg/dL — ABNORMAL HIGH (ref 0–149)
VLDL CHOLESTEROL CAL: 30 mg/dL (ref 5–40)

## 2015-09-06 ENCOUNTER — Encounter (HOSPITAL_COMMUNITY): Payer: Medicare Other

## 2015-09-06 DIAGNOSIS — C3490 Malignant neoplasm of unspecified part of unspecified bronchus or lung: Secondary | ICD-10-CM | POA: Diagnosis not present

## 2015-09-06 DIAGNOSIS — R531 Weakness: Secondary | ICD-10-CM | POA: Diagnosis not present

## 2015-09-06 DIAGNOSIS — R197 Diarrhea, unspecified: Secondary | ICD-10-CM | POA: Diagnosis not present

## 2015-09-06 DIAGNOSIS — R42 Dizziness and giddiness: Secondary | ICD-10-CM | POA: Diagnosis not present

## 2015-09-06 DIAGNOSIS — I10 Essential (primary) hypertension: Secondary | ICD-10-CM | POA: Diagnosis not present

## 2015-09-06 DIAGNOSIS — J449 Chronic obstructive pulmonary disease, unspecified: Secondary | ICD-10-CM | POA: Diagnosis not present

## 2015-09-08 ENCOUNTER — Encounter (HOSPITAL_COMMUNITY): Payer: Medicare Other | Attending: Hematology and Oncology

## 2015-09-08 VITALS — BP 104/56 | HR 70 | Temp 98.2°F | Resp 18

## 2015-09-08 DIAGNOSIS — C3492 Malignant neoplasm of unspecified part of left bronchus or lung: Secondary | ICD-10-CM | POA: Insufficient documentation

## 2015-09-08 DIAGNOSIS — Z452 Encounter for adjustment and management of vascular access device: Secondary | ICD-10-CM | POA: Diagnosis not present

## 2015-09-08 DIAGNOSIS — C3432 Malignant neoplasm of lower lobe, left bronchus or lung: Secondary | ICD-10-CM

## 2015-09-08 MED ORDER — HEPARIN SOD (PORK) LOCK FLUSH 100 UNIT/ML IV SOLN
500.0000 [IU] | Freq: Once | INTRAVENOUS | Status: AC
  Administered 2015-09-08: 500 [IU] via INTRAVENOUS

## 2015-09-08 MED ORDER — HEPARIN SOD (PORK) LOCK FLUSH 100 UNIT/ML IV SOLN
INTRAVENOUS | Status: AC
  Filled 2015-09-08: qty 5

## 2015-09-08 MED ORDER — SODIUM CHLORIDE 0.9% FLUSH
20.0000 mL | INTRAVENOUS | Status: DC | PRN
  Administered 2015-09-08: 20 mL via INTRAVENOUS
  Filled 2015-09-08: qty 20

## 2015-09-08 NOTE — Patient Instructions (Signed)
[facility] at [facility] Discharge Instructions  RECOMMENDATIONS MADE BY THE CONSULTANT AND ANY TEST RESULTS WILL BE SENT TO YOUR REFERRING PHYSICIAN.  Port flush today.    Thank you for choosing [facility] at [facility] to provide your oncology and hematology care.  To afford each patient quality time with our provider, please arrive at least 15 minutes before your scheduled appointment time.   Beginning January 23rd 2017 lab work for the Cancer Center will be done in the  Main lab at [place] on 1st floor. If you have a lab appointment with the Cancer Center please come in thru the  Main Entrance and check in at the main information desk  You need to re-schedule your appointment should you arrive 10 or more minutes late.  We strive to give you quality time with our providers, and arriving late affects you and other patients whose appointments are after yours.  Also, if you no show three or more times for appointments you may be dismissed from the clinic at the providers discretion.     Again, thank you for choosing [facility].  Our hope is that these requests will decrease the amount of time that you wait before being seen by our physicians.       _____________________________________________________________  Should you have questions after your visit to  Cancer Center, please contact our office at [phone] between the hours of 8:30 a.m. and 4:30 p.m.  Voicemails left after 4:30 p.m. will not be returned until the following business day.  For prescription refill requests, have your pharmacy contact our office.         Resources For Cancer Patients and their Caregivers ? American [employer] Society: Can assist with transportation, wigs, general needs, runs Look Good Feel Better.        1-888-227-6333 ? Cancer Care: Provides financial assistance, online support groups, medication/co-pay assistance.   1-800-813-HOPE (4673) ? [facility] Assists [place] cancer patients and their families through emotional , educational and financial support.  336-427-4357 ? [place] DSS Where to apply for food stamps, Medicaid and utility assistance. 336-342-1394 ? [facility]: Transportation to medical appointments. 336-347-2287 ? Social Security Administration: May apply for disability if have a Stage IV cancer. 336-342-7796 1-800-772-1213 ? [place] [facility], Dis[facility] Transit Services: Assists with nutrition, care and transit needs. 336-349-2343  

## 2015-09-08 NOTE — Progress Notes (Signed)
[name] presented for Portacath access and flush. Proper placement of portacath confirmed by CXR. Portacath located right chest wall accessed with  H 20 needle. No blood return, but flushed well without any discomfort.   Portacath flushed with 41m NS and 500U/54mHeparin and needle removed intact. Procedure without incident. Patient tolerated procedure well.

## 2015-09-09 ENCOUNTER — Telehealth: Payer: Self-pay | Admitting: Family Medicine

## 2015-09-09 DIAGNOSIS — R531 Weakness: Secondary | ICD-10-CM | POA: Diagnosis not present

## 2015-09-09 DIAGNOSIS — I10 Essential (primary) hypertension: Secondary | ICD-10-CM | POA: Diagnosis not present

## 2015-09-09 DIAGNOSIS — R197 Diarrhea, unspecified: Secondary | ICD-10-CM | POA: Diagnosis not present

## 2015-09-09 DIAGNOSIS — C3490 Malignant neoplasm of unspecified part of unspecified bronchus or lung: Secondary | ICD-10-CM | POA: Diagnosis not present

## 2015-09-09 DIAGNOSIS — R42 Dizziness and giddiness: Secondary | ICD-10-CM | POA: Diagnosis not present

## 2015-09-09 DIAGNOSIS — J449 Chronic obstructive pulmonary disease, unspecified: Secondary | ICD-10-CM | POA: Diagnosis not present

## 2015-09-09 NOTE — Telephone Encounter (Signed)
ok 

## 2015-09-09 NOTE — Telephone Encounter (Signed)
[facility]

## 2015-09-09 NOTE — Telephone Encounter (Signed)
[facility] needing a verbal order to continue physical therapy with patient twice a week for four weeks. [phone].

## 2015-09-10 ENCOUNTER — Ambulatory Visit (INDEPENDENT_AMBULATORY_CARE_PROVIDER_SITE_OTHER): Payer: Medicare Other | Admitting: Family Medicine

## 2015-09-10 ENCOUNTER — Encounter: Payer: Self-pay | Admitting: Family Medicine

## 2015-09-10 VITALS — BP 110/64 | Temp 99.2°F | Ht 67.0 in | Wt 144.0 lb

## 2015-09-10 DIAGNOSIS — J310 Chronic rhinitis: Secondary | ICD-10-CM

## 2015-09-10 DIAGNOSIS — R350 Frequency of micturition: Secondary | ICD-10-CM

## 2015-09-10 DIAGNOSIS — I10 Essential (primary) hypertension: Secondary | ICD-10-CM

## 2015-09-10 DIAGNOSIS — M25512 Pain in left shoulder: Secondary | ICD-10-CM

## 2015-09-10 DIAGNOSIS — J329 Chronic sinusitis, unspecified: Secondary | ICD-10-CM | POA: Diagnosis not present

## 2015-09-10 LAB — POCT URINALYSIS DIPSTICK
Blood, UA: 250
PH UA: 5
PROTEIN UA: 30
Spec Grav, UA: 1.02

## 2015-09-10 MED ORDER — CEFPROZIL 500 MG PO TABS: 500.0000 mg | ORAL_TABLET | Freq: Two times a day (BID) | ORAL | Status: DC

## 2015-09-10 NOTE — Telephone Encounter (Signed)
Verbal order given to [facility]

## 2015-09-10 NOTE — Progress Notes (Signed)
   Subjective:    Patient ID: [name], female    DOB: 22-Apr-1935, 81 y.o.   MRN: [id number] Patient arrives office with numerous concerns Hypertension This is a chronic problem. The current episode started more than 1 year ago.  vasotec reduced at last visit. Compliant with meds still lightheaded at times  Frequent urination started 1 week ago.  Results for orders placed or performed in visit on 09/10/15  POCT urinalysis dipstick  Result Value Ref Range   Color, UA     Clarity, UA     Glucose, UA     Bilirubin, UA +++    Ketones, UA     Spec Grav, UA 1.020    Blood, UA 250    pH, UA 5.0    Protein, UA 30    Urobilinogen, UA     Nitrite, UA     Leukocytes, UA moderate (2+) (A) Negative  positive dysuria no fever  Results for orders placed or performed in visit on 09/10/15  POCT urinalysis dipstick  Result Value Ref Range   Color, UA     Clarity, UA     Glucose, UA     Bilirubin, UA +++    Ketones, UA     Spec Grav, UA 1.020    Blood, UA 250    pH, UA 5.0    Protein, UA 30    Urobilinogen, UA     Nitrite, UA     Leukocytes, UA moderate (2+) (A) Negative    Cough, runny nose, and watery eyes. Cough and runny nose and cough, Started 2 weeks ago.   Left shoulder pain when lifting. Ongoing off and on for awhile.  Painful with extension  Review of Systems No headache, no major weight loss or weight gain, no chest pain no back pain abdominal pain no change in bowel habits complete ROS otherwise negative     Objective:   Physical Exam Alert vital stable blood pressure still low 112 or 70 H&T moderate his congestion frank normal lungs clear heart rare rhythm left shoulder positive impingement sign  Urinalysis numerous white blood cells       Assessment & Plan:  Impression 1 urinary tract infection #2 potential rhinosinusitis #3 left shoulder bursitis element of early frozen shoulder #4 hypertension control 2 type plan stop Vasotec. Antibiotics are. Symptom care  discussed. Tylenol for shoulder exercises discussed follow-up in several months [facility]

## 2015-09-14 DIAGNOSIS — C3490 Malignant neoplasm of unspecified part of unspecified bronchus or lung: Secondary | ICD-10-CM | POA: Diagnosis not present

## 2015-09-14 DIAGNOSIS — R531 Weakness: Secondary | ICD-10-CM | POA: Diagnosis not present

## 2015-09-14 DIAGNOSIS — R42 Dizziness and giddiness: Secondary | ICD-10-CM | POA: Diagnosis not present

## 2015-09-14 DIAGNOSIS — I10 Essential (primary) hypertension: Secondary | ICD-10-CM | POA: Diagnosis not present

## 2015-09-14 DIAGNOSIS — R197 Diarrhea, unspecified: Secondary | ICD-10-CM | POA: Diagnosis not present

## 2015-09-14 DIAGNOSIS — J449 Chronic obstructive pulmonary disease, unspecified: Secondary | ICD-10-CM | POA: Diagnosis not present

## 2015-09-16 DIAGNOSIS — R531 Weakness: Secondary | ICD-10-CM | POA: Diagnosis not present

## 2015-09-16 DIAGNOSIS — R197 Diarrhea, unspecified: Secondary | ICD-10-CM | POA: Diagnosis not present

## 2015-09-16 DIAGNOSIS — J449 Chronic obstructive pulmonary disease, unspecified: Secondary | ICD-10-CM | POA: Diagnosis not present

## 2015-09-16 DIAGNOSIS — R42 Dizziness and giddiness: Secondary | ICD-10-CM | POA: Diagnosis not present

## 2015-09-16 DIAGNOSIS — I10 Essential (primary) hypertension: Secondary | ICD-10-CM | POA: Diagnosis not present

## 2015-09-16 DIAGNOSIS — C3490 Malignant neoplasm of unspecified part of unspecified bronchus or lung: Secondary | ICD-10-CM | POA: Diagnosis not present

## 2015-09-21 ENCOUNTER — Other Ambulatory Visit: Payer: Self-pay | Admitting: Family Medicine

## 2015-09-21 DIAGNOSIS — R42 Dizziness and giddiness: Secondary | ICD-10-CM | POA: Diagnosis not present

## 2015-09-21 DIAGNOSIS — R531 Weakness: Secondary | ICD-10-CM | POA: Diagnosis not present

## 2015-09-21 DIAGNOSIS — R197 Diarrhea, unspecified: Secondary | ICD-10-CM | POA: Diagnosis not present

## 2015-09-21 DIAGNOSIS — J449 Chronic obstructive pulmonary disease, unspecified: Secondary | ICD-10-CM | POA: Diagnosis not present

## 2015-09-21 DIAGNOSIS — C3490 Malignant neoplasm of unspecified part of unspecified bronchus or lung: Secondary | ICD-10-CM | POA: Diagnosis not present

## 2015-09-21 DIAGNOSIS — I10 Essential (primary) hypertension: Secondary | ICD-10-CM | POA: Diagnosis not present

## 2015-09-24 ENCOUNTER — Telehealth: Payer: Self-pay | Admitting: Family Medicine

## 2015-09-24 DIAGNOSIS — R197 Diarrhea, unspecified: Secondary | ICD-10-CM | POA: Diagnosis not present

## 2015-09-24 DIAGNOSIS — C3490 Malignant neoplasm of unspecified part of unspecified bronchus or lung: Secondary | ICD-10-CM | POA: Diagnosis not present

## 2015-09-24 DIAGNOSIS — J449 Chronic obstructive pulmonary disease, unspecified: Secondary | ICD-10-CM | POA: Diagnosis not present

## 2015-09-24 DIAGNOSIS — R42 Dizziness and giddiness: Secondary | ICD-10-CM | POA: Diagnosis not present

## 2015-09-24 DIAGNOSIS — I10 Essential (primary) hypertension: Secondary | ICD-10-CM | POA: Diagnosis not present

## 2015-09-24 DIAGNOSIS — R531 Weakness: Secondary | ICD-10-CM | POA: Diagnosis not present

## 2015-09-24 MED ORDER — AMOXICILLIN 500 MG PO CAPS: 500.0000 mg | ORAL_CAPSULE | Freq: Three times a day (TID) | ORAL | Status: DC

## 2015-09-24 NOTE — Telephone Encounter (Signed)
Pt's daughter called stating that the home health agency wanted her to call her pcp and have them decrease her o2 level. Pt is currently at 3L and is staying at 100%. Agency states that 3L is too much. Pt is also running a low grade temp and daughter wants to know if antibiotic can be called in.  Please advise.

## 2015-09-24 NOTE — Telephone Encounter (Signed)
Notified daughter med was sent to pharmacy and decrease O2 to 2 L. Daughter verbalized understanding.

## 2015-09-24 NOTE — Telephone Encounter (Signed)
A mocks 500 3 times a day for 10 days decrease O2 to 2 L

## 2015-09-24 NOTE — Telephone Encounter (Signed)
Daughter states that patient is having a low grade fever (99.2). No other symptoms at all. Daughter states that patient recently completed antibiotics for a UTI and she was wondering if an antibiotic can be sent in for preventative measures.

## 2015-09-28 DIAGNOSIS — J449 Chronic obstructive pulmonary disease, unspecified: Secondary | ICD-10-CM | POA: Diagnosis not present

## 2015-09-28 DIAGNOSIS — R531 Weakness: Secondary | ICD-10-CM | POA: Diagnosis not present

## 2015-09-28 DIAGNOSIS — R42 Dizziness and giddiness: Secondary | ICD-10-CM | POA: Diagnosis not present

## 2015-09-28 DIAGNOSIS — I10 Essential (primary) hypertension: Secondary | ICD-10-CM | POA: Diagnosis not present

## 2015-09-28 DIAGNOSIS — C3490 Malignant neoplasm of unspecified part of unspecified bronchus or lung: Secondary | ICD-10-CM | POA: Diagnosis not present

## 2015-09-28 DIAGNOSIS — R197 Diarrhea, unspecified: Secondary | ICD-10-CM | POA: Diagnosis not present

## 2015-09-29 DIAGNOSIS — R531 Weakness: Secondary | ICD-10-CM | POA: Diagnosis not present

## 2015-09-29 DIAGNOSIS — J449 Chronic obstructive pulmonary disease, unspecified: Secondary | ICD-10-CM | POA: Diagnosis not present

## 2015-09-29 DIAGNOSIS — R42 Dizziness and giddiness: Secondary | ICD-10-CM | POA: Diagnosis not present

## 2015-09-29 DIAGNOSIS — R197 Diarrhea, unspecified: Secondary | ICD-10-CM | POA: Diagnosis not present

## 2015-09-29 DIAGNOSIS — I10 Essential (primary) hypertension: Secondary | ICD-10-CM | POA: Diagnosis not present

## 2015-09-29 DIAGNOSIS — C3490 Malignant neoplasm of unspecified part of unspecified bronchus or lung: Secondary | ICD-10-CM | POA: Diagnosis not present

## 2015-10-01 DIAGNOSIS — I10 Essential (primary) hypertension: Secondary | ICD-10-CM | POA: Diagnosis not present

## 2015-10-01 DIAGNOSIS — J449 Chronic obstructive pulmonary disease, unspecified: Secondary | ICD-10-CM | POA: Diagnosis not present

## 2015-10-01 DIAGNOSIS — R531 Weakness: Secondary | ICD-10-CM | POA: Diagnosis not present

## 2015-10-01 DIAGNOSIS — R42 Dizziness and giddiness: Secondary | ICD-10-CM | POA: Diagnosis not present

## 2015-10-01 DIAGNOSIS — C3490 Malignant neoplasm of unspecified part of unspecified bronchus or lung: Secondary | ICD-10-CM | POA: Diagnosis not present

## 2015-10-01 DIAGNOSIS — R197 Diarrhea, unspecified: Secondary | ICD-10-CM | POA: Diagnosis not present

## 2015-10-04 ENCOUNTER — Ambulatory Visit (HOSPITAL_COMMUNITY)
Admission: RE | Admit: 2015-10-04 | Discharge: 2015-10-04 | Disposition: A | Discharge status: Home / Self Care | Payer: Medicare Other | Source: Ambulatory Visit | Attending: Hematology & Oncology | Admitting: Hematology & Oncology

## 2015-10-04 ENCOUNTER — Other Ambulatory Visit (HOSPITAL_COMMUNITY): Payer: Self-pay | Admitting: Hematology & Oncology

## 2015-10-04 DIAGNOSIS — C3492 Malignant neoplasm of unspecified part of left bronchus or lung: Secondary | ICD-10-CM | POA: Insufficient documentation

## 2015-10-04 DIAGNOSIS — Z79899 Other long term (current) drug therapy: Secondary | ICD-10-CM | POA: Insufficient documentation

## 2015-10-04 DIAGNOSIS — C3490 Malignant neoplasm of unspecified part of unspecified bronchus or lung: Secondary | ICD-10-CM | POA: Diagnosis not present

## 2015-10-04 DIAGNOSIS — J90 Pleural effusion, not elsewhere classified: Secondary | ICD-10-CM | POA: Insufficient documentation

## 2015-10-04 DIAGNOSIS — K769 Liver disease, unspecified: Secondary | ICD-10-CM | POA: Insufficient documentation

## 2015-10-04 LAB — POCT I-STAT CREATININE: CREATININE: 1.9 mg/dL — AB (ref 0.44–1.00)

## 2015-10-04 MED ORDER — IOPAMIDOL (ISOVUE-300) INJECTION 61%: 80.0000 mL | Freq: Once | INTRAVENOUS | Status: DC | PRN

## 2015-10-05 ENCOUNTER — Inpatient Hospital Stay (HOSPITAL_COMMUNITY)
Admission: EM | Admit: 2015-10-05 | Discharge: 2015-10-11 | DRG: 445 | Disposition: A | Discharge status: Home Health | Payer: Medicare Other | Attending: Internal Medicine | Admitting: Internal Medicine

## 2015-10-05 ENCOUNTER — Emergency Department (HOSPITAL_COMMUNITY): Payer: Medicare Other

## 2015-10-05 ENCOUNTER — Encounter (HOSPITAL_COMMUNITY): Payer: Self-pay | Admitting: Emergency Medicine

## 2015-10-05 DIAGNOSIS — R197 Diarrhea, unspecified: Secondary | ICD-10-CM | POA: Diagnosis not present

## 2015-10-05 DIAGNOSIS — E785 Hyperlipidemia, unspecified: Secondary | ICD-10-CM | POA: Diagnosis present

## 2015-10-05 DIAGNOSIS — Z8249 Family history of ischemic heart disease and other diseases of the circulatory system: Secondary | ICD-10-CM

## 2015-10-05 DIAGNOSIS — I2510 Atherosclerotic heart disease of native coronary artery without angina pectoris: Secondary | ICD-10-CM | POA: Diagnosis present

## 2015-10-05 DIAGNOSIS — Z886 Allergy status to analgesic agent status: Secondary | ICD-10-CM

## 2015-10-05 DIAGNOSIS — K810 Acute cholecystitis: Secondary | ICD-10-CM | POA: Diagnosis not present

## 2015-10-05 DIAGNOSIS — Z9981 Dependence on supplemental oxygen: Secondary | ICD-10-CM

## 2015-10-05 DIAGNOSIS — D3502 Benign neoplasm of left adrenal gland: Secondary | ICD-10-CM | POA: Diagnosis present

## 2015-10-05 DIAGNOSIS — J449 Chronic obstructive pulmonary disease, unspecified: Secondary | ICD-10-CM | POA: Diagnosis present

## 2015-10-05 DIAGNOSIS — I10 Essential (primary) hypertension: Secondary | ICD-10-CM | POA: Diagnosis not present

## 2015-10-05 DIAGNOSIS — R1031 Right lower quadrant pain: Secondary | ICD-10-CM | POA: Diagnosis not present

## 2015-10-05 DIAGNOSIS — Z888 Allergy status to other drugs, medicaments and biological substances status: Secondary | ICD-10-CM

## 2015-10-05 DIAGNOSIS — I739 Peripheral vascular disease, unspecified: Secondary | ICD-10-CM | POA: Diagnosis present

## 2015-10-05 DIAGNOSIS — K819 Cholecystitis, unspecified: Secondary | ICD-10-CM

## 2015-10-05 DIAGNOSIS — K8000 Calculus of gallbladder with acute cholecystitis without obstruction: Secondary | ICD-10-CM | POA: Diagnosis not present

## 2015-10-05 DIAGNOSIS — K219 Gastro-esophageal reflux disease without esophagitis: Secondary | ICD-10-CM | POA: Diagnosis present

## 2015-10-05 DIAGNOSIS — Z881 Allergy status to other antibiotic agents status: Secondary | ICD-10-CM

## 2015-10-05 DIAGNOSIS — F0390 Unspecified dementia without behavioral disturbance: Secondary | ICD-10-CM | POA: Diagnosis present

## 2015-10-05 DIAGNOSIS — C3490 Malignant neoplasm of unspecified part of unspecified bronchus or lung: Secondary | ICD-10-CM | POA: Diagnosis not present

## 2015-10-05 DIAGNOSIS — K760 Fatty (change of) liver, not elsewhere classified: Secondary | ICD-10-CM | POA: Diagnosis present

## 2015-10-05 DIAGNOSIS — Z79899 Other long term (current) drug therapy: Secondary | ICD-10-CM

## 2015-10-05 DIAGNOSIS — Z87891 Personal history of nicotine dependence: Secondary | ICD-10-CM

## 2015-10-05 DIAGNOSIS — F03B0 Unspecified dementia, moderate, without behavioral disturbance, psychotic disturbance, mood disturbance, and anxiety: Secondary | ICD-10-CM | POA: Diagnosis present

## 2015-10-05 DIAGNOSIS — C787 Secondary malignant neoplasm of liver and intrahepatic bile duct: Secondary | ICD-10-CM | POA: Diagnosis not present

## 2015-10-05 DIAGNOSIS — Z885 Allergy status to narcotic agent status: Secondary | ICD-10-CM

## 2015-10-05 DIAGNOSIS — C3492 Malignant neoplasm of unspecified part of left bronchus or lung: Secondary | ICD-10-CM | POA: Diagnosis present

## 2015-10-05 DIAGNOSIS — E039 Hypothyroidism, unspecified: Secondary | ICD-10-CM | POA: Diagnosis present

## 2015-10-05 DIAGNOSIS — K8020 Calculus of gallbladder without cholecystitis without obstruction: Secondary | ICD-10-CM | POA: Diagnosis not present

## 2015-10-05 DIAGNOSIS — Z7982 Long term (current) use of aspirin: Secondary | ICD-10-CM

## 2015-10-05 DIAGNOSIS — K589 Irritable bowel syndrome without diarrhea: Secondary | ICD-10-CM | POA: Diagnosis present

## 2015-10-05 DIAGNOSIS — E440 Moderate protein-calorie malnutrition: Secondary | ICD-10-CM | POA: Diagnosis not present

## 2015-10-05 DIAGNOSIS — G4700 Insomnia, unspecified: Secondary | ICD-10-CM | POA: Diagnosis present

## 2015-10-05 DIAGNOSIS — R42 Dizziness and giddiness: Secondary | ICD-10-CM | POA: Diagnosis not present

## 2015-10-05 DIAGNOSIS — R531 Weakness: Secondary | ICD-10-CM | POA: Diagnosis not present

## 2015-10-05 LAB — COMPREHENSIVE METABOLIC PANEL
ALT: 10 U/L — ABNORMAL LOW (ref 14–54)
AST: 26 U/L (ref 15–41)
Albumin: 4.3 g/dL (ref 3.5–5.0)
Alkaline Phosphatase: 36 U/L — ABNORMAL LOW (ref 38–126)
Anion gap: 10 (ref 5–15)
BUN: 15 mg/dL (ref 6–20)
CO2: 24 mmol/L (ref 22–32)
Calcium: 9.3 mg/dL (ref 8.9–10.3)
Chloride: 105 mmol/L (ref 101–111)
Creatinine, Ser: 1.83 mg/dL — ABNORMAL HIGH (ref 0.44–1.00)
GFR calc Af Amer: 29 mL/min — ABNORMAL LOW (ref >60)
GFR calc non Af Amer: 25 mL/min — ABNORMAL LOW (ref >60)
Glucose, Bld: 122 mg/dL — ABNORMAL HIGH (ref 65–99)
Potassium: 3.6 mmol/L (ref 3.5–5.1)
Sodium: 139 mmol/L (ref 135–145)
Total Bilirubin: 0.4 mg/dL (ref 0.3–1.2)
Total Protein: 7.1 g/dL (ref 6.5–8.1)

## 2015-10-05 LAB — CBC WITH DIFFERENTIAL/PLATELET
Basophils Absolute: 0 10*3/uL (ref 0.0–0.1)
Basophils Relative: 0 %
Eosinophils Absolute: 0.2 10*3/uL (ref 0.0–0.7)
Eosinophils Relative: 3 %
HCT: 30.8 % — ABNORMAL LOW (ref 36.0–46.0)
Hemoglobin: 10.1 g/dL — ABNORMAL LOW (ref 12.0–15.0)
Lymphocytes Relative: 10 %
Lymphs Abs: 0.8 10*3/uL (ref 0.7–4.0)
MCH: 33.8 pg (ref 26.0–34.0)
MCHC: 32.8 g/dL (ref 30.0–36.0)
MCV: 103 fL — ABNORMAL HIGH (ref 78.0–100.0)
Monocytes Absolute: 0.5 10*3/uL (ref 0.1–1.0)
Monocytes Relative: 7 %
Neutro Abs: 6.2 10*3/uL (ref 1.7–7.7)
Neutrophils Relative %: 80 %
Platelets: 332 10*3/uL (ref 150–400)
RBC: 2.99 MIL/uL — ABNORMAL LOW (ref 3.87–5.11)
RDW: 14.2 % (ref 11.5–15.5)
WBC: 7.7 10*3/uL (ref 4.0–10.5)

## 2015-10-05 LAB — LIPASE, BLOOD: Lipase: 32 U/L (ref 11–51)

## 2015-10-05 MED ORDER — SODIUM CHLORIDE 0.9 % IV BOLUS (SEPSIS)
500.0000 mL | Freq: Once | INTRAVENOUS | Status: AC
  Administered 2015-10-05: 500 mL via INTRAVENOUS

## 2015-10-05 MED ORDER — ONDANSETRON HCL 4 MG/2ML IJ SOLN
4.0000 mg | Freq: Once | INTRAMUSCULAR | Status: AC
  Administered 2015-10-05: 4 mg via INTRAVENOUS
  Filled 2015-10-05: qty 2

## 2015-10-05 MED ORDER — MORPHINE SULFATE (PF) 4 MG/ML IV SOLN
4.0000 mg | Freq: Once | INTRAVENOUS | Status: AC
  Administered 2015-10-05: 4 mg via INTRAVENOUS
  Filled 2015-10-05: qty 1

## 2015-10-05 NOTE — ED Notes (Signed)
Patient complaining of abdominal pain upon awakening this morning. States she vomited x 1 and diarrhea x 3 "but I had to drink that stuff yesterday for a CT."

## 2015-10-05 NOTE — ED Provider Notes (Addendum)
CSN: [id number]     Arrival date & time 10/05/15  2047 History  By signing my name below, I, Nicole Kindred, attest that this documentation has been prepared under the direction and in the presence of Virgel Manifold, MD.   Electronically Signed: Nicole Kindred, ED Scribe. 10/05/2015. 9:22 PM   Chief Complaint  Patient presents with  . Abdominal Pain    The history is provided by the patient. No language interpreter was used.   HPI Comments: [name] is a 81 y.o. female with PSHx colon resection and abdominal hysterectomy who presents to the Emergency Department complaining of gradual onset, constant, dull, right sided abdominal pain, onset this morning. Pt reports associated chills, nausea, vomiting x1 episode, and diarrhea x3 episodes. She had a CT with contrast yesterday and believes it may have contributed to her symptoms. No other associated symptoms noted. No worsening or alleviating factors noted. Pt denies difficulty urinating, change in bowel movements, bloating, hematochezia, melena, or any other pertinent symptoms.    Past Medical History  Diagnosis Date  . Hypertension   . Hypothyroidism   . Hyperlipidemia   . Insomnia   . GERD (gastroesophageal reflux disease)     chronic gastritis  . Adrenal adenoma   . Chronic diarrhea   . Fatty liver   . Arthritis     knee  . QT prolongation     syncope  . Coronary artery disease   . Impaired fasting glucose   . Renal insufficiency     low protien diet  . Tobacco use     1/2 ppd, approx 25-50 pack years (as of 08/2012)  . Family history of heart disease   . IBS (irritable bowel syndrome)   . Peripheral arterial disease ([place])     sstatus post  infrarenal abdominal aortic tube graft placed by Dr. Victorino Dike February 2008  . Cancer ([place])     Lung  . Squamous cell carcinoma of left lung ([place]) 07/29/2013  . HCAP (healthcare-associated pneumonia)     10/2014  . Acute respiratory failure ([place])     10/2014  . COPD (chronic  obstructive pulmonary disease) ([place])     moderat  . Pleural effusion    Past Surgical History  Procedure Laterality Date  . Abdominal hysterectomy    . Thyroidectomy, partial    . Colonoscopy  5/04    normal  . Abdominal aortic aneurysm repair  07/2006    D. J.D. Kellie Simmering  . Cataract extraction Bilateral 2010  . Cardiac catheterization  02/2010    mod CAD in L-dominant system with normal LV function  . Colon resection  2005    1/2 colon removed  . Transthoracic echocardiogram  02/2010    EF 55-60%; mild conc LVH, normal systolic function; mildly calcified AV annulus  . Colonoscopy with esophagogastroduodenoscopy (egd) N/A 03/19/2013    Procedure: COLONOSCOPY WITH ESOPHAGOGASTRODUODENOSCOPY (EGD);  Surgeon: Rogene Houston, MD;  Location: [facility];  Service: Endoscopy;  Laterality: N/A;  145  . Dg biopsy lung Left Jan 2015   Family History  Problem Relation Age of Onset  . Hypertension Mother   . Diabetes Mother   . Heart attack Mother   . Hyperlipidemia Sister     x3 sister  . Kidney disease Brother    Social History  Substance Use Topics  . Smoking status: Former Smoker -- 0.50 packs/day for 59 years    Types: Cigarettes  . Smokeless tobacco: Never Used  Comment: smoking since [age]  . Alcohol Use: No   OB History    Gravida Para Term Preterm AB TAB SAB Ectopic Multiple Living   '2 2 2            '$ Review of Systems A complete 10 system review of systems was obtained and all systems are negative except as noted in the HPI and PMH.    Allergies  Aleve; Dilaudid; Dyazide; and Zithromax  Home Medications   Prior to Admission medications   Medication Sig Start Date End Date Taking? Authorizing Provider  allopurinol (ZYLOPRIM) 300 MG tablet take 1 tablet by mouth once daily 04/21/15   Mikey Kirschner, MD  ALPRAZolam Duanne Moron) 1 MG tablet TAKE ONE-HALF TO ONE TABLET BY MOUTH THREE TIMES DAILY AS NEEDED FOR ANXIETY 08/02/15   Mikey Kirschner, MD  amLODipine  (NORVASC) 5 MG tablet take 1 tablet by mouth once daily 06/29/15   Kathyrn Drown, MD  amoxicillin (AMOXIL) 500 MG capsule Take 1 capsule (500 mg total) by mouth 3 (three) times daily. X 10 days 09/24/15   Mikey Kirschner, MD  aspirin EC 81 MG tablet Take 81 mg by mouth daily.    Historical Provider, MD  cefPROZIL (CEFZIL) 500 MG tablet Take 1 tablet (500 mg total) by mouth 2 (two) times daily. 09/10/15   Mikey Kirschner, MD  donepezil (ARICEPT) 10 MG tablet Take 1 tablet daily 08/02/15   Cameron Sprang, MD  DULoxetine (CYMBALTA) 60 MG capsule take 1 capsule by mouth once daily 08/30/15   Mikey Kirschner, MD  fenofibrate 160 MG tablet take 1 tablet by mouth once daily with food 09/21/15   Mikey Kirschner, MD  ferrous sulfate (FERROUSUL) 325 (65 FE) MG tablet Take 1 tablet (325 mg total) by mouth 2 (two) times daily after a meal. 03/19/13   Rogene Houston, MD  folic acid (FOLVITE) 1 MG tablet Take 1 tablet (1 mg total) by mouth daily. 08/03/15   Mikey Kirschner, MD  hydrALAZINE (APRESOLINE) 25 MG tablet take 1 tablet by mouth three times a day 04/26/15   Mikey Kirschner, MD  HYDROcodone-acetaminophen (NORCO/VICODIN) 5-325 MG tablet Take 1 tablet twice a day as needed for pain Patient taking differently: Take 1 tablet by mouth 2 (two) times daily as needed for moderate pain or severe pain. Take 1 tablet twice a day as needed for pain 06/14/15   Mikey Kirschner, MD  levothyroxine Wilmer Floor, LEVOTHROID) 137 MCG tablet take 1 tablet by mouth once daily 04/23/15   Mikey Kirschner, MD  lidocaine-prilocaine (EMLA) cream Apply 1 application topically as needed (port).    Historical Provider, MD  loperamide (IMODIUM) 2 MG capsule Take 4 mg by mouth as needed for diarrhea or loose stools.    Historical Provider, MD  meclizine (ANTIVERT) 25 MG tablet Take 1 tablet (25 mg total) by mouth 3 (three) times daily as needed for dizziness. 08/19/15   Mikey Kirschner, MD  Menthol-Methyl Salicylate (MUSCLE RUB) 10-15 %  CREA Apply 1 application topically as needed for muscle pain.    Historical Provider, MD  ondansetron (ZOFRAN-ODT) 4 MG disintegrating tablet dissolve 1 tablet ON TONGUE every 8 hours if needed for nausea and vomiting 08/30/15   Mikey Kirschner, MD  pantoprazole (PROTONIX) 40 MG tablet Take 1 tablet (40 mg total) by mouth daily. 07/22/15   Mikey Kirschner, MD  potassium chloride (K-DUR) 10 MEQ tablet Take 2 tabs daily  Patient taking differently: Take 20 mEq by mouth daily. Take 2 tabs daily 04/08/15   Mikey Kirschner, MD  pravastatin (PRAVACHOL) 20 MG tablet take 1 tablet by mouth once daily 05/04/15   Mikey Kirschner, MD  QUEtiapine (SEROQUEL) 25 MG tablet Take 1 tablet (25 mg total) by mouth at bedtime. 08/02/15   Cameron Sprang, MD  SPIRIVA HANDIHALER 18 MCG inhalation capsule Place 18 mcg into inhaler and inhale as needed.  11/14/14   Historical Provider, MD  traMADol (ULTRAM) 50 MG tablet take 1 tablet by mouth every 6 hours if needed Patient taking differently: Take 50 mg by mouth every 6 (six) hours as needed for moderate pain.  06/14/15   Mikey Kirschner, MD  Vitamin D, Ergocalciferol, (DRISDOL) 50000 UNITS CAPS capsule Take 1 capsule (50,000 Units total) by mouth once a week. 06/14/15   Mikey Kirschner, MD   BP 139/76 mmHg  Pulse 93  Temp(Src) 99.1 F (37.3 C) (Oral)  Resp 20  Ht '5\' 6"'$  (1.676 m)  Wt 146 lb (66.225 kg)  BMI 23.58 kg/m2  SpO2 100% Physical Exam  Constitutional: She appears well-developed and well-nourished.  Uncomfortable appearing.   HENT:  Head: Normocephalic and atraumatic.  Eyes: Conjunctivae and EOM are normal.  Neck: Neck supple. No tracheal deviation present.  Cardiovascular: Normal rate.   Pulmonary/Chest: Effort normal. No respiratory distress.  Abdominal: She exhibits no distension and no mass. There is tenderness. There is guarding. There is no rebound.  Right sided abdominal TTP. Worst in RUQ, with voluntary guarding. No rebound. No distension.    Musculoskeletal: Normal range of motion.  Neurological: She is alert.  Skin: Skin is warm and dry.  Psychiatric: She has a normal mood and affect. Her behavior is normal.    ED Course  Procedures (including critical care time) DIAGNOSTIC STUDIES: Oxygen Saturation is 100% on nasal canula, normal by my interpretation.    COORDINATION OF CARE: 9:37 PM-Discussed treatment plan with pt at bedside and pt agreed to plan.   Labs Review Labs Reviewed - No data to display  Imaging Review Ct Abdomen Pelvis Wo Contrast  10/06/2015  CLINICAL DATA:  Dull right-sided abdominal pain, onset this morning. Nausea and chills. EXAM: CT ABDOMEN AND PELVIS WITHOUT CONTRAST TECHNIQUE: Multidetector CT imaging of the abdomen and pelvis was performed following the standard protocol without IV contrast. COMPARISON:  10/04/2015 FINDINGS: The gallbladder is distended, and there is an 11 mm calculus in the gallbladder neck. There is mild stranding around the gallbladder and a suggestion of gallbladder mural thickening. The findings may represent acute cholecystitis. No extrahepatic bile duct dilatation. There is mild right hydronephrosis without visible ureteral calculus. Left collecting system is mildly fold but not frankly hydronephrotic. Otherwise unremarkable appearances of the kidneys. No urinary calculi are evident. 2.7 cm low-attenuation liver lesion in the left lobe anteriorly, as well as a 1.7 cm low-attenuation lesion in the right hepatic lobe superiorly. These are indeterminate. There are unremarkable unenhanced appearances of the spleen and pancreas. There is a 2.8 cm left adrenal nodule. Extensive prior bowel surgery. No evidence of bowel obstruction. No extraluminal air. Abdominal aortic graft, appearing intact on this unenhanced scan. No significant skeletal lesion. Unchanged consolidation in the left lung base medially. IMPRESSION: 1. New gallbladder distention, mural thickening and adjacent stranding.  Cholelithiasis. This may represent acute cholecystitis. Recommend right upper quadrant sonography to evaluate. 2. Mild right hydronephrosis and mild fullness of the left renal collecting system. No ureteral calculi  are evident. 3. Low-attenuation liver lesions, not characterized. Left adrenal nodule, not characterized. Prior studies not currently available to assess stability. Electronically Signed   By: Andreas Newport M.D.   On: 10/06/2015 00:36   Ct Abdomen Pelvis Wo Contrast  10/04/2015  CLINICAL DATA:  Lung cancer.  Chemotherapy and radiation therapy. EXAM: CT CHEST, ABDOMEN AND PELVIS WITHOUT CONTRAST TECHNIQUE: Multidetector CT imaging of the chest, abdomen and pelvis was performed following the standard protocol without IV contrast. COMPARISON:  CT chest 04/13/2015 and CT abdomen pelvis 08/27/2014. FINDINGS: CT CHEST FINDINGS Mediastinum/Lymph Nodes: Right IJ Port-A-Cath terminates in the SVC. No pathologically enlarged mediastinal or axillary lymph nodes. Hilar regions are difficult to definitively evaluate without IV contrast. Pulmonary arteries are enlarged. Coronary artery calcification. Heart is at the upper limits of normal in size to mildly enlarged. Small amount of pericardial fluid may be physiologic. Pre pericardiac lymph nodes are sub cm in short axis size. Lungs/Pleura: Minimal biapical pleural parenchymal scarring. Moderate centrilobular emphysema. Minimal paraseptal emphysema. Post treatment collapse/ consolidation bronchiectasis in the medial aspect of the left hemi thorax. Small left pleural effusion, slightly decreased. No right pleural fluid. There is narrowing of the left lower lobe bronchi. Airway is otherwise unremarkable. Musculoskeletal: No worrisome lytic or sclerotic lesions. CT ABDOMEN PELVIS FINDINGS Hepatobiliary: Intermediate attenuation lesion in segment 4 of the liver measures 2.9 cm (2/55), previously 1.9 cm on 08/27/2014 and 11 mm on 10/24/2013. 2.0 cm low-attenuation  lesion in the dome of the liver (2/48), stable. Stone is seen in the gallbladder. No biliary ductal dilatation. Pancreas: Negative. Spleen: Negative. Adrenals/Urinary Tract: Right adrenal gland is unremarkable. Left adrenal mass measures 2.5 x 2.9 cm and is fluid in density, stable. Kidneys are unremarkable. Ureters are decompressed. Bladder is somewhat low in volume. Stomach/Bowel: Stomach and proximal small bowel are unremarkable. Subtotal colectomy. Rectosigmoid colon is unremarkable. Vascular/Lymphatic: Atherosclerotic calcification of the arterial vasculature with an infrarenal aortic aneurysm repair. Infrarenal aorta measures up to 3.0 cm, stable. Retroperitoneal lymph nodes measure up to 10 mm in the left periaortic station, previously 7 mm. Reproductive: Hysterectomy.  Ovaries are atrophic. Other: Scattered surgical clips in the abdomen. Mesenteries and peritoneum are otherwise unremarkable. No free fluid. Musculoskeletal: No worrisome lytic or sclerotic lesions. Degenerative changes are seen in the spine. IMPRESSION: 1. Enlarging intermediate density segment 4 liver lesion. Metastatic disease cannot be excluded. Consider further evaluation with repeat MR abdomen (preferably without and with contrast), as clinically indicated. 2. Posttreatment changes in the left hemi thorax. Associated small left pleural effusion, slightly decreased. 3. Enlarged pulmonary arteries, indicative of pulmonary arterial hypertension. 4. Coronary artery calcification. 5. Cholelithiasis. 6. Left adrenal adenoma. Electronically Signed   By: Lorin Picket M.D.   On: 10/04/2015 13:57   Ct Chest Wo Contrast  10/04/2015  CLINICAL DATA:  Lung cancer.  Chemotherapy and radiation therapy. EXAM: CT CHEST, ABDOMEN AND PELVIS WITHOUT CONTRAST TECHNIQUE: Multidetector CT imaging of the chest, abdomen and pelvis was performed following the standard protocol without IV contrast. COMPARISON:  CT chest 04/13/2015 and CT abdomen pelvis  08/27/2014. FINDINGS: CT CHEST FINDINGS Mediastinum/Lymph Nodes: Right IJ Port-A-Cath terminates in the SVC. No pathologically enlarged mediastinal or axillary lymph nodes. Hilar regions are difficult to definitively evaluate without IV contrast. Pulmonary arteries are enlarged. Coronary artery calcification. Heart is at the upper limits of normal in size to mildly enlarged. Small amount of pericardial fluid may be physiologic. Pre pericardiac lymph nodes are sub cm in short axis size. Lungs/Pleura: Minimal biapical  pleural parenchymal scarring. Moderate centrilobular emphysema. Minimal paraseptal emphysema. Post treatment collapse/ consolidation bronchiectasis in the medial aspect of the left hemi thorax. Small left pleural effusion, slightly decreased. No right pleural fluid. There is narrowing of the left lower lobe bronchi. Airway is otherwise unremarkable. Musculoskeletal: No worrisome lytic or sclerotic lesions. CT ABDOMEN PELVIS FINDINGS Hepatobiliary: Intermediate attenuation lesion in segment 4 of the liver measures 2.9 cm (2/55), previously 1.9 cm on 08/27/2014 and 11 mm on 10/24/2013. 2.0 cm low-attenuation lesion in the dome of the liver (2/48), stable. Stone is seen in the gallbladder. No biliary ductal dilatation. Pancreas: Negative. Spleen: Negative. Adrenals/Urinary Tract: Right adrenal gland is unremarkable. Left adrenal mass measures 2.5 x 2.9 cm and is fluid in density, stable. Kidneys are unremarkable. Ureters are decompressed. Bladder is somewhat low in volume. Stomach/Bowel: Stomach and proximal small bowel are unremarkable. Subtotal colectomy. Rectosigmoid colon is unremarkable. Vascular/Lymphatic: Atherosclerotic calcification of the arterial vasculature with an infrarenal aortic aneurysm repair. Infrarenal aorta measures up to 3.0 cm, stable. Retroperitoneal lymph nodes measure up to 10 mm in the left periaortic station, previously 7 mm. Reproductive: Hysterectomy.  Ovaries are atrophic.  Other: Scattered surgical clips in the abdomen. Mesenteries and peritoneum are otherwise unremarkable. No free fluid. Musculoskeletal: No worrisome lytic or sclerotic lesions. Degenerative changes are seen in the spine. IMPRESSION: 1. Enlarging intermediate density segment 4 liver lesion. Metastatic disease cannot be excluded. Consider further evaluation with repeat MR abdomen (preferably without and with contrast), as clinically indicated. 2. Posttreatment changes in the left hemi thorax. Associated small left pleural effusion, slightly decreased. 3. Enlarged pulmonary arteries, indicative of pulmonary arterial hypertension. 4. Coronary artery calcification. 5. Cholelithiasis. 6. Left adrenal adenoma. Electronically Signed   By: Lorin Picket M.D.   On: 10/04/2015 13:57   I have personally reviewed and evaluated these images and lab results as part of my medical decision-making.   EKG Interpretation None      MDM   Final diagnoses:  Calculus of gallbladder with acute cholecystitis without obstruction    80yF with R sided abdominal pain and n/v.  -CT with distended GB, 1.1cm stone in neck and some surrounding stranding. -LFTS/lipase normal. No leukocytosis. Afebrile. -Feels better after pain meds, but still very tender on exam. -Abx.  -Will discuss with surgery.   I personally preformed the services scribed in my presence. The recorded information has been reviewed is accurate. Virgel Manifold, MD.   1:23 AM Discussed with Dr Kieth Brightly, surgery at [facility]. With numerous comorbidities, he is requesting medical admission at [facility] and surgery will see in consultation.   Virgel Manifold, MD 10/06/15 [phone]

## 2015-10-05 NOTE — ED Notes (Signed)
toCT via stretcher

## 2015-10-05 NOTE — ED Notes (Signed)
Pt had a CT yesterday with contrast. Has an appt with her oncologist next Monday- She reports lower abd pain today. Wonders if it is contrast or other. She reports a power port that cannot be accessed so IV established to her [facility] with labs drawn

## 2015-10-05 NOTE — ED Notes (Signed)
Physician in to assess pt

## 2015-10-06 ENCOUNTER — Other Ambulatory Visit: Payer: Self-pay

## 2015-10-06 ENCOUNTER — Observation Stay (HOSPITAL_COMMUNITY): Payer: Medicare Other

## 2015-10-06 ENCOUNTER — Encounter (HOSPITAL_COMMUNITY): Payer: Self-pay | Admitting: General Practice

## 2015-10-06 DIAGNOSIS — R1011 Right upper quadrant pain: Secondary | ICD-10-CM | POA: Diagnosis not present

## 2015-10-06 DIAGNOSIS — F0390 Unspecified dementia without behavioral disturbance: Secondary | ICD-10-CM | POA: Diagnosis not present

## 2015-10-06 DIAGNOSIS — Z7982 Long term (current) use of aspirin: Secondary | ICD-10-CM | POA: Diagnosis not present

## 2015-10-06 DIAGNOSIS — R1031 Right lower quadrant pain: Secondary | ICD-10-CM | POA: Diagnosis not present

## 2015-10-06 DIAGNOSIS — E440 Moderate protein-calorie malnutrition: Secondary | ICD-10-CM | POA: Diagnosis not present

## 2015-10-06 DIAGNOSIS — K8000 Calculus of gallbladder with acute cholecystitis without obstruction: Secondary | ICD-10-CM | POA: Diagnosis not present

## 2015-10-06 DIAGNOSIS — Z885 Allergy status to narcotic agent status: Secondary | ICD-10-CM | POA: Diagnosis not present

## 2015-10-06 DIAGNOSIS — K219 Gastro-esophageal reflux disease without esophagitis: Secondary | ICD-10-CM | POA: Diagnosis present

## 2015-10-06 DIAGNOSIS — C3492 Malignant neoplasm of unspecified part of left bronchus or lung: Secondary | ICD-10-CM | POA: Diagnosis not present

## 2015-10-06 DIAGNOSIS — Z8249 Family history of ischemic heart disease and other diseases of the circulatory system: Secondary | ICD-10-CM | POA: Diagnosis not present

## 2015-10-06 DIAGNOSIS — I2510 Atherosclerotic heart disease of native coronary artery without angina pectoris: Secondary | ICD-10-CM | POA: Diagnosis present

## 2015-10-06 DIAGNOSIS — Z79899 Other long term (current) drug therapy: Secondary | ICD-10-CM | POA: Diagnosis not present

## 2015-10-06 DIAGNOSIS — K589 Irritable bowel syndrome without diarrhea: Secondary | ICD-10-CM | POA: Diagnosis present

## 2015-10-06 DIAGNOSIS — K8020 Calculus of gallbladder without cholecystitis without obstruction: Secondary | ICD-10-CM | POA: Diagnosis not present

## 2015-10-06 DIAGNOSIS — E039 Hypothyroidism, unspecified: Secondary | ICD-10-CM | POA: Diagnosis present

## 2015-10-06 DIAGNOSIS — Z87891 Personal history of nicotine dependence: Secondary | ICD-10-CM | POA: Diagnosis not present

## 2015-10-06 DIAGNOSIS — K810 Acute cholecystitis: Secondary | ICD-10-CM | POA: Diagnosis not present

## 2015-10-06 DIAGNOSIS — I739 Peripheral vascular disease, unspecified: Secondary | ICD-10-CM | POA: Diagnosis present

## 2015-10-06 DIAGNOSIS — C787 Secondary malignant neoplasm of liver and intrahepatic bile duct: Secondary | ICD-10-CM | POA: Diagnosis present

## 2015-10-06 DIAGNOSIS — J449 Chronic obstructive pulmonary disease, unspecified: Secondary | ICD-10-CM | POA: Diagnosis present

## 2015-10-06 DIAGNOSIS — Z9981 Dependence on supplemental oxygen: Secondary | ICD-10-CM | POA: Diagnosis not present

## 2015-10-06 DIAGNOSIS — Z886 Allergy status to analgesic agent status: Secondary | ICD-10-CM | POA: Diagnosis not present

## 2015-10-06 DIAGNOSIS — D3502 Benign neoplasm of left adrenal gland: Secondary | ICD-10-CM | POA: Diagnosis present

## 2015-10-06 DIAGNOSIS — K760 Fatty (change of) liver, not elsewhere classified: Secondary | ICD-10-CM | POA: Diagnosis present

## 2015-10-06 DIAGNOSIS — Z888 Allergy status to other drugs, medicaments and biological substances status: Secondary | ICD-10-CM | POA: Diagnosis not present

## 2015-10-06 DIAGNOSIS — C3490 Malignant neoplasm of unspecified part of unspecified bronchus or lung: Secondary | ICD-10-CM | POA: Diagnosis not present

## 2015-10-06 DIAGNOSIS — I10 Essential (primary) hypertension: Secondary | ICD-10-CM | POA: Diagnosis present

## 2015-10-06 DIAGNOSIS — Z881 Allergy status to other antibiotic agents status: Secondary | ICD-10-CM | POA: Diagnosis not present

## 2015-10-06 DIAGNOSIS — G4700 Insomnia, unspecified: Secondary | ICD-10-CM | POA: Diagnosis present

## 2015-10-06 DIAGNOSIS — E785 Hyperlipidemia, unspecified: Secondary | ICD-10-CM | POA: Diagnosis present

## 2015-10-06 DIAGNOSIS — C3432 Malignant neoplasm of lower lobe, left bronchus or lung: Secondary | ICD-10-CM | POA: Diagnosis not present

## 2015-10-06 LAB — COMPREHENSIVE METABOLIC PANEL
ALBUMIN: 3.4 g/dL — AB (ref 3.5–5.0)
ALT: 10 U/L — ABNORMAL LOW (ref 14–54)
AST: 22 U/L (ref 15–41)
Alkaline Phosphatase: 34 U/L — ABNORMAL LOW (ref 38–126)
Anion gap: 11 (ref 5–15)
BUN: 11 mg/dL (ref 6–20)
CHLORIDE: 105 mmol/L (ref 101–111)
CO2: 24 mmol/L (ref 22–32)
CREATININE: 1.66 mg/dL — AB (ref 0.44–1.00)
Calcium: 9.3 mg/dL (ref 8.9–10.3)
GFR, EST AFRICAN AMERICAN: 33 mL/min — AB (ref >60)
GFR, EST NON AFRICAN AMERICAN: 28 mL/min — AB (ref >60)
Glucose, Bld: 99 mg/dL (ref 65–99)
POTASSIUM: 3.6 mmol/L (ref 3.5–5.1)
SODIUM: 140 mmol/L (ref 135–145)
Total Bilirubin: 0.8 mg/dL (ref 0.3–1.2)
Total Protein: 6 g/dL — ABNORMAL LOW (ref 6.5–8.1)

## 2015-10-06 LAB — CBC
HCT: 29.7 % — ABNORMAL LOW (ref 36.0–46.0)
HEMOGLOBIN: 9.7 g/dL — AB (ref 12.0–15.0)
MCH: 33.6 pg (ref 26.0–34.0)
MCHC: 32.7 g/dL (ref 30.0–36.0)
MCV: 102.8 fL — ABNORMAL HIGH (ref 78.0–100.0)
Platelets: 299 10*3/uL (ref 150–400)
RBC: 2.89 MIL/uL — AB (ref 3.87–5.11)
RDW: 14.2 % (ref 11.5–15.5)
WBC: 6.5 10*3/uL (ref 4.0–10.5)

## 2015-10-06 LAB — URINALYSIS, ROUTINE W REFLEX MICROSCOPIC
Bilirubin Urine: NEGATIVE
Glucose, UA: NEGATIVE mg/dL
Hgb urine dipstick: NEGATIVE
Ketones, ur: NEGATIVE mg/dL
Nitrite: NEGATIVE
Protein, ur: NEGATIVE mg/dL
Specific Gravity, Urine: 1.01 (ref 1.005–1.030)
pH: 5.5 (ref 5.0–8.0)

## 2015-10-06 LAB — URINE MICROSCOPIC-ADD ON

## 2015-10-06 MED ORDER — ASPIRIN EC 81 MG PO TBEC
81.0000 mg | DELAYED_RELEASE_TABLET | Freq: Every day | ORAL | Status: DC
  Administered 2015-10-06 – 2015-10-11 (×6): 81 mg via ORAL
  Filled 2015-10-06 (×6): qty 1

## 2015-10-06 MED ORDER — ALLOPURINOL 300 MG PO TABS
300.0000 mg | ORAL_TABLET | Freq: Every day | ORAL | Status: DC
  Administered 2015-10-06 – 2015-10-11 (×6): 300 mg via ORAL
  Filled 2015-10-06 (×6): qty 1

## 2015-10-06 MED ORDER — MORPHINE SULFATE (PF) 4 MG/ML IV SOLN
2.6100 mg | Freq: Once | INTRAVENOUS | Status: AC
  Administered 2015-10-06: 2.61 mg via INTRAVENOUS

## 2015-10-06 MED ORDER — AMLODIPINE BESYLATE 5 MG PO TABS
5.0000 mg | ORAL_TABLET | Freq: Every day | ORAL | Status: DC
Start: 2015-10-06 — End: 2015-10-11
  Administered 2015-10-06 – 2015-10-11 (×6): 5 mg via ORAL
  Filled 2015-10-06 (×6): qty 1

## 2015-10-06 MED ORDER — HYDRALAZINE HCL 25 MG PO TABS
25.0000 mg | ORAL_TABLET | Freq: Three times a day (TID) | ORAL | Status: DC
  Administered 2015-10-06 – 2015-10-11 (×16): 25 mg via ORAL
  Filled 2015-10-06 (×16): qty 1

## 2015-10-06 MED ORDER — PIPERACILLIN-TAZOBACTAM 3.375 G IVPB
3.3750 g | Freq: Three times a day (TID) | INTRAVENOUS | Status: DC
  Administered 2015-10-06 – 2015-10-10 (×13): 3.375 g via INTRAVENOUS
  Filled 2015-10-06 (×15): qty 50

## 2015-10-06 MED ORDER — FOLIC ACID 1 MG PO TABS
1.0000 mg | ORAL_TABLET | Freq: Every day | ORAL | Status: DC
  Administered 2015-10-06 – 2015-10-11 (×6): 1 mg via ORAL
  Filled 2015-10-06 (×6): qty 1

## 2015-10-06 MED ORDER — ALPRAZOLAM 0.5 MG PO TABS
1.0000 mg | ORAL_TABLET | Freq: Three times a day (TID) | ORAL | Status: DC
  Administered 2015-10-06 – 2015-10-11 (×16): 1 mg via ORAL
  Filled 2015-10-06 (×17): qty 2

## 2015-10-06 MED ORDER — PIPERACILLIN-TAZOBACTAM 3.375 G IVPB 30 MIN
3.3750 g | Freq: Once | INTRAVENOUS | Status: AC
  Administered 2015-10-06: 3.375 g via INTRAVENOUS
  Filled 2015-10-06: qty 50

## 2015-10-06 MED ORDER — TECHNETIUM TC 99M MEBROFENIN IV KIT
5.1000 | PACK | Freq: Once | INTRAVENOUS | Status: AC | PRN
  Administered 2015-10-06: 5 via INTRAVENOUS

## 2015-10-06 MED ORDER — DULOXETINE HCL 60 MG PO CPEP
60.0000 mg | ORAL_CAPSULE | Freq: Every day | ORAL | Status: DC
  Administered 2015-10-06 – 2015-10-11 (×6): 60 mg via ORAL
  Filled 2015-10-06 (×6): qty 1

## 2015-10-06 MED ORDER — QUETIAPINE FUMARATE 25 MG PO TABS
25.0000 mg | ORAL_TABLET | Freq: Every day | ORAL | Status: DC
  Administered 2015-10-06 – 2015-10-10 (×5): 25 mg via ORAL
  Filled 2015-10-06 (×5): qty 1

## 2015-10-06 MED ORDER — ACETAMINOPHEN 500 MG PO TABS
500.0000 mg | ORAL_TABLET | Freq: Four times a day (QID) | ORAL | Status: DC | PRN
  Administered 2015-10-07 (×2): 500 mg via ORAL
  Filled 2015-10-06 (×2): qty 1

## 2015-10-06 MED ORDER — SODIUM CHLORIDE 0.9 % IV SOLN
INTRAVENOUS | Status: DC
  Administered 2015-10-06 – 2015-10-11 (×7): via INTRAVENOUS

## 2015-10-06 MED ORDER — MORPHINE SULFATE (PF) 4 MG/ML IV SOLN
INTRAVENOUS | Status: AC
  Filled 2015-10-06: qty 1

## 2015-10-06 MED ORDER — DONEPEZIL HCL 5 MG PO TABS
5.0000 mg | ORAL_TABLET | Freq: Every day | ORAL | Status: DC
  Administered 2015-10-07 – 2015-10-10 (×4): 5 mg via ORAL
  Filled 2015-10-06 (×4): qty 1

## 2015-10-06 MED ORDER — MORPHINE SULFATE (PF) 4 MG/ML IV SOLN
4.0000 mg | Freq: Once | INTRAVENOUS | Status: AC
  Administered 2015-10-06: 4 mg via INTRAVENOUS
  Filled 2015-10-06: qty 1

## 2015-10-06 MED ORDER — ENOXAPARIN SODIUM 30 MG/0.3ML ~~LOC~~ SOLN
30.0000 mg | SUBCUTANEOUS | Status: DC
  Administered 2015-10-06: 30 mg via SUBCUTANEOUS
  Filled 2015-10-06: qty 0.3

## 2015-10-06 NOTE — ED Notes (Signed)
Pt reports "feeling much better".

## 2015-10-06 NOTE — Consult Note (Signed)
Reason for Consult: abdominal pain Referring Physician:  [name] is an 81 y.o. female.  HPI: She has had diarrhea for the last 3 days which is not unusual as she has a history of IBS with intermittent diarrhea and constipation. However, beginning yesterday she began having right upper quadrant abdominal pain. The pain is constant, it is not associated with food, she did have a low grade fever a few days ago, she did vomit yesterday, she has not had a similar sensation to the pain previously.  She underwent a CT to follow her advanced stage lung cancer on 4/10 and then returned to the ER the next day. In the winter she had lost 20 pounds but now she is stable on her weight for the last few months. She has worn oxygen for the last year, coinciding with the completion of treatment for her lung cancer. Her oncologist has been following a questionable liver lesion for some time now.  Past Medical History  Diagnosis Date  . Hypertension   . Hypothyroidism   . Hyperlipidemia   . Insomnia   . GERD (gastroesophageal reflux disease)     chronic gastritis  . Adrenal adenoma   . Chronic diarrhea   . Fatty liver   . Arthritis     knee  . QT prolongation     syncope  . Coronary artery disease   . Impaired fasting glucose   . Renal insufficiency     low protien diet  . Tobacco use     1/2 ppd, approx 25-50 pack years (as of 08/2012)  . Family history of heart disease   . IBS (irritable bowel syndrome)   . Peripheral arterial disease ([place])     sstatus post  infrarenal abdominal aortic tube graft placed by Dr. Victorino [name] February 2008  . Cancer ([place])     Lung  . Squamous cell carcinoma of left lung ([place]) 07/29/2013  . HCAP (healthcare-associated pneumonia)     10/2014  . Acute respiratory failure ([place])     10/2014  . COPD (chronic obstructive pulmonary disease) ([place])     moderat  . Pleural effusion     Past Surgical History  Procedure Laterality Date  . Abdominal  hysterectomy    . Thyroidectomy, partial    . Colonoscopy  5/04    normal  . Abdominal aortic aneurysm repair  07/2006    [name]  . Cataract extraction Bilateral 2010  . Cardiac catheterization  02/2010    mod CAD in L-dominant system with normal LV function  . Colon resection  2005    1/2 colon removed  . Transthoracic echocardiogram  02/2010    EF 55-60%; mild conc LVH, normal systolic function; mildly calcified AV annulus  . Colonoscopy with esophagogastroduodenoscopy (egd) N/A 03/19/2013    Procedure: COLONOSCOPY WITH ESOPHAGOGASTRODUODENOSCOPY (EGD);  Surgeon: Leah Houston, MD;  Location: [facility];  Service: Endoscopy;  Laterality: N/A;  145  . Dg biopsy lung Left Jan 2015    Family History  Problem Relation Age of Onset  . Hypertension Mother   . Diabetes Mother   . Heart attack Mother   . Hyperlipidemia Sister     x3 sister  . Kidney disease Brother     Social History:  reports that she has quit smoking. Her smoking use included Cigarettes. She has a 29.5 pack-year smoking history. She has never used smokeless tobacco. She reports that she does not drink alcohol or  use illicit drugs.  Allergies:  Allergies  Allergen Reactions  . Aleve [Naproxen Sodium] Other (See Comments)    GI bleed  . Dilaudid [Hydromorphone Hcl]     Can not tolerate  . Dyazide [Hydrochlorothiazide W-Triamterene] Other (See Comments)    cramps  . Zithromax [Azithromycin] Itching    Medications: I have reviewed the patient's current medications.  Results for orders placed or performed during the hospital encounter of 10/05/15 (from the past 48 hour(s))  CBC with Differential     Status: Abnormal   Collection Time: 10/05/15  9:45 PM  Result Value Ref Range   WBC 7.7 4.0 - 10.5 K/uL   RBC 2.99 (L) 3.87 - 5.11 MIL/uL   Hemoglobin 10.1 (L) 12.0 - 15.0 g/dL   HCT 30.8 (L) 36.0 - 46.0 %   MCV 103.0 (H) 78.0 - 100.0 fL   MCH 33.8 26.0 - 34.0 pg   MCHC 32.8 30.0 - 36.0 g/dL   RDW  14.2 11.5 - 15.5 %   Platelets 332 150 - 400 K/uL   Neutrophils Relative % 80 %   Neutro Abs 6.2 1.7 - 7.7 K/uL   Lymphocytes Relative 10 %   Lymphs Abs 0.8 0.7 - 4.0 K/uL   Monocytes Relative 7 %   Monocytes Absolute 0.5 0.1 - 1.0 K/uL   Eosinophils Relative 3 %   Eosinophils Absolute 0.2 0.0 - 0.7 K/uL   Basophils Relative 0 %   Basophils Absolute 0.0 0.0 - 0.1 K/uL  Comprehensive metabolic panel     Status: Abnormal   Collection Time: 10/05/15  9:45 PM  Result Value Ref Range   Sodium 139 135 - 145 mmol/L   Potassium 3.6 3.5 - 5.1 mmol/L   Chloride 105 101 - 111 mmol/L   CO2 24 22 - 32 mmol/L   Glucose, Bld 122 (H) 65 - 99 mg/dL   BUN 15 6 - 20 mg/dL   Creatinine, Ser 1.83 (H) 0.44 - 1.00 mg/dL   Calcium 9.3 8.9 - 10.3 mg/dL   Total Protein 7.1 6.5 - 8.1 g/dL   Albumin 4.3 3.5 - 5.0 g/dL   AST 26 15 - 41 U/L   ALT 10 (L) 14 - 54 U/L   Alkaline Phosphatase 36 (L) 38 - 126 U/L   Total Bilirubin 0.4 0.3 - 1.2 mg/dL   GFR calc non Af Amer 25 (L) >60 mL/min   GFR calc Af Amer 29 (L) >60 mL/min    Comment: (NOTE) The eGFR has been calculated using the CKD EPI equation. This calculation has not been validated in all clinical situations. eGFR's persistently <60 mL/min signify possible Chronic Kidney Disease.    Anion gap 10 5 - 15  Lipase, blood     Status: None   Collection Time: 10/05/15  9:45 PM  Result Value Ref Range   Lipase 32 11 - 51 U/L  Urinalysis, Routine w reflex microscopic (not at [facility])     Status: Abnormal   Collection Time: 10/06/15 12:30 AM  Result Value Ref Range   Color, Urine YELLOW YELLOW   APPearance HAZY (A) CLEAR   Specific Gravity, Urine 1.010 1.005 - 1.030   pH 5.5 5.0 - 8.0   Glucose, UA NEGATIVE NEGATIVE mg/dL   Hgb urine dipstick NEGATIVE NEGATIVE   Bilirubin Urine NEGATIVE NEGATIVE   Ketones, ur NEGATIVE NEGATIVE mg/dL   Protein, ur NEGATIVE NEGATIVE mg/dL   Nitrite NEGATIVE NEGATIVE   Leukocytes, UA TRACE (A) NEGATIVE  Urine  microscopic-add  on     Status: Abnormal   Collection Time: 10/06/15 12:30 AM  Result Value Ref Range   Squamous Epithelial / LPF 0-5 (A) NONE SEEN   WBC, UA 6-30 0 - 5 WBC/hpf   RBC / HPF 0-5 0 - 5 RBC/hpf   Bacteria, UA FEW (A) NONE SEEN   Casts HYALINE CASTS (A) NEGATIVE    Comment: CELLULAR    Ct Abdomen Pelvis Wo Contrast  10/06/2015  ADDENDUM REPORT: 10/06/2015 02:13 ADDENDUM: Prior reports have been obtained. The left hepatic lobe liver lesion is enlarged from 08/27/2014, as reported on the 10/04/2015 CT. The right hepatic lobe dome lesion is stable. Nonemergent abdominal MRI without/with contrast would be helpful for further evaluation of the enlarging left hepatic lobe lesion, if clinically warranted. The left adrenal nodule is unchanged from 08/27/2014. Electronically Signed   By: Andreas Newport M.D.   On: 10/06/2015 02:13  10/06/2015  CLINICAL DATA:  Dull right-sided abdominal pain, onset this morning. Nausea and chills. EXAM: CT ABDOMEN AND PELVIS WITHOUT CONTRAST TECHNIQUE: Multidetector CT imaging of the abdomen and pelvis was performed following the standard protocol without IV contrast. COMPARISON:  10/04/2015 FINDINGS: The gallbladder is distended, and there is an 11 mm calculus in the gallbladder neck. There is mild stranding around the gallbladder and a suggestion of gallbladder mural thickening. The findings may represent acute cholecystitis. No extrahepatic bile duct dilatation. There is mild right hydronephrosis without visible ureteral calculus. Left collecting system is mildly fold but not frankly hydronephrotic. Otherwise unremarkable appearances of the kidneys. No urinary calculi are evident. 2.7 cm low-attenuation liver lesion in the left lobe anteriorly, as well as a 1.7 cm low-attenuation lesion in the right hepatic lobe superiorly. These are indeterminate. There are unremarkable unenhanced appearances of the spleen and pancreas. There is a 2.8 cm left adrenal nodule.  Extensive prior bowel surgery. No evidence of bowel obstruction. No extraluminal air. Abdominal aortic graft, appearing intact on this unenhanced scan. No significant skeletal lesion. Unchanged consolidation in the left lung base medially. IMPRESSION: 1. New gallbladder distention, mural thickening and adjacent stranding. Cholelithiasis. This may represent acute cholecystitis. Recommend right upper quadrant sonography to evaluate. 2. Mild right hydronephrosis and mild fullness of the left renal collecting system. No ureteral calculi are evident. 3. Low-attenuation liver lesions, not characterized. Left adrenal nodule, not characterized. Prior studies not currently available to assess stability. Electronically Signed: By: Andreas Newport M.D. On: 10/06/2015 00:36   Ct Abdomen Pelvis Wo Contrast  10/04/2015  CLINICAL DATA:  Lung cancer.  Chemotherapy and radiation therapy. EXAM: CT CHEST, ABDOMEN AND PELVIS WITHOUT CONTRAST TECHNIQUE: Multidetector CT imaging of the chest, abdomen and pelvis was performed following the standard protocol without IV contrast. COMPARISON:  CT chest 04/13/2015 and CT abdomen pelvis 08/27/2014. FINDINGS: CT CHEST FINDINGS Mediastinum/Lymph Nodes: Right IJ Port-A-Cath terminates in the SVC. No pathologically enlarged mediastinal or axillary lymph nodes. Hilar regions are difficult to definitively evaluate without IV contrast. Pulmonary arteries are enlarged. Coronary artery calcification. Heart is at the upper limits of normal in size to mildly enlarged. Small amount of pericardial fluid may be physiologic. Pre pericardiac lymph nodes are sub cm in short axis size. Lungs/Pleura: Minimal biapical pleural parenchymal scarring. Moderate centrilobular emphysema. Minimal paraseptal emphysema. Post treatment collapse/ consolidation bronchiectasis in the medial aspect of the left hemi thorax. Small left pleural effusion, slightly decreased. No right pleural fluid. There is narrowing of the  left lower lobe bronchi. Airway is otherwise unremarkable. Musculoskeletal: No worrisome  lytic or sclerotic lesions. CT ABDOMEN PELVIS FINDINGS Hepatobiliary: Intermediate attenuation lesion in segment 4 of the liver measures 2.9 cm (2/55), previously 1.9 cm on 08/27/2014 and 11 mm on 10/24/2013. 2.0 cm low-attenuation lesion in the dome of the liver (2/48), stable. Stone is seen in the gallbladder. No biliary ductal dilatation. Pancreas: Negative. Spleen: Negative. Adrenals/Urinary Tract: Right adrenal gland is unremarkable. Left adrenal mass measures 2.5 x 2.9 cm and is fluid in density, stable. Kidneys are unremarkable. Ureters are decompressed. Bladder is somewhat low in volume. Stomach/Bowel: Stomach and proximal small bowel are unremarkable. Subtotal colectomy. Rectosigmoid colon is unremarkable. Vascular/Lymphatic: Atherosclerotic calcification of the arterial vasculature with an infrarenal aortic aneurysm repair. Infrarenal aorta measures up to 3.0 cm, stable. Retroperitoneal lymph nodes measure up to 10 mm in the left periaortic station, previously 7 mm. Reproductive: Hysterectomy.  Ovaries are atrophic. Other: Scattered surgical clips in the abdomen. Mesenteries and peritoneum are otherwise unremarkable. No free fluid. Musculoskeletal: No worrisome lytic or sclerotic lesions. Degenerative changes are seen in the spine. IMPRESSION: 1. Enlarging intermediate density segment 4 liver lesion. Metastatic disease cannot be excluded. Consider further evaluation with repeat MR abdomen (preferably without and with contrast), as clinically indicated. 2. Posttreatment changes in the left hemi thorax. Associated small left pleural effusion, slightly decreased. 3. Enlarged pulmonary arteries, indicative of pulmonary arterial hypertension. 4. Coronary artery calcification. 5. Cholelithiasis. 6. Left adrenal adenoma. Electronically Signed   By: Lorin Picket M.D.   On: 10/04/2015 13:57   Ct Chest Wo  Contrast  10/04/2015  CLINICAL DATA:  Lung cancer.  Chemotherapy and radiation therapy. EXAM: CT CHEST, ABDOMEN AND PELVIS WITHOUT CONTRAST TECHNIQUE: Multidetector CT imaging of the chest, abdomen and pelvis was performed following the standard protocol without IV contrast. COMPARISON:  CT chest 04/13/2015 and CT abdomen pelvis 08/27/2014. FINDINGS: CT CHEST FINDINGS Mediastinum/Lymph Nodes: Right IJ Port-A-Cath terminates in the SVC. No pathologically enlarged mediastinal or axillary lymph nodes. Hilar regions are difficult to definitively evaluate without IV contrast. Pulmonary arteries are enlarged. Coronary artery calcification. Heart is at the upper limits of normal in size to mildly enlarged. Small amount of pericardial fluid may be physiologic. Pre pericardiac lymph nodes are sub cm in short axis size. Lungs/Pleura: Minimal biapical pleural parenchymal scarring. Moderate centrilobular emphysema. Minimal paraseptal emphysema. Post treatment collapse/ consolidation bronchiectasis in the medial aspect of the left hemi thorax. Small left pleural effusion, slightly decreased. No right pleural fluid. There is narrowing of the left lower lobe bronchi. Airway is otherwise unremarkable. Musculoskeletal: No worrisome lytic or sclerotic lesions. CT ABDOMEN PELVIS FINDINGS Hepatobiliary: Intermediate attenuation lesion in segment 4 of the liver measures 2.9 cm (2/55), previously 1.9 cm on 08/27/2014 and 11 mm on 10/24/2013. 2.0 cm low-attenuation lesion in the dome of the liver (2/48), stable. Stone is seen in the gallbladder. No biliary ductal dilatation. Pancreas: Negative. Spleen: Negative. Adrenals/Urinary Tract: Right adrenal gland is unremarkable. Left adrenal mass measures 2.5 x 2.9 cm and is fluid in density, stable. Kidneys are unremarkable. Ureters are decompressed. Bladder is somewhat low in volume. Stomach/Bowel: Stomach and proximal small bowel are unremarkable. Subtotal colectomy. Rectosigmoid colon is  unremarkable. Vascular/Lymphatic: Atherosclerotic calcification of the arterial vasculature with an infrarenal aortic aneurysm repair. Infrarenal aorta measures up to 3.0 cm, stable. Retroperitoneal lymph nodes measure up to 10 mm in the left periaortic station, previously 7 mm. Reproductive: Hysterectomy.  Ovaries are atrophic. Other: Scattered surgical clips in the abdomen. Mesenteries and peritoneum are otherwise unremarkable. No free fluid. Musculoskeletal:  No worrisome lytic or sclerotic lesions. Degenerative changes are seen in the spine. IMPRESSION: 1. Enlarging intermediate density segment 4 liver lesion. Metastatic disease cannot be excluded. Consider further evaluation with repeat MR abdomen (preferably without and with contrast), as clinically indicated. 2. Posttreatment changes in the left hemi thorax. Associated small left pleural effusion, slightly decreased. 3. Enlarged pulmonary arteries, indicative of pulmonary arterial hypertension. 4. Coronary artery calcification. 5. Cholelithiasis. 6. Left adrenal adenoma. Electronically Signed   By: Lorin Picket M.D.   On: 10/04/2015 13:57    Review of Systems  Constitutional: Negative for fever and chills.  HENT: Negative for hearing loss.   Eyes: Negative for blurred vision and double vision.  Respiratory: Positive for shortness of breath. Negative for cough and hemoptysis.   Cardiovascular: Negative for chest pain and palpitations.  Gastrointestinal: Positive for nausea, vomiting, abdominal pain and diarrhea.  Genitourinary: Negative for dysuria and urgency.  Musculoskeletal: Negative for myalgias and neck pain.  Skin: Negative for itching and rash.  Neurological: Negative for dizziness, tingling and headaches.  Endo/Heme/Allergies: Does not bruise/bleed easily.  Psychiatric/Behavioral: Negative for depression and suicidal ideas.   Blood pressure 150/69, pulse 78, temperature 99.5 F (37.5 C), temperature source Oral, resp. rate 16,  height 5' 7"  (1.702 m), weight 65.363 kg (144 lb 1.6 oz), SpO2 100 %. Physical Exam  Vitals reviewed. Constitutional: She is oriented to person, place, and time. She appears well-developed and well-nourished.  HENT:  Head: Normocephalic and atraumatic.  Eyes: Conjunctivae and EOM are normal. Pupils are equal, round, and reactive to light.  Neck: Normal range of motion. Neck supple.  Cardiovascular: Normal rate and regular rhythm.   Respiratory: Effort normal and breath sounds normal.  GI: Soft. Bowel sounds are normal. She exhibits no distension. There is tenderness in the right upper quadrant. There is no tenderness at McBurney's point and negative Murphy's sign.  Musculoskeletal: Normal range of motion.  Neurological: She is alert and oriented to person, place, and time.  Skin: Skin is warm and dry.  Psychiatric: She has a normal mood and affect. Her behavior is normal.    Assessment/Plan: 82 yo female with right upper quadrant abdominal pain and findings of distended gallbladder with cholelithiasis, however she has no leukocytosis, no LFT elevation, and a negative Murphy's sign. She is a poor surgical candidate and if she had definite cholecystitis a cholecystostomy tube and antibiotics would be the optimal treatment. -HIDA scan to further evaluate for gallbladder filling prior to invasive procedure -NPO -continue antibiotics  Arta Bruce Kinsinger 10/06/2015, 6:24 AM

## 2015-10-06 NOTE — ED Notes (Signed)
Physician in to speak of transfer to [facility]

## 2015-10-06 NOTE — H&P (Addendum)
PCP:   Mickie Hillier, MD   Chief Complaint:  Abdominal pain  HPI: 81 year old female who   has a past medical history of Hypertension; Hypothyroidism; Hyperlipidemia; Insomnia; GERD (gastroesophageal reflux disease); Adrenal adenoma; Chronic diarrhea; Fatty liver; Arthritis; QT prolongation; Coronary artery disease; Impaired fasting glucose; Renal insufficiency; Tobacco use; Family history of heart disease; IBS (irritable bowel syndrome); Peripheral arterial disease ([place]); Cancer ([place]); Squamous cell carcinoma of left lung ([place]) (07/29/2013); HCAP (healthcare-associated pneumonia); Acute respiratory failure (HCC); COPD (chronic obstructive pulmonary disease) ([place]); and Pleural effusion. Today presents to the hospital with complaints of right upper quadrant abdominal pain which has been going on for past few days. Patient also reports nausea and vomiting 3 episodes of diarrhea. She denies any fever but admits to being cold all the time. She denies chest pain or shortness of breath. No dysuria. In the ED CT scan of the abdomen and pelvis was done which showed gallbladder distention, mural thickening and adjacent stranding with cholelithiasis. Suggesting acute cholecystitis. Patient was started on Zosyn. General surgery at [facility] was contacted by the ED physician, and they recommended to transfer the patient to [facility].   Allergies:   Allergies  Allergen Reactions  . Aleve [Naproxen Sodium] Other (See Comments)    GI bleed  . Dilaudid [Hydromorphone Hcl]     Can not tolerate  . Dyazide [Hydrochlorothiazide W-Triamterene] Other (See Comments)    cramps  . Zithromax [Azithromycin] Itching      Past Medical History  Diagnosis Date  . Hypertension   . Hypothyroidism   . Hyperlipidemia   . Insomnia   . GERD (gastroesophageal reflux disease)     chronic gastritis  . Adrenal adenoma   . Chronic diarrhea   . Fatty liver   . Arthritis     knee  . QT prolongation    syncope  . Coronary artery disease   . Impaired fasting glucose   . Renal insufficiency     low protien diet  . Tobacco use     1/2 ppd, approx 25-50 pack years (as of 08/2012)  . Family history of heart disease   . IBS (irritable bowel syndrome)   . Peripheral arterial disease ([name])     sstatus post  infrarenal abdominal aortic tube graft placed by Dr. Victorino Dike February 2008  . Cancer ([place])     Lung  . Squamous cell carcinoma of left lung ([place]) 07/29/2013  . HCAP (healthcare-associated pneumonia)     10/2014  . Acute respiratory failure ([place])     10/2014  . COPD (chronic obstructive pulmonary disease) ([place])     moderat  . Pleural effusion     Past Surgical History  Procedure Laterality Date  . Abdominal hysterectomy    . Thyroidectomy, partial    . Colonoscopy  5/04    normal  . Abdominal aortic aneurysm repair  07/2006    D. J.D. Kellie Simmering  . Cataract extraction Bilateral 2010  . Cardiac catheterization  02/2010    mod CAD in L-dominant system with normal LV function  . Colon resection  2005    1/2 colon removed  . Transthoracic echocardiogram  02/2010    EF 55-60%; mild conc LVH, normal systolic function; mildly calcified AV annulus  . Colonoscopy with esophagogastroduodenoscopy (egd) N/A 03/19/2013    Procedure: COLONOSCOPY WITH ESOPHAGOGASTRODUODENOSCOPY (EGD);  Surgeon: Rogene Houston, MD;  Location: [facility];  Service: Endoscopy;  Laterality: N/A;  145  . Dg biopsy  lung Left Jan 2015    Prior to Admission medications   Medication Sig Start Date End Date Taking? Authorizing Provider  acetaminophen (TYLENOL) 500 MG tablet Take 500 mg by mouth every 6 (six) hours as needed for mild pain or moderate pain.   Yes Historical Provider, MD  allopurinol (ZYLOPRIM) 300 MG tablet take 1 tablet by mouth once daily 04/21/15  Yes Mikey Kirschner, MD  ALPRAZolam (XANAX) 1 MG tablet TAKE ONE-HALF TO ONE TABLET BY MOUTH THREE TIMES DAILY AS NEEDED FOR ANXIETY Patient taking  differently: TAKE ONE TABLET BY MOUTH THREE TIMES DAILY 08/02/15  Yes Mikey Kirschner, MD  amLODipine (NORVASC) 5 MG tablet take 1 tablet by mouth once daily 06/29/15  Yes Kathyrn Drown, MD  aspirin EC 81 MG tablet Take 81 mg by mouth daily.   Yes Historical Provider, MD  donepezil (ARICEPT) 10 MG tablet Take 1 tablet daily 08/02/15  Yes Cameron Sprang, MD  DULoxetine (CYMBALTA) 60 MG capsule take 1 capsule by mouth once daily 08/30/15  Yes Mikey Kirschner, MD  enalapril (VASOTEC) 5 MG tablet Take 5 mg by mouth daily. 08/19/15  Yes Historical Provider, MD  fenofibrate 160 MG tablet take 1 tablet by mouth once daily with food 09/21/15  Yes Mikey Kirschner, MD  ferrous sulfate (FERROUSUL) 325 (65 FE) MG tablet Take 1 tablet (325 mg total) by mouth 2 (two) times daily after a meal. 03/19/13  Yes Rogene Houston, MD  folic acid (FOLVITE) 1 MG tablet Take 1 tablet (1 mg total) by mouth daily. 08/03/15  Yes Mikey Kirschner, MD  hydrALAZINE (APRESOLINE) 25 MG tablet take 1 tablet by mouth three times a day 04/26/15  Yes Mikey Kirschner, MD  HYDROcodone-acetaminophen (NORCO/VICODIN) 5-325 MG tablet Take 1 tablet twice a day as needed for pain Patient taking differently: Take 1 tablet by mouth 2 (two) times daily as needed for moderate pain or severe pain. Take 1 tablet twice a day as needed for pain 06/14/15  Yes Mikey Kirschner, MD  levothyroxine (SYNTHROID, LEVOTHROID) 137 MCG tablet take 1 tablet by mouth once daily 04/23/15  Yes Mikey Kirschner, MD  lidocaine-prilocaine (EMLA) cream Apply 1 application topically as needed (port).   Yes Historical Provider, MD  loperamide (IMODIUM) 2 MG capsule Take 2 mg by mouth 3 (three) times daily as needed for diarrhea or loose stools.    Yes Historical Provider, MD  meclizine (ANTIVERT) 25 MG tablet Take 1 tablet (25 mg total) by mouth 3 (three) times daily as needed for dizziness. 08/19/15  Yes Mikey Kirschner, MD  Menthol-Methyl Salicylate (MUSCLE RUB) 10-15 % CREA  Apply 1 application topically as needed for muscle pain.   Yes Historical Provider, MD  ondansetron (ZOFRAN-ODT) 4 MG disintegrating tablet dissolve 1 tablet ON TONGUE every 8 hours if needed for nausea and vomiting 08/30/15  Yes Mikey Kirschner, MD  pantoprazole (PROTONIX) 40 MG tablet Take 1 tablet (40 mg total) by mouth daily. 07/22/15  Yes Mikey Kirschner, MD  potassium chloride (K-DUR) 10 MEQ tablet Take 2 tabs daily Patient taking differently: Take 20 mEq by mouth daily. Take 2 tabs daily 04/08/15  Yes Mikey Kirschner, MD  pravastatin (PRAVACHOL) 20 MG tablet take 1 tablet by mouth once daily 05/04/15  Yes Mikey Kirschner, MD  QUEtiapine (SEROQUEL) 25 MG tablet Take 1 tablet (25 mg total) by mouth at bedtime. 08/02/15  Yes Cameron Sprang, MD  [facility] Michaell Cowing  18 MCG inhalation capsule Place 18 mcg into inhaler and inhale as needed (for shortness of breath).  11/14/14  Yes Historical Provider, MD  traMADol (ULTRAM) 50 MG tablet take 1 tablet by mouth every 6 hours if needed Patient taking differently: Take 50 mg by mouth every 6 (six) hours as needed for moderate pain.  06/14/15  Yes Mikey Kirschner, MD  Vitamin D, Ergocalciferol, (DRISDOL) 50000 UNITS CAPS capsule Take 1 capsule (50,000 Units total) by mouth once a week. 06/14/15  Yes Mikey Kirschner, MD  amoxicillin (AMOXIL) 500 MG capsule Take 1 capsule (500 mg total) by mouth 3 (three) times daily. X 10 days Patient not taking: Reported on 10/05/2015 09/24/15   Mikey Kirschner, MD  cefPROZIL (CEFZIL) 500 MG tablet Take 1 tablet (500 mg total) by mouth 2 (two) times daily. Patient not taking: Reported on 10/05/2015 09/10/15   Mikey Kirschner, MD    Social History:  reports that she has quit smoking. Her smoking use included Cigarettes. She has a 29.5 pack-year smoking history. She has never used smokeless tobacco. She reports that she does not drink alcohol or use illicit drugs.  Family History  Problem Relation Age of Onset  .  Hypertension Mother   . Diabetes Mother   . Heart attack Mother   . Hyperlipidemia Sister     x3 sister  . Kidney disease Brother     Danley Danker Weights   10/05/15 2051  Weight: 66.225 kg (146 lb)    All the positives are listed in BOLD  Review of Systems:  HEENT: Headache, blurred vision, runny nose, sore throat Neck: Hypothyroidism, hyperthyroidism,,lymphadenopathy Chest : Shortness of breath, history of COPD, Asthma Heart : Chest pain, history of coronary arterey disease GI:  Nausea, vomiting, diarrhea, constipation, GERD GU: Dysuria, urgency, frequency of urination, hematuria Neuro: Stroke, seizures, syncope Psych: Depression, anxiety, hallucinations   Physical Exam: Blood pressure 137/77, pulse 50, temperature 99.1 F (37.3 C), temperature source Oral, resp. rate 18, height '5\' 6"'$  (1.676 m), weight 66.225 kg (146 lb), SpO2 95 %. Constitutional:   Patient is a well-developed and well-nourished female in no acute distress and cooperative with exam. Head: Normocephalic and atraumatic Mouth: Mucus membranes moist Eyes: PERRL, EOMI, conjunctivae normal Neck: Supple, No Thyromegaly Cardiovascular: RRR, S1 normal, S2 normal Pulmonary/Chest: CTAB, no wheezes, rales, or rhonchi Abdominal: Soft. Positive right upper quadrant tenderness to palpation , non-distended, bowel sounds are normal, no masses, organomegaly,  Neurological: A&O x3, Strength is normal and symmetric bilaterally, cranial nerve II-XII are grossly intact, no focal motor deficit, sensory intact to light touch bilaterally.  Extremities : No Cyanosis, Clubbing or Edema  Labs on Admission:  Basic Metabolic Panel:  Recent Labs Lab 10/04/15 1105 10/05/15 2145  NA  --  139  K  --  3.6  CL  --  105  CO2  --  24  GLUCOSE  --  122*  BUN  --  15  CREATININE 1.90* 1.83*  CALCIUM  --  9.3   Liver Function Tests:  Recent Labs Lab 10/05/15 2145  AST 26  ALT 10*  ALKPHOS 36*  BILITOT 0.4  PROT 7.1  ALBUMIN 4.3      Recent Labs Lab 10/05/15 2145  LIPASE 32   No results for input(s): AMMONIA in the last 168 hours. CBC:  Recent Labs Lab 10/05/15 2145  WBC 7.7  NEUTROABS 6.2  HGB 10.1*  HCT 30.8*  MCV 103.0*  PLT 332   Cardiac Enzymes:  No results for input(s): CKTOTAL, CKMB, CKMBINDEX, TROPONINI in the last 168 hours.  BNP (last 3 results)  Recent Labs  11/15/14 2043  BNP 280.0*     Radiological Exams on Admission: Ct Abdomen Pelvis Wo Contrast  10/06/2015  CLINICAL DATA:  Dull right-sided abdominal pain, onset this morning. Nausea and chills. EXAM: CT ABDOMEN AND PELVIS WITHOUT CONTRAST TECHNIQUE: Multidetector CT imaging of the abdomen and pelvis was performed following the standard protocol without IV contrast. COMPARISON:  10/04/2015 FINDINGS: The gallbladder is distended, and there is an 11 mm calculus in the gallbladder neck. There is mild stranding around the gallbladder and a suggestion of gallbladder mural thickening. The findings may represent acute cholecystitis. No extrahepatic bile duct dilatation. There is mild right hydronephrosis without visible ureteral calculus. Left collecting system is mildly fold but not frankly hydronephrotic. Otherwise unremarkable appearances of the kidneys. No urinary calculi are evident. 2.7 cm low-attenuation liver lesion in the left lobe anteriorly, as well as a 1.7 cm low-attenuation lesion in the right hepatic lobe superiorly. These are indeterminate. There are unremarkable unenhanced appearances of the spleen and pancreas. There is a 2.8 cm left adrenal nodule. Extensive prior bowel surgery. No evidence of bowel obstruction. No extraluminal air. Abdominal aortic graft, appearing intact on this unenhanced scan. No significant skeletal lesion. Unchanged consolidation in the left lung base medially. IMPRESSION: 1. New gallbladder distention, mural thickening and adjacent stranding. Cholelithiasis. This may represent acute cholecystitis. Recommend  right upper quadrant sonography to evaluate. 2. Mild right hydronephrosis and mild fullness of the left renal collecting system. No ureteral calculi are evident. 3. Low-attenuation liver lesions, not characterized. Left adrenal nodule, not characterized. Prior studies not currently available to assess stability. Electronically Signed   By: Andreas Newport M.D.   On: 10/06/2015 00:36   Ct Abdomen Pelvis Wo Contrast  10/04/2015  CLINICAL DATA:  Lung cancer.  Chemotherapy and radiation therapy. EXAM: CT CHEST, ABDOMEN AND PELVIS WITHOUT CONTRAST TECHNIQUE: Multidetector CT imaging of the chest, abdomen and pelvis was performed following the standard protocol without IV contrast. COMPARISON:  CT chest 04/13/2015 and CT abdomen pelvis 08/27/2014. FINDINGS: CT CHEST FINDINGS Mediastinum/Lymph Nodes: Right IJ Port-A-Cath terminates in the SVC. No pathologically enlarged mediastinal or axillary lymph nodes. Hilar regions are difficult to definitively evaluate without IV contrast. Pulmonary arteries are enlarged. Coronary artery calcification. Heart is at the upper limits of normal in size to mildly enlarged. Small amount of pericardial fluid may be physiologic. Pre pericardiac lymph nodes are sub cm in short axis size. Lungs/Pleura: Minimal biapical pleural parenchymal scarring. Moderate centrilobular emphysema. Minimal paraseptal emphysema. Post treatment collapse/ consolidation bronchiectasis in the medial aspect of the left hemi thorax. Small left pleural effusion, slightly decreased. No right pleural fluid. There is narrowing of the left lower lobe bronchi. Airway is otherwise unremarkable. Musculoskeletal: No worrisome lytic or sclerotic lesions. CT ABDOMEN PELVIS FINDINGS Hepatobiliary: Intermediate attenuation lesion in segment 4 of the liver measures 2.9 cm (2/55), previously 1.9 cm on 08/27/2014 and 11 mm on 10/24/2013. 2.0 cm low-attenuation lesion in the dome of the liver (2/48), stable. Stone is seen in the  gallbladder. No biliary ductal dilatation. Pancreas: Negative. Spleen: Negative. Adrenals/Urinary Tract: Right adrenal gland is unremarkable. Left adrenal mass measures 2.5 x 2.9 cm and is fluid in density, stable. Kidneys are unremarkable. Ureters are decompressed. Bladder is somewhat low in volume. Stomach/Bowel: Stomach and proximal small bowel are unremarkable. Subtotal colectomy. Rectosigmoid colon is unremarkable. Vascular/Lymphatic: Atherosclerotic calcification of the arterial vasculature  with an infrarenal aortic aneurysm repair. Infrarenal aorta measures up to 3.0 cm, stable. Retroperitoneal lymph nodes measure up to 10 mm in the left periaortic station, previously 7 mm. Reproductive: Hysterectomy.  Ovaries are atrophic. Other: Scattered surgical clips in the abdomen. Mesenteries and peritoneum are otherwise unremarkable. No free fluid. Musculoskeletal: No worrisome lytic or sclerotic lesions. Degenerative changes are seen in the spine. IMPRESSION: 1. Enlarging intermediate density segment 4 liver lesion. Metastatic disease cannot be excluded. Consider further evaluation with repeat MR abdomen (preferably without and with contrast), as clinically indicated. 2. Posttreatment changes in the left hemi thorax. Associated small left pleural effusion, slightly decreased. 3. Enlarged pulmonary arteries, indicative of pulmonary arterial hypertension. 4. Coronary artery calcification. 5. Cholelithiasis. 6. Left adrenal adenoma. Electronically Signed   By: Lorin Picket M.D.   On: 10/04/2015 13:57   Ct Chest Wo Contrast  10/04/2015  CLINICAL DATA:  Lung cancer.  Chemotherapy and radiation therapy. EXAM: CT CHEST, ABDOMEN AND PELVIS WITHOUT CONTRAST TECHNIQUE: Multidetector CT imaging of the chest, abdomen and pelvis was performed following the standard protocol without IV contrast. COMPARISON:  CT chest 04/13/2015 and CT abdomen pelvis 08/27/2014. FINDINGS: CT CHEST FINDINGS Mediastinum/Lymph Nodes: Right IJ  Port-A-Cath terminates in the SVC. No pathologically enlarged mediastinal or axillary lymph nodes. Hilar regions are difficult to definitively evaluate without IV contrast. Pulmonary arteries are enlarged. Coronary artery calcification. Heart is at the upper limits of normal in size to mildly enlarged. Small amount of pericardial fluid may be physiologic. Pre pericardiac lymph nodes are sub cm in short axis size. Lungs/Pleura: Minimal biapical pleural parenchymal scarring. Moderate centrilobular emphysema. Minimal paraseptal emphysema. Post treatment collapse/ consolidation bronchiectasis in the medial aspect of the left hemi thorax. Small left pleural effusion, slightly decreased. No right pleural fluid. There is narrowing of the left lower lobe bronchi. Airway is otherwise unremarkable. Musculoskeletal: No worrisome lytic or sclerotic lesions. CT ABDOMEN PELVIS FINDINGS Hepatobiliary: Intermediate attenuation lesion in segment 4 of the liver measures 2.9 cm (2/55), previously 1.9 cm on 08/27/2014 and 11 mm on 10/24/2013. 2.0 cm low-attenuation lesion in the dome of the liver (2/48), stable. Stone is seen in the gallbladder. No biliary ductal dilatation. Pancreas: Negative. Spleen: Negative. Adrenals/Urinary Tract: Right adrenal gland is unremarkable. Left adrenal mass measures 2.5 x 2.9 cm and is fluid in density, stable. Kidneys are unremarkable. Ureters are decompressed. Bladder is somewhat low in volume. Stomach/Bowel: Stomach and proximal small bowel are unremarkable. Subtotal colectomy. Rectosigmoid colon is unremarkable. Vascular/Lymphatic: Atherosclerotic calcification of the arterial vasculature with an infrarenal aortic aneurysm repair. Infrarenal aorta measures up to 3.0 cm, stable. Retroperitoneal lymph nodes measure up to 10 mm in the left periaortic station, previously 7 mm. Reproductive: Hysterectomy.  Ovaries are atrophic. Other: Scattered surgical clips in the abdomen. Mesenteries and peritoneum  are otherwise unremarkable. No free fluid. Musculoskeletal: No worrisome lytic or sclerotic lesions. Degenerative changes are seen in the spine. IMPRESSION: 1. Enlarging intermediate density segment 4 liver lesion. Metastatic disease cannot be excluded. Consider further evaluation with repeat MR abdomen (preferably without and with contrast), as clinically indicated. 2. Posttreatment changes in the left hemi thorax. Associated small left pleural effusion, slightly decreased. 3. Enlarged pulmonary arteries, indicative of pulmonary arterial hypertension. 4. Coronary artery calcification. 5. Cholelithiasis. 6. Left adrenal adenoma. Electronically Signed   By: Lorin Picket M.D.   On: 10/04/2015 13:57    EKG: Independently reviewed. Normal sinus rhythm   Assessment/Plan Active Problems:   Squamous cell carcinoma of left lung (  [place])   Moderate dementia without behavioral disturbance   Malignant neoplasm of lower lobe of left lung ([place])   Acute cholecystitis   Acute cholecystitis We will admit the patient to [facility] Zosyn per pharmacy consultation Will obtain abdominal ultrasound in a.m. General surgery has been consulted by the ED physician. Dr. Kieth Brightly to [facility]  Hypertension Continue amlodipine, Vasotec  Squamous cell carcinoma of lung Patient has squamous cell carcinoma of the lung, with questionable liver lesions Patient followed by oncology as outpatient  Dementia, without behavior disturbance Continue Aricept, Seroquel  Will transfer the patient [facility]. Dr. Glennon [name] is accepting physician  Code status: Full code  Family discussion: Admission, patients condition and plan of care including tests being ordered have been discussed with the patient and her daughters at bedside* who indicate understanding and agree with the plan and Code Status.   Time Spent on Admission: 60 min  [facility] Pager:  [phone] 10/06/2015, 2:05 AM  If 7PM-7AM, please contact night-coverage  [email or web address]  Password [email or web address]

## 2015-10-06 NOTE — ED Notes (Signed)
Physician in to discuss with pt and family

## 2015-10-06 NOTE — Progress Notes (Signed)
NURSING PROGRESS NOTE  [name] [id number] Admission Data: 10/06/2015 03:37 AM Attending Provider: No att. providers found FQH:[id number] Wolfgang Phoenix, MD Code Status: FULL  Allergies:  Aleve; Dilaudid; Dyazide; and Zithromax Past Medical History:   has a past medical history of Hypertension; Hypothyroidism; Hyperlipidemia; Insomnia; GERD (gastroesophageal reflux disease); Adrenal adenoma; Chronic diarrhea; Fatty liver; Arthritis; QT prolongation; Coronary artery disease; Impaired fasting glucose; Renal insufficiency; Tobacco use; Family history of heart disease; IBS (irritable bowel syndrome); Peripheral arterial disease ([place]); Cancer ([place]); Squamous cell carcinoma of left lung ([place]) (07/29/2013); HCAP (healthcare-associated pneumonia); Acute respiratory failure (HCC); COPD (chronic obstructive pulmonary disease) ([place]); and Pleural effusion. Past Surgical History:   has past surgical history that includes Abdominal hysterectomy; Thyroidectomy, partial; Colonoscopy (5/04); Abdominal aortic aneurysm repair (07/2006); Cataract extraction (Bilateral, 2010); Cardiac catheterization (02/2010); Colon resection (2005); transthoracic echocardiogram (02/2010); Colonoscopy with esophagogastroduodenoscopy (egd) (N/A, 03/19/2013); and DG BIOPSY LUNG (Left, Jan 2015). Social History:   reports that she has quit smoking. Her smoking use included Cigarettes. She has a 29.5 pack-year smoking history. She has never used smokeless tobacco. She reports that she does not drink alcohol or use illicit drugs.  [name] is a 81 y.o. female patient admitted from ED:   Last Documented Vital Signs: Blood pressure 150/69, pulse 78, temperature 99.5 F (37.5 C), temperature source Oral, resp. rate 16, height '5\' 7"'$  (1.702 m), weight 65.363 kg (144 lb 1.6 oz), SpO2 100 %.  Cardiac Monitoring: N/A  IV Fluids:  IV in place, occlusive dsg intact without redness, IV cath antecubital left, condition patent and no redness normal saline.    Skin: WDL- feet dry  Patient/Family orientated to room. Information packet given to patient/family. Admission inpatient armband information verified with patient/family to include name and date of birth and placed on patient arm. Side rails up x 2, fall assessment and education completed with patient/family. Patient/family able to verbalize understanding of risk associated with falls and verbalized understanding to call for assistance before getting out of bed. Call light within reach. Patient/family able to voice and demonstrate understanding of unit orientation instructions.    Will continue to evaluate and treat per MD orders.   Amaryllis Dyke, RN

## 2015-10-06 NOTE — Progress Notes (Signed)
[facility] PROGRESS NOTE  Temara Lanum ASN:[id number] DOB: [birth year] DOA: 10/05/2015 PCP: Mickie Hillier, MD  HPI/Brief narrative 81 who presented with complaints of right upper quadrant abdominal pain which has been going on for past few days prior to admission. Patient found to have gallbladder distention, mural thickening and adjacent stranding with cholelithiasis.  Assessment/Plan: Acute cholecystitis General surgery consulted and following Continued on Zosyn HIDA scan done per surgery recs with findings c/w acute cholecystitis.  Hypertension Continue amlodipine, Vasotec  Squamous cell carcinoma of lung Patient has squamous cell carcinoma of the lung, with questionable liver lesions Patient followed by oncology as outpatient  Dementia, without behavior disturbance Continue Aricept, Seroquel  Code status: Full code  Family Communication: Pt in room, family at bedside Disposition Plan: Uncertain at this point   Consultants:  General Surgery  Procedures:    Antibiotics: Anti-infectives    Start     Dose/Rate Route Frequency Ordered Stop   10/06/15 0600  piperacillin-tazobactam (ZOSYN) IVPB 3.375 g     3.375 g 12.5 mL/hr over 240 Minutes Intravenous 3 times per day 10/06/15 0354     10/06/15 0100  piperacillin-tazobactam (ZOSYN) IVPB 3.375 g     3.375 g 100 mL/hr over 30 Minutes Intravenous  Once 10/06/15 0048 10/06/15 0309      HPI/Subjective: Feels better today. Asking about starting PO  Objective: Filed Vitals:   10/06/15 0400 10/06/15 0421 10/06/15 1447 10/06/15 1612  BP:  150/69 128/58 137/71  Pulse:  78 76   Temp:  99.5 F (37.5 C) 99.3 F (37.4 C)   TempSrc:  Oral Oral   Resp:  16 20   Height: '5\' 7"'$  (1.702 m)     Weight: 65.363 kg (144 lb 1.6 oz)     SpO2:  100% 100%     Intake/Output Summary (Last 24 hours) at 10/06/15 1827 Last data filed at 10/06/15 1330  Gross per 24 hour  Intake      0 ml  Output    500 ml  Net   -500 ml    Filed Weights   10/05/15 2051 10/06/15 0400  Weight: 66.225 kg (146 lb) 65.363 kg (144 lb 1.6 oz)    Exam:   General:  Awake, in nad  Cardiovascular: regular, s1, s2  Respiratory: normal resp effort, no wheezing  Abdomen: soft, nondistended, pos BS  Musculoskeletal: perfused, no clubbing   Data Reviewed: Basic Metabolic Panel:  Recent Labs Lab 10/04/15 1105 10/05/15 2145 10/06/15 0812  NA  --  139 140  K  --  3.6 3.6  CL  --  105 105  CO2  --  24 24  GLUCOSE  --  122* 99  BUN  --  15 11  CREATININE 1.90* 1.83* 1.66*  CALCIUM  --  9.3 9.3   Liver Function Tests:  Recent Labs Lab 10/05/15 2145 10/06/15 0812  AST 26 22  ALT 10* 10*  ALKPHOS 36* 34*  BILITOT 0.4 0.8  PROT 7.1 6.0*  ALBUMIN 4.3 3.4*    Recent Labs Lab 10/05/15 2145  LIPASE 32   No results for input(s): AMMONIA in the last 168 hours. CBC:  Recent Labs Lab 10/05/15 2145 10/06/15 0812  WBC 7.7 6.5  NEUTROABS 6.2  --   HGB 10.1* 9.7*  HCT 30.8* 29.7*  MCV 103.0* 102.8*  PLT 332 299   Cardiac Enzymes: No results for input(s): CKTOTAL, CKMB, CKMBINDEX, TROPONINI in the last 168 hours. BNP (last 3 results)  Recent Labs  11/15/14 2043  BNP 280.0*    ProBNP (last 3 results) No results for input(s): PROBNP in the last 8760 hours.  CBG: No results for input(s): GLUCAP in the last 168 hours.  No results found for this or any previous visit (from the past 240 hour(s)).   Studies: Ct Abdomen Pelvis Wo Contrast  10/06/2015  ADDENDUM REPORT: 10/06/2015 02:13 ADDENDUM: Prior reports have been obtained. The left hepatic lobe liver lesion is enlarged from 08/27/2014, as reported on the 10/04/2015 CT. The right hepatic lobe dome lesion is stable. Nonemergent abdominal MRI without/with contrast would be helpful for further evaluation of the enlarging left hepatic lobe lesion, if clinically warranted. The left adrenal nodule is unchanged from 08/27/2014. Electronically Signed   By:  Andreas Newport M.D.   On: 10/06/2015 02:13  10/06/2015  CLINICAL DATA:  Dull right-sided abdominal pain, onset this morning. Nausea and chills. EXAM: CT ABDOMEN AND PELVIS WITHOUT CONTRAST TECHNIQUE: Multidetector CT imaging of the abdomen and pelvis was performed following the standard protocol without IV contrast. COMPARISON:  10/04/2015 FINDINGS: The gallbladder is distended, and there is an 11 mm calculus in the gallbladder neck. There is mild stranding around the gallbladder and a suggestion of gallbladder mural thickening. The findings may represent acute cholecystitis. No extrahepatic bile duct dilatation. There is mild right hydronephrosis without visible ureteral calculus. Left collecting system is mildly fold but not frankly hydronephrotic. Otherwise unremarkable appearances of the kidneys. No urinary calculi are evident. 2.7 cm low-attenuation liver lesion in the left lobe anteriorly, as well as a 1.7 cm low-attenuation lesion in the right hepatic lobe superiorly. These are indeterminate. There are unremarkable unenhanced appearances of the spleen and pancreas. There is a 2.8 cm left adrenal nodule. Extensive prior bowel surgery. No evidence of bowel obstruction. No extraluminal air. Abdominal aortic graft, appearing intact on this unenhanced scan. No significant skeletal lesion. Unchanged consolidation in the left lung base medially. IMPRESSION: 1. New gallbladder distention, mural thickening and adjacent stranding. Cholelithiasis. This may represent acute cholecystitis. Recommend right upper quadrant sonography to evaluate. 2. Mild right hydronephrosis and mild fullness of the left renal collecting system. No ureteral calculi are evident. 3. Low-attenuation liver lesions, not characterized. Left adrenal nodule, not characterized. Prior studies not currently available to assess stability. Electronically Signed: By: Andreas Newport M.D. On: 10/06/2015 00:36   US Abdomen Complete  10/06/2015   CLINICAL DATA:  Lung cancer. COPD. Findings suggestive of acute cholecystitis on CT study performed 1 day prior. EXAM: ABDOMEN ULTRASOUND COMPLETE COMPARISON:  10/05/2015 CT abdomen/ pelvis. 08/20/2006 abdominal sonogram. FINDINGS: Gallbladder: Nonmobile 1.4 cm calcified shadowing gallstone in the gallbladder neck. Mildly distended gallbladder. Mild to moderate diffuse gallbladder wall thickening. Trace pericholecystic fluid. No sonographic Murphy sign. Common bile duct: Diameter: 5 mm Liver: Hypodense 2.2 x 2.1 x 2.2 cm liver mass in the anterior left liver lobe. No additional liver masses are detected. Background liver parenchymal echogenicity and echotexture are within normal limits. IVC: No abnormality visualized. Pancreas: Visualized portion unremarkable. Spleen: Size and appearance within normal limits. Right Kidney: Length: 9.8 cm. Mildly echogenic right kidney. No right hydronephrosis. No right renal mass. Left Kidney: Length: 10.2 cm. Mildly echogenic left kidney. No left hydronephrosis. No left renal mass. Abdominal aorta: No aneurysm visualized. Other findings: None. IMPRESSION: 1. Nonmobile gallstone in the gallbladder neck with mild gallbladder distention, diffuse gallbladder wall thickening, trace pericholecystic fluid and no sonographic Murphy's sign. Sonographic findings are suspicious but not definitive for acute cholecystitis given the absence  of sonographic Murphy sign. Consider correlation with hepatobiliary scintigraphy as clinically warranted. 2. No biliary ductal dilatation. 3. Indeterminate hypodense 2.2 cm left liver lobe mass, liver metastasis not excluded. 4. No hydronephrosis. Echogenic kidneys, a nonspecific finding indicating renal parenchymal disease of uncertain chronicity. Electronically Signed   By: Ilona Sorrel M.D.   On: 10/06/2015 10:06   Nm Hepato W/eject Fract  10/06/2015  CLINICAL DATA:  81 year old female with nonmobile gallstone in the neck of the gallbladder with  ultrasound findings suspicious for acute cholecystitis discovered during imaging for right abdominal pain. Initial encounter. EXAM: NUCLEAR MEDICINE HEPATOBILIARY IMAGING TECHNIQUE: Sequential images of the abdomen were obtained out to 60 minutes following intravenous administration of radiopharmaceutical. RADIOPHARMACEUTICALS:  5.1 mCi Tc-76m Choletec IV COMPARISON:  Ultrasound 0647 hours today. CT Abdomen and Pelvis 10/05/2015 FINDINGS: Prompt radiotracer uptake by the liver and clearance of the blood pool. Prompt CBD and small bowel activity by 10 minutes. Over the first hour of the study, no gallbladder activity occurred. Subsequently, the study was augmented with morphine. 30 minutes of additional imaging occurred, with no gallbladder activity. IMPRESSION: Absent gallbladder activity despite augmentation of this study with morphine, further raising suspicion of Acute Cholecystitis. CBD is patent. Electronically Signed   By: HGenevie AnnM.D.   On: 10/06/2015 14:21    Scheduled Meds: . allopurinol  300 mg Oral Daily  . ALPRAZolam  1 mg Oral TID  . amLODipine  5 mg Oral Daily  . aspirin EC  81 mg Oral Daily  . [START ON 10/07/2015] donepezil  5 mg Oral QHS  . DULoxetine  60 mg Oral Daily  . enoxaparin (LOVENOX) injection  30 mg Subcutaneous Q24H  . folic acid  1 mg Oral Daily  . hydrALAZINE  25 mg Oral TID  . morphine      . piperacillin-tazobactam (ZOSYN)  IV  3.375 g Intravenous 3 times per day  . QUEtiapine  25 mg Oral QHS   Continuous Infusions: . sodium chloride 75 mL/hr at 10/06/15 0405    Active Problems:   Squamous cell carcinoma of left lung (HCC)   Moderate dementia without behavioral disturbance   Malignant ne[place] of [place] lobe of left [place]   Acute cholecystitis   [name], [facility] Pager [phone] If 7PM-7AM, please contact night-coverage at [email or web address], password [email or web address]/05/2016, 6:27 PM  LOS: 0 days

## 2015-10-06 NOTE — Progress Notes (Signed)
Pharmacy Antibiotic Note  [name] is a 81 y.o. female admitted on 10/05/2015 with Abdominal pain.  Pharmacy has been consulted for Zosyn dosing. CT with cholecystitis. WBC WNL. Noted renal dysfunction.   Plan: Zosyn 3.375G IV q8h to be infused over 4 hours F/U infectious work-up  Height: '5\' 6"'$  (167.6 cm) Weight: 146 lb (66.225 kg) IBW/kg (Calculated) : 59.3  Temp (24hrs), Avg:99.1 F (37.3 C), Min:99.1 F (37.3 C), Max:99.1 F (37.3 C)   Recent Labs Lab 10/04/15 1105 10/05/15 2145  WBC  --  7.7  CREATININE 1.90* 1.83*    Estimated Creatinine Clearance: 23.7 mL/min (by C-G formula based on Cr of 1.83).    Allergies  Allergen Reactions  . Aleve [Naproxen Sodium] Other (See Comments)    GI bleed  . Dilaudid [Hydromorphone Hcl]     Can not tolerate  . Dyazide [Hydrochlorothiazide W-Triamterene] Other (See Comments)    cramps  . Zithromax [Azithromycin] Itching    Narda Bonds 10/06/2015 3:49 AM

## 2015-10-06 NOTE — ED Notes (Signed)
Return from CT

## 2015-10-06 NOTE — Progress Notes (Signed)
Initial Nutrition Assessment  DOCUMENTATION CODES:   Non-severe (moderate) malnutrition in context of chronic illness  INTERVENTION:  -Ensure Enlive BID. Each supplement provides 350 kcals and 20 grams of protein. -Continue to monitor nutritional needs  NUTRITION DIAGNOSIS:   Increased nutrient needs related to cancer and cancer related treatments as evidenced by estimated needs.  GOAL:   Patient will meet greater than or equal to 90% of their needs  MONITOR:   PO intake, Supplement acceptance, Labs, Weight trends, Skin, I & O's  REASON FOR ASSESSMENT:   Malnutrition Screening Tool    ASSESSMENT:   81 year old female who  has a past medical history of Hypertension; Hypothyroidism; Hyperlipidemia; Insomnia; GERD (gastroesophageal reflux disease); Adrenal adenoma; Chronic diarrhea; Fatty liver; Arthritis; QT prolongation; Coronary artery disease; Impaired fasting glucose; Renal insufficiency; Tobacco use; Family history of heart disease; IBS (irritable bowel syndrome); Peripheral arterial disease ([place]); Cancer ([place]); Squamous cell carcinoma of left lung ([place]) (07/29/2013); HCAP (healthcare-associated pneumonia); Acute respiratory failure (HCC); COPD (chronic obstructive pulmonary disease) ([place]); and Pleural effusion.  Pt seen for MST. Pt BMI categorized as healthy. Per chart and pt report, pt weight stable for past 6 months. Pt eats TID. Pt has had a decline in appetite over the past 6 months but is continuing to eat a normal intake. Pt denies N/V and abdominal pain. Pt is currently NPO d/t procedure. Pt states she is hungry and thirsty. Pt amenable to receiving Ensure while in the hospital. Per chart, pt with being followed by outpatient oncology for [facility] of left lung. Pt with mild malnutrition suspected d/t cancer. Will order Ensure once diet is advanced for pt to increase protein and kcals for adequate nutrition.   NFPE: mild muscle depletion, mild fat depletion, no edema.  Labs  reviewed; creatinine 1.66 mg/dl, GFR 28 ml/min. Meds reviewed; folic acid 1 mg.   Diet Order:  Diet NPO time specified Except for: Sips with Meds  Skin:  Reviewed, no issues  Last BM:  4/11  Height:   Ht Readings from Last 1 Encounters:  10/06/15 '5\' 7"'$  (1.702 m)    Weight:   Wt Readings from Last 1 Encounters:  10/06/15 144 lb 1.6 oz (65.363 kg)    Ideal Body Weight:  61 kg  BMI:  Body mass index is 22.56 kg/(m^2).  Estimated Nutritional Needs:   Kcal:  1600-1800 (24-26 kcals/kg)  Protein:  85-95 g (1.4-1.5 g/kg)  Fluid:  1.6-1.8 L  EDUCATION NEEDS:   No education needs identified at this time  Geoffery Lyons, [place] Dietetic Intern Pager [phone]

## 2015-10-06 NOTE — Progress Notes (Signed)
Received report from [place], [place]- ED for transfer of pt to [phone].

## 2015-10-07 ENCOUNTER — Ambulatory Visit: Payer: Medicare Other | Admitting: Family Medicine

## 2015-10-07 ENCOUNTER — Inpatient Hospital Stay (HOSPITAL_COMMUNITY): Payer: Medicare Other

## 2015-10-07 DIAGNOSIS — K810 Acute cholecystitis: Secondary | ICD-10-CM

## 2015-10-07 DIAGNOSIS — F0390 Unspecified dementia without behavioral disturbance: Secondary | ICD-10-CM

## 2015-10-07 DIAGNOSIS — C3492 Malignant neoplasm of unspecified part of left bronchus or lung: Secondary | ICD-10-CM

## 2015-10-07 LAB — COMPREHENSIVE METABOLIC PANEL
ALBUMIN: 3.1 g/dL — AB (ref 3.5–5.0)
ALT: 10 U/L — ABNORMAL LOW (ref 14–54)
ANION GAP: 12 (ref 5–15)
AST: 20 U/L (ref 15–41)
Alkaline Phosphatase: 25 U/L — ABNORMAL LOW (ref 38–126)
BUN: 12 mg/dL (ref 6–20)
CHLORIDE: 105 mmol/L (ref 101–111)
CO2: 22 mmol/L (ref 22–32)
Calcium: 8.8 mg/dL — ABNORMAL LOW (ref 8.9–10.3)
Creatinine, Ser: 1.67 mg/dL — ABNORMAL HIGH (ref 0.44–1.00)
GFR calc Af Amer: 33 mL/min — ABNORMAL LOW (ref >60)
GFR calc non Af Amer: 28 mL/min — ABNORMAL LOW (ref >60)
GLUCOSE: 80 mg/dL (ref 65–99)
POTASSIUM: 3.9 mmol/L (ref 3.5–5.1)
SODIUM: 139 mmol/L (ref 135–145)
TOTAL PROTEIN: 5.4 g/dL — AB (ref 6.5–8.1)
Total Bilirubin: 0.8 mg/dL (ref 0.3–1.2)

## 2015-10-07 LAB — GRAM STAIN

## 2015-10-07 LAB — APTT: aPTT: 39 seconds — ABNORMAL HIGH (ref 24–37)

## 2015-10-07 LAB — PROTIME-INR
INR: 1.23 (ref 0.00–1.49)
Prothrombin Time: 15.7 seconds — ABNORMAL HIGH (ref 11.6–15.2)

## 2015-10-07 MED ORDER — FENTANYL CITRATE (PF) 100 MCG/2ML IJ SOLN
INTRAMUSCULAR | Status: AC | PRN
  Administered 2015-10-07 (×2): 25 ug via INTRAVENOUS

## 2015-10-07 MED ORDER — LIDOCAINE HCL 1 % IJ SOLN
INTRAMUSCULAR | Status: AC
  Filled 2015-10-07: qty 20

## 2015-10-07 MED ORDER — ENOXAPARIN SODIUM 30 MG/0.3ML ~~LOC~~ SOLN
30.0000 mg | SUBCUTANEOUS | Status: DC
  Administered 2015-10-09 – 2015-10-11 (×3): 30 mg via SUBCUTANEOUS
  Filled 2015-10-07 (×4): qty 0.3

## 2015-10-07 MED ORDER — MIDAZOLAM HCL 2 MG/2ML IJ SOLN
INTRAMUSCULAR | Status: AC
  Filled 2015-10-07: qty 4

## 2015-10-07 MED ORDER — FENTANYL CITRATE (PF) 100 MCG/2ML IJ SOLN
INTRAMUSCULAR | Status: AC
  Filled 2015-10-07: qty 4

## 2015-10-07 MED ORDER — MIDAZOLAM HCL 2 MG/2ML IJ SOLN
INTRAMUSCULAR | Status: AC | PRN
  Administered 2015-10-07: 0.5 mg via INTRAVENOUS

## 2015-10-07 NOTE — Consult Note (Signed)
Chief Complaint: Patient was seen in consultation today for percutaneous cholecystostomy drain placement Chief Complaint  Patient presents with  . Abdominal Pain   at the request of Dr Greer Pickerel  Referring Physician(s): Dr Greer Pickerel  Supervising Physician: Aletta Edouard  History of Present Illness: [name] is a 81 y.o. female   Pt with Hx Lung Ca and now with apparent liver metastasis RUQ pain x few days [place] 10/06/15: IMPRESSION: 1. Nonmobile gallstone in the gallbladder neck with mild gallbladder distention, diffuse gallbladder wall thickening, trace pericholecystic fluid and no sonographic Murphy's sign. Sonographic findings are suspicious but not definitive for acute cholecystitis given the absence of sonographic Murphy sign. Consider correlation with hepatobiliary scintigraphy as clinically warranted. 2. No biliary ductal dilatation. 3. Indeterminate hypodense 2.2 cm left liver lobe mass, liver metastasis not excluded. 4. No hydronephrosis. Echogenic kidneys, a nonspecific finding indicating renal parenchymal disease of uncertain chronicity.  HIDA 4/12: IMPRESSION: Absent gallbladder activity despite augmentation of this study with morphine, further raising suspicion of Acute Cholecystitis.  CBD is patent.  Low grade temp Normal wbc  Pt has been evaluated by Dr Tedra Coupe not good candidate for gallbladder surgery with Lung ca and probable mets Has requested percutaneous cholecystostomy drain placement Dr Kathlene Cote has reviewed imaging and approves procedure  Lovenox held today  Past Medical History  Diagnosis Date  . Hypertension   . Hypothyroidism   . Hyperlipidemia   . Insomnia   . GERD (gastroesophageal reflux disease)     chronic gastritis  . Adrenal adenoma   . Chronic diarrhea   . Fatty liver   . Arthritis     knee  . QT prolongation     syncope  . Coronary artery disease   . Impaired fasting glucose   . Renal insufficiency      low protien diet  . Tobacco use     1/2 ppd, approx 25-50 pack years (as of 08/2012)  . Family history of heart disease   . IBS (irritable bowel syndrome)   . Peripheral arterial disease ([place])     sstatus post  infrarenal abdominal aortic tube graft placed by Dr. Victorino Dike February 2008  . Cancer ([place])     Lung  . Squamous cell carcinoma of left lung ([place]) 07/29/2013  . HCAP (healthcare-associated pneumonia)     10/2014  . Acute respiratory failure ([place])     10/2014  . COPD (chronic obstructive pulmonary disease) ([place])     moderat  . Pleural effusion     Past Surgical History  Procedure Laterality Date  . Abdominal hysterectomy    . Thyroidectomy, partial    . Colonoscopy  5/04    normal  . Abdominal aortic aneurysm repair  07/2006    D. J.D. Kellie Simmering  . Cataract extraction Bilateral 2010  . Cardiac catheterization  02/2010    mod CAD in L-dominant system with normal LV function  . Colon resection  2005    1/2 colon removed  . Transthoracic echocardiogram  02/2010    EF 55-60%; mild conc LVH, normal systolic function; mildly calcified AV annulus  . Colonoscopy with esophagogastroduodenoscopy (egd) N/A 03/19/2013    Procedure: COLONOSCOPY WITH ESOPHAGOGASTRODUODENOSCOPY (EGD);  Surgeon: Rogene Houston, MD;  Location: [facility];  Service: Endoscopy;  Laterality: N/A;  145  . Dg biopsy lung Left Jan 2015    Allergies: Aleve; Dilaudid; Dyazide; and Zithromax  Medications: Prior to Admission medications   Medication Sig Start Date  End Date Taking? Authorizing Provider  acetaminophen (TYLENOL) 500 MG tablet Take 500 mg by mouth every 6 (six) hours as needed for mild pain or moderate pain.   Yes Historical Provider, MD  allopurinol (ZYLOPRIM) 300 MG tablet take 1 tablet by mouth once daily 04/21/15  Yes Mikey Kirschner, MD  ALPRAZolam (XANAX) 1 MG tablet TAKE ONE-HALF TO ONE TABLET BY MOUTH THREE TIMES DAILY AS NEEDED FOR ANXIETY Patient taking differently: TAKE ONE TABLET BY  MOUTH THREE TIMES DAILY 08/02/15  Yes Mikey Kirschner, MD  amLODipine (NORVASC) 5 MG tablet take 1 tablet by mouth once daily 06/29/15  Yes Kathyrn Drown, MD  aspirin EC 81 MG tablet Take 81 mg by mouth daily.   Yes Historical Provider, MD  donepezil (ARICEPT) 10 MG tablet Take 1 tablet daily 08/02/15  Yes Cameron Sprang, MD  DULoxetine (CYMBALTA) 60 MG capsule take 1 capsule by mouth once daily 08/30/15  Yes Mikey Kirschner, MD  enalapril (VASOTEC) 5 MG tablet Take 5 mg by mouth daily. 08/19/15  Yes Historical Provider, MD  fenofibrate 160 MG tablet take 1 tablet by mouth once daily with food 09/21/15  Yes Mikey Kirschner, MD  ferrous sulfate (FERROUSUL) 325 (65 FE) MG tablet Take 1 tablet (325 mg total) by mouth 2 (two) times daily after a meal. 03/19/13  Yes Rogene Houston, MD  folic acid (FOLVITE) 1 MG tablet Take 1 tablet (1 mg total) by mouth daily. 08/03/15  Yes Mikey Kirschner, MD  hydrALAZINE (APRESOLINE) 25 MG tablet take 1 tablet by mouth three times a day 04/26/15  Yes Mikey Kirschner, MD  HYDROcodone-acetaminophen (NORCO/VICODIN) 5-325 MG tablet Take 1 tablet twice a day as needed for pain Patient taking differently: Take 1 tablet by mouth 2 (two) times daily as needed for moderate pain or severe pain. Take 1 tablet twice a day as needed for pain 06/14/15  Yes Mikey Kirschner, MD  levothyroxine (SYNTHROID, LEVOTHROID) 137 MCG tablet take 1 tablet by mouth once daily 04/23/15  Yes Mikey Kirschner, MD  lidocaine-prilocaine (EMLA) cream Apply 1 application topically as needed (port).   Yes Historical Provider, MD  loperamide (IMODIUM) 2 MG capsule Take 2 mg by mouth 3 (three) times daily as needed for diarrhea or loose stools.    Yes Historical Provider, MD  meclizine (ANTIVERT) 25 MG tablet Take 1 tablet (25 mg total) by mouth 3 (three) times daily as needed for dizziness. 08/19/15  Yes Mikey Kirschner, MD  Menthol-Methyl Salicylate (MUSCLE RUB) 10-15 % CREA Apply 1 application topically as  needed for muscle pain.   Yes Historical Provider, MD  ondansetron (ZOFRAN-ODT) 4 MG disintegrating tablet dissolve 1 tablet ON TONGUE every 8 hours if needed for nausea and vomiting 08/30/15  Yes Mikey Kirschner, MD  pantoprazole (PROTONIX) 40 MG tablet Take 1 tablet (40 mg total) by mouth daily. 07/22/15  Yes Mikey Kirschner, MD  potassium chloride (K-DUR) 10 MEQ tablet Take 2 tabs daily Patient taking differently: Take 20 mEq by mouth daily. Take 2 tabs daily 04/08/15  Yes Mikey Kirschner, MD  pravastatin (PRAVACHOL) 20 MG tablet take 1 tablet by mouth once daily 05/04/15  Yes Mikey Kirschner, MD  QUEtiapine (SEROQUEL) 25 MG tablet Take 1 tablet (25 mg total) by mouth at bedtime. 08/02/15  Yes Cameron Sprang, MD  SPIRIVA HANDIHALER 18 MCG inhalation capsule Place 18 mcg into inhaler and inhale as needed (for shortness of breath).  11/14/14  Yes Historical Provider, MD  traMADol (ULTRAM) 50 MG tablet take 1 tablet by mouth every 6 hours if needed Patient taking differently: Take 50 mg by mouth every 6 (six) hours as needed for moderate pain.  06/14/15  Yes Mikey Kirschner, MD  Vitamin D, Ergocalciferol, (DRISDOL) 50000 UNITS CAPS capsule Take 1 capsule (50,000 Units total) by mouth once a week. 06/14/15  Yes Mikey Kirschner, MD  amoxicillin (AMOXIL) 500 MG capsule Take 1 capsule (500 mg total) by mouth 3 (three) times daily. X 10 days Patient not taking: Reported on 10/05/2015 09/24/15   Mikey Kirschner, MD  cefPROZIL (CEFZIL) 500 MG tablet Take 1 tablet (500 mg total) by mouth 2 (two) times daily. Patient not taking: Reported on 10/05/2015 09/10/15   Mikey Kirschner, MD     Family History  Problem Relation Age of Onset  . Hypertension Mother   . Diabetes Mother   . Heart attack Mother   . Hyperlipidemia Sister     x3 sister  . Kidney disease Brother     Social History   Social History  . Marital Status: Divorced    Spouse Name: N/A  . Number of Children: 2  . Years of Education: N/A     Occupational History  .     Social History Main Topics  . Smoking status: Former Smoker -- 0.50 packs/day for 59 years    Types: Cigarettes  . Smokeless tobacco: Never Used     Comment: smoking since age 10/18  . Alcohol Use: No  . Drug Use: No  . Sexual Activity: Not Asked   Other Topics Concern  . None   Social History Narrative     Review of Systems: A 12 point ROS discussed and pertinent positives are indicated in the HPI above.  All other systems are negative.  Review of Systems  Constitutional: Positive for activity change. Negative for fever.  Gastrointestinal: Positive for abdominal pain. Negative for nausea and abdominal distention.  Neurological: Negative for weakness.  Psychiatric/Behavioral: Negative for behavioral problems and confusion.    Vital Signs: BP 116/56 mmHg  Pulse 80  Temp(Src) 98.1 F (36.7 C) (Oral)  Resp 16  Ht '5\' 7"'$  (1.702 m)  Wt 144 lb 1.6 oz (65.363 kg)  BMI 22.56 kg/m2  SpO2 100%  Physical Exam  Constitutional: She is oriented to person, place, and time.  Cardiovascular: Normal rate and regular rhythm.   Pulmonary/Chest: Effort normal and breath sounds normal. She has no wheezes.  Abdominal: Soft. Bowel sounds are normal. There is tenderness.  Musculoskeletal: Normal range of motion.  Neurological: She is alert and oriented to person, place, and time.  Skin: Skin is warm and dry.  Psychiatric: She has a normal mood and affect. Her behavior is normal. Judgment and thought content normal.  Nursing note and vitals reviewed.   Mallampati Score:  MD Evaluation Airway: WNL Heart: WNL Abdomen: WNL Chest/ Lungs: WNL ASA  Classification: 3 Mallampati/Airway Score: One  Imaging: Ct Abdomen Pelvis Wo Contrast  10/06/2015  ADDENDUM REPORT: 10/06/2015 02:13 ADDENDUM: Prior reports have been obtained. The left hepatic lobe liver lesion is enlarged from 08/27/2014, as reported on the 10/04/2015 CT. The right hepatic lobe dome lesion  is stable. Nonemergent abdominal MRI without/with contrast would be helpful for further evaluation of the enlarging left hepatic lobe lesion, if clinically warranted. The left adrenal nodule is unchanged from 08/27/2014. Electronically Signed   By: Andreas Newport M.D.   On:  10/06/2015 02:13  10/06/2015  CLINICAL DATA:  Dull right-sided abdominal pain, onset this morning. Nausea and chills. EXAM: CT ABDOMEN AND PELVIS WITHOUT CONTRAST TECHNIQUE: Multidetector CT imaging of the abdomen and pelvis was performed following the standard protocol without IV contrast. COMPARISON:  10/04/2015 FINDINGS: The gallbladder is distended, and there is an 11 mm calculus in the gallbladder neck. There is mild stranding around the gallbladder and a suggestion of gallbladder mural thickening. The findings may represent acute cholecystitis. No extrahepatic bile duct dilatation. There is mild right hydronephrosis without visible ureteral calculus. Left collecting system is mildly fold but not frankly hydronephrotic. Otherwise unremarkable appearances of the kidneys. No urinary calculi are evident. 2.7 cm low-attenuation liver lesion in the left lobe anteriorly, as well as a 1.7 cm low-attenuation lesion in the right hepatic lobe superiorly. These are indeterminate. There are unremarkable unenhanced appearances of the spleen and pancreas. There is a 2.8 cm left adrenal nodule. Extensive prior bowel surgery. No evidence of bowel obstruction. No extraluminal air. Abdominal aortic graft, appearing intact on this unenhanced scan. No significant skeletal lesion. Unchanged consolidation in the left lung base medially. IMPRESSION: 1. New gallbladder distention, mural thickening and adjacent stranding. Cholelithiasis. This may represent acute cholecystitis. Recommend right upper quadrant sonography to evaluate. 2. Mild right hydronephrosis and mild fullness of the left renal collecting system. No ureteral calculi are evident. 3.  Low-attenuation liver lesions, not characterized. Left adrenal nodule, not characterized. Prior studies not currently available to assess stability. Electronically Signed: By: Andreas Newport M.D. On: 10/06/2015 00:36   Ct Abdomen Pelvis Wo Contrast  10/04/2015  CLINICAL DATA:  Lung cancer.  Chemotherapy and radiation therapy. EXAM: CT CHEST, ABDOMEN AND PELVIS WITHOUT CONTRAST TECHNIQUE: Multidetector CT imaging of the chest, abdomen and pelvis was performed following the standard protocol without IV contrast. COMPARISON:  CT chest 04/13/2015 and CT abdomen pelvis 08/27/2014. FINDINGS: CT CHEST FINDINGS Mediastinum/Lymph Nodes: Right IJ Port-A-Cath terminates in the SVC. No pathologically enlarged mediastinal or axillary lymph nodes. Hilar regions are difficult to definitively evaluate without IV contrast. Pulmonary arteries are enlarged. Coronary artery calcification. Heart is at the upper limits of normal in size to mildly enlarged. Small amount of pericardial fluid may be physiologic. Pre pericardiac lymph nodes are sub cm in short axis size. Lungs/Pleura: Minimal biapical pleural parenchymal scarring. Moderate centrilobular emphysema. Minimal paraseptal emphysema. Post treatment collapse/ consolidation bronchiectasis in the medial aspect of the left hemi thorax. Small left pleural effusion, slightly decreased. No right pleural fluid. There is narrowing of the left lower lobe bronchi. Airway is otherwise unremarkable. Musculoskeletal: No worrisome lytic or sclerotic lesions. CT ABDOMEN PELVIS FINDINGS Hepatobiliary: Intermediate attenuation lesion in segment 4 of the liver measures 2.9 cm (2/55), previously 1.9 cm on 08/27/2014 and 11 mm on 10/24/2013. 2.0 cm low-attenuation lesion in the dome of the liver (2/48), stable. Stone is seen in the gallbladder. No biliary ductal dilatation. Pancreas: Negative. Spleen: Negative. Adrenals/Urinary Tract: Right adrenal gland is unremarkable. Left adrenal mass  measures 2.5 x 2.9 cm and is fluid in density, stable. Kidneys are unremarkable. Ureters are decompressed. Bladder is somewhat low in volume. Stomach/Bowel: Stomach and proximal small bowel are unremarkable. Subtotal colectomy. Rectosigmoid colon is unremarkable. Vascular/Lymphatic: Atherosclerotic calcification of the arterial vasculature with an infrarenal aortic aneurysm repair. Infrarenal aorta measures up to 3.0 cm, stable. Retroperitoneal lymph nodes measure up to 10 mm in the left periaortic station, previously 7 mm. Reproductive: Hysterectomy.  Ovaries are atrophic. Other: Scattered surgical clips in the  abdomen. Mesenteries and peritoneum are otherwise unremarkable. No free fluid. Musculoskeletal: No worrisome lytic or sclerotic lesions. Degenerative changes are seen in the spine. IMPRESSION: 1. Enlarging intermediate density segment 4 liver lesion. Metastatic disease cannot be excluded. Consider further evaluation with repeat MR abdomen (preferably without and with contrast), as clinically indicated. 2. Posttreatment changes in the left hemi thorax. Associated small left pleural effusion, slightly decreased. 3. Enlarged pulmonary arteries, indicative of pulmonary arterial hypertension. 4. Coronary artery calcification. 5. Cholelithiasis. 6. Left adrenal adenoma. Electronically Signed   By: Lorin Picket M.D.   On: 10/04/2015 13:57   Ct Chest Wo Contrast  10/04/2015  CLINICAL DATA:  Lung cancer.  Chemotherapy and radiation therapy. EXAM: CT CHEST, ABDOMEN AND PELVIS WITHOUT CONTRAST TECHNIQUE: Multidetector CT imaging of the chest, abdomen and pelvis was performed following the standard protocol without IV contrast. COMPARISON:  CT chest 04/13/2015 and CT abdomen pelvis 08/27/2014. FINDINGS: CT CHEST FINDINGS Mediastinum/Lymph Nodes: Right IJ Port-A-Cath terminates in the SVC. No pathologically enlarged mediastinal or axillary lymph nodes. Hilar regions are difficult to definitively evaluate without IV  contrast. Pulmonary arteries are enlarged. Coronary artery calcification. Heart is at the upper limits of normal in size to mildly enlarged. Small amount of pericardial fluid may be physiologic. Pre pericardiac lymph nodes are sub cm in short axis size. Lungs/Pleura: Minimal biapical pleural parenchymal scarring. Moderate centrilobular emphysema. Minimal paraseptal emphysema. Post treatment collapse/ consolidation bronchiectasis in the medial aspect of the left hemi thorax. Small left pleural effusion, slightly decreased. No right pleural fluid. There is narrowing of the left lower lobe bronchi. Airway is otherwise unremarkable. Musculoskeletal: No worrisome lytic or sclerotic lesions. CT ABDOMEN PELVIS FINDINGS Hepatobiliary: Intermediate attenuation lesion in segment 4 of the liver measures 2.9 cm (2/55), previously 1.9 cm on 08/27/2014 and 11 mm on 10/24/2013. 2.0 cm low-attenuation lesion in the dome of the liver (2/48), stable. Stone is seen in the gallbladder. No biliary ductal dilatation. Pancreas: Negative. Spleen: Negative. Adrenals/Urinary Tract: Right adrenal gland is unremarkable. Left adrenal mass measures 2.5 x 2.9 cm and is fluid in density, stable. Kidneys are unremarkable. Ureters are decompressed. Bladder is somewhat low in volume. Stomach/Bowel: Stomach and proximal small bowel are unremarkable. Subtotal colectomy. Rectosigmoid colon is unremarkable. Vascular/Lymphatic: Atherosclerotic calcification of the arterial vasculature with an infrarenal aortic aneurysm repair. Infrarenal aorta measures up to 3.0 cm, stable. Retroperitoneal lymph nodes measure up to 10 mm in the left periaortic station, previously 7 mm. Reproductive: Hysterectomy.  Ovaries are atrophic. Other: Scattered surgical clips in the abdomen. Mesenteries and peritoneum are otherwise unremarkable. No free fluid. Musculoskeletal: No worrisome lytic or sclerotic lesions. Degenerative changes are seen in the spine. IMPRESSION: 1.  Enlarging intermediate density segment 4 liver lesion. Metastatic disease cannot be excluded. Consider further evaluation with repeat MR abdomen (preferably without and with contrast), as clinically indicated. 2. Posttreatment changes in the left hemi thorax. Associated small left pleural effusion, slightly decreased. 3. Enlarged pulmonary arteries, indicative of pulmonary arterial hypertension. 4. Coronary artery calcification. 5. Cholelithiasis. 6. Left adrenal adenoma. Electronically Signed   By: Lorin Picket M.D.   On: 10/04/2015 13:57   US Abdomen Complete  10/06/2015  CLINICAL DATA:  Lung cancer. COPD. Findings suggestive of acute cholecystitis on CT study performed 1 day prior. EXAM: ABDOMEN ULTRASOUND COMPLETE COMPARISON:  10/05/2015 CT abdomen/ pelvis. 08/20/2006 abdominal sonogram. FINDINGS: Gallbladder: Nonmobile 1.4 cm calcified shadowing gallstone in the gallbladder neck. Mildly distended gallbladder. Mild to moderate diffuse gallbladder wall thickening. Trace pericholecystic fluid.  No sonographic Murphy sign. Common bile duct: Diameter: 5 mm Liver: Hypodense 2.2 x 2.1 x 2.2 cm liver mass in the anterior left liver lobe. No additional liver masses are detected. Background liver parenchymal echogenicity and echotexture are within normal limits. IVC: No abnormality visualized. Pancreas: Visualized portion unremarkable. Spleen: Size and appearance within normal limits. Right Kidney: Length: 9.8 cm. Mildly echogenic right kidney. No right hydronephrosis. No right renal mass. Left Kidney: Length: 10.2 cm. Mildly echogenic left kidney. No left hydronephrosis. No left renal mass. Abdominal aorta: No aneurysm visualized. Other findings: None. IMPRESSION: 1. Nonmobile gallstone in the gallbladder neck with mild gallbladder distention, diffuse gallbladder wall thickening, trace pericholecystic fluid and no sonographic Murphy's sign. Sonographic findings are suspicious but not definitive for acute  cholecystitis given the absence of sonographic Murphy sign. Consider correlation with hepatobiliary scintigraphy as clinically warranted. 2. No biliary ductal dilatation. 3. Indeterminate hypodense 2.2 cm left liver lobe mass, liver metastasis not excluded. 4. No hydronephrosis. Echogenic kidneys, a nonspecific finding indicating renal parenchymal disease of uncertain chronicity. Electronically Signed   By: Ilona Sorrel M.D.   On: 10/06/2015 10:06   Nm Hepato W/eject Fract  10/06/2015  CLINICAL DATA:  81 year old female with nonmobile gallstone in the neck of the gallbladder with ultrasound findings suspicious for acute cholecystitis discovered during imaging for right abdominal pain. Initial encounter. EXAM: NUCLEAR MEDICINE HEPATOBILIARY IMAGING TECHNIQUE: Sequential images of the abdomen were obtained out to 60 minutes following intravenous administration of radiopharmaceutical. RADIOPHARMACEUTICALS:  5.1 mCi Tc-64m Choletec IV COMPARISON:  Ultrasound 0647 hours today. CT Abdomen and Pelvis 10/05/2015 FINDINGS: Prompt radiotracer uptake by the liver and clearance of the blood pool. Prompt CBD and small bowel activity by 10 minutes. Over the first hour of the study, no gallbladder activity occurred. Subsequently, the study was augmented with morphine. 30 minutes of additional imaging occurred, with no gallbladder activity. IMPRESSION: Absent gallbladder activity despite augmentation of this study with morphine, further raising suspicion of Acute Cholecystitis. CBD is patent. Electronically Signed   By: HGenevie AnnM.D.   On: 10/06/2015 14:21    Labs:  CBC:  Recent Labs  04/05/15 1330 08/16/15 1340 10/05/15 2145 10/06/15 0812  WBC 5.3 4.4 7.7 6.5  HGB 10.5* 9.6* 10.1* 9.7*  HCT 31.9* 30.0* 30.8* 29.7*  PLT 305 259 332 299    COAGS:  Recent Labs  10/07/15 0854  INR 1.23  APTT 39*    BMP:  Recent Labs  09/02/15 0927 10/04/15 1105 10/05/15 2145 10/06/15 0812 10/07/15 0428  NA 142   --  139 140 139  K 3.9  --  3.6 3.6 3.9  CL 104  --  105 105 105  CO2 21  --  '24 24 22  '$ GLUCOSE 90  --  122* 99 80  BUN 17  --  '15 11 12  '$ CALCIUM 9.5  --  9.3 9.3 8.8*  CREATININE 1.75* 1.90* 1.83* 1.66* 1.67*  GFRNONAA 27*  --  25* 28* 28*  GFRAA 32*  --  29* 33* 33*    LIVER FUNCTION TESTS:  Recent Labs  09/02/15 0927 10/05/15 2145 10/06/15 0812 10/07/15 0428  BILITOT 0.4 0.4 0.8 0.8  AST '19 26 22 20  '$ ALT 7 10* 10* 10*  ALKPHOS 42 36* 34* 25*  PROT 6.2 7.1 6.0* 5.4*  ALBUMIN 4.0 4.3 3.4* 3.1*    TUMOR MARKERS: No results for input(s): AFPTM, CEA, CA199, CHROMGRNA in the last 8760 hours.  Assessment and Plan:  Acute cholecystitis per imaging RUQ pain persistent Known Lung Ca and probable mets to liver Pt poor candidate for surgery Scheduled for percutaneous chole drain in IR Risks and Benefits discussed with the patient including, but not limited to bleeding, infection, gallbladder perforation, bile leak, sepsis or even death. All of the patient's questions were answered, patient is agreeable to proceed. Consent signed and in chart.   Thank you for this interesting consult.  I greatly enjoyed meeting Romelia Bromell and look forward to participating in their care.  A copy of this report was sent to the requesting provider on this date.  Electronically Signed: Catalaya Garr A 10/07/2015, 11:30 AM   I spent a total of 40 Minutes    in face to face in clinical consultation, greater than 50% of which was counseling/coordinating care for perc chole drain

## 2015-10-07 NOTE — Progress Notes (Signed)
Pt requesting to know time of procedure. Nurse attempted to call.

## 2015-10-07 NOTE — Procedures (Signed)
CT guided placement of cholecystostomy tube.  No immediate complication.  Removed greater than 90 ml of yellow cloudy fluid.  Will send fluid for culture.  Minimal blood loss.

## 2015-10-07 NOTE — Progress Notes (Signed)
Subjective: No n/v. But had right sided pain last night. Didn't sleep well   Objective: Vital signs in last 24 hours: Temp:  [98.1 F (36.7 C)-99.6 F (37.6 C)] 98.1 F (36.7 C) (04/13 0527) Pulse Rate:  [76-83] 83 (04/13 0527) Resp:  [15-20] 16 (04/13 0527) BP: (128-137)/(58-71) 128/64 mmHg (04/13 0527) SpO2:  [100 %] 100 % (04/13 0527) Last BM Date: 10/05/15  Intake/Output from previous day: 04/12 0701 - 04/13 0700 In: 2211.3 [I.V.:2011.3; IV Piggyback:200] Out: 500 [Urine:500] Intake/Output this shift:    Asleep, easily awakens Soft, nd, min to no RUQ pain, no rebound/guarding  Lab Results:   Recent Labs  10/05/15 2145 10/06/15 0812  WBC 7.7 6.5  HGB 10.1* 9.7*  HCT 30.8* 29.7*  PLT 332 299   BMET  Recent Labs  10/06/15 0812 10/07/15 0428  NA 140 139  K 3.6 3.9  CL 105 105  CO2 24 22  GLUCOSE 99 80  BUN 11 12  CREATININE 1.66* 1.67*  CALCIUM 9.3 8.8*   PT/INR No results for input(s): LABPROT, INR in the last 72 hours. ABG No results for input(s): PHART, HCO3 in the last 72 hours.  Invalid input(s): PCO2, PO2  Studies/Results: Ct Abdomen Pelvis Wo Contrast  10/06/2015  ADDENDUM REPORT: 10/06/2015 02:13 ADDENDUM: Prior reports have been obtained. The left hepatic lobe liver lesion is enlarged from 08/27/2014, as reported on the 10/04/2015 CT. The right hepatic lobe dome lesion is stable. Nonemergent abdominal MRI without/with contrast would be helpful for further evaluation of the enlarging left hepatic lobe lesion, if clinically warranted. The left adrenal nodule is unchanged from 08/27/2014. Electronically Signed   By: Andreas Newport M.D.   On: 10/06/2015 02:13  10/06/2015  CLINICAL DATA:  Dull right-sided abdominal pain, onset this morning. Nausea and chills. EXAM: CT ABDOMEN AND PELVIS WITHOUT CONTRAST TECHNIQUE: Multidetector CT imaging of the abdomen and pelvis was performed following the standard protocol without IV contrast. COMPARISON:   10/04/2015 FINDINGS: The gallbladder is distended, and there is an 11 mm calculus in the gallbladder neck. There is mild stranding around the gallbladder and a suggestion of gallbladder mural thickening. The findings may represent acute cholecystitis. No extrahepatic bile duct dilatation. There is mild right hydronephrosis without visible ureteral calculus. Left collecting system is mildly fold but not frankly hydronephrotic. Otherwise unremarkable appearances of the kidneys. No urinary calculi are evident. 2.7 cm low-attenuation liver lesion in the left lobe anteriorly, as well as a 1.7 cm low-attenuation lesion in the right hepatic lobe superiorly. These are indeterminate. There are unremarkable unenhanced appearances of the spleen and pancreas. There is a 2.8 cm left adrenal nodule. Extensive prior bowel surgery. No evidence of bowel obstruction. No extraluminal air. Abdominal aortic graft, appearing intact on this unenhanced scan. No significant skeletal lesion. Unchanged consolidation in the left lung base medially. IMPRESSION: 1. New gallbladder distention, mural thickening and adjacent stranding. Cholelithiasis. This may represent acute cholecystitis. Recommend right upper quadrant sonography to evaluate. 2. Mild right hydronephrosis and mild fullness of the left renal collecting system. No ureteral calculi are evident. 3. Low-attenuation liver lesions, not characterized. Left adrenal nodule, not characterized. Prior studies not currently available to assess stability. Electronically Signed: By: Andreas Newport M.D. On: 10/06/2015 00:36   US Abdomen Complete  10/06/2015  CLINICAL DATA:  Lung cancer. COPD. Findings suggestive of acute cholecystitis on CT study performed 1 day prior. EXAM: ABDOMEN ULTRASOUND COMPLETE COMPARISON:  10/05/2015 CT abdomen/ pelvis. 08/20/2006 abdominal sonogram. FINDINGS: Gallbladder: Nonmobile 1.4  cm calcified shadowing gallstone in the gallbladder neck. Mildly distended  gallbladder. Mild to moderate diffuse gallbladder wall thickening. Trace pericholecystic fluid. No sonographic Murphy sign. Common bile duct: Diameter: 5 mm Liver: Hypodense 2.2 x 2.1 x 2.2 cm liver mass in the anterior left liver lobe. No additional liver masses are detected. Background liver parenchymal echogenicity and echotexture are within normal limits. IVC: No abnormality visualized. Pancreas: Visualized portion unremarkable. Spleen: Size and appearance within normal limits. Right Kidney: Length: 9.8 cm. Mildly echogenic right kidney. No right hydronephrosis. No right renal mass. Left Kidney: Length: 10.2 cm. Mildly echogenic left kidney. No left hydronephrosis. No left renal mass. Abdominal aorta: No aneurysm visualized. Other findings: None. IMPRESSION: 1. Nonmobile gallstone in the gallbladder neck with mild gallbladder distention, diffuse gallbladder wall thickening, trace pericholecystic fluid and no sonographic Murphy's sign. Sonographic findings are suspicious but not definitive for acute cholecystitis given the absence of sonographic Murphy sign. Consider correlation with hepatobiliary scintigraphy as clinically warranted. 2. No biliary ductal dilatation. 3. Indeterminate hypodense 2.2 cm left liver lobe mass, liver metastasis not excluded. 4. No hydronephrosis. Echogenic kidneys, a nonspecific finding indicating renal parenchymal disease of uncertain chronicity. Electronically Signed   By: Ilona Sorrel M.D.   On: 10/06/2015 10:06   Nm Hepato W/eject Fract  10/06/2015  CLINICAL DATA:  81 year old female with nonmobile gallstone in the neck of the gallbladder with ultrasound findings suspicious for acute cholecystitis discovered during imaging for right abdominal pain. Initial encounter. EXAM: NUCLEAR MEDICINE HEPATOBILIARY IMAGING TECHNIQUE: Sequential images of the abdomen were obtained out to 60 minutes following intravenous administration of radiopharmaceutical. RADIOPHARMACEUTICALS:  5.1 mCi  Tc-63m Choletec IV COMPARISON:  Ultrasound 0647 hours today. CT Abdomen and Pelvis 10/05/2015 FINDINGS: Prompt radiotracer uptake by the liver and clearance of the blood pool. Prompt CBD and small bowel activity by 10 minutes. Over the first hour of the study, no gallbladder activity occurred. Subsequently, the study was augmented with morphine. 30 minutes of additional imaging occurred, with no gallbladder activity. IMPRESSION: Absent gallbladder activity despite augmentation of this study with morphine, further raising suspicion of Acute Cholecystitis. CBD is patent. Electronically Signed   By: HGenevie AnnM.D.   On: 10/06/2015 14:21    Anti-infectives: Anti-infectives    Start     Dose/Rate Route Frequency Ordered Stop   10/06/15 0600  piperacillin-tazobactam (ZOSYN) IVPB 3.375 g     3.375 g 12.5 mL/hr over 240 Minutes Intravenous 3 times per day 10/06/15 0354     10/06/15 0100  piperacillin-tazobactam (ZOSYN) IVPB 3.375 g     3.375 g 100 mL/hr over 30 Minutes Intravenous  Once 10/06/15 0048 10/06/15 0309      Assessment/Plan: Adv sq cell lung cancer Acute cholecystitis  Talked with pt and daughter on phone and presented options.  Options are surgical cholecystectomy versus percutaneous cholecystostomy tube. We discussed the pros and cons of each. We discussed the typical course with a cholecystostomy tube such as leaving the tube in for 6 weeks, followed by a drain study to see if the gallbladder is draining. If the gallbladder is draining then we will remove the tube. We did discuss the possibility of recurrent cholecystitis. If there is still ongoing obstruction of the cystic duct in 6 weeks then we will have to have a much larger discussion with her medical oncologist, and other physicians weigh in  Right now I recommended cholecystostomy tube drainage given her underlying advanced lung cancer diagnosis with probable metastatic disease to the liver.  The daughter is in agreement with  proceeding with cholecystostomy tube placement.  Leighton Ruff. Redmond Pulling, MD, FACS General, Bariatric, & Minimally Invasive Surgery [facility], [place]   LOS: 1 day    Gayland Curry 10/07/2015

## 2015-10-07 NOTE — Progress Notes (Signed)
[facility] PROGRESS NOTE  [name] OBS:[id number] DOB: 30-Oct-1934 DOA: 10/05/2015 PCP: Mickie Hillier, MD  HPI/Brief narrative [id number] who presented with complaints of right upper quadrant abdominal pain which has been going on for past few days prior to admission. Patient found to have gallbladder distention, mural thickening and adjacent stranding with cholelithiasis.  Assessment/Plan: Acute cholecystitis General surgery consulted and following Recs for chole drain per IR Continued on Zosyn for now HIDA scan done per surgery recs with findings c/w acute cholecystitis.  Hypertension Continued amlodipine, Vasotec  Squamous cell carcinoma of lung Patient has squamous cell carcinoma of the lung, with questionable liver lesions Patient followed by oncology as outpatient  Dementia, without behavior disturbance Continue Aricept, Seroquel  Code status: Full code  Family Communication: Pt in room, family at bedside Disposition Plan: Possible d/c home in 48-72hrs   Consultants:  General Surgery  IR  Procedures:    Antibiotics: Anti-infectives    Start     Dose/Rate Route Frequency Ordered Stop   10/06/15 0600  piperacillin-tazobactam (ZOSYN) IVPB 3.375 g     3.375 g 12.5 mL/hr over 240 Minutes Intravenous 3 times per day 10/06/15 0354     10/06/15 0100  piperacillin-tazobactam (ZOSYN) IVPB 3.375 g     3.375 g 100 mL/hr over 30 Minutes Intravenous  Once 10/06/15 0048 10/06/15 0309      HPI/Subjective: Without complaints. Denies abd pain  Objective: Filed Vitals:   10/06/15 2256 10/07/15 0527 10/07/15 0858 10/07/15 1345  BP: 133/59 128/64 116/56 131/63  Pulse: 81 83 80 85  Temp: 99.6 F (37.6 C) 98.1 F (36.7 C)  99.1 F (37.3 C)  TempSrc: Oral Oral  Oral  Resp: '15 16  16  '$ Height:      Weight:      SpO2: 100% 100%  98%    Intake/Output Summary (Last 24 hours) at 10/07/15 1600 Last data filed at 10/07/15 0817  Gross per 24 hour  Intake 2211.25 ml   Output      0 ml  Net 2211.25 ml   Filed Weights   10/05/15 2051 10/06/15 0400  Weight: 66.225 kg (146 lb) 65.363 kg (144 lb 1.6 oz)    Exam:   General:  Awake, in nad, laying in bed  Cardiovascular: regular, s1, s2  Respiratory: normal resp effort, no wheezing  Abdomen: soft, nondistended, pos BS  Musculoskeletal: perfused, no clubbing, no cyanosis  Data Reviewed: Basic Metabolic Panel:  Recent Labs Lab 10/04/15 1105 10/05/15 2145 10/06/15 0812 10/07/15 0428  NA  --  139 140 139  K  --  3.6 3.6 3.9  CL  --  105 105 105  CO2  --  '24 24 22  '$ GLUCOSE  --  122* 99 80  BUN  --  '15 11 12  '$ CREATININE 1.90* 1.83* 1.66* 1.67*  CALCIUM  --  9.3 9.3 8.8*   Liver Function Tests:  Recent Labs Lab 10/05/15 2145 10/06/15 0812 10/07/15 0428  AST '26 22 20  '$ ALT 10* 10* 10*  ALKPHOS 36* 34* 25*  BILITOT 0.4 0.8 0.8  PROT 7.1 6.0* 5.4*  ALBUMIN 4.3 3.4* 3.1*    Recent Labs Lab 10/05/15 2145  LIPASE 32   No results for input(s): AMMONIA in the last 168 hours. CBC:  Recent Labs Lab 10/05/15 2145 10/06/15 0812  WBC 7.7 6.5  NEUTROABS 6.2  --   HGB 10.1* 9.7*  HCT 30.8* 29.7*  MCV 103.0* 102.8*  PLT 332 299   Cardiac Enzymes:  No results for input(s): CKTOTAL, CKMB, CKMBINDEX, TROPONINI in the last 168 hours. BNP (last 3 results)  Recent Labs  11/15/14 2043  BNP 280.0*    ProBNP (last 3 results) No results for input(s): PROBNP in the last 8760 hours.  CBG: No results for input(s): GLUCAP in the last 168 hours.  No results found for this or any previous visit (from the past 240 hour(s)).   Studies: Ct Abdomen Pelvis Wo Contrast  10/06/2015  ADDENDUM REPORT: 10/06/2015 02:13 ADDENDUM: Prior reports have been obtained. The left hepatic lobe liver lesion is enlarged from 08/27/2014, as reported on the 10/04/2015 CT. The right hepatic lobe dome lesion is stable. Nonemergent abdominal MRI without/with contrast would be helpful for further evaluation of  the enlarging left hepatic lobe lesion, if clinically warranted. The left adrenal nodule is unchanged from 08/27/2014. Electronically Signed   By: Andreas Newport M.D.   On: 10/06/2015 02:13  10/06/2015  CLINICAL DATA:  Dull right-sided abdominal pain, onset this morning. Nausea and chills. EXAM: CT ABDOMEN AND PELVIS WITHOUT CONTRAST TECHNIQUE: Multidetector CT imaging of the abdomen and pelvis was performed following the standard protocol without IV contrast. COMPARISON:  10/04/2015 FINDINGS: The gallbladder is distended, and there is an 11 mm calculus in the gallbladder neck. There is mild stranding around the gallbladder and a suggestion of gallbladder mural thickening. The findings may represent acute cholecystitis. No extrahepatic bile duct dilatation. There is mild right hydronephrosis without visible ureteral calculus. Left collecting system is mildly fold but not frankly hydronephrotic. Otherwise unremarkable appearances of the kidneys. No urinary calculi are evident. 2.7 cm low-attenuation liver lesion in the left lobe anteriorly, as well as a 1.7 cm low-attenuation lesion in the right hepatic lobe superiorly. These are indeterminate. There are unremarkable unenhanced appearances of the spleen and pancreas. There is a 2.8 cm left adrenal nodule. Extensive prior bowel surgery. No evidence of bowel obstruction. No extraluminal air. Abdominal aortic graft, appearing intact on this unenhanced scan. No significant skeletal lesion. Unchanged consolidation in the left lung base medially. IMPRESSION: 1. New gallbladder distention, mural thickening and adjacent stranding. Cholelithiasis. This may represent acute cholecystitis. Recommend right upper quadrant sonography to evaluate. 2. Mild right hydronephrosis and mild fullness of the left renal collecting system. No ureteral calculi are evident. 3. Low-attenuation liver lesions, not characterized. Left adrenal nodule, not characterized. Prior studies not  currently available to assess stability. Electronically Signed: By: Andreas Newport M.D. On: 10/06/2015 00:36   US Abdomen Complete  10/06/2015  CLINICAL DATA:  Lung cancer. COPD. Findings suggestive of acute cholecystitis on CT study performed 1 day prior. EXAM: ABDOMEN ULTRASOUND COMPLETE COMPARISON:  10/05/2015 CT abdomen/ pelvis. 08/20/2006 abdominal sonogram. FINDINGS: Gallbladder: Nonmobile 1.4 cm calcified shadowing gallstone in the gallbladder neck. Mildly distended gallbladder. Mild to moderate diffuse gallbladder wall thickening. Trace pericholecystic fluid. No sonographic Murphy sign. Common bile duct: Diameter: 5 mm Liver: Hypodense 2.2 x 2.1 x 2.2 cm liver mass in the anterior left liver lobe. No additional liver masses are detected. Background liver parenchymal echogenicity and echotexture are within normal limits. IVC: No abnormality visualized. Pancreas: Visualized portion unremarkable. Spleen: Size and appearance within normal limits. Right Kidney: Length: 9.8 cm. Mildly echogenic right kidney. No right hydronephrosis. No right renal mass. Left Kidney: Length: 10.2 cm. Mildly echogenic left kidney. No left hydronephrosis. No left renal mass. Abdominal aorta: No aneurysm visualized. Other findings: None. IMPRESSION: 1. Nonmobile gallstone in the gallbladder neck with mild gallbladder distention, diffuse gallbladder wall thickening,  trace pericholecystic fluid and no sonographic Murphy's sign. Sonographic findings are suspicious but not definitive for acute cholecystitis given the absence of sonographic Murphy sign. Consider correlation with hepatobiliary scintigraphy as clinically warranted. 2. No biliary ductal dilatation. 3. Indeterminate hypodense 2.2 cm left liver lobe mass, liver metastasis not excluded. 4. No hydronephrosis. Echogenic kidneys, a nonspecific finding indicating renal parenchymal disease of uncertain chronicity. Electronically Signed   By: Ilona Sorrel M.D.   On: 10/06/2015  10:06   Nm Hepato W/eject Fract  10/06/2015  CLINICAL DATA:  81 year old female with nonmobile gallstone in the neck of the gallbladder with ultrasound findings suspicious for acute cholecystitis discovered during imaging for right abdominal pain. Initial encounter. EXAM: NUCLEAR MEDICINE HEPATOBILIARY IMAGING TECHNIQUE: Sequential images of the abdomen were obtained out to 60 minutes following intravenous administration of radiopharmaceutical. RADIOPHARMACEUTICALS:  5.1 mCi Tc-62m Choletec IV COMPARISON:  Ultrasound 0647 hours today. CT Abdomen and Pelvis 10/05/2015 FINDINGS: Prompt radiotracer uptake by the liver and clearance of the blood pool. Prompt CBD and small bowel activity by 10 minutes. Over the first hour of the study, no gallbladder activity occurred. Subsequently, the study was augmented with morphine. 30 minutes of additional imaging occurred, with no gallbladder activity. IMPRESSION: Absent gallbladder activity despite augmentation of this study with morphine, further raising suspicion of Acute Cholecystitis. CBD is patent. Electronically Signed   By: HGenevie AnnM.D.   On: 10/06/2015 14:21    Scheduled Meds: . allopurinol  300 mg Oral Daily  . ALPRAZolam  1 mg Oral TID  . amLODipine  5 mg Oral Daily  . aspirin EC  81 mg Oral Daily  . donepezil  5 mg Oral QHS  . DULoxetine  60 mg Oral Daily  . [START ON 10/08/2015] enoxaparin (LOVENOX) injection  30 mg Subcutaneous Q24H  . folic acid  1 mg Oral Daily  . hydrALAZINE  25 mg Oral TID  . piperacillin-tazobactam (ZOSYN)  IV  3.375 g Intravenous 3 times per day  . QUEtiapine  25 mg Oral QHS   Continuous Infusions: . sodium chloride 75 mL/hr at 10/07/15 1402    Active Problems:   Squamous cell carcinoma of left lung (HCC)   Moderate dementia without behavioral disturbance   [place] of [place]   Acute cholecystitis   [facility], [facility] Pager [phone] If 7PM-7AM, please contact  night-coverage at [email or web address], password [email or web address]4/13/2017, 4:00 PM  LOS: 1 day

## 2015-10-08 DIAGNOSIS — E440 Moderate protein-calorie malnutrition: Secondary | ICD-10-CM | POA: Insufficient documentation

## 2015-10-08 MED ORDER — TRAMADOL HCL 50 MG PO TABS
50.0000 mg | ORAL_TABLET | Freq: Four times a day (QID) | ORAL | Status: DC | PRN
  Administered 2015-10-10: 50 mg via ORAL
  Filled 2015-10-08: qty 1

## 2015-10-08 MED ORDER — HYDROCODONE-ACETAMINOPHEN 5-325 MG PO TABS
1.0000 | ORAL_TABLET | Freq: Two times a day (BID) | ORAL | Status: DC | PRN
  Administered 2015-10-08 – 2015-10-09 (×2): 1 via ORAL
  Filled 2015-10-08 (×2): qty 1

## 2015-10-08 NOTE — Progress Notes (Signed)
Subjective: Stable and alert.  Ambulating to bathroom with assistance Denies nausea.  Says she is hungry Right upper quadrant pain is mild.  No worse and no better.  Percutaneous cholecystostomy tube placed successfully yesterday.  Drainage is serosanguineous Remains on IV Zosyn.  Objective: Vital signs in last 24 hours: Temp:  [98.5 F (36.9 C)-99.3 F (37.4 C)] 99.3 F (37.4 C) (04/14 0525) Pulse Rate:  [75-92] 88 (04/14 0525) Resp:  [16-21] 18 (04/14 0525) BP: (116-149)/(41-78) 135/57 mmHg (04/14 0525) SpO2:  [98 %-100 %] 100 % (04/14 0525) Last BM Date: 10/07/15  Intake/Output from previous day: 04/13 0701 - 04/14 0700 In: 972.5 [I.V.:922.5; IV Piggyback:50] Out: 200 [Drains:200] Intake/Output this shift:    General appearance: Alert.  Pleasant.  Cooperative.  Poor historian regarding prior surgical events.  No distress. Resp: clear to auscultation bilaterally GI: Soft.  Not distended.  Minimal tenderness.  Right upper quadrant drain with serosanguineous fluid.  Long midline incision from prior operations.  No hernias  Lab Results:   Recent Labs  10/05/15 2145 10/06/15 0812  WBC 7.7 6.5  HGB 10.1* 9.7*  HCT 30.8* 29.7*  PLT 332 299   BMET  Recent Labs  10/06/15 0812 10/07/15 0428  NA 140 139  K 3.6 3.9  CL 105 105  CO2 24 22  GLUCOSE 99 80  BUN 11 12  CREATININE 1.66* 1.67*  CALCIUM 9.3 8.8*   PT/INR  Recent Labs  10/07/15 0854  LABPROT 15.7*  INR 1.23   ABG No results for input(s): PHART, HCO3 in the last 72 hours.  Invalid input(s): PCO2, PO2  Studies/Results: US Abdomen Complete  10/06/2015  CLINICAL DATA:  Lung cancer. COPD. Findings suggestive of acute cholecystitis on CT study performed 1 day prior. EXAM: ABDOMEN ULTRASOUND COMPLETE COMPARISON:  10/05/2015 CT abdomen/ pelvis. 08/20/2006 abdominal sonogram. FINDINGS: Gallbladder: Nonmobile 1.4 cm calcified shadowing gallstone in the gallbladder neck. Mildly distended gallbladder.  Mild to moderate diffuse gallbladder wall thickening. Trace pericholecystic fluid. No sonographic Murphy sign. Common bile duct: Diameter: 5 mm Liver: Hypodense 2.2 x 2.1 x 2.2 cm liver mass in the anterior left liver lobe. No additional liver masses are detected. Background liver parenchymal echogenicity and echotexture are within normal limits. IVC: No abnormality visualized. Pancreas: Visualized portion unremarkable. Spleen: Size and appearance within normal limits. Right Kidney: Length: 9.8 cm. Mildly echogenic right kidney. No right hydronephrosis. No right renal mass. Left Kidney: Length: 10.2 cm. Mildly echogenic left kidney. No left hydronephrosis. No left renal mass. Abdominal aorta: No aneurysm visualized. Other findings: None. IMPRESSION: 1. Nonmobile gallstone in the gallbladder neck with mild gallbladder distention, diffuse gallbladder wall thickening, trace pericholecystic fluid and no sonographic Murphy's sign. Sonographic findings are suspicious but not definitive for acute cholecystitis given the absence of sonographic Murphy sign. Consider correlation with hepatobiliary scintigraphy as clinically warranted. 2. No biliary ductal dilatation. 3. Indeterminate hypodense 2.2 cm left liver lobe mass, liver metastasis not excluded. 4. No hydronephrosis. Echogenic kidneys, a nonspecific finding indicating renal parenchymal disease of uncertain chronicity. Electronically Signed   By: Ilona Sorrel M.D.   On: 10/06/2015 10:06   Nm Hepato W/eject Fract  10/06/2015  CLINICAL DATA:  81 year old female with nonmobile gallstone in the neck of the gallbladder with ultrasound findings suspicious for acute cholecystitis discovered during imaging for right abdominal pain. Initial encounter. EXAM: NUCLEAR MEDICINE HEPATOBILIARY IMAGING TECHNIQUE: Sequential images of the abdomen were obtained out to 60 minutes following intravenous administration of radiopharmaceutical. RADIOPHARMACEUTICALS:  5.1 mCi Tc-21m  Choletec IV COMPARISON:  Ultrasound 0647 hours today. CT Abdomen and Pelvis 10/05/2015 FINDINGS: Prompt radiotracer uptake by the liver and clearance of the blood pool. Prompt CBD and small bowel activity by 10 minutes. Over the first hour of the study, no gallbladder activity occurred. Subsequently, the study was augmented with morphine. 30 minutes of additional imaging occurred, with no gallbladder activity. IMPRESSION: Absent gallbladder activity despite augmentation of this study with morphine, further raising suspicion of Acute Cholecystitis. CBD is patent. Electronically Signed   By: Genevie Marisabel M.D.   On: 10/06/2015 14:21   Ct Image Guided Drainage By Percutaneous Catheter  10/07/2015  INDICATION: 81 year old with acute cholecystitis. Patient is not a good surgical candidate and request for percutaneous cholecystostomy tube placement EXAM: CT GUIDED PLACEMENT OF CHOLECYSTOSTOMY TUBE MEDICATIONS: The patient is currently admitted to the hospital and receiving intravenous antibiotics. The antibiotics were administered within an appropriate time frame prior to the initiation of the procedure. ANESTHESIA/SEDATION: 0.5 mg IV Versed 50 mcg IV Fentanyl Moderate Sedation Time:  24 minutes The patient was continuously monitored during the procedure by the interventional radiology nurse under my direct supervision. COMPLICATIONS: None immediate. TECHNIQUE: Informed written consent was obtained from the patient after a thorough discussion of the procedural risks, benefits and alternatives. All questions were addressed. A timeout was performed prior to the initiation of the procedure. PROCEDURE: Patient was placed supine on the CT scanner. Images through the abdomen were obtained. The distended gallbladder was targeted. The right side of the abdomen was prepped with chlorhexidine and a sterile field was created. Skin was anesthetized with 1% lidocaine. Using CT guidance, an 18 gauge trocar needle was directed into the  distended gallbladder. Stiff Amplatz wire was advanced into the gallbladder. The tract was dilated to accommodate a 10 [place] drain. 10 French drain was advanced over the wire and reconstituted the gallbladder. Greater than 90 mL of cloudy yellow fluid was removed. Catheter was sutured to skin and attached to gravity bag. FINDINGS: Distended gallbladder with surrounding fluid. Drain placed within the gallbladder. Decompression of the gallbladder following drainage. Cloudy yellow fluid was removed and sent for culture. IMPRESSION: CT-guided percutaneous cholecystostomy tube placement. Electronically Signed   By: Markus Daft M.D.   On: 10/07/2015 19:39    Anti-infectives: Anti-infectives    Start     Dose/Rate Route Frequency Ordered Stop   10/06/15 0600  piperacillin-tazobactam (ZOSYN) IVPB 3.375 g     3.375 g 12.5 mL/hr over 240 Minutes Intravenous 3 times per day 10/06/15 0354     10/06/15 0100  piperacillin-tazobactam (ZOSYN) IVPB 3.375 g     3.375 g 100 mL/hr over 30 Minutes Intravenous  Once 10/06/15 0048 10/06/15 0309      Assessment/Plan:  Acute cholecystitis.  Stable following percutaneous cholecystostomy Advance diet as tolerated Continue IV Zosyn Check labs tomorrow PT consult to increase mobilization  Advanced squamous cell lung cancer Dementia, without behavior disturbance.  On Aricept and Seroquel Hypertension.  Continue amlodipine and Vasotec.   LOS: 2 days    [name] M 10/08/2015

## 2015-10-08 NOTE — Progress Notes (Signed)
CRITICAL VALUE ALERT  Critical value received:  Gram - cocci  Date of notification: 10/08/15  Time of notification:  0954  Critical value read back:Yes.    Nurse who received alert:  Fabian Sharp  MD notified (1st page):  Wyline Copas  Time of first page:  1035  MD notified (2nd page):  Time of second page:  Responding MD:  Wyline Copas  Time MD responded:  1040

## 2015-10-08 NOTE — Care Management Important Message (Signed)
Important Message  Patient Details  Name: [name] MRN: [id number] Date of Birth: 03/26/1935   Medicare Important Message Given:  Yes    Nathen May 10/08/2015, 11:32 AM

## 2015-10-08 NOTE — Progress Notes (Signed)
Referring Physician(s): Greer Pickerel  Supervising Physician: Corrie Mckusick  Chief Complaint:  Cholecystitis S/P Perc chole by Dr. Kathlene Cote 10/07/2015  Subjective:  [name] states she feels better today after the perc chole  She is tolerating liquids.  Allergies: Aleve; Dilaudid; Dyazide; and Zithromax  Medications: Prior to Admission medications   Medication Sig Start Date End Date Taking? Authorizing Provider  acetaminophen (TYLENOL) 500 MG tablet Take 500 mg by mouth every 6 (six) hours as needed for mild pain or moderate pain.   Yes Historical Provider, MD  allopurinol (ZYLOPRIM) 300 MG tablet take 1 tablet by mouth once daily 04/21/15  Yes Mikey Kirschner, MD  ALPRAZolam (XANAX) 1 MG tablet TAKE ONE-HALF TO ONE TABLET BY MOUTH THREE TIMES DAILY AS NEEDED FOR ANXIETY Patient taking differently: TAKE ONE TABLET BY MOUTH THREE TIMES DAILY 08/02/15  Yes Mikey Kirschner, MD  amLODipine (NORVASC) 5 MG tablet take 1 tablet by mouth once daily 06/29/15  Yes Kathyrn Drown, MD  aspirin EC 81 MG tablet Take 81 mg by mouth daily.   Yes Historical Provider, MD  donepezil (ARICEPT) 10 MG tablet Take 1 tablet daily 08/02/15  Yes Cameron Sprang, MD  DULoxetine (CYMBALTA) 60 MG capsule take 1 capsule by mouth once daily 08/30/15  Yes Mikey Kirschner, MD  enalapril (VASOTEC) 5 MG tablet Take 5 mg by mouth daily. 08/19/15  Yes Historical Provider, MD  fenofibrate 160 MG tablet take 1 tablet by mouth once daily with food 09/21/15  Yes Mikey Kirschner, MD  ferrous sulfate (FERROUSUL) 325 (65 FE) MG tablet Take 1 tablet (325 mg total) by mouth 2 (two) times daily after a meal. 03/19/13  Yes Rogene Houston, MD  folic acid (FOLVITE) 1 MG tablet Take 1 tablet (1 mg total) by mouth daily. 08/03/15  Yes Mikey Kirschner, MD  hydrALAZINE (APRESOLINE) 25 MG tablet take 1 tablet by mouth three times a day 04/26/15  Yes Mikey Kirschner, MD  HYDROcodone-acetaminophen (NORCO/VICODIN) 5-325 MG tablet Take 1  tablet twice a day as needed for pain Patient taking differently: Take 1 tablet by mouth 2 (two) times daily as needed for moderate pain or severe pain. Take 1 tablet twice a day as needed for pain 06/14/15  Yes Mikey Kirschner, MD  levothyroxine (SYNTHROID, LEVOTHROID) 137 MCG tablet take 1 tablet by mouth once daily 04/23/15  Yes Mikey Kirschner, MD  lidocaine-prilocaine (EMLA) cream Apply 1 application topically as needed (port).   Yes Historical Provider, MD  loperamide (IMODIUM) 2 MG capsule Take 2 mg by mouth 3 (three) times daily as needed for diarrhea or loose stools.    Yes Historical Provider, MD  meclizine (ANTIVERT) 25 MG tablet Take 1 tablet (25 mg total) by mouth 3 (three) times daily as needed for dizziness. 08/19/15  Yes Mikey Kirschner, MD  Menthol-Methyl Salicylate (MUSCLE RUB) 10-15 % CREA Apply 1 application topically as needed for muscle pain.   Yes Historical Provider, MD  ondansetron (ZOFRAN-ODT) 4 MG disintegrating tablet dissolve 1 tablet ON TONGUE every 8 hours if needed for nausea and vomiting 08/30/15  Yes Mikey Kirschner, MD  pantoprazole (PROTONIX) 40 MG tablet Take 1 tablet (40 mg total) by mouth daily. 07/22/15  Yes Mikey Kirschner, MD  potassium chloride (K-DUR) 10 MEQ tablet Take 2 tabs daily Patient taking differently: Take 20 mEq by mouth daily. Take 2 tabs daily 04/08/15  Yes Mikey Kirschner, MD  pravastatin (PRAVACHOL)  20 MG tablet take 1 tablet by mouth once daily 05/04/15  Yes Mikey Kirschner, MD  QUEtiapine (SEROQUEL) 25 MG tablet Take 1 tablet (25 mg total) by mouth at bedtime. 08/02/15  Yes Cameron Sprang, MD  SPIRIVA HANDIHALER 18 MCG inhalation capsule Place 18 mcg into inhaler and inhale as needed (for shortness of breath).  11/14/14  Yes Historical Provider, MD  traMADol (ULTRAM) 50 MG tablet take 1 tablet by mouth every 6 hours if needed Patient taking differently: Take 50 mg by mouth every 6 (six) hours as needed for moderate pain.  06/14/15  Yes Mikey Kirschner, MD  Vitamin D, Ergocalciferol, (DRISDOL) 50000 UNITS CAPS capsule Take 1 capsule (50,000 Units total) by mouth once a week. 06/14/15  Yes Mikey Kirschner, MD  amoxicillin (AMOXIL) 500 MG capsule Take 1 capsule (500 mg total) by mouth 3 (three) times daily. X 10 days Patient not taking: Reported on 10/05/2015 09/24/15   Mikey Kirschner, MD  cefPROZIL (CEFZIL) 500 MG tablet Take 1 tablet (500 mg total) by mouth 2 (two) times daily. Patient not taking: Reported on 10/05/2015 09/10/15   Mikey Kirschner, MD     Vital Signs: BP 135/57 mmHg  Pulse 88  Temp(Src) 99.3 F (37.4 C) (Oral)  Resp 18  Ht '5\' 7"'$  (1.702 m)  Wt 144 lb 1.6 oz (65.363 kg)  BMI 22.56 kg/m2  SpO2 100%  Physical Exam Awake and alert. Sitting up in bed Abdomen soft Drain in place, site ok. ~200 mls bilious drainage (no purulence seen)   Imaging: Ct Abdomen Pelvis Wo Contrast  10/06/2015  ADDENDUM REPORT: 10/06/2015 02:13 ADDENDUM: Prior reports have been obtained. The left hepatic lobe liver lesion is enlarged from 08/27/2014, as reported on the 10/04/2015 CT. The right hepatic lobe dome lesion is stable. Nonemergent abdominal MRI without/with contrast would be helpful for further evaluation of the enlarging left hepatic lobe lesion, if clinically warranted. The left adrenal nodule is unchanged from 08/27/2014. Electronically Signed   By: Andreas Newport M.D.   On: 10/06/2015 02:13  10/06/2015  CLINICAL DATA:  Dull right-sided abdominal pain, onset this morning. Nausea and chills. EXAM: CT ABDOMEN AND PELVIS WITHOUT CONTRAST TECHNIQUE: Multidetector CT imaging of the abdomen and pelvis was performed following the standard protocol without IV contrast. COMPARISON:  10/04/2015 FINDINGS: The gallbladder is distended, and there is an 11 mm calculus in the gallbladder neck. There is mild stranding around the gallbladder and a suggestion of gallbladder mural thickening. The findings may represent acute cholecystitis. No  extrahepatic bile duct dilatation. There is mild right hydronephrosis without visible ureteral calculus. Left collecting system is mildly fold but not frankly hydronephrotic. Otherwise unremarkable appearances of the kidneys. No urinary calculi are evident. 2.7 cm low-attenuation liver lesion in the left lobe anteriorly, as well as a 1.7 cm low-attenuation lesion in the right hepatic lobe superiorly. These are indeterminate. There are unremarkable unenhanced appearances of the spleen and pancreas. There is a 2.8 cm left adrenal nodule. Extensive prior bowel surgery. No evidence of bowel obstruction. No extraluminal air. Abdominal aortic graft, appearing intact on this unenhanced scan. No significant skeletal lesion. Unchanged consolidation in the left lung base medially. IMPRESSION: 1. New gallbladder distention, mural thickening and adjacent stranding. Cholelithiasis. This may represent acute cholecystitis. Recommend right upper quadrant sonography to evaluate. 2. Mild right hydronephrosis and mild fullness of the left renal collecting system. No ureteral calculi are evident. 3. Low-attenuation liver lesions, not characterized. Left adrenal nodule,  not characterized. Prior studies not currently available to assess stability. Electronically Signed: By: Andreas Newport M.D. On: 10/06/2015 00:36   US Abdomen Complete  10/06/2015  CLINICAL DATA:  Lung cancer. COPD. Findings suggestive of acute cholecystitis on CT study performed 1 day prior. EXAM: ABDOMEN ULTRASOUND COMPLETE COMPARISON:  10/05/2015 CT abdomen/ pelvis. 08/20/2006 abdominal sonogram. FINDINGS: Gallbladder: Nonmobile 1.4 cm calcified shadowing gallstone in the gallbladder neck. Mildly distended gallbladder. Mild to moderate diffuse gallbladder wall thickening. Trace pericholecystic fluid. No sonographic Murphy sign. Common bile duct: Diameter: 5 mm Liver: Hypodense 2.2 x 2.1 x 2.2 cm liver mass in the anterior left liver lobe. No additional liver  masses are detected. Background liver parenchymal echogenicity and echotexture are within normal limits. IVC: No abnormality visualized. Pancreas: Visualized portion unremarkable. Spleen: Size and appearance within normal limits. Right Kidney: Length: 9.8 cm. Mildly echogenic right kidney. No right hydronephrosis. No right renal mass. Left Kidney: Length: 10.2 cm. Mildly echogenic left kidney. No left hydronephrosis. No left renal mass. Abdominal aorta: No aneurysm visualized. Other findings: None. IMPRESSION: 1. Nonmobile gallstone in the gallbladder neck with mild gallbladder distention, diffuse gallbladder wall thickening, trace pericholecystic fluid and no sonographic Murphy's sign. Sonographic findings are suspicious but not definitive for acute cholecystitis given the absence of sonographic Murphy sign. Consider correlation with hepatobiliary scintigraphy as clinically warranted. 2. No biliary ductal dilatation. 3. Indeterminate hypodense 2.2 cm left liver lobe mass, liver metastasis not excluded. 4. No hydronephrosis. Echogenic kidneys, a nonspecific finding indicating renal parenchymal disease of uncertain chronicity. Electronically Signed   By: Ilona Sorrel M.D.   On: 10/06/2015 10:06   Nm Hepato W/eject Fract  10/06/2015  CLINICAL DATA:  81 year old female with nonmobile gallstone in the neck of the gallbladder with ultrasound findings suspicious for acute cholecystitis discovered during imaging for right abdominal pain. Initial encounter. EXAM: NUCLEAR MEDICINE HEPATOBILIARY IMAGING TECHNIQUE: Sequential images of the abdomen were obtained out to 60 minutes following intravenous administration of radiopharmaceutical. RADIOPHARMACEUTICALS:  5.1 mCi Tc-39m Choletec IV COMPARISON:  Ultrasound 0647 hours today. CT Abdomen and Pelvis 10/05/2015 FINDINGS: Prompt radiotracer uptake by the liver and clearance of the blood pool. Prompt CBD and small bowel activity by 10 minutes. Over the first hour of the  study, no gallbladder activity occurred. Subsequently, the study was augmented with morphine. 30 minutes of additional imaging occurred, with no gallbladder activity. IMPRESSION: Absent gallbladder activity despite augmentation of this study with morphine, further raising suspicion of Acute Cholecystitis. CBD is patent. Electronically Signed   By: HGenevie AnnM.D.   On: 10/06/2015 14:21   Ct Image Guided Drainage By Percutaneous Catheter  10/07/2015  INDICATION: 81year old with acute cholecystitis. Patient is not a good surgical candidate and request for percutaneous cholecystostomy tube placement EXAM: CT GUIDED PLACEMENT OF CHOLECYSTOSTOMY TUBE MEDICATIONS: The patient is currently admitted to the hospital and receiving intravenous antibiotics. The antibiotics were administered within an appropriate time frame prior to the initiation of the procedure. ANESTHESIA/SEDATION: 0.5 mg IV Versed 50 mcg IV Fentanyl Moderate Sedation Time:  24 minutes The patient was continuously monitored during the procedure by the interventional radiology nurse under my direct supervision. COMPLICATIONS: None immediate. TECHNIQUE: Informed written consent was obtained from the patient after a thorough discussion of the procedural risks, benefits and alternatives. All questions were addressed. A timeout was performed prior to the initiation of the procedure. PROCEDURE: Patient was placed supine on the CT scanner. Images through the abdomen were obtained. The distended gallbladder was targeted.  The right side of the abdomen was prepped with chlorhexidine and a sterile field was created. Skin was anesthetized with 1% lidocaine. Using CT guidance, an 18 gauge trocar needle was directed into the distended gallbladder. Stiff Amplatz wire was advanced into the gallbladder. The tract was dilated to accommodate a 10 [place] drain. 10 French drain was advanced over the wire and reconstituted the gallbladder. Greater than 90 mL of cloudy yellow  fluid was removed. Catheter was sutured to skin and attached to gravity bag. FINDINGS: Distended gallbladder with surrounding fluid. Drain placed within the gallbladder. Decompression of the gallbladder following drainage. Cloudy yellow fluid was removed and sent for culture. IMPRESSION: CT-guided percutaneous cholecystostomy tube placement. Electronically Signed   By: Markus Daft M.D.   On: 10/07/2015 19:39    Labs:  CBC:  Recent Labs  04/05/15 1330 08/16/15 1340 10/05/15 2145 10/06/15 0812  WBC 5.3 4.4 7.7 6.5  HGB 10.5* 9.6* 10.1* 9.7*  HCT 31.9* 30.0* 30.8* 29.7*  PLT 305 259 332 299    COAGS:  Recent Labs  10/07/15 0854  INR 1.23  APTT 39*    BMP:  Recent Labs  09/02/15 0927 10/04/15 1105 10/05/15 2145 10/06/15 0812 10/07/15 0428  NA 142  --  139 140 139  K 3.9  --  3.6 3.6 3.9  CL 104  --  105 105 105  CO2 21  --  '24 24 22  '$ GLUCOSE 90  --  122* 99 80  BUN 17  --  '15 11 12  '$ CALCIUM 9.5  --  9.3 9.3 8.8*  CREATININE 1.75* 1.90* 1.83* 1.66* 1.67*  GFRNONAA 27*  --  25* 28* 28*  GFRAA 32*  --  29* 33* 33*    LIVER FUNCTION TESTS:  Recent Labs  09/02/15 0927 10/05/15 2145 10/06/15 0812 10/07/15 0428  BILITOT 0.4 0.4 0.8 0.8  AST '19 26 22 20  '$ ALT 7 10* 10* 10*  ALKPHOS 42 36* 34* 25*  PROT 6.2 7.1 6.0* 5.4*  ALBUMIN 4.0 4.3 3.4* 3.1*    Assessment and Plan:  Cholecystitis, s/p perc chole by Dr. Kathlene Cote 10/07/2015 Continue drain to gravity bag. Care per Gen Surg  Electronically Signed: Murrell Redden 10/08/2015, 2:36 PM   I spent a total of 15 Minutes at the the patient's bedside AND on the patient's hospital floor or unit, greater than 50% of which was counseling/coordinating care for f/u after perc chole.

## 2015-10-08 NOTE — Progress Notes (Signed)
[facility] PROGRESS NOTE  [name] TZG:[id number] DOB: Mar 06, 1935 DOA: 10/05/2015 PCP: Mickie Hillier, MD  HPI/Brief narrative [phone] who presented with complaints of right upper quadrant abdominal pain which has been going on for past few days prior to admission. Patient found to have gallbladder distention, mural thickening and adjacent stranding with cholelithiasis.  Assessment/Plan: Acute cholecystitis General surgery was consulted  Continued on Zosyn HIDA scan done per surgery recs with findings c/w acute cholecystitis. Patient underwent chole drain placement on 4/13 by IR per Surgery recs Per Surgery, plan to keep on IV abx and advance diet as tolerated  Hypertension Continued amlodipine, Vasotec  Squamous cell carcinoma of lung Patient has squamous cell carcinoma of the lung, with questionable liver lesions Patient followed by oncology as outpatient  Dementia, without behavior disturbance Continue Aricept, Seroquel  Code status: Full code  Family Communication: Pt in room, family at bedside Disposition Plan: Possible d/c home with home PT in 48-72hrs   Consultants:  General Surgery  IR  Procedures:    Antibiotics: Anti-infectives    Start     Dose/Rate Route Frequency Ordered Stop   10/06/15 0600  piperacillin-tazobactam (ZOSYN) IVPB 3.375 g     3.375 g 12.5 mL/hr over 240 Minutes Intravenous 3 times per day 10/06/15 0354     10/06/15 0100  piperacillin-tazobactam (ZOSYN) IVPB 3.375 g     3.375 g 100 mL/hr over 30 Minutes Intravenous  Once 10/06/15 0048 10/06/15 0309      HPI/Subjective: Tolerating diet. No complaints  Objective: Filed Vitals:   10/07/15 2056 10/07/15 2211 10/08/15 0525 10/08/15 1628  BP: 149/65 143/56 135/57 120/61  Pulse: 84 92 88 81  Temp:  99.1 F (37.3 C) 99.3 F (37.4 C) 97.8 F (36.6 C)  TempSrc:  Oral Oral Oral  Resp:  '20 18 16  '$ Height:      Weight:      SpO2: 100% 99% 100% 100%    Intake/Output Summary  (Last 24 hours) at 10/08/15 1842 Last data filed at 10/08/15 1549  Gross per 24 hour  Intake 2737.5 ml  Output    200 ml  Net 2537.5 ml   Filed Weights   10/05/15 2051 10/06/15 0400  Weight: 66.225 kg (146 lb) 65.363 kg (144 lb 1.6 oz)    Exam:   General:  Awake, in nad  Cardiovascular: regular, s1, s2  Respiratory: normal resp effort, no wheezing  Abdomen: soft, nondistended, pos BS  Musculoskeletal: perfused, no cyanosis  Data Reviewed: Basic Metabolic Panel:  Recent Labs Lab 10/04/15 1105 10/05/15 2145 10/06/15 0812 10/07/15 0428  NA  --  139 140 139  K  --  3.6 3.6 3.9  CL  --  105 105 105  CO2  --  '24 24 22  '$ GLUCOSE  --  122* 99 80  BUN  --  '15 11 12  '$ CREATININE 1.90* 1.83* 1.66* 1.67*  CALCIUM  --  9.3 9.3 8.8*   Liver Function Tests:  Recent Labs Lab 10/05/15 2145 10/06/15 0812 10/07/15 0428  AST '26 22 20  '$ ALT 10* 10* 10*  ALKPHOS 36* 34* 25*  BILITOT 0.4 0.8 0.8  PROT 7.1 6.0* 5.4*  ALBUMIN 4.3 3.4* 3.1*    Recent Labs Lab 10/05/15 2145  LIPASE 32   No results for input(s): AMMONIA in the last 168 hours. CBC:  Recent Labs Lab 10/05/15 2145 10/06/15 0812  WBC 7.7 6.5  NEUTROABS 6.2  --   HGB 10.1* 9.7*  HCT 30.8* 29.7*  MCV 103.0* 102.8*  PLT 332 299   Cardiac Enzymes: No results for input(s): CKTOTAL, CKMB, CKMBINDEX, TROPONINI in the last 168 hours. BNP (last 3 results)  Recent Labs  11/15/14 2043  BNP 280.0*    ProBNP (last 3 results) No results for input(s): PROBNP in the last 8760 hours.  CBG: No results for input(s): GLUCAP in the last 168 hours.  Recent Results (from the past 240 hour(s))  Culture, body fluid-bottle     Status: None (Preliminary result)   Collection Time: 10/07/15  6:58 PM  Result Value Ref Range Status   Specimen Description FLUID GALL BLADDER  Final   Special Requests BOTTLES DRAWN AEROBIC AND ANAEROBIC 10CC  Final   Gram Stain   Final    GRAM NEGATIVE RODS IN BOTH AEROBIC AND  ANAEROBIC BOTTLES CRITICAL RESULT CALLED TO, READ BACK BY AND VERIFIED WITH: J EDWARDS,RN AT 6010 10/08/15 BY L BENFIELD    Culture GRAM NEGATIVE RODS  Final   Report Status PENDING  Incomplete  Gram stain     Status: None   Collection Time: 10/07/15  6:58 PM  Result Value Ref Range Status   Specimen Description FLUID GALL BLADDER  Final   Special Requests NONE  Final   Gram Stain   Final    ABUNDANT WBC PRESENT, PREDOMINANTLY PMN NO ORGANISMS SEEN    Report Status 10/07/2015 FINAL  Final     Studies: Ct Image Guided Drainage By Percutaneous Catheter  10/07/2015  INDICATION: 81 year old with acute cholecystitis. Patient is not a good surgical candidate and request for percutaneous cholecystostomy tube placement EXAM: CT GUIDED PLACEMENT OF CHOLECYSTOSTOMY TUBE MEDICATIONS: The patient is currently admitted to the hospital and receiving intravenous antibiotics. The antibiotics were administered within an appropriate time frame prior to the initiation of the procedure. ANESTHESIA/SEDATION: 0.5 mg IV Versed 50 mcg IV Fentanyl Moderate Sedation Time:  24 minutes The patient was continuously monitored during the procedure by the interventional radiology nurse under my direct supervision. COMPLICATIONS: None immediate. TECHNIQUE: Informed written consent was obtained from the patient after a thorough discussion of the procedural risks, benefits and alternatives. All questions were addressed. A timeout was performed prior to the initiation of the procedure. PROCEDURE: Patient was placed supine on the CT scanner. Images through the abdomen were obtained. The distended gallbladder was targeted. The right side of the abdomen was prepped with chlorhexidine and a sterile field was created. Skin was anesthetized with 1% lidocaine. Using CT guidance, an 18 gauge trocar needle was directed into the distended gallbladder. Stiff Amplatz wire was advanced into the gallbladder. The tract was dilated to accommodate a  10 [place] drain. 10 French drain was advanced over the wire and reconstituted the gallbladder. Greater than 90 mL of cloudy yellow fluid was removed. Catheter was sutured to skin and attached to gravity bag. FINDINGS: Distended gallbladder with surrounding fluid. Drain placed within the gallbladder. Decompression of the gallbladder following drainage. Cloudy yellow fluid was removed and sent for culture. IMPRESSION: CT-guided percutaneous cholecystostomy tube placement. Electronically Signed   By: Markus Daft M.D.   On: 10/07/2015 19:39    Scheduled Meds: . allopurinol  300 mg Oral Daily  . ALPRAZolam  1 mg Oral TID  . amLODipine  5 mg Oral Daily  . aspirin EC  81 mg Oral Daily  . donepezil  5 mg Oral QHS  . DULoxetine  60 mg Oral Daily  . enoxaparin (LOVENOX) injection  30 mg Subcutaneous Q24H  . folic  acid  1 mg Oral Daily  . hydrALAZINE  25 mg Oral TID  . piperacillin-tazobactam (ZOSYN)  IV  3.375 g Intravenous 3 times per day  . QUEtiapine  25 mg Oral QHS   Continuous Infusions: . sodium chloride 75 mL/hr at 10/08/15 1158    Active Problems:   Squamous cell carcinoma of left lung (HCC)   Moderate dementia without behavioral disturbance   Malignant neoplasm of lower lobe of left lung ([place])   Acute cholecystitis   Malnutrition of moderate degree   [name], [facility] Pager [phone]. If 7PM-7AM, please contact night-coverage at [email or web address], password [facility] 10/08/2015, 6:42 PM  LOS: 2 days

## 2015-10-08 NOTE — Evaluation (Signed)
Physical Therapy Evaluation Patient Details Name: [name] MRN: [id number] DOB: 05/24/1935 Today's Date: 10/08/2015   History of Present Illness  81 year old female who has a past medical history of Hypertension; Hypothyroidism; Hyperlipidemia; Insomnia; GERD; Adrenal adenoma; Chronic diarrhea; Fatty liver; Arthritis; QT prolongation; Coronary artery disease; Impaired fasting glucose; Renal insufficiency; Tobacco use; Family history of heart disease; IBS; Peripheral arterial disease; Cancer; Squamous cell carcinoma of left lung (07/29/2013); HCAP; Acute respiratory failure; COPD; and Pleural effusio admitted with RUQ pain and diarrhea.  Found to have acute cholecystitis now s/p Percutaneous cholecystostomy.  Clinical Impression  Patient presents with decreased independence with mobility due to deficits listed in PT problem list.  She will benefit from skilled PT in the acute setting to allow return home with family support and to resume HHPT at d/c.    Follow Up Recommendations Home health PT (daughter reports already have HHPT set up)    Equipment Recommendations  None recommended by PT    Recommendations for Other Services       Precautions / Restrictions Precautions Precautions: Fall Precaution Comments: appropriately uses walker for decreased risk      Mobility  Bed Mobility               General bed mobility comments: up in chair  Transfers Overall transfer level: Needs assistance Equipment used: Rolling walker (2 wheeled) Transfers: Sit to/from Stand Sit to Stand: Supervision         General transfer comment: cues for hand placement  Ambulation/Gait Ambulation/Gait assistance: Supervision;Min guard Ambulation Distance (Feet): 300 Feet Assistive device: Rolling walker (2 wheeled) Gait Pattern/deviations: Step-through pattern;Decreased stride length;Trunk flexed     General Gait Details: assist for maneuvering around obstacles at times esp in small spaces  in room (due to planning deficits)  Stairs            Wheelchair Mobility    Modified Rankin (Stroke Patients Only)       Balance Overall balance assessment: Needs assistance   Sitting balance-Leahy Scale: Good     Standing balance support: No upper extremity supported Standing balance-Leahy Scale: Fair Standing balance comment: can balance without UE support, but relies on walker for safety                             Pertinent Vitals/Pain Pain Assessment: No/denies pain    Home Living Family/patient expects to be discharged to:: Private residence Living Arrangements: Children Available Help at Discharge: Family Type of Home: Apartment Home Access: Level entry     Home Layout: One level Home Equipment: Environmental consultant - 2 wheels;Shower seat      Prior Function Level of Independence: Needs assistance   Gait / Transfers Assistance Needed: ambulates independent with walker  ADL's / Homemaking Assistance Needed: assist for bath/shower, meals  Comments: has family there most of the time unless running errands     Hand Dominance        Extremity/Trunk Assessment               Lower Extremity Assessment: Generalized weakness         Communication   Communication: No difficulties  Cognition Arousal/Alertness: Awake/alert Behavior During Therapy: WFL for tasks assessed/performed Overall Cognitive Status: History of cognitive impairments - at baseline                      General Comments      Exercises  Assessment/Plan    PT Assessment Patient needs continued PT services  PT Diagnosis Abnormality of gait;Generalized weakness   PT Problem List Decreased strength;Decreased balance;Decreased mobility;Decreased knowledge of use of DME;Decreased safety awareness;Decreased knowledge of precautions  PT Treatment Interventions DME instruction;Balance training;Gait training;Patient/family education;Functional mobility  training;Stair training;Therapeutic activities;Therapeutic exercise   PT Goals (Current goals can be found in the Care Plan section) Acute Rehab PT Goals Patient Stated Goal: To go home PT Goal Formulation: With patient/family Time For Goal Achievement: 10/15/15 Potential to Achieve Goals: Good    Frequency Min 3X/week   Barriers to discharge        Co-evaluation               End of Session Equipment Utilized During Treatment: Oxygen Activity Tolerance: Patient tolerated treatment well Patient left: in chair;with family/visitor present;with call bell/phone within reach           Time: 1435-1459 PT Time Calculation (min) (ACUTE ONLY): 24 min   Charges:   PT Evaluation $PT Eval Moderate Complexity: 1 Procedure PT Treatments $Gait Training: 8-22 mins   PT G CodesReginia Naas October 18, 2015, 3:43 PM  Magda Kiel, [place] 10-18-2015

## 2015-10-09 DIAGNOSIS — C3432 Malignant neoplasm of lower lobe, left bronchus or lung: Secondary | ICD-10-CM

## 2015-10-09 LAB — CBC
HEMATOCRIT: 27.2 % — AB (ref 36.0–46.0)
Hemoglobin: 8.5 g/dL — ABNORMAL LOW (ref 12.0–15.0)
MCH: 31.5 pg (ref 26.0–34.0)
MCHC: 31.3 g/dL (ref 30.0–36.0)
MCV: 100.7 fL — AB (ref 78.0–100.0)
PLATELETS: 259 10*3/uL (ref 150–400)
RBC: 2.7 MIL/uL — AB (ref 3.87–5.11)
RDW: 13.9 % (ref 11.5–15.5)
WBC: 3.8 10*3/uL — ABNORMAL LOW (ref 4.0–10.5)

## 2015-10-09 LAB — COMPREHENSIVE METABOLIC PANEL
ALT: 8 U/L — ABNORMAL LOW (ref 14–54)
ANION GAP: 9 (ref 5–15)
AST: 19 U/L (ref 15–41)
Albumin: 2.8 g/dL — ABNORMAL LOW (ref 3.5–5.0)
Alkaline Phosphatase: 23 U/L — ABNORMAL LOW (ref 38–126)
BILIRUBIN TOTAL: 0.5 mg/dL (ref 0.3–1.2)
BUN: 12 mg/dL (ref 6–20)
CHLORIDE: 107 mmol/L (ref 101–111)
CO2: 24 mmol/L (ref 22–32)
Calcium: 9.2 mg/dL (ref 8.9–10.3)
Creatinine, Ser: 1.48 mg/dL — ABNORMAL HIGH (ref 0.44–1.00)
GFR, EST AFRICAN AMERICAN: 38 mL/min — AB (ref >60)
GFR, EST NON AFRICAN AMERICAN: 33 mL/min — AB (ref >60)
Glucose, Bld: 96 mg/dL (ref 65–99)
POTASSIUM: 3.3 mmol/L — AB (ref 3.5–5.1)
Sodium: 140 mmol/L (ref 135–145)
TOTAL PROTEIN: 5.4 g/dL — AB (ref 6.5–8.1)

## 2015-10-09 LAB — LIPASE, BLOOD: LIPASE: 29 U/L (ref 11–51)

## 2015-10-09 LAB — GLUCOSE, CAPILLARY: GLUCOSE-CAPILLARY: 139 mg/dL — AB (ref 65–99)

## 2015-10-09 MED ORDER — POTASSIUM CHLORIDE CRYS ER 20 MEQ PO TBCR
40.0000 meq | EXTENDED_RELEASE_TABLET | Freq: Once | ORAL | Status: AC
  Administered 2015-10-09: 40 meq via ORAL
  Filled 2015-10-09: qty 2

## 2015-10-09 MED ORDER — POLYETHYLENE GLYCOL 3350 17 G PO PACK
17.0000 g | PACK | Freq: Every day | ORAL | Status: DC
  Administered 2015-10-09 – 2015-10-10 (×2): 17 g via ORAL
  Filled 2015-10-09 (×3): qty 1

## 2015-10-09 MED ORDER — ONDANSETRON HCL 4 MG/2ML IJ SOLN
4.0000 mg | Freq: Four times a day (QID) | INTRAMUSCULAR | Status: DC | PRN
  Administered 2015-10-09: 4 mg via INTRAVENOUS
  Filled 2015-10-09: qty 2

## 2015-10-09 MED ORDER — DOCUSATE SODIUM 100 MG PO CAPS
100.0000 mg | ORAL_CAPSULE | Freq: Two times a day (BID) | ORAL | Status: DC
  Administered 2015-10-09 – 2015-10-10 (×4): 100 mg via ORAL
  Filled 2015-10-09 (×5): qty 1

## 2015-10-09 NOTE — Progress Notes (Signed)
Pharmacy Antibiotic Note  [name] is a 81 y.o. female admitted on 10/05/2015 with Abdominal pain.  Pharmacy has been consulted for Zosyn dosing.  Now Day #4 of Zosyn for intra-abdominal infxn. Afebrile, WBC wnl.  Body fluid cx shows GNRs. SCr trending down to 1.48, CrCl ~34m/min.  Plan: Continue Zosyn 3.375 gm IV q8h (4 hour infusion) Monitor clinical picture, renal function F/U C&S, abx deescalation / LOT  Could consider transitioning to PO once cx results with susceptibilities  Height: '5\' 7"'$  (170.2 cm) Weight: 144 lb 1.6 oz (65.363 kg) IBW/kg (Calculated) : 61.6  Temp (24hrs), Avg:98.5 F (36.9 C), Min:97.8 F (36.6 C), Max:99.2 F (37.3 C)   Recent Labs Lab 10/04/15 1105 10/05/15 2145 10/06/15 0812 10/07/15 0428 10/09/15 0522  WBC  --  7.7 6.5  --  3.8*  CREATININE 1.90* 1.83* 1.66* 1.67* 1.48*    Estimated Creatinine Clearance: 30.5 mL/min (by C-G formula based on Cr of 1.48).    Allergies  Allergen Reactions  . Aleve [Naproxen Sodium] Other (See Comments)    GI bleed  . Dilaudid [Hydromorphone Hcl]     Can not tolerate  . Dyazide [Hydrochlorothiazide W-Triamterene] Other (See Comments)    cramps  . Zithromax [Azithromycin] Itching   NElenor Quinones PharmD, BCPS Clinical Pharmacist Pager [phone]/15/2017 1:39 PM

## 2015-10-09 NOTE — Progress Notes (Signed)
Subjective: Stable and alert.  Afebrile States she is comfortable. Denies nausea Tolerating solid diet last night Minimal pain  Gallbladder bile cultures growing gram-negative rods Remains on Zosyn  Objective: Vital signs in last 24 hours: Temp:  [97.8 F (36.6 C)-99.2 F (37.3 C)] 98.4 F (36.9 C) (04/15 0457) Pulse Rate:  [77-81] 79 (04/15 0457) Resp:  [16-18] 18 (04/15 0457) BP: (120-139)/(60-67) 134/60 mmHg (04/15 0457) SpO2:  [98 %-100 %] 100 % (04/15 0457) Last BM Date: 10/08/15  Intake/Output from previous day: 04/14 0701 - 04/15 0700 In: 1915 [P.O.:440; I.V.:1275; IV Piggyback:200] Out: 140 [Drains:140] Intake/Output this shift: Total I/O In: 100 [IV Piggyback:100] Out: 80 [Drains:80]  General appearance: Alert.  Pleasant.  No distress.  Cooperative.  Poor historian. Resp: clear to auscultation bilaterally GI: Abdomen soft.  Nondistended.  Only tenderness is around drain site.  Drainage is thin transparent bilious.  Not purulent  Lab Results:   Recent Labs  10/06/15 0812  WBC 6.5  HGB 9.7*  HCT 29.7*  PLT 299   BMET  Recent Labs  10/06/15 0812 10/07/15 0428  NA 140 139  K 3.6 3.9  CL 105 105  CO2 24 22  GLUCOSE 99 80  BUN 11 12  CREATININE 1.66* 1.67*  CALCIUM 9.3 8.8*   PT/INR  Recent Labs  10/07/15 0854  LABPROT 15.7*  INR 1.23   ABG No results for input(s): PHART, HCO3 in the last 72 hours.  Invalid input(s): PCO2, PO2  Studies/Results: Ct Image Guided Drainage By Percutaneous Catheter  10/07/2015  INDICATION: 81 year old with acute cholecystitis. Patient is not a good surgical candidate and request for percutaneous cholecystostomy tube placement EXAM: CT GUIDED PLACEMENT OF CHOLECYSTOSTOMY TUBE MEDICATIONS: The patient is currently admitted to the hospital and receiving intravenous antibiotics. The antibiotics were administered within an appropriate time frame prior to the initiation of the procedure. ANESTHESIA/SEDATION: 0.5  mg IV Versed 50 mcg IV Fentanyl Moderate Sedation Time:  24 minutes The patient was continuously monitored during the procedure by the interventional radiology nurse under my direct supervision. COMPLICATIONS: None immediate. TECHNIQUE: Informed written consent was obtained from the patient after a thorough discussion of the procedural risks, benefits and alternatives. All questions were addressed. A timeout was performed prior to the initiation of the procedure. PROCEDURE: Patient was placed supine on the CT scanner. Images through the abdomen were obtained. The distended gallbladder was targeted. The right side of the abdomen was prepped with chlorhexidine and a sterile field was created. Skin was anesthetized with 1% lidocaine. Using CT guidance, an 18 gauge trocar needle was directed into the distended gallbladder. Stiff Amplatz wire was advanced into the gallbladder. The tract was dilated to accommodate a 10 [place] drain. 10 French drain was advanced over the wire and reconstituted the gallbladder. Greater than 90 mL of cloudy yellow fluid was removed. Catheter was sutured to skin and attached to gravity bag. FINDINGS: Distended gallbladder with surrounding fluid. Drain placed within the gallbladder. Decompression of the gallbladder following drainage. Cloudy yellow fluid was removed and sent for culture. IMPRESSION: CT-guided percutaneous cholecystostomy tube placement. Electronically Signed   By: Markus Daft M.D.   On: 10/07/2015 19:39    Anti-infectives: Anti-infectives    Start     Dose/Rate Route Frequency Ordered Stop   10/06/15 0600  piperacillin-tazobactam (ZOSYN) IVPB 3.375 g     3.375 g 12.5 mL/hr over 240 Minutes Intravenous 3 times per day 10/06/15 0354     10/06/15 0100  piperacillin-tazobactam (ZOSYN) IVPB  3.375 g     3.375 g 100 mL/hr over 30 Minutes Intravenous  Once 10/06/15 0048 10/06/15 0309      Assessment/Plan:   Acute cholecystitis. Stable following percutaneous  cholecystostomy 4/13 Bile not purulent but growing gram-negative rods Advance diet as tolerated Continue IV Zosyn Check labs today - pending PT consult to increase mobilization  Advanced squamous cell lung cancer Dementia, without behavior disturbance. On Aricept and Seroquel Hypertension. Continue amlodipine and Vasotec.   LOS: 3 days    Dontai Pember M 10/09/2015

## 2015-10-09 NOTE — Progress Notes (Signed)
[facility] PROGRESS NOTE  [name] JGG:[id number] DOB: September 02, 1934 DOA: 10/05/2015 PCP: Mickie Hillier, MD  HPI/Brief narrative [phone] who presented with complaints of right upper quadrant abdominal pain which has been going on for past few days prior to admission. Patient found to have gallbladder distention, mural thickening and adjacent stranding with cholelithiasis.  Assessment/Plan: Acute cholecystitis General surgery following Continue Zosyn per Surgery recs HIDA scan done per surgery recs with findings c/w acute cholecystitis. Patient underwent chole drain placement on 4/13 by IR per Surgery recs Per Surgery, plan to keep on IV abx for now Diet is advanced to regular diet  Hypertension Continued amlodipine, Vasotec  Squamous cell carcinoma of lung Patient has squamous cell carcinoma of the lung, with questionable liver lesions Patient followed by oncology as outpatient  Dementia, without behavior disturbance Continue Aricept, Seroquel  Code status: Full code  Constipation: Cathartics ordered. Should help with appetitie  Family Communication: Pt in room, family at bedside Disposition Plan: Possible d/c home with home PT in 48-72hrs   Consultants:  General Surgery  IR  Procedures:    Antibiotics: Anti-infectives    Start     Dose/Rate Route Frequency Ordered Stop   10/06/15 0600  piperacillin-tazobactam (ZOSYN) IVPB 3.375 g     3.375 g 12.5 mL/hr over 240 Minutes Intravenous 3 times per day 10/06/15 0354     10/06/15 0100  piperacillin-tazobactam (ZOSYN) IVPB 3.375 g     3.375 g 100 mL/hr over 30 Minutes Intravenous  Once 10/06/15 0048 10/06/15 0309      HPI/Subjective: Complains of abd fullness  Objective: Filed Vitals:   10/09/15 0457 10/09/15 0921 10/09/15 0924 10/09/15 1447  BP: 134/60 134/69 134/69 157/78  Pulse: 79  83 91  Temp: 98.4 F (36.9 C)   99.1 F (37.3 C)  TempSrc: Oral   Oral  Resp: 18   20  Height:      Weight:       SpO2: 100%   100%    Intake/Output Summary (Last 24 hours) at 10/09/15 1829 Last data filed at 10/09/15 1300  Gross per 24 hour  Intake   1360 ml  Output    140 ml  Net   1220 ml   Filed Weights   10/05/15 2051 10/06/15 0400  Weight: 66.225 kg (146 lb) 65.363 kg (144 lb 1.6 oz)    Exam:   General:  Awake, in nad, laying in bed  Cardiovascular: regular, s1, s2  Respiratory: normal resp effort, no wheezing  Abdomen: soft,  pos BS  Musculoskeletal: perfused, no cyanosis  Data Reviewed: Basic Metabolic Panel:  Recent Labs Lab 10/04/15 1105 10/05/15 2145 10/06/15 0812 10/07/15 0428 10/09/15 0522  NA  --  139 140 139 140  K  --  3.6 3.6 3.9 3.3*  CL  --  105 105 105 107  CO2  --  '24 24 22 24  '$ GLUCOSE  --  122* 99 80 96  BUN  --  '15 11 12 12  '$ CREATININE 1.90* 1.83* 1.66* 1.67* 1.48*  CALCIUM  --  9.3 9.3 8.8* 9.2   Liver Function Tests:  Recent Labs Lab 10/05/15 2145 10/06/15 0812 10/07/15 0428 10/09/15 0522  AST '26 22 20 19  '$ ALT 10* 10* 10* 8*  ALKPHOS 36* 34* 25* 23*  BILITOT 0.4 0.8 0.8 0.5  PROT 7.1 6.0* 5.4* 5.4*  ALBUMIN 4.3 3.4* 3.1* 2.8*    Recent Labs Lab 10/05/15 2145 10/09/15 0522  LIPASE 32 29   No  results for input(s): AMMONIA in the last 168 hours. CBC:  Recent Labs Lab 10/05/15 2145 10/06/15 0812 10/09/15 0522  WBC 7.7 6.5 3.8*  NEUTROABS 6.2  --   --   HGB 10.1* 9.7* 8.5*  HCT 30.8* 29.7* 27.2*  MCV 103.0* 102.8* 100.7*  PLT 332 299 259   Cardiac Enzymes: No results for input(s): CKTOTAL, CKMB, CKMBINDEX, TROPONINI in the last 168 hours. BNP (last 3 results)  Recent Labs  11/15/14 2043  BNP 280.0*    ProBNP (last 3 results) No results for input(s): PROBNP in the last 8760 hours.  CBG:  Recent Labs Lab 10/09/15 1159  GLUCAP 139*    Recent Results (from the past 240 hour(s))  Culture, body fluid-bottle     Status: None (Preliminary result)   Collection Time: 10/07/15  6:58 PM  Result Value Ref Range  Status   Specimen Description FLUID GALL BLADDER  Final   Special Requests BOTTLES DRAWN AEROBIC AND ANAEROBIC 10CC  Final   Gram Stain   Final    GRAM NEGATIVE RODS IN BOTH AEROBIC AND ANAEROBIC BOTTLES CRITICAL RESULT CALLED TO, READ BACK BY AND VERIFIED WITH: J EDWARDS,RN AT 7412 10/08/15 BY L BENFIELD    Culture GRAM NEGATIVE RODS  Final   Report Status PENDING  Incomplete  Gram stain     Status: None   Collection Time: 10/07/15  6:58 PM  Result Value Ref Range Status   Specimen Description FLUID GALL BLADDER  Final   Special Requests NONE  Final   Gram Stain   Final    ABUNDANT WBC PRESENT, PREDOMINANTLY PMN NO ORGANISMS SEEN    Report Status 10/07/2015 FINAL  Final     Studies: Ct Image Guided Drainage By Percutaneous Catheter  10/07/2015  INDICATION: 81 year old with acute cholecystitis. Patient is not a good surgical candidate and request for percutaneous cholecystostomy tube placement EXAM: CT GUIDED PLACEMENT OF CHOLECYSTOSTOMY TUBE MEDICATIONS: The patient is currently admitted to the hospital and receiving intravenous antibiotics. The antibiotics were administered within an appropriate time frame prior to the initiation of the procedure. ANESTHESIA/SEDATION: 0.5 mg IV Versed 50 mcg IV Fentanyl Moderate Sedation Time:  24 minutes The patient was continuously monitored during the procedure by the interventional radiology nurse under my direct supervision. COMPLICATIONS: None immediate. TECHNIQUE: Informed written consent was obtained from the patient after a thorough discussion of the procedural risks, benefits and alternatives. All questions were addressed. A timeout was performed prior to the initiation of the procedure. PROCEDURE: Patient was placed supine on the CT scanner. Images through the abdomen were obtained. The distended gallbladder was targeted. The right side of the abdomen was prepped with chlorhexidine and a sterile field was created. Skin was anesthetized with 1%  lidocaine. Using CT guidance, an 18 gauge trocar needle was directed into the distended gallbladder. Stiff Amplatz wire was advanced into the gallbladder. The tract was dilated to accommodate a 10 [place] drain. 10 French drain was advanced over the wire and reconstituted the gallbladder. Greater than 90 mL of cloudy yellow fluid was removed. Catheter was sutured to skin and attached to gravity bag. FINDINGS: Distended gallbladder with surrounding fluid. Drain placed within the gallbladder. Decompression of the gallbladder following drainage. Cloudy yellow fluid was removed and sent for culture. IMPRESSION: CT-guided percutaneous cholecystostomy tube placement. Electronically Signed   By: Markus Daft M.D.   On: 10/07/2015 19:39    Scheduled Meds: . allopurinol  300 mg Oral Daily  . ALPRAZolam  1 mg  Oral TID  . amLODipine  5 mg Oral Daily  . aspirin EC  81 mg Oral Daily  . docusate sodium  100 mg Oral BID  . donepezil  5 mg Oral QHS  . DULoxetine  60 mg Oral Daily  . enoxaparin (LOVENOX) injection  30 mg Subcutaneous Q24H  . folic acid  1 mg Oral Daily  . hydrALAZINE  25 mg Oral TID  . piperacillin-tazobactam (ZOSYN)  IV  3.375 g Intravenous 3 times per day  . polyethylene glycol  17 g Oral Daily  . QUEtiapine  25 mg Oral QHS   Continuous Infusions: . sodium chloride 75 mL/hr at 10/09/15 0761    Active Problems:   Squamous cell carcinoma of left lung (HCC)   Moderate dementia without behavioral disturbance   Malignant neoplasm of lower lobe of left lung ([place])   Acute cholecystitis   Malnutrition of moderate degree   [name], [place] [facility] Pager [phone]. If 7PM-7AM, please contact night-coverage at [email or web address], password [facility] 10/09/2015, 6:29 PM  LOS: 3 days

## 2015-10-10 LAB — CULTURE, BODY FLUID W GRAM STAIN -BOTTLE

## 2015-10-10 LAB — COMPREHENSIVE METABOLIC PANEL
ALBUMIN: 2.8 g/dL — AB (ref 3.5–5.0)
ALK PHOS: 24 U/L — AB (ref 38–126)
ALT: 9 U/L — ABNORMAL LOW (ref 14–54)
ANION GAP: 9 (ref 5–15)
AST: 21 U/L (ref 15–41)
BILIRUBIN TOTAL: 0.6 mg/dL (ref 0.3–1.2)
BUN: 9 mg/dL (ref 6–20)
CALCIUM: 9.3 mg/dL (ref 8.9–10.3)
CO2: 23 mmol/L (ref 22–32)
CREATININE: 1.58 mg/dL — AB (ref 0.44–1.00)
Chloride: 109 mmol/L (ref 101–111)
GFR calc Af Amer: 35 mL/min — ABNORMAL LOW (ref >60)
GFR calc non Af Amer: 30 mL/min — ABNORMAL LOW (ref >60)
Glucose, Bld: 114 mg/dL — ABNORMAL HIGH (ref 65–99)
Potassium: 3.4 mmol/L — ABNORMAL LOW (ref 3.5–5.1)
Sodium: 141 mmol/L (ref 135–145)
Total Protein: 5.7 g/dL — ABNORMAL LOW (ref 6.5–8.1)

## 2015-10-10 LAB — CULTURE, BODY FLUID-BOTTLE

## 2015-10-10 MED ORDER — CIPROFLOXACIN IN D5W 400 MG/200ML IV SOLN: 400.0000 mg | INTRAVENOUS | Status: DC

## 2015-10-10 MED ORDER — CIPROFLOXACIN HCL 500 MG PO TABS
500.0000 mg | ORAL_TABLET | Freq: Every day | ORAL | Status: DC
  Administered 2015-10-10 – 2015-10-11 (×2): 500 mg via ORAL
  Filled 2015-10-10 (×2): qty 1

## 2015-10-10 MED ORDER — CETYLPYRIDINIUM CHLORIDE 0.05 % MT LIQD
7.0000 mL | Freq: Two times a day (BID) | OROMUCOSAL | Status: DC
  Administered 2015-10-10 – 2015-10-11 (×3): 7 mL via OROMUCOSAL

## 2015-10-10 MED ORDER — CETYLPYRIDINIUM CHLORIDE 0.05 % MT LIQD: 7.0000 mL | Freq: Two times a day (BID) | OROMUCOSAL | Status: DC

## 2015-10-10 NOTE — Progress Notes (Signed)
This encounter was created in error - please disregard.

## 2015-10-10 NOTE — Progress Notes (Signed)
  Subjective: Stable and alert. Poor historian but not agitated.  Appears comfortable.  2 stools recorded.  No emesis recorded.  Tolerating by mouth's apparently. Gallbladder drain with clear fluid, mostly serous minimal bile tinge.  Nonpurulent.  70 mL.  Bile cultures growing gram-negative rods but no identification yet. On Zosyn  Lab work yesterday looked good.  Normal LFTs.  WBC 3800.  Hemoglobin 8.5.  Potassium 3.3.  Creatinine 1.48, stable   Objective: Vital signs in last 24 hours: Temp:  [98.8 F (37.1 C)-99.1 F (37.3 C)] 98.8 F (37.1 C) (04/16 0438) Pulse Rate:  [78-91] 78 (04/16 0438) Resp:  [16-20] 16 (04/16 0438) BP: (134-157)/(67-98) 144/67 mmHg (04/16 0438) SpO2:  [98 %-100 %] 100 % (04/16 0438) Last BM Date: 10/09/15  Intake/Output from previous day: 04/15 0701 - 04/16 0700 In: 1542.5 [P.O.:600; I.V.:892.5; IV Piggyback:50] Out: 70 [Drains:70] Intake/Output this shift: Total I/O In: -  Out: 70 [Drains:70]  General appearance: Alert.  Not agitated.  Asking lots of questions.  Not oriented to place or situation GI: Soft.  Nondistended.  Minimal tenderness around drain.  Drainage thin and clear and bile tinged.  Not purulent  Lab Results:   Recent Labs  10/09/15 0522  WBC 3.8*  HGB 8.5*  HCT 27.2*  PLT 259   BMET  Recent Labs  10/09/15 0522  NA 140  K 3.3*  CL 107  CO2 24  GLUCOSE 96  BUN 12  CREATININE 1.48*  CALCIUM 9.2   PT/INR  Recent Labs  10/07/15 0854  LABPROT 15.7*  INR 1.23   ABG No results for input(s): PHART, HCO3 in the last 72 hours.  Invalid input(s): PCO2, PO2  Studies/Results: No results found.  Anti-infectives: Anti-infectives    Start     Dose/Rate Route Frequency Ordered Stop   10/06/15 0600  piperacillin-tazobactam (ZOSYN) IVPB 3.375 g     3.375 g 12.5 mL/hr over 240 Minutes Intravenous 3 times per day 10/06/15 0354     10/06/15 0100  piperacillin-tazobactam (ZOSYN) IVPB 3.375 g     3.375 g 100 mL/hr  over 30 Minutes Intravenous  Once 10/06/15 0048 10/06/15 0309      Assessment/Plan:   Acute cholecystitis. Stable following percutaneous cholecystostomy 4/13 Bile not purulent but growing gram-negative rods Advance diet as tolerated Continue IV Zosyn  PT consult to increase mobilization  Advanced squamous cell lung cancer Dementia, without behavior disturbance. On Aricept and Seroquel Hypertension. Continue amlodipine and Vasotec.   LOS: 4 days    Amneet Cendejas M 10/10/2015

## 2015-10-10 NOTE — Progress Notes (Signed)
[facility] PROGRESS NOTE  [name] DSK:[id number] DOB: [birth year] DOA: 10/05/2015 PCP: Mickie Hillier, MD  HPI/Brief narrative [phone] who presented with complaints of right upper quadrant abdominal pain which has been going on for past few days prior to admission. Patient found to have gallbladder distention, mural thickening and adjacent stranding with cholelithiasis.  Assessment/Plan: Acute cholecystitis General surgery following Had been continued on Zosyn per Surgery recs HIDA scan done per surgery recs with findings c/w acute cholecystitis. Patient underwent chole drain placement on 4/13 by IR per Surgery recs Diet is advanced to regular diet Drain culture demonstrates klebsiella species resistant to zosyn. Will transition to cipro per sensitivities  Hypertension Continued amlodipine, Vasotec  Squamous cell carcinoma of lung Patient has squamous cell carcinoma of the lung, with questionable liver lesions Patient followed by oncology as outpatient  Dementia, without behavior disturbance Continue Aricept, Seroquel  Code status: Full code  Constipation: Cathartics ordered. Should help with appetitie  Family Communication: Pt in room, family at bedside Disposition Plan: Possible d/c home with home PT in 48-72hrs   Consultants:  General Surgery  IR  Procedures:  Cholecystostomy tube placement by IR 4/13  Antibiotics: Anti-infectives    Start     Dose/Rate Route Frequency Ordered Stop   10/10/15 1500  ciprofloxacin (CIPRO) IVPB 400 mg  Status:  Discontinued     400 mg 200 mL/hr over 60 Minutes Intravenous Every 24 hours 10/10/15 1421 10/10/15 1423   10/10/15 1500  ciprofloxacin (CIPRO) tablet 500 mg     500 mg Oral Daily 10/10/15 1424     10/06/15 0600  piperacillin-tazobactam (ZOSYN) IVPB 3.375 g  Status:  Discontinued     3.375 g 12.5 mL/hr over 240 Minutes Intravenous 3 times per day 10/06/15 0354 10/10/15 1304   10/06/15 0100  piperacillin-tazobactam  (ZOSYN) IVPB 3.375 g     3.375 g 100 mL/hr over 30 Minutes Intravenous  Once 10/06/15 0048 10/06/15 0309      HPI/Subjective: No complaints at present  Objective: Filed Vitals:   10/10/15 0438 10/10/15 1006 10/10/15 1605 10/10/15 1606  BP: 144/67 152/82 150/73   Pulse: 78 103 80 80  Temp: 98.8 F (37.1 C)   99.2 F (37.3 C)  TempSrc:    Oral  Resp: 16  18   Height:      Weight:      SpO2: 100%  99% 100%    Intake/Output Summary (Last 24 hours) at 10/10/15 1740 Last data filed at 10/10/15 0659  Gross per 24 hour  Intake 2216.25 ml  Output     70 ml  Net 2146.25 ml   Filed Weights   10/05/15 2051 10/06/15 0400  Weight: 66.225 kg (146 lb) 65.363 kg (144 lb 1.6 oz)    Exam:   General:  Awake, in nad, laying in bed  Cardiovascular: regular, s1, s2  Respiratory: normal resp effort, no wheezing  Abdomen: soft,  pos BS, nondistended  Musculoskeletal: perfused, no cyanosis  Data Reviewed: Basic Metabolic Panel:  Recent Labs Lab 10/05/15 2145 10/06/15 0812 10/07/15 0428 10/09/15 0522 10/10/15 0520  NA 139 140 139 140 141  K 3.6 3.6 3.9 3.3* 3.4*  CL 105 105 105 107 109  CO2 '24 24 22 24 23  '$ GLUCOSE 122* 99 80 96 114*  BUN '15 11 12 12 9  '$ CREATININE 1.83* 1.66* 1.67* 1.48* 1.58*  CALCIUM 9.3 9.3 8.8* 9.2 9.3   Liver Function Tests:  Recent Labs Lab 10/05/15 2145 10/06/15 5597 10/07/15 0428  10/09/15 0522 10/10/15 0520  AST '26 22 20 19 21  '$ ALT 10* 10* 10* 8* 9*  ALKPHOS 36* 34* 25* 23* 24*  BILITOT 0.4 0.8 0.8 0.5 0.6  PROT 7.1 6.0* 5.4* 5.4* 5.7*  ALBUMIN 4.3 3.4* 3.1* 2.8* 2.8*    Recent Labs Lab 10/05/15 2145 10/09/15 0522  LIPASE 32 29   No results for input(s): AMMONIA in the last 168 hours. CBC:  Recent Labs Lab 10/05/15 2145 10/06/15 0812 10/09/15 0522  WBC 7.7 6.5 3.8*  NEUTROABS 6.2  --   --   HGB 10.1* 9.7* 8.5*  HCT 30.8* 29.7* 27.2*  MCV 103.0* 102.8* 100.7*  PLT 332 299 259   Cardiac Enzymes: No results for  input(s): CKTOTAL, CKMB, CKMBINDEX, TROPONINI in the last 168 hours. BNP (last 3 results)  Recent Labs  11/15/14 2043  BNP 280.0*    ProBNP (last 3 results) No results for input(s): PROBNP in the last 8760 hours.  CBG:  Recent Labs Lab 10/09/15 1159  GLUCAP 139*    Recent Results (from the past 240 hour(s))  Culture, body fluid-bottle     Status: Abnormal   Collection Time: 10/07/15  6:58 PM  Result Value Ref Range Status   Specimen Description FLUID GALL BLADDER  Final   Special Requests BOTTLES DRAWN AEROBIC AND ANAEROBIC 10CC  Final   Gram Stain   Final    GRAM NEGATIVE RODS IN BOTH AEROBIC AND ANAEROBIC BOTTLES CRITICAL RESULT CALLED TO, READ BACK BY AND VERIFIED WITH: J [facility] AT 8841 10/08/15 BY L BENFIELD    Culture KLEBSIELLA PNEUMONIAE (A)  Final   Report Status 10/10/2015 FINAL  Final   Organism ID, Bacteria KLEBSIELLA PNEUMONIAE  Final      Susceptibility   Klebsiella pneumoniae - MIC*    AMPICILLIN >=32 RESISTANT Resistant     CEFAZOLIN >=64 RESISTANT Resistant     CEFEPIME <=1 SENSITIVE Sensitive     CEFTAZIDIME <=1 SENSITIVE Sensitive     CEFTRIAXONE <=1 SENSITIVE Sensitive     CIPROFLOXACIN <=0.25 SENSITIVE Sensitive     GENTAMICIN <=1 SENSITIVE Sensitive     IMIPENEM <=0.25 SENSITIVE Sensitive     TRIMETH/SULFA <=20 SENSITIVE Sensitive     AMPICILLIN/SULBACTAM >=32 RESISTANT Resistant     PIP/TAZO >=128 RESISTANT Resistant     * KLEBSIELLA PNEUMONIAE  Gram stain     Status: None   Collection Time: 10/07/15  6:58 PM  Result Value Ref Range Status   Specimen Description FLUID GALL BLADDER  Final   Special Requests NONE  Final   Gram Stain   Final    ABUNDANT WBC PRESENT, PREDOMINANTLY PMN NO ORGANISMS SEEN    Report Status 10/07/2015 FINAL  Final     Studies: No results found.  Scheduled Meds: . allopurinol  300 mg Oral Daily  . ALPRAZolam  1 mg Oral TID  . amLODipine  5 mg Oral Daily  . antiseptic oral rinse  7 mL Mouth Rinse BID   . aspirin EC  81 mg Oral Daily  . ciprofloxacin  500 mg Oral Daily  . docusate sodium  100 mg Oral BID  . donepezil  5 mg Oral QHS  . DULoxetine  60 mg Oral Daily  . enoxaparin (LOVENOX) injection  30 mg Subcutaneous Q24H  . folic acid  1 mg Oral Daily  . hydrALAZINE  25 mg Oral TID  . polyethylene glycol  17 g Oral Daily  . QUEtiapine  25 mg Oral QHS  Continuous Infusions: . sodium chloride 75 mL/hr at 10/10/15 6979    Active Problems:   Squamous cell carcinoma of left lung (HCC)   Moderate dementia without behavioral disturbance   Malignant neoplasm of lower lobe of left lung ([place])   Acute cholecystitis   Malnutrition of moderate degree   Petrice Beedy, [place] [facility] Pager [phone]. If 7PM-7AM, please contact night-coverage at [email or web address], password [facility] 10/10/2015, 5:40 PM  LOS: 4 days

## 2015-10-11 ENCOUNTER — Ambulatory Visit (HOSPITAL_COMMUNITY): Payer: Medicare Other | Admitting: Oncology

## 2015-10-11 MED ORDER — ENSURE ENLIVE PO LIQD: 237.0000 mL | Freq: Two times a day (BID) | ORAL | Status: DC

## 2015-10-11 MED ORDER — CIPROFLOXACIN HCL 500 MG PO TABS: 500.0000 mg | ORAL_TABLET | Freq: Every day | ORAL | Status: DC

## 2015-10-11 NOTE — Progress Notes (Signed)
Discussed with patient and family discharge instructions, all verbalized agreement and understanding.  Patient's IV was discontinued with no complications.  Patient to go down in wheelchair with all belongings to go home in private vehicle.

## 2015-10-11 NOTE — Progress Notes (Signed)
PT Cancellation Note  Patient Details Name: [name] MRN: [id number] DOB: April 29, 1935   Cancelled Treatment:    Reason Eval/Treat Not Completed: Patient declined, states that she may go home later and she does not want to do any physical therapy. Pt denies any questions or concerns regarding mobility.    Cassell Clement, PT, CSCS Pager [phone] Office [phone]  10/11/2015, 2:39 PM

## 2015-10-11 NOTE — Care Management Note (Signed)
Case Management Note  Patient Details  Name: [name] MRN: [id number] Date of Birth: 1935/03/20  Subjective/Objective:                    Action/Plan: Discharging home today with home health services(RN,PT).  Expected Discharge Date:    10/11/2015              Expected Discharge Plan:  [place]  In-House Referral:     Discharge planning Services  CM Consult  Post Acute Care Choice:    Choice offered to:  Patient  DME Arranged:  Oxygen (active with [facility] for oxygen) DME Agency:  [place]:  PT, RN [facility] Agency:  [place]  Status of Service:  Completed, signed off        Sharin Mons, [place] [phone] 10/11/2015, 3:19 PM

## 2015-10-11 NOTE — Discharge Summary (Signed)
Physician Discharge Summary  [name] VVO:[id number] DOB: 02-18-1935 DOA: 10/05/2015  PCP: Mickie Hillier, MD  Admit date: 10/05/2015 Discharge date: 10/11/2015  Time spent: 20 minutes  Recommendations for Outpatient Follow-up:  1. Follow up with PCP in 2-3 weeks 2. Follow up with Dr. Redmond Pulling in 3-4 weeks  Discharge Diagnoses:  Active Problems:   Squamous cell carcinoma of left lung (HCC)   Moderate dementia without behavioral disturbance   Malignant neoplasm of lower lobe of left lung (HCC)   Acute cholecystitis   Malnutrition of moderate degree  Discharge Condition: Improved  Diet recommendation: Regular  Filed Weights   10/05/15 2051 10/06/15 0400  Weight: 66.225 kg (146 lb) 65.363 kg (144 lb 1.6 oz)    History of present illness:  Please review dictated H and P from 4/11 for details. Briefly, 81yo who presented with complaints of right upper quadrant abdominal pain which has been going on for past few days prior to admission. Patient found to have gallbladder distention, mural thickening and adjacent stranding with cholelithiasis.  Hospital Course:  Acute cholecystitis General surgery following Initially continued on Zosyn per Surgery recs HIDA scan was done per surgery recs with findings c/w acute cholecystitis. Patient underwent chole drain placement on 4/13 by IR per Surgery recs Diet was advanced to regular diet Drain culture demonstrates klebsiella species resistant to zosyn. Transitioned to cipro per sensitivities  Hypertension Continued amlodipine, Vasotec  Squamous cell carcinoma of lung Patient has squamous cell carcinoma of the lung, with questionable liver lesions Patient followed by oncology as outpatient  Dementia, without behavior disturbance Continue Aricept, Seroquel  Code status: Full code  Constipation: Cathartics ordered. Should help with appetitie  Procedures:  Cholecystostomy tube placement by IR 4/13  Consultations:  General  Surgery  IR  Discharge Exam: Filed Vitals:   10/10/15 1605 10/10/15 1606 10/10/15 2048 10/11/15 0522  BP: 150/73  164/73 146/70  Pulse: 80 80 81 74  Temp:  99.2 F (37.3 C) 98.7 F (37.1 C) 98.9 F (37.2 C)  TempSrc:  Oral Oral Oral  Resp: '18  18 22  '$ Height:      Weight:      SpO2: 99% 100% 100% 100%    General: Awake, in nad Cardiovascular: regular, s1, s2 Respiratory: normal resp effort, no wheezing  Discharge Instructions     Medication List    TAKE these medications        acetaminophen 500 MG tablet  Commonly known as:  TYLENOL  Take 500 mg by mouth every 6 (six) hours as needed for mild pain or moderate pain.     allopurinol 300 MG tablet  Commonly known as:  ZYLOPRIM  take 1 tablet by mouth once daily     ALPRAZolam 1 MG tablet  Commonly known as:  XANAX  TAKE ONE-HALF TO ONE TABLET BY MOUTH THREE TIMES DAILY AS NEEDED FOR ANXIETY     amLODipine 5 MG tablet  Commonly known as:  NORVASC  take 1 tablet by mouth once daily     aspirin EC 81 MG tablet  Take 81 mg by mouth daily.     ciprofloxacin 500 MG tablet  Commonly known as:  CIPRO  Take 1 tablet (500 mg total) by mouth daily.     donepezil 10 MG tablet  Commonly known as:  ARICEPT  Take 1 tablet daily     DULoxetine 60 MG capsule  Commonly known as:  CYMBALTA  take 1 capsule by mouth once daily  enalapril 5 MG tablet  Commonly known as:  VASOTEC  Take 5 mg by mouth daily.     fenofibrate 160 MG tablet  take 1 tablet by mouth once daily with food     ferrous sulfate 325 (65 FE) MG tablet  Commonly known as:  FERROUSUL  Take 1 tablet (325 mg total) by mouth 2 (two) times daily after a meal.     folic acid 1 MG tablet  Commonly known as:  FOLVITE  Take 1 tablet (1 mg total) by mouth daily.     hydrALAZINE 25 MG tablet  Commonly known as:  APRESOLINE  take 1 tablet by mouth three times a day     HYDROcodone-acetaminophen 5-325 MG tablet  Commonly known as:  NORCO/VICODIN   Take 1 tablet twice a day as needed for pain     levothyroxine 137 MCG tablet  Commonly known as:  SYNTHROID, LEVOTHROID  take 1 tablet by mouth once daily     lidocaine-prilocaine cream  Commonly known as:  EMLA  Apply 1 application topically as needed (port).     loperamide 2 MG capsule  Commonly known as:  IMODIUM  Take 2 mg by mouth 3 (three) times daily as needed for diarrhea or loose stools.     meclizine 25 MG tablet  Commonly known as:  ANTIVERT  Take 1 tablet (25 mg total) by mouth 3 (three) times daily as needed for dizziness.     MUSCLE RUB 10-15 % Crea  Apply 1 application topically as needed for muscle pain.     ondansetron 4 MG disintegrating tablet  Commonly known as:  ZOFRAN-ODT  dissolve 1 tablet ON TONGUE every 8 hours if needed for nausea and vomiting     pantoprazole 40 MG tablet  Commonly known as:  PROTONIX  Take 1 tablet (40 mg total) by mouth daily.     potassium chloride 10 MEQ tablet  Commonly known as:  K-DUR  Take 2 tabs daily     pravastatin 20 MG tablet  Commonly known as:  PRAVACHOL  take 1 tablet by mouth once daily     QUEtiapine 25 MG tablet  Commonly known as:  SEROQUEL  Take 1 tablet (25 mg total) by mouth at bedtime.     SPIRIVA HANDIHALER 18 MCG inhalation capsule  Generic drug:  tiotropium  Place 18 mcg into inhaler and inhale as needed (for shortness of breath).     traMADol 50 MG tablet  Commonly known as:  ULTRAM  take 1 tablet by mouth every 6 hours if needed     Vitamin D (Ergocalciferol) 50000 units Caps capsule  Commonly known as:  DRISDOL  Take 1 capsule (50,000 Units total) by mouth once a week.       Allergies  Allergen Reactions  . Aleve [Naproxen Sodium] Other (See Comments)    GI bleed  . Dilaudid [Hydromorphone Hcl]     Can not tolerate  . Dyazide [Hydrochlorothiazide W-Triamterene] Other (See Comments)    cramps  . Zithromax [Azithromycin] Itching   Follow-up Information    Call Gayland Curry,  MD.   Specialty:  General Surgery   Why:  For post-hospital follow up    Contact information:   [name] [address] [phone]        The results of significant diagnostics from this hospitalization (including imaging, microbiology, ancillary and laboratory) are listed below for reference.    Significant Diagnostic Studies: Ct Abdomen  Pelvis Wo Contrast  10/06/2015  ADDENDUM REPORT: 10/06/2015 02:13 ADDENDUM: Prior reports have been obtained. The left hepatic lobe liver lesion is enlarged from 08/27/2014, as reported on the 10/04/2015 CT. The right hepatic lobe dome lesion is stable. Nonemergent abdominal MRI without/with contrast would be helpful for further evaluation of the enlarging left hepatic lobe lesion, if clinically warranted. The left adrenal nodule is unchanged from 08/27/2014. Electronically Signed   By: Andreas Newport M.D.   On: 10/06/2015 02:13  10/06/2015  CLINICAL DATA:  Dull right-sided abdominal pain, onset this morning. Nausea and chills. EXAM: CT ABDOMEN AND PELVIS WITHOUT CONTRAST TECHNIQUE: Multidetector CT imaging of the abdomen and pelvis was performed following the standard protocol without IV contrast. COMPARISON:  10/04/2015 FINDINGS: The gallbladder is distended, and there is an 11 mm calculus in the gallbladder neck. There is mild stranding around the gallbladder and a suggestion of gallbladder mural thickening. The findings may represent acute cholecystitis. No extrahepatic bile duct dilatation. There is mild right hydronephrosis without visible ureteral calculus. Left collecting system is mildly fold but not frankly hydronephrotic. Otherwise unremarkable appearances of the kidneys. No urinary calculi are evident. 2.7 cm low-attenuation liver lesion in the left lobe anteriorly, as well as a 1.7 cm low-attenuation lesion in the right hepatic lobe superiorly. These are indeterminate. There are unremarkable unenhanced appearances of the  spleen and pancreas. There is a 2.8 cm left adrenal nodule. Extensive prior bowel surgery. No evidence of bowel obstruction. No extraluminal air. Abdominal aortic graft, appearing intact on this unenhanced scan. No significant skeletal lesion. Unchanged consolidation in the left lung base medially. IMPRESSION: 1. New gallbladder distention, mural thickening and adjacent stranding. Cholelithiasis. This may represent acute cholecystitis. Recommend right upper quadrant sonography to evaluate. 2. Mild right hydronephrosis and mild fullness of the left renal collecting system. No ureteral calculi are evident. 3. Low-attenuation liver lesions, not characterized. Left adrenal nodule, not characterized. Prior studies not currently available to assess stability. Electronically Signed: By: Andreas Newport M.D. On: 10/06/2015 00:36   Ct Abdomen Pelvis Wo Contrast  10/04/2015  CLINICAL DATA:  Lung cancer.  Chemotherapy and radiation therapy. EXAM: CT CHEST, ABDOMEN AND PELVIS WITHOUT CONTRAST TECHNIQUE: Multidetector CT imaging of the chest, abdomen and pelvis was performed following the standard protocol without IV contrast. COMPARISON:  CT chest 04/13/2015 and CT abdomen pelvis 08/27/2014. FINDINGS: CT CHEST FINDINGS Mediastinum/Lymph Nodes: Right IJ Port-A-Cath terminates in the SVC. No pathologically enlarged mediastinal or axillary lymph nodes. Hilar regions are difficult to definitively evaluate without IV contrast. Pulmonary arteries are enlarged. Coronary artery calcification. Heart is at the upper limits of normal in size to mildly enlarged. Small amount of pericardial fluid may be physiologic. Pre pericardiac lymph nodes are sub cm in short axis size. Lungs/Pleura: Minimal biapical pleural parenchymal scarring. Moderate centrilobular emphysema. Minimal paraseptal emphysema. Post treatment collapse/ consolidation bronchiectasis in the medial aspect of the left hemi thorax. Small left pleural effusion, slightly  decreased. No right pleural fluid. There is narrowing of the left lower lobe bronchi. Airway is otherwise unremarkable. Musculoskeletal: No worrisome lytic or sclerotic lesions. CT ABDOMEN PELVIS FINDINGS Hepatobiliary: Intermediate attenuation lesion in segment 4 of the liver measures 2.9 cm (2/55), previously 1.9 cm on 08/27/2014 and 11 mm on 10/24/2013. 2.0 cm low-attenuation lesion in the dome of the liver (2/48), stable. Stone is seen in the gallbladder. No biliary ductal dilatation. Pancreas: Negative. Spleen: Negative. Adrenals/Urinary Tract: Right adrenal gland is unremarkable. Left adrenal mass measures 2.5 x 2.9 cm  and is fluid in density, stable. Kidneys are unremarkable. Ureters are decompressed. Bladder is somewhat low in volume. Stomach/Bowel: Stomach and proximal small bowel are unremarkable. Subtotal colectomy. Rectosigmoid colon is unremarkable. Vascular/Lymphatic: Atherosclerotic calcification of the arterial vasculature with an infrarenal aortic aneurysm repair. Infrarenal aorta measures up to 3.0 cm, stable. Retroperitoneal lymph nodes measure up to 10 mm in the left periaortic station, previously 7 mm. Reproductive: Hysterectomy.  Ovaries are atrophic. Other: Scattered surgical clips in the abdomen. Mesenteries and peritoneum are otherwise unremarkable. No free fluid. Musculoskeletal: No worrisome lytic or sclerotic lesions. Degenerative changes are seen in the spine. IMPRESSION: 1. Enlarging intermediate density segment 4 liver lesion. Metastatic disease cannot be excluded. Consider further evaluation with repeat MR abdomen (preferably without and with contrast), as clinically indicated. 2. Posttreatment changes in the left hemi thorax. Associated small left pleural effusion, slightly decreased. 3. Enlarged pulmonary arteries, indicative of pulmonary arterial hypertension. 4. Coronary artery calcification. 5. Cholelithiasis. 6. Left adrenal adenoma. Electronically Signed   By: Lorin Picket  M.D.   On: 10/04/2015 13:57   Ct Chest Wo Contrast  10/04/2015  CLINICAL DATA:  Lung cancer.  Chemotherapy and radiation therapy. EXAM: CT CHEST, ABDOMEN AND PELVIS WITHOUT CONTRAST TECHNIQUE: Multidetector CT imaging of the chest, abdomen and pelvis was performed following the standard protocol without IV contrast. COMPARISON:  CT chest 04/13/2015 and CT abdomen pelvis 08/27/2014. FINDINGS: CT CHEST FINDINGS Mediastinum/Lymph Nodes: Right IJ Port-A-Cath terminates in the SVC. No pathologically enlarged mediastinal or axillary lymph nodes. Hilar regions are difficult to definitively evaluate without IV contrast. Pulmonary arteries are enlarged. Coronary artery calcification. Heart is at the upper limits of normal in size to mildly enlarged. Small amount of pericardial fluid may be physiologic. Pre pericardiac lymph nodes are sub cm in short axis size. Lungs/Pleura: Minimal biapical pleural parenchymal scarring. Moderate centrilobular emphysema. Minimal paraseptal emphysema. Post treatment collapse/ consolidation bronchiectasis in the medial aspect of the left hemi thorax. Small left pleural effusion, slightly decreased. No right pleural fluid. There is narrowing of the left lower lobe bronchi. Airway is otherwise unremarkable. Musculoskeletal: No worrisome lytic or sclerotic lesions. CT ABDOMEN PELVIS FINDINGS Hepatobiliary: Intermediate attenuation lesion in segment 4 of the liver measures 2.9 cm (2/55), previously 1.9 cm on 08/27/2014 and 11 mm on 10/24/2013. 2.0 cm low-attenuation lesion in the dome of the liver (2/48), stable. Stone is seen in the gallbladder. No biliary ductal dilatation. Pancreas: Negative. Spleen: Negative. Adrenals/Urinary Tract: Right adrenal gland is unremarkable. Left adrenal mass measures 2.5 x 2.9 cm and is fluid in density, stable. Kidneys are unremarkable. Ureters are decompressed. Bladder is somewhat low in volume. Stomach/Bowel: Stomach and proximal small bowel are unremarkable.  Subtotal colectomy. Rectosigmoid colon is unremarkable. Vascular/Lymphatic: Atherosclerotic calcification of the arterial vasculature with an infrarenal aortic aneurysm repair. Infrarenal aorta measures up to 3.0 cm, stable. Retroperitoneal lymph nodes measure up to 10 mm in the left periaortic station, previously 7 mm. Reproductive: Hysterectomy.  Ovaries are atrophic. Other: Scattered surgical clips in the abdomen. Mesenteries and peritoneum are otherwise unremarkable. No free fluid. Musculoskeletal: No worrisome lytic or sclerotic lesions. Degenerative changes are seen in the spine. IMPRESSION: 1. Enlarging intermediate density segment 4 liver lesion. Metastatic disease cannot be excluded. Consider further evaluation with repeat MR abdomen (preferably without and with contrast), as clinically indicated. 2. Posttreatment changes in the left hemi thorax. Associated small left pleural effusion, slightly decreased. 3. Enlarged pulmonary arteries, indicative of pulmonary arterial hypertension. 4. Coronary artery calcification. 5. Cholelithiasis. 6.  Left adrenal adenoma. Electronically Signed   By: Lorin Picket M.D.   On: 10/04/2015 13:57   US Abdomen Complete  10/06/2015  CLINICAL DATA:  Lung cancer. COPD. Findings suggestive of acute cholecystitis on CT study performed 1 day prior. EXAM: ABDOMEN ULTRASOUND COMPLETE COMPARISON:  10/05/2015 CT abdomen/ pelvis. 08/20/2006 abdominal sonogram. FINDINGS: Gallbladder: Nonmobile 1.4 cm calcified shadowing gallstone in the gallbladder neck. Mildly distended gallbladder. Mild to moderate diffuse gallbladder wall thickening. Trace pericholecystic fluid. No sonographic Murphy sign. Common bile duct: Diameter: 5 mm Liver: Hypodense 2.2 x 2.1 x 2.2 cm liver mass in the anterior left liver lobe. No additional liver masses are detected. Background liver parenchymal echogenicity and echotexture are within normal limits. IVC: No abnormality visualized. Pancreas: Visualized  portion unremarkable. Spleen: Size and appearance within normal limits. Right Kidney: Length: 9.8 cm. Mildly echogenic right kidney. No right hydronephrosis. No right renal mass. Left Kidney: Length: 10.2 cm. Mildly echogenic left kidney. No left hydronephrosis. No left renal mass. Abdominal aorta: No aneurysm visualized. Other findings: None. IMPRESSION: 1. Nonmobile gallstone in the gallbladder neck with mild gallbladder distention, diffuse gallbladder wall thickening, trace pericholecystic fluid and no sonographic Murphy's sign. Sonographic findings are suspicious but not definitive for acute cholecystitis given the absence of sonographic Murphy sign. Consider correlation with hepatobiliary scintigraphy as clinically warranted. 2. No biliary ductal dilatation. 3. Indeterminate hypodense 2.2 cm left liver lobe mass, liver metastasis not excluded. 4. No hydronephrosis. Echogenic kidneys, a nonspecific finding indicating renal parenchymal disease of uncertain chronicity. Electronically Signed   By: Ilona Sorrel M.D.   On: 10/06/2015 10:06   Nm Hepato W/eject Fract  10/06/2015  CLINICAL DATA:  81 year old female with nonmobile gallstone in the neck of the gallbladder with ultrasound findings suspicious for acute cholecystitis discovered during imaging for right abdominal pain. Initial encounter. EXAM: NUCLEAR MEDICINE HEPATOBILIARY IMAGING TECHNIQUE: Sequential images of the abdomen were obtained out to 60 minutes following intravenous administration of radiopharmaceutical. RADIOPHARMACEUTICALS:  5.1 mCi Tc-65m Choletec IV COMPARISON:  Ultrasound 0647 hours today. CT Abdomen and Pelvis 10/05/2015 FINDINGS: Prompt radiotracer uptake by the liver and clearance of the blood pool. Prompt CBD and small bowel activity by 10 minutes. Over the first hour of the study, no gallbladder activity occurred. Subsequently, the study was augmented with morphine. 30 minutes of additional imaging occurred, with no gallbladder  activity. IMPRESSION: Absent gallbladder activity despite augmentation of this study with morphine, further raising suspicion of Acute Cholecystitis. CBD is patent. Electronically Signed   By: HGenevie AnnM.D.   On: 10/06/2015 14:21   Ct Image Guided Drainage By Percutaneous Catheter  10/07/2015  INDICATION: 81year old with acute cholecystitis. Patient is not a good surgical candidate and request for percutaneous cholecystostomy tube placement EXAM: CT GUIDED PLACEMENT OF CHOLECYSTOSTOMY TUBE MEDICATIONS: The patient is currently admitted to the hospital and receiving intravenous antibiotics. The antibiotics were administered within an appropriate time frame prior to the initiation of the procedure. ANESTHESIA/SEDATION: 0.5 mg IV Versed 50 mcg IV Fentanyl Moderate Sedation Time:  24 minutes The patient was continuously monitored during the procedure by the interventional radiology nurse under my direct supervision. COMPLICATIONS: None immediate. TECHNIQUE: Informed written consent was obtained from the patient after a thorough discussion of the procedural risks, benefits and alternatives. All questions were addressed. A timeout was performed prior to the initiation of the procedure. PROCEDURE: Patient was placed supine on the CT scanner. Images through the abdomen were obtained. The distended gallbladder was targeted. The right side  of the abdomen was prepped with chlorhexidine and a sterile field was created. Skin was anesthetized with 1% lidocaine. Using CT guidance, an 18 gauge trocar needle was directed into the distended gallbladder. Stiff Amplatz wire was advanced into the gallbladder. The tract was dilated to accommodate a 10 [place] drain. 10 French drain was advanced over the wire and reconstituted the gallbladder. Greater than 90 mL of cloudy yellow fluid was removed. Catheter was sutured to skin and attached to gravity bag. FINDINGS: Distended gallbladder with surrounding fluid. Drain placed within the  gallbladder. Decompression of the gallbladder following drainage. Cloudy yellow fluid was removed and sent for culture. IMPRESSION: CT-guided percutaneous cholecystostomy tube placement. Electronically Signed   By: Markus Daft M.D.   On: 10/07/2015 19:39    Microbiology: Recent Results (from the past 240 hour(s))  Culture, body fluid-bottle     Status: Abnormal   Collection Time: 10/07/15  6:58 PM  Result Value Ref Range Status   Specimen Description FLUID GALL BLADDER  Final   Special Requests BOTTLES DRAWN AEROBIC AND ANAEROBIC 10CC  Final   Gram Stain   Final    GRAM NEGATIVE RODS IN BOTH AEROBIC AND ANAEROBIC BOTTLES CRITICAL RESULT CALLED TO, READ BACK BY AND VERIFIED WITH: J EDWARDS,RN AT 1443 10/08/15 BY L BENFIELD    Culture KLEBSIELLA PNEUMONIAE (A)  Final   Report Status 10/10/2015 FINAL  Final   Organism ID, Bacteria KLEBSIELLA PNEUMONIAE  Final      Susceptibility   Klebsiella pneumoniae - MIC*    AMPICILLIN >=32 RESISTANT Resistant     CEFAZOLIN >=64 RESISTANT Resistant     CEFEPIME <=1 SENSITIVE Sensitive     CEFTAZIDIME <=1 SENSITIVE Sensitive     CEFTRIAXONE <=1 SENSITIVE Sensitive     CIPROFLOXACIN <=0.25 SENSITIVE Sensitive     GENTAMICIN <=1 SENSITIVE Sensitive     IMIPENEM <=0.25 SENSITIVE Sensitive     TRIMETH/SULFA <=20 SENSITIVE Sensitive     AMPICILLIN/SULBACTAM >=32 RESISTANT Resistant     PIP/TAZO >=128 RESISTANT Resistant     * KLEBSIELLA PNEUMONIAE  Gram stain     Status: None   Collection Time: 10/07/15  6:58 PM  Result Value Ref Range Status   Specimen Description FLUID GALL BLADDER  Final   Special Requests NONE  Final   Gram Stain   Final    ABUNDANT WBC PRESENT, PREDOMINANTLY PMN NO ORGANISMS SEEN    Report Status 10/07/2015 FINAL  Final     Labs: Basic Metabolic Panel:  Recent Labs Lab 10/05/15 2145 10/06/15 0812 10/07/15 0428 10/09/15 0522 10/10/15 0520  NA 139 140 139 140 141  K 3.6 3.6 3.9 3.3* 3.4*  CL 105 105 105 107 109   CO2 '24 24 22 24 23  '$ GLUCOSE 122* 99 80 96 114*  BUN '15 11 12 12 9  '$ CREATININE 1.83* 1.66* 1.67* 1.48* 1.58*  CALCIUM 9.3 9.3 8.8* 9.2 9.3   Liver Function Tests:  Recent Labs Lab 10/05/15 2145 10/06/15 0812 10/07/15 0428 10/09/15 0522 10/10/15 0520  AST '26 22 20 19 21  '$ ALT 10* 10* 10* 8* 9*  ALKPHOS 36* 34* 25* 23* 24*  BILITOT 0.4 0.8 0.8 0.5 0.6  PROT 7.1 6.0* 5.4* 5.4* 5.7*  ALBUMIN 4.3 3.4* 3.1* 2.8* 2.8*    Recent Labs Lab 10/05/15 2145 10/09/15 0522  LIPASE 32 29   No results for input(s): AMMONIA in the last 168 hours. CBC:  Recent Labs Lab 10/05/15 2145 10/06/15 0812 10/09/15 0522  WBC  7.7 6.5 3.8*  NEUTROABS 6.2  --   --   HGB 10.1* 9.7* 8.5*  HCT 30.8* 29.7* 27.2*  MCV 103.0* 102.8* 100.7*  PLT 332 299 259   Cardiac Enzymes: No results for input(s): CKTOTAL, CKMB, CKMBINDEX, TROPONINI in the last 168 hours. BNP: BNP (last 3 results)  Recent Labs  11/15/14 2043  BNP 280.0*    ProBNP (last 3 results) No results for input(s): PROBNP in the last 8760 hours.  CBG:  Recent Labs Lab 10/09/15 1159  GLUCAP 139*    Signed:  Kateline Kinkade K  [facility] 10/11/2015, 2:50 PM

## 2015-10-11 NOTE — Care Management Important Message (Signed)
Important Message  Patient Details  Name: [name] MRN: [id number] Date of Birth: [birth year]   Medicare Important Message Given:  Yes    Trey Gulbranson P Ocie Tino 10/11/2015, 2:03 PM

## 2015-10-11 NOTE — Consult Note (Signed)
   [facility] CM Inpatient Consult   10/11/2015  [name] 07/27/1934 [id number] Referral received to assess for care management services.    Met with the patient regarding the benefits of [place] [facility] services .Explained that [place] [facility] is a covered benefit of insurance. Review information for [facility] Care Management and a brochure was provided with contact information.  Explained that [place] [facility] does not interfere with or replace any services arranged by the inpatient care management staff.  Patient states she has home care and would like to discuss any more services with her daughter.  [place] and contact information was left at the bedside.  For questions, please contact: Natividad Brood, RN BSN [facility] Liaison  [phone] business mobile phone Toll free office [phone]

## 2015-10-11 NOTE — Progress Notes (Signed)
[facility] Progress Note     Subjective: Pt "doesn't feel herself".  Denies N/V or abdominal pain.  Says her appetite is poor.  She says her drain came out of the clamp which was helping to hold it on (this is pending replacement).  No other complaints.    Objective: Vital signs in last 24 hours: Temp:  [98.7 F (37.1 C)-99.2 F (37.3 C)] 98.9 F (37.2 C) (04/17 0522) Pulse Rate:  [74-103] 74 (04/17 0522) Resp:  [18-22] 22 (04/17 0522) BP: (146-164)/(70-82) 146/70 mmHg (04/17 0522) SpO2:  [99 %-100 %] 100 % (04/17 0522) Last BM Date: 10/11/15  Intake/Output from previous day: 04/16 0701 - 04/17 0700 In: 1751.3 [P.O.:100; I.V.:1651.3] Out: 90 [Drains:90] Intake/Output this shift: Total I/O In: -  Out: 40 [Drains:40]  PE: Gen:  Alert, NAD, pleasant Abd: Soft, NT/ND, +BS, no HSM, RUQ perc chole drain with minimal serosanguinous drainage in gravity bag   Lab Results:   Recent Labs  10/09/15 0522  WBC 3.8*  HGB 8.5*  HCT 27.2*  PLT 259   BMET  Recent Labs  10/09/15 0522 10/10/15 0520  NA 140 141  K 3.3* 3.4*  CL 107 109  CO2 24 23  GLUCOSE 96 114*  BUN 12 9  CREATININE 1.48* 1.58*  CALCIUM 9.2 9.3   PT/INR No results for input(s): LABPROT, INR in the last 72 hours. CMP     Component Value Date/Time   NA 141 10/10/2015 0520   NA 142 09/02/2015 0927   K 3.4* 10/10/2015 0520   CL 109 10/10/2015 0520   CO2 23 10/10/2015 0520   GLUCOSE 114* 10/10/2015 0520   GLUCOSE 90 09/02/2015 0927   BUN 9 10/10/2015 0520   BUN 17 09/02/2015 0927   CREATININE 1.58* 10/10/2015 0520   CREATININE 2.07* 06/24/2014 1239   CALCIUM 9.3 10/10/2015 0520   PROT 5.7* 10/10/2015 0520   PROT 6.2 09/02/2015 0927   ALBUMIN 2.8* 10/10/2015 0520   ALBUMIN 4.0 09/02/2015 0927   AST 21 10/10/2015 0520   ALT 9* 10/10/2015 0520   ALKPHOS 24* 10/10/2015 0520   BILITOT 0.6 10/10/2015 0520   BILITOT 0.4 09/02/2015 0927   GFRNONAA 30* 10/10/2015 0520   GFRAA 35*  10/10/2015 0520   Lipase     Component Value Date/Time   LIPASE 29 10/09/2015 0522       Studies/Results: No results found.  Anti-infectives: Anti-infectives    Start     Dose/Rate Route Frequency Ordered Stop   10/10/15 1500  ciprofloxacin (CIPRO) IVPB 400 mg  Status:  Discontinued     400 mg 200 mL/hr over 60 Minutes Intravenous Every 24 hours 10/10/15 1421 10/10/15 1423   10/10/15 1500  ciprofloxacin (CIPRO) tablet 500 mg     500 mg Oral Daily 10/10/15 1424     10/06/15 0600  piperacillin-tazobactam (ZOSYN) IVPB 3.375 g  Status:  Discontinued     3.375 g 12.5 mL/hr over 240 Minutes Intravenous 3 times per day 10/06/15 0354 10/10/15 1304   10/06/15 0100  piperacillin-tazobactam (ZOSYN) IVPB 3.375 g     3.375 g 100 mL/hr over 30 Minutes Intravenous  Once 10/06/15 0048 10/06/15 0309       Assessment/Plan Acute cholecystitis. Stable following percutaneous cholecystostomy tube 4/13 - Dr. Anselm Pancoast -Bile not purulent but growing gram-negative rods - 17m serosanguinous drainage/24hr -Klebsiella sp resistant to Zosyn, so switched to oral Cipro which is sensitive - likely needs 7 days of treatment of the correct antibiotic -Tolerating  HH diet -PT consult to increase mobilization -Drain will stay in at least 6-8 weeks for drain study, but will need to have a discussion with med-onc given lung cancer with liver mets, f/u with Dr. Redmond Pulling at discharge in 3-4 weeks -Will need IR cholangiogram through tube at some point -Home soon when medically stable with drain.    Advanced squamous cell lung cancer Dementia, without behavior disturbance. On Aricept and Seroquel Hypertension. Continue amlodipine and Vasotec.    LOS: 5 days    [name] 10/11/2015, 7:51 AM Pager: [phone]  (7am - 4:30pm M-F; 7am - 11:30am Sa/Su)

## 2015-10-11 NOTE — Care Management Note (Addendum)
Case Management Note  Patient Details  Name: [name] MRN: [id number] Date of Birth: March 31, 1935  Subjective/Objective:                 Admitted with Cholecystitis,s/p cholecystostomy tube 10/07/2015. Culture positive for Klebsiella. From home with sister. Independent with ADL's. Owns walker/cane. PCP: [facility].   Action/Plan: Plan is possible d/c on 10/12/2015 with home health services(RN/PT).  Expected Discharge Date:   10/12/2015           Expected Discharge Plan:  [place]  In-House Referral:     Discharge planning Services  CM Consult  Post Acute Care Choice:    Choice offered to:  Patient  DME Arranged:  Oxygen (active with [facility] for oxygen) DME Agency:  [place]:  PT, RN [facility] Agency:  [place]  Status of Service:  In process, will continue to follow.  Medicare Important Message Given:   Date Medicare IM Given:    Medicare IM give by:    Date Additional Medicare IM Given:    Additional Medicare Important Message give by:     If discussed at [place] of Stay Meetings, dates discussed:    Additional Comments: CM spoke with pt regarding d/c planning . PT recommendations: HHPT. Information shared with pt and pt agreed to HHPT with [facility] providing the services after choice was given to the pt per CM. CM made referral tentative MD's order  for HHPT  With [phone]).  CM informed pt to have family  bring portable oxygen tank from home for transportation to home @ d/c. CM to f/u with disposition needs   Sharin Mons, [place] [phone] 10/11/2015, 10:56 AM

## 2015-10-12 DIAGNOSIS — R197 Diarrhea, unspecified: Secondary | ICD-10-CM | POA: Diagnosis not present

## 2015-10-12 DIAGNOSIS — R531 Weakness: Secondary | ICD-10-CM | POA: Diagnosis not present

## 2015-10-12 DIAGNOSIS — C3490 Malignant neoplasm of unspecified part of unspecified bronchus or lung: Secondary | ICD-10-CM | POA: Diagnosis not present

## 2015-10-12 DIAGNOSIS — J449 Chronic obstructive pulmonary disease, unspecified: Secondary | ICD-10-CM | POA: Diagnosis not present

## 2015-10-12 DIAGNOSIS — R42 Dizziness and giddiness: Secondary | ICD-10-CM | POA: Diagnosis not present

## 2015-10-12 DIAGNOSIS — I10 Essential (primary) hypertension: Secondary | ICD-10-CM | POA: Diagnosis not present

## 2015-10-14 ENCOUNTER — Telehealth: Payer: Self-pay | Admitting: Family Medicine

## 2015-10-14 DIAGNOSIS — J449 Chronic obstructive pulmonary disease, unspecified: Secondary | ICD-10-CM | POA: Diagnosis not present

## 2015-10-14 DIAGNOSIS — C3490 Malignant neoplasm of unspecified part of unspecified bronchus or lung: Secondary | ICD-10-CM | POA: Diagnosis not present

## 2015-10-14 DIAGNOSIS — R42 Dizziness and giddiness: Secondary | ICD-10-CM | POA: Diagnosis not present

## 2015-10-14 DIAGNOSIS — I10 Essential (primary) hypertension: Secondary | ICD-10-CM | POA: Diagnosis not present

## 2015-10-14 DIAGNOSIS — R531 Weakness: Secondary | ICD-10-CM | POA: Diagnosis not present

## 2015-10-14 DIAGNOSIS — R197 Diarrhea, unspecified: Secondary | ICD-10-CM | POA: Diagnosis not present

## 2015-10-14 NOTE — Telephone Encounter (Signed)
ok 

## 2015-10-14 NOTE — Telephone Encounter (Signed)
Pt was recently released from hospital and home health nurse is wanting an ok to keep seeing her for 60 days once a week for 9 weeks.

## 2015-10-14 NOTE — Telephone Encounter (Signed)
Order given for home health

## 2015-10-15 DIAGNOSIS — R531 Weakness: Secondary | ICD-10-CM | POA: Diagnosis not present

## 2015-10-15 DIAGNOSIS — I10 Essential (primary) hypertension: Secondary | ICD-10-CM | POA: Diagnosis not present

## 2015-10-15 DIAGNOSIS — R42 Dizziness and giddiness: Secondary | ICD-10-CM | POA: Diagnosis not present

## 2015-10-15 DIAGNOSIS — C3490 Malignant neoplasm of unspecified part of unspecified bronchus or lung: Secondary | ICD-10-CM | POA: Diagnosis not present

## 2015-10-15 DIAGNOSIS — R197 Diarrhea, unspecified: Secondary | ICD-10-CM | POA: Diagnosis not present

## 2015-10-15 DIAGNOSIS — J449 Chronic obstructive pulmonary disease, unspecified: Secondary | ICD-10-CM | POA: Diagnosis not present

## 2015-10-17 DIAGNOSIS — C3432 Malignant neoplasm of lower lobe, left bronchus or lung: Secondary | ICD-10-CM | POA: Diagnosis not present

## 2015-10-17 DIAGNOSIS — J449 Chronic obstructive pulmonary disease, unspecified: Secondary | ICD-10-CM | POA: Diagnosis not present

## 2015-10-17 DIAGNOSIS — F0280 Dementia in other diseases classified elsewhere without behavioral disturbance: Secondary | ICD-10-CM | POA: Diagnosis not present

## 2015-10-17 DIAGNOSIS — R531 Weakness: Secondary | ICD-10-CM | POA: Diagnosis not present

## 2015-10-17 DIAGNOSIS — Z9981 Dependence on supplemental oxygen: Secondary | ICD-10-CM | POA: Diagnosis not present

## 2015-10-17 DIAGNOSIS — I739 Peripheral vascular disease, unspecified: Secondary | ICD-10-CM | POA: Diagnosis not present

## 2015-10-17 DIAGNOSIS — E440 Moderate protein-calorie malnutrition: Secondary | ICD-10-CM | POA: Diagnosis not present

## 2015-10-17 DIAGNOSIS — E785 Hyperlipidemia, unspecified: Secondary | ICD-10-CM | POA: Diagnosis not present

## 2015-10-17 DIAGNOSIS — K819 Cholecystitis, unspecified: Secondary | ICD-10-CM | POA: Diagnosis not present

## 2015-10-17 DIAGNOSIS — E039 Hypothyroidism, unspecified: Secondary | ICD-10-CM | POA: Diagnosis not present

## 2015-10-17 DIAGNOSIS — R42 Dizziness and giddiness: Secondary | ICD-10-CM | POA: Diagnosis not present

## 2015-10-17 DIAGNOSIS — I10 Essential (primary) hypertension: Secondary | ICD-10-CM | POA: Diagnosis not present

## 2015-10-18 DIAGNOSIS — R531 Weakness: Secondary | ICD-10-CM | POA: Diagnosis not present

## 2015-10-18 DIAGNOSIS — K819 Cholecystitis, unspecified: Secondary | ICD-10-CM | POA: Diagnosis not present

## 2015-10-18 DIAGNOSIS — E440 Moderate protein-calorie malnutrition: Secondary | ICD-10-CM | POA: Diagnosis not present

## 2015-10-18 DIAGNOSIS — C3432 Malignant neoplasm of lower lobe, left bronchus or lung: Secondary | ICD-10-CM | POA: Diagnosis not present

## 2015-10-18 DIAGNOSIS — J449 Chronic obstructive pulmonary disease, unspecified: Secondary | ICD-10-CM | POA: Diagnosis not present

## 2015-10-18 DIAGNOSIS — F0280 Dementia in other diseases classified elsewhere without behavioral disturbance: Secondary | ICD-10-CM | POA: Diagnosis not present

## 2015-10-20 DIAGNOSIS — R531 Weakness: Secondary | ICD-10-CM | POA: Diagnosis not present

## 2015-10-20 DIAGNOSIS — E440 Moderate protein-calorie malnutrition: Secondary | ICD-10-CM | POA: Diagnosis not present

## 2015-10-20 DIAGNOSIS — F0280 Dementia in other diseases classified elsewhere without behavioral disturbance: Secondary | ICD-10-CM | POA: Diagnosis not present

## 2015-10-20 DIAGNOSIS — C3432 Malignant neoplasm of lower lobe, left bronchus or lung: Secondary | ICD-10-CM | POA: Diagnosis not present

## 2015-10-20 DIAGNOSIS — K819 Cholecystitis, unspecified: Secondary | ICD-10-CM | POA: Diagnosis not present

## 2015-10-20 DIAGNOSIS — J449 Chronic obstructive pulmonary disease, unspecified: Secondary | ICD-10-CM | POA: Diagnosis not present

## 2015-10-21 ENCOUNTER — Other Ambulatory Visit: Payer: Self-pay | Admitting: Family Medicine

## 2015-10-22 DIAGNOSIS — R531 Weakness: Secondary | ICD-10-CM | POA: Diagnosis not present

## 2015-10-22 DIAGNOSIS — J449 Chronic obstructive pulmonary disease, unspecified: Secondary | ICD-10-CM | POA: Diagnosis not present

## 2015-10-22 DIAGNOSIS — K819 Cholecystitis, unspecified: Secondary | ICD-10-CM | POA: Diagnosis not present

## 2015-10-22 DIAGNOSIS — C3432 Malignant neoplasm of lower lobe, left bronchus or lung: Secondary | ICD-10-CM | POA: Diagnosis not present

## 2015-10-22 DIAGNOSIS — F0280 Dementia in other diseases classified elsewhere without behavioral disturbance: Secondary | ICD-10-CM | POA: Diagnosis not present

## 2015-10-22 DIAGNOSIS — E440 Moderate protein-calorie malnutrition: Secondary | ICD-10-CM | POA: Diagnosis not present

## 2015-10-26 ENCOUNTER — Encounter: Payer: Self-pay | Admitting: Family Medicine

## 2015-10-26 ENCOUNTER — Ambulatory Visit (INDEPENDENT_AMBULATORY_CARE_PROVIDER_SITE_OTHER): Payer: Medicare Other | Admitting: Family Medicine

## 2015-10-26 VITALS — BP 142/88 | Ht 67.0 in

## 2015-10-26 DIAGNOSIS — F0391 Unspecified dementia with behavioral disturbance: Secondary | ICD-10-CM | POA: Diagnosis not present

## 2015-10-26 DIAGNOSIS — E785 Hyperlipidemia, unspecified: Secondary | ICD-10-CM

## 2015-10-26 DIAGNOSIS — C3432 Malignant neoplasm of lower lobe, left bronchus or lung: Secondary | ICD-10-CM | POA: Diagnosis not present

## 2015-10-26 DIAGNOSIS — J449 Chronic obstructive pulmonary disease, unspecified: Secondary | ICD-10-CM

## 2015-10-26 DIAGNOSIS — R531 Weakness: Secondary | ICD-10-CM | POA: Diagnosis not present

## 2015-10-26 DIAGNOSIS — F0280 Dementia in other diseases classified elsewhere without behavioral disturbance: Secondary | ICD-10-CM | POA: Diagnosis not present

## 2015-10-26 DIAGNOSIS — I10 Essential (primary) hypertension: Secondary | ICD-10-CM | POA: Diagnosis not present

## 2015-10-26 DIAGNOSIS — E440 Moderate protein-calorie malnutrition: Secondary | ICD-10-CM | POA: Diagnosis not present

## 2015-10-26 DIAGNOSIS — G4700 Insomnia, unspecified: Secondary | ICD-10-CM | POA: Diagnosis not present

## 2015-10-26 DIAGNOSIS — K819 Cholecystitis, unspecified: Secondary | ICD-10-CM | POA: Diagnosis not present

## 2015-10-26 DIAGNOSIS — F03918 Unspecified dementia, unspecified severity, with other behavioral disturbance: Secondary | ICD-10-CM

## 2015-10-26 NOTE — Progress Notes (Signed)
   Subjective:    Patient ID: [name], female    DOB: Oct 19, 1934, 81 y.o.   MRN: [id number]  HPI Patient arrives with for hospital follow up for gall bladder issues.   Patient states she is feeling better but very tired. Patient underwent a gallbladder excision. Handling it reasonably well. Hospital notes and goes into detail  Ongoing challenges with lung cancer treatment. Due for a another chemotherapy session soon.  Compliant blood pressure medicine no obvious trouble with it. Watching diet has cut salt intake down   Review of Systems Positive fatigue some weight loss some diminished energy    Objective:   Physical Exam  Alert vitals stable chronically ill-appearing slightly depressed affect lungs no wheezes or crackles no tachypnea heart rare rhythm scar from gallbladder excision healing well ankles no significant edema      Assessment & Plan:  Impression status post cholecystectomy #2 substantial fatigue likely related mostly to #1 #3 lung cancer discuss #4 hypertension decent control #4 chronic anxiety ongoing challenge but stable #5 dementia clinically stable current meds plan diet exercise discussed medications refilled. Recheck in several months nutrition concerns discussed also WSL

## 2015-10-28 DIAGNOSIS — C3432 Malignant neoplasm of lower lobe, left bronchus or lung: Secondary | ICD-10-CM | POA: Diagnosis not present

## 2015-10-28 DIAGNOSIS — J449 Chronic obstructive pulmonary disease, unspecified: Secondary | ICD-10-CM | POA: Diagnosis not present

## 2015-10-28 DIAGNOSIS — R531 Weakness: Secondary | ICD-10-CM | POA: Diagnosis not present

## 2015-10-28 DIAGNOSIS — E440 Moderate protein-calorie malnutrition: Secondary | ICD-10-CM | POA: Diagnosis not present

## 2015-10-28 DIAGNOSIS — K819 Cholecystitis, unspecified: Secondary | ICD-10-CM | POA: Diagnosis not present

## 2015-10-28 DIAGNOSIS — F0280 Dementia in other diseases classified elsewhere without behavioral disturbance: Secondary | ICD-10-CM | POA: Diagnosis not present

## 2015-10-29 DIAGNOSIS — K819 Cholecystitis, unspecified: Secondary | ICD-10-CM | POA: Diagnosis not present

## 2015-10-29 DIAGNOSIS — R531 Weakness: Secondary | ICD-10-CM | POA: Diagnosis not present

## 2015-10-29 DIAGNOSIS — E440 Moderate protein-calorie malnutrition: Secondary | ICD-10-CM | POA: Diagnosis not present

## 2015-10-29 DIAGNOSIS — J449 Chronic obstructive pulmonary disease, unspecified: Secondary | ICD-10-CM | POA: Diagnosis not present

## 2015-10-29 DIAGNOSIS — F0280 Dementia in other diseases classified elsewhere without behavioral disturbance: Secondary | ICD-10-CM | POA: Diagnosis not present

## 2015-10-29 DIAGNOSIS — C3432 Malignant neoplasm of lower lobe, left bronchus or lung: Secondary | ICD-10-CM | POA: Diagnosis not present

## 2015-11-02 NOTE — Progress Notes (Signed)
Mickie Hillier, MD [place] [address]  Squamous cell carcinoma of left lung [facility])  CURRENT THERAPY: Surveillance per NCCN guidelines  INTERVAL HISTORY: [name] 81 y.o. 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     Squamous cell carcinoma of left lung ([place])   06/30/2013 Imaging CT of chest demonstrates 4 cm left lower lobe lesion with other concerning findings for malignancy   07/09/2013 Imaging PET- 4.1 cm left lower oobe lesion with left hilar and bilateral mediastinal lymphadeopathy.  Hepatic lesion not confidently identified.  1.8 cm nodule in left upper lobe, cannot exclude low grade tumor.   07/16/2013 Initial Diagnosis Squamous cell carcinoma of left lung via CT-guided biopsy by IR   07/29/2013 Surgery IR placement of port-a-cath in preparation for concomittant chemoradiation    08/08/2013 - 09/12/2013 Chemotherapy Paclitaxel/Carboplatin weekly x 6   08/08/2013 - 09/19/2013 Radiation Therapy    10/14/2013 Imaging CT CAP- 3.2 cm posterior left lower lobe cavitary mass, corresponding to known primary bronchogenic neoplasm, improved. Small mediastinal nodes measuring up to 8 mm. Two hypodense hepatic lesions measuring up to 12 mm, poorly evaluated.   10/24/2013 Imaging MRI abd- Two liver lesions in the superior right hepatic lobe and left hepatic lobe remain indeterminate technically, but favor small benign hemangiomas over metastatic lesions.   02/02/2014 Imaging CT CAP- Decreasing size of the left lower lobe mass since prior study. 4 mm right lower lobe subpleural nodule, not seen on prior study.  Slight enlargement of hypodensities within the liver concerning for  metastases.   05/26/2014 Imaging CT CAP- Continued evolution of postradiation treatment related changes in the left lung without definite signs of residual or recurrent disease, and no definite evidence of metastatic disease in the chest, abdomen or pelvis on today's examination   07/16/2014 - 07/17/2014 Hospital Admission Acute on chronic renal failure with orthostatic hypotension.  Negative CT of head.     07/16/2014 Imaging CT head wo contrast- No evidence of intracranial metastatic disease noted on the present unenhanced head CT.   08/27/2014 Remission CT CAP- Stable appearance of the chest without definite signs of residual or recurrent disease and no evidence for metastatic disease in the chest, abdomen or pelvis.   04/13/2015 Imaging CT chest with stable L sided paramediastinal radiation fibrosis without definite findings for recurrent tumor, no pulm metastatic disease, stable to slightly larger segment 8 liver lesion, segment 4A lesion not seen, benign adenoma   10/04/2015 Imaging CT CAP- Enlarging intermediate density segment 4 liver lesion. Metastatic disease cannot be excluded.    I personally reviewed and went over laboratory results with the patient.  The results are noted within this dictation.  I personally reviewed and went over radiographic studies with the patient.  The results are noted within this dictation.  CT imaging on 10/04/2015 demonstrates an enlarging hepatic lesion.  Chart reviewed.  Recent hospitalization for acute cholecystitis from 4/11- 10/11/2015 resulting in IR percutaneous drain placement.  She was deemed a poor surgical candidate.  Surgery will follow her along and if conservative management.  She has follow-up with her Gen Surg next Friday.  We discussed her abnormal imaging finding that is concerning for malignancy, likely metastatic to liver from her previous NSCLC.  However, we cannot definitively say that she has malignancy without better imaging, namely an MRI w and  wo contrast per liver protocol and biopsy.  Her CT imaging was performed without IV contrast due to her renal function.  Therefore, imaging is not optimal.    Realistically, she is not a good candidate for systemic chemotherapy given her co-morbidities, performance status, and difficulties with primary treatment in 2015.   We discussed options moving forward and she will discuss this information with family.    Review of Systems - General ROS: negative for - chills, fever or night sweats Psychological ROS: negative for - hostility or obsessive thoughts Ophthalmic ROS: negative for - double vision ENT ROS: negative for - hearing change, nasal congestion, nasal discharge or visual changes Hematological and Lymphatic ROS: negative for - bleeding problems, blood clots, bruising, jaundice or swollen lymph nodes Respiratory ROS: positive for - shortness of breath Cardiovascular ROS: no chest pain or dyspnea on exertion Gastrointestinal ROS: no abdominal pain, change in bowel habits, or black or bloody stools Genito-Urinary ROS: no dysuria, trouble voiding, or hematuria Neurological ROS: no TIA or stroke symptoms  Past Medical History  Diagnosis Date  . Hypertension   . Hypothyroidism   . Hyperlipidemia   . Insomnia   . GERD (gastroesophageal reflux disease)     chronic gastritis  . Adrenal adenoma   . Chronic diarrhea   . Fatty liver   . Arthritis     knee  . QT prolongation     syncope  . Coronary artery disease   . Impaired fasting glucose   . Renal insufficiency     low protien diet  . Tobacco use     1/2 ppd, approx 25-50 pack years (as of 08/2012)  . Family history of heart disease   . IBS (irritable bowel syndrome)   . Peripheral arterial disease ([place])     sstatus post  infrarenal abdominal aortic tube graft placed by Dr. Victorino Dike February 2008  . Cancer ([place])     Lung  . Squamous cell carcinoma of left lung ([place]) 07/29/2013  . HCAP (healthcare-associated pneumonia)      10/2014  . Acute respiratory failure ([place])     10/2014  . COPD (chronic obstructive pulmonary disease) ([place])     moderat  . Pleural effusion     Past Surgical History  Procedure Laterality Date  . Abdominal hysterectomy    . Thyroidectomy, partial    . Colonoscopy  5/04    normal  . Abdominal aortic aneurysm repair  07/2006    D. J.D. Kellie Simmering  . Cataract extraction Bilateral 2010  . Cardiac catheterization  02/2010    mod CAD in L-dominant system with normal LV function  . Colon resection  2005    1/2 colon removed  . Transthoracic echocardiogram  02/2010    EF 55-60%; mild conc LVH, normal systolic function; mildly calcified AV annulus  . Colonoscopy with esophagogastroduodenoscopy (egd) N/A 03/19/2013    Procedure: COLONOSCOPY WITH ESOPHAGOGASTRODUODENOSCOPY (EGD);  Surgeon: Rogene Houston, MD;  Location: [facility];  Service: Endoscopy;  Laterality: N/A;  145  . Dg biopsy lung Left Jan 2015    Social History   Social History  . Marital Status: Divorced    Spouse Name: N/A  . Number of Children: 2  . Years of Education: N/A   Occupational History  .     Social History Main Topics  . Smoking status: Former Smoker -- 0.50  packs/day for 59 years    Types: Cigarettes  . Smokeless tobacco: Never Used     Comment: smoking since age [age]  . Alcohol Use: No  . Drug Use: No  . Sexual Activity: Not Asked   Other Topics Concern  . None   Social History Narrative    PHYSICAL EXAMINATION  ECOG PERFORMANCE STATUS: 2 - Symptomatic, <50% confined to bed  Filed Vitals:   11/03/15 1015  BP: 131/63  Pulse: 94  Temp: 98.8 F (37.1 C)  Resp: 18    GENERAL:alert, no distress, comfortable, cooperative, smiling, chronically ill appearing, in a wheelchair, [place] in place for O2 delivery, accompanied by her granddaughter. SKIN: skin color, texture, turgor are normal HEAD: Normocephalic, No masses, lesions, tenderness or abnormalities EYES: normal, Conjunctiva are pink and  non-injected EARS: External ears normal OROPHARYNX:lips, buccal mucosa, and tongue normal and mucous membranes are moist  NECK: supple, trachea midline LYMPH:  not examined BREAST:not examined LUNGS: clear to auscultation and percussion HEART: regular rate & rhythm, no murmurs, no gallops, S1 normal and S2 normal ABDOMEN:abdomen soft, non-tender and normal bowel sounds, percutaneous drain in place, cleanly dressed, with bile in bag. BACK: Back symmetric, no curvature., No CVA tenderness EXTREMITIES:less then 2 second capillary refill, no joint deformities, effusion, or inflammation, no skin discoloration, no cyanosis  NEURO: alert & oriented x 3 with fluent speech, no focal motor/sensory deficits, in a wheelchair   LABORATORY DATA: CBC    Component Value Date/Time   WBC 3.8* 10/09/2015 0522   RBC 2.70* 10/09/2015 0522   HGB 8.5* 10/09/2015 0522   HCT 27.2* 10/09/2015 0522   PLT 259 10/09/2015 0522   MCV 100.7* 10/09/2015 0522   MCH 31.5 10/09/2015 0522   MCHC 31.3 10/09/2015 0522   RDW 13.9 10/09/2015 0522   LYMPHSABS 0.8 10/05/2015 2145   MONOABS 0.5 10/05/2015 2145   EOSABS 0.2 10/05/2015 2145   BASOSABS 0.0 10/05/2015 2145      Chemistry      Component Value Date/Time   NA 141 10/10/2015 0520   NA 142 09/02/2015 0927   K 3.4* 10/10/2015 0520   CL 109 10/10/2015 0520   CO2 23 10/10/2015 0520   BUN 9 10/10/2015 0520   BUN 17 09/02/2015 0927   CREATININE 1.58* 10/10/2015 0520   CREATININE 2.07* 06/24/2014 1239      Component Value Date/Time   CALCIUM 9.3 10/10/2015 0520   ALKPHOS 24* 10/10/2015 0520   AST 21 10/10/2015 0520   ALT 9* 10/10/2015 0520   BILITOT 0.6 10/10/2015 0520   BILITOT 0.4 09/02/2015 0927        PENDING LABS:   RADIOGRAPHIC STUDIES:  Ct Abdomen Pelvis Wo Contrast  10/06/2015  ADDENDUM REPORT: 10/06/2015 02:13 ADDENDUM: Prior reports have been obtained. The left hepatic lobe liver lesion is enlarged from 08/27/2014, as reported on the  10/04/2015 CT. The right hepatic lobe dome lesion is stable. Nonemergent abdominal MRI without/with contrast would be helpful for further evaluation of the enlarging left hepatic lobe lesion, if clinically warranted. The left adrenal nodule is unchanged from 08/27/2014. Electronically Signed   By: Andreas Newport M.D.   On: 10/06/2015 02:13  10/06/2015  CLINICAL DATA:  Dull right-sided abdominal pain, onset this morning. Nausea and chills. EXAM: CT ABDOMEN AND PELVIS WITHOUT CONTRAST TECHNIQUE: Multidetector CT imaging of the abdomen and pelvis was performed following the standard protocol without IV contrast. COMPARISON:  10/04/2015 FINDINGS: The gallbladder is distended, and there is an 11 mm  calculus in the gallbladder neck. There is mild stranding around the gallbladder and a suggestion of gallbladder mural thickening. The findings may represent acute cholecystitis. No extrahepatic bile duct dilatation. There is mild right hydronephrosis without visible ureteral calculus. Left collecting system is mildly fold but not frankly hydronephrotic. Otherwise unremarkable appearances of the kidneys. No urinary calculi are evident. 2.7 cm low-attenuation liver lesion in the left lobe anteriorly, as well as a 1.7 cm low-attenuation lesion in the right hepatic lobe superiorly. These are indeterminate. There are unremarkable unenhanced appearances of the spleen and pancreas. There is a 2.8 cm left adrenal nodule. Extensive prior bowel surgery. No evidence of bowel obstruction. No extraluminal air. Abdominal aortic graft, appearing intact on this unenhanced scan. No significant skeletal lesion. Unchanged consolidation in the left lung base medially. IMPRESSION: 1. New gallbladder distention, mural thickening and adjacent stranding. Cholelithiasis. This may represent acute cholecystitis. Recommend right upper quadrant sonography to evaluate. 2. Mild right hydronephrosis and mild fullness of the left renal collecting system.  No ureteral calculi are evident. 3. Low-attenuation liver lesions, not characterized. Left adrenal nodule, not characterized. Prior studies not currently available to assess stability. Electronically Signed: By: Andreas Newport M.D. On: 10/06/2015 00:36   US Abdomen Complete  10/06/2015  CLINICAL DATA:  Lung cancer. COPD. Findings suggestive of acute cholecystitis on CT study performed 1 day prior. EXAM: ABDOMEN ULTRASOUND COMPLETE COMPARISON:  10/05/2015 CT abdomen/ pelvis. 08/20/2006 abdominal sonogram. FINDINGS: Gallbladder: Nonmobile 1.4 cm calcified shadowing gallstone in the gallbladder neck. Mildly distended gallbladder. Mild to moderate diffuse gallbladder wall thickening. Trace pericholecystic fluid. No sonographic Murphy sign. Common bile duct: Diameter: 5 mm Liver: Hypodense 2.2 x 2.1 x 2.2 cm liver mass in the anterior left liver lobe. No additional liver masses are detected. Background liver parenchymal echogenicity and echotexture are within normal limits. IVC: No abnormality visualized. Pancreas: Visualized portion unremarkable. Spleen: Size and appearance within normal limits. Right Kidney: Length: 9.8 cm. Mildly echogenic right kidney. No right hydronephrosis. No right renal mass. Left Kidney: Length: 10.2 cm. Mildly echogenic left kidney. No left hydronephrosis. No left renal mass. Abdominal aorta: No aneurysm visualized. Other findings: None. IMPRESSION: 1. Nonmobile gallstone in the gallbladder neck with mild gallbladder distention, diffuse gallbladder wall thickening, trace pericholecystic fluid and no sonographic Murphy's sign. Sonographic findings are suspicious but not definitive for acute cholecystitis given the absence of sonographic Murphy sign. Consider correlation with hepatobiliary scintigraphy as clinically warranted. 2. No biliary ductal dilatation. 3. Indeterminate hypodense 2.2 cm left liver lobe mass, liver metastasis not excluded. 4. No hydronephrosis. Echogenic kidneys, a  nonspecific finding indicating renal parenchymal disease of uncertain chronicity. Electronically Signed   By: Ilona Sorrel M.D.   On: 10/06/2015 10:06   Nm Hepato W/eject Fract  10/06/2015  CLINICAL DATA:  81 year old female with nonmobile gallstone in the neck of the gallbladder with ultrasound findings suspicious for acute cholecystitis discovered during imaging for right abdominal pain. Initial encounter. EXAM: NUCLEAR MEDICINE HEPATOBILIARY IMAGING TECHNIQUE: Sequential images of the abdomen were obtained out to 60 minutes following intravenous administration of radiopharmaceutical. RADIOPHARMACEUTICALS:  5.1 mCi Tc-41m Choletec IV COMPARISON:  Ultrasound 0647 hours today. CT Abdomen and Pelvis 10/05/2015 FINDINGS: Prompt radiotracer uptake by the liver and clearance of the blood pool. Prompt CBD and small bowel activity by 10 minutes. Over the first hour of the study, no gallbladder activity occurred. Subsequently, the study was augmented with morphine. 30 minutes of additional imaging occurred, with no gallbladder activity. IMPRESSION: Absent gallbladder  activity despite augmentation of this study with morphine, further raising suspicion of Acute Cholecystitis. CBD is patent. Electronically Signed   By: Genevie Cammy M.D.   On: 10/06/2015 14:21   Ct Image Guided Drainage By Percutaneous Catheter  10/07/2015  INDICATION: 81 year old with acute cholecystitis. Patient is not a good surgical candidate and request for percutaneous cholecystostomy tube placement EXAM: CT GUIDED PLACEMENT OF CHOLECYSTOSTOMY TUBE MEDICATIONS: The patient is currently admitted to the hospital and receiving intravenous antibiotics. The antibiotics were administered within an appropriate time frame prior to the initiation of the procedure. ANESTHESIA/SEDATION: 0.5 mg IV Versed 50 mcg IV Fentanyl Moderate Sedation Time:  24 minutes The patient was continuously monitored during the procedure by the interventional radiology nurse under my  direct supervision. COMPLICATIONS: None immediate. TECHNIQUE: Informed written consent was obtained from the patient after a thorough discussion of the procedural risks, benefits and alternatives. All questions were addressed. A timeout was performed prior to the initiation of the procedure. PROCEDURE: Patient was placed supine on the CT scanner. Images through the abdomen were obtained. The distended gallbladder was targeted. The right side of the abdomen was prepped with chlorhexidine and a sterile field was created. Skin was anesthetized with 1% lidocaine. Using CT guidance, an 18 gauge trocar needle was directed into the distended gallbladder. Stiff Amplatz wire was advanced into the gallbladder. The tract was dilated to accommodate a 10 [place] drain. 10 French drain was advanced over the wire and reconstituted the gallbladder. Greater than 90 mL of cloudy yellow fluid was removed. Catheter was sutured to skin and attached to gravity bag. FINDINGS: Distended gallbladder with surrounding fluid. Drain placed within the gallbladder. Decompression of the gallbladder following drainage. Cloudy yellow fluid was removed and sent for culture. IMPRESSION: CT-guided percutaneous cholecystostomy tube placement. Electronically Signed   By: Markus Daft M.D.   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   Oncology history is updated.  CT imaging in April 2017 demonstrates an enlarging hepatic lesion.  We had a frank discussion regarding this finding and to determine the most appropriate surveillance/intervention for this new lesion that is currently  suspicious for recurrence given her oncology history.    1. MRI abdomen w and wo contrast per hepatic protocol, followed by biopsy if indicated.  2. Surveillance and palliative intervention  We discussed the above information in detail.  The patient wishes to review the information with her family before coming to any conclusion.  I do not think it would be unreasonable to pursue an MRI to optimize imaging studies given her renal function and then pursue biopsy if the patient wishes.  She is educated that she is not a good candidate for treatment, depending on pathology results.  The patient agrees with this statement. As a result, if the patient decides that she is not interested in pursuing treatment, it would not be unreasonable to forgo further imaging and monitor the patient clinically.    Return in 4 weeks for follow-up.  We can certainly move this appointment as needed.  Her major issue currently is her cholecystitis.  She has an appointment next Friday with her Surgeon.    ORDERS PLACED FOR  THIS ENCOUNTER: No orders of the defined types were placed in this encounter.    MEDICATIONS PRESCRIBED THIS ENCOUNTER: No orders of the defined types were placed in this encounter.    THERAPY PLAN:  Patient will consider her options after discussing with family.  All questions were answered. The patient knows to call the clinic with any problems, questions or concerns. We can certainly see the patient much sooner if necessary.  Patient and plan discussed with Dr. Ancil Linsey and she is in agreement with the aforementioned.   This note is electronically signed by: Doy Mince 11/03/2015 1:33 PM

## 2015-11-02 NOTE — Assessment & Plan Note (Addendum)
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   Oncology history is updated.  CT imaging in April 2017 demonstrates an enlarging hepatic lesion.  We had a frank discussion regarding this finding and to determine the most appropriate surveillance/intervention for this new lesion that is currently suspicious for recurrence given her oncology history.    1. MRI abdomen w and wo contrast per hepatic protocol, followed by biopsy if indicated.  2. Surveillance and palliative intervention  We discussed the above information in detail.  The patient wishes to review the information with her family before coming to any conclusion.  I do not think it would be unreasonable to pursue an MRI to optimize imaging studies given her renal function and then pursue biopsy if the patient wishes.  She is educated that she is not a good candidate for treatment, depending on pathology results.  The patient agrees with this statement. As a result, if the patient decides that she is not interested in pursuing treatment, it would not be unreasonable to forgo further imaging and monitor the patient clinically.    Return in 4 weeks for follow-up.  We can certainly move this appointment as needed.  Her major issue currently is her cholecystitis.  She has an appointment next Friday with her Surgeon.

## 2015-11-03 ENCOUNTER — Encounter (HOSPITAL_COMMUNITY): Payer: Self-pay | Admitting: Oncology

## 2015-11-03 ENCOUNTER — Encounter (HOSPITAL_BASED_OUTPATIENT_CLINIC_OR_DEPARTMENT_OTHER): Payer: Medicare Other | Admitting: Oncology

## 2015-11-03 ENCOUNTER — Encounter (HOSPITAL_COMMUNITY): Payer: Medicare Other | Attending: Hematology and Oncology

## 2015-11-03 VITALS — BP 131/63 | HR 94 | Temp 98.8°F | Resp 18 | Wt 139.0 lb

## 2015-11-03 DIAGNOSIS — Z452 Encounter for adjustment and management of vascular access device: Secondary | ICD-10-CM

## 2015-11-03 DIAGNOSIS — Z85118 Personal history of other malignant neoplasm of bronchus and lung: Secondary | ICD-10-CM

## 2015-11-03 DIAGNOSIS — C3432 Malignant neoplasm of lower lobe, left bronchus or lung: Secondary | ICD-10-CM

## 2015-11-03 DIAGNOSIS — C3492 Malignant neoplasm of unspecified part of left bronchus or lung: Secondary | ICD-10-CM

## 2015-11-03 MED ORDER — HEPARIN SOD (PORK) LOCK FLUSH 100 UNIT/ML IV SOLN
INTRAVENOUS | Status: AC
  Filled 2015-11-03: qty 5

## 2015-11-03 MED ORDER — SODIUM CHLORIDE 0.9% FLUSH
20.0000 mL | INTRAVENOUS | Status: DC | PRN
  Administered 2015-11-03: 20 mL via INTRAVENOUS
  Filled 2015-11-03: qty 20

## 2015-11-03 MED ORDER — HEPARIN SOD (PORK) LOCK FLUSH 100 UNIT/ML IV SOLN
500.0000 [IU] | Freq: Once | INTRAVENOUS | Status: AC
  Administered 2015-11-03: 500 [IU] via INTRAVENOUS

## 2015-11-03 NOTE — Progress Notes (Signed)
[name] presented for Portacath access and flush. Proper placement of portacath confirmed by CXR. Portacath located right chest wall accessed with  H 20 needle. Good blood return present. Portacath flushed with 20ml NS and 500U/5ml Heparin and needle removed intact. Procedure without incident. Patient tolerated procedure well.   

## 2015-11-03 NOTE — Patient Instructions (Signed)
[facility] at [facility] Discharge Instructions  RECOMMENDATIONS MADE BY THE CONSULTANT AND ANY TEST RESULTS WILL BE SENT TO YOUR REFERRING PHYSICIAN.  Port flush today.    Thank you for choosing [facility] at [facility] to provide your oncology and hematology care.  To afford each patient quality time with our provider, please arrive at least 15 minutes before your scheduled appointment time.   Beginning January 23rd 2017 lab work for the Cancer Center will be done in the  Main lab at [facility] on 1st floor. If you have a lab appointment with the Cancer Center please come in thru the  Main Entrance and check in at the main information desk  You need to re-schedule your appointment should you arrive 10 or more minutes late.  We strive to give you quality time with our providers, and arriving late affects you and other patients whose appointments are after yours.  Also, if you no show three or more times for appointments you may be dismissed from the clinic at the providers discretion.     Again, thank you for choosing [facility].  Our hope is that these requests will decrease the amount of time that you wait before being seen by our physicians.       _____________________________________________________________  Should you have questions after your visit to [facility], please contact our office at [phone] between the hours of 8:30 a.m. and 4:30 p.m.  Voicemails left after 4:30 p.m. will not be returned until the following business day.  For prescription refill requests, have your pharmacy contact our office.         Resources For Cancer Patients and their Caregivers ? American Cancer Society: Can assist with transportation, wigs, general needs, runs Look Good Feel Better.        1-888-227-6333 ? Cancer Care: Provides financial assistance, online support groups, medication/co-pay assistance.   1-800-813-HOPE (4673) ? [facility] Assists [place] cancer patients and their families through emotional , educational and financial support.  336-427-4357 ? [place] DSS Where to apply for food stamps, Medicaid and utility assistance. 336-342-1394 ? [facility]: Transportation to medical appointments. 336-347-2287 ? Social Security Administration: May apply for disability if have a Stage IV cancer. 336-342-7796 1-800-772-1213 ? [place] Aging, Disability and Transit Services: Assists with nutrition, care and transit needs. 336-349-2343  Cancer Center Support Programs: @10RELATIVEDAYS@ > Cancer Support Group  2nd Tuesday of the month 1pm-2pm, Journey Room  > Creative Journey  3rd Tuesday of the month 1130am-1pm, Journey Room  > Look Good Feel Better  1st Wednesday of the month 10am-12 noon, Journey Room (Call American Cancer Society to register 1-800-395-5775)    

## 2015-11-03 NOTE — Patient Instructions (Signed)
[name] Discharge Instructions  RECOMMENDATIONS MADE BY THE CONSULTANT AND ANY TEST RESULTS WILL BE SENT TO YOUR REFERRING PHYSICIAN.  Exam and discussion today with Kirby Crigler, PA. Follow up with your surgeon as scheduled. Call the clinic after discussion with your family regarding your plan of care. Office visit in 4 weeks.   Thank you for choosing [place] at [facility] to provide your oncology and hematology care.  To afford each patient quality time with our provider, please arrive at least 15 minutes before your scheduled appointment time.   Beginning January 23rd 2017 lab work for the [facility] will be done in the  Main lab at [facility] on 1st floor. If you have a lab appointment with the [facility] please come in thru the  Main Entrance and check in at the main information desk  You need to re-schedule your appointment should you arrive 10 or more minutes late.  We strive to give you quality time with our providers, and arriving late affects you and other patients whose appointments are after yours.  Also, if you no show three or more times for appointments you may be dismissed from the clinic at the providers discretion.     Again, thank you for choosing [facility].  Our hope is that these requests will decrease the amount of time that you wait before being seen by our physicians.       _____________________________________________________________  Should you have questions after your visit to [facility], please contact our office at [phone] between the hours of 8:30 a.m. and 4:30 p.m.  Voicemails left after 4:30 p.m. will not be returned until the following business day.  For prescription refill requests, have your pharmacy contact our office.         Resources For Cancer Patients and their Caregivers ? American Cancer Society: Can assist with transportation, wigs,  general needs, runs Look Good Feel Better.        ([phone] [phone] ? Cancer Care: Provides financial assistance, online support groups, medication/co-pay assistance.  1-800-813-HOPE 614-062-2865) ? [place] Assists [place] cancer patients and their families through emotional , educational and financial support.  856 622 2393 ? [place] DSS Where to apply for food stamps, Medicaid and utility assistance. (878)465-0632 ? RCATS: Transportation to medical appointments. 337-377-9151 ? Social Security Administration: May apply for disability if have a Stage IV cancer. 205-132-2523 (601)435-8125 ? [employer], Disability and Transit Services: Assists with nutrition, care and transit needs. [place] Support Programs: '@10RELATIVEDAYS'$ @ > Cancer Support Group  2nd Tuesday of the month 1pm-2pm, Journey Room  > Creative Journey  3rd Tuesday of the month 1130am-1pm, Journey Room  > Look Good Feel Better  1st Wednesday of the month 10am-12 noon, Journey Room (Call [place] to register [phone])

## 2015-11-04 DIAGNOSIS — K819 Cholecystitis, unspecified: Secondary | ICD-10-CM | POA: Diagnosis not present

## 2015-11-04 DIAGNOSIS — J449 Chronic obstructive pulmonary disease, unspecified: Secondary | ICD-10-CM | POA: Diagnosis not present

## 2015-11-04 DIAGNOSIS — F0280 Dementia in other diseases classified elsewhere without behavioral disturbance: Secondary | ICD-10-CM | POA: Diagnosis not present

## 2015-11-04 DIAGNOSIS — R531 Weakness: Secondary | ICD-10-CM | POA: Diagnosis not present

## 2015-11-04 DIAGNOSIS — C3432 Malignant neoplasm of lower lobe, left bronchus or lung: Secondary | ICD-10-CM | POA: Diagnosis not present

## 2015-11-04 DIAGNOSIS — E440 Moderate protein-calorie malnutrition: Secondary | ICD-10-CM | POA: Diagnosis not present

## 2015-11-05 DIAGNOSIS — J449 Chronic obstructive pulmonary disease, unspecified: Secondary | ICD-10-CM | POA: Diagnosis not present

## 2015-11-05 DIAGNOSIS — K819 Cholecystitis, unspecified: Secondary | ICD-10-CM | POA: Diagnosis not present

## 2015-11-05 DIAGNOSIS — F0280 Dementia in other diseases classified elsewhere without behavioral disturbance: Secondary | ICD-10-CM | POA: Diagnosis not present

## 2015-11-05 DIAGNOSIS — E440 Moderate protein-calorie malnutrition: Secondary | ICD-10-CM | POA: Diagnosis not present

## 2015-11-05 DIAGNOSIS — C3432 Malignant neoplasm of lower lobe, left bronchus or lung: Secondary | ICD-10-CM | POA: Diagnosis not present

## 2015-11-05 DIAGNOSIS — R531 Weakness: Secondary | ICD-10-CM | POA: Diagnosis not present

## 2015-11-09 DIAGNOSIS — F0280 Dementia in other diseases classified elsewhere without behavioral disturbance: Secondary | ICD-10-CM | POA: Diagnosis not present

## 2015-11-09 DIAGNOSIS — K819 Cholecystitis, unspecified: Secondary | ICD-10-CM | POA: Diagnosis not present

## 2015-11-09 DIAGNOSIS — E440 Moderate protein-calorie malnutrition: Secondary | ICD-10-CM | POA: Diagnosis not present

## 2015-11-09 DIAGNOSIS — C3432 Malignant neoplasm of lower lobe, left bronchus or lung: Secondary | ICD-10-CM | POA: Diagnosis not present

## 2015-11-09 DIAGNOSIS — R531 Weakness: Secondary | ICD-10-CM | POA: Diagnosis not present

## 2015-11-09 DIAGNOSIS — J449 Chronic obstructive pulmonary disease, unspecified: Secondary | ICD-10-CM | POA: Diagnosis not present

## 2015-11-11 ENCOUNTER — Telehealth: Payer: Self-pay | Admitting: Family Medicine

## 2015-11-11 DIAGNOSIS — E440 Moderate protein-calorie malnutrition: Secondary | ICD-10-CM | POA: Diagnosis not present

## 2015-11-11 DIAGNOSIS — J449 Chronic obstructive pulmonary disease, unspecified: Secondary | ICD-10-CM | POA: Diagnosis not present

## 2015-11-11 DIAGNOSIS — C3432 Malignant neoplasm of lower lobe, left bronchus or lung: Secondary | ICD-10-CM | POA: Diagnosis not present

## 2015-11-11 DIAGNOSIS — K819 Cholecystitis, unspecified: Secondary | ICD-10-CM | POA: Diagnosis not present

## 2015-11-11 DIAGNOSIS — R531 Weakness: Secondary | ICD-10-CM | POA: Diagnosis not present

## 2015-11-11 DIAGNOSIS — F0280 Dementia in other diseases classified elsewhere without behavioral disturbance: Secondary | ICD-10-CM | POA: Diagnosis not present

## 2015-11-11 NOTE — Telephone Encounter (Signed)
[facility]  would like to continue an additional 3 wks of PT if you  Would give the verbal order to do so   Please call [phone] Amy

## 2015-11-11 NOTE — Telephone Encounter (Signed)
Called and spoke with Amy from [facility] and gave verbal orders per Dr.Steve Luking to continue additional 3 weeks of Physical therapy. Home health nurse verbalized understanding.

## 2015-11-11 NOTE — Telephone Encounter (Signed)
Let's do 

## 2015-11-12 DIAGNOSIS — R160 Hepatomegaly, not elsewhere classified: Secondary | ICD-10-CM | POA: Diagnosis not present

## 2015-11-12 DIAGNOSIS — Z434 Encounter for attention to other artificial openings of digestive tract: Secondary | ICD-10-CM | POA: Diagnosis not present

## 2015-11-12 DIAGNOSIS — C3492 Malignant neoplasm of unspecified part of left bronchus or lung: Secondary | ICD-10-CM | POA: Diagnosis not present

## 2015-11-12 DIAGNOSIS — K8010 Calculus of gallbladder with chronic cholecystitis without obstruction: Secondary | ICD-10-CM | POA: Diagnosis not present

## 2015-11-14 ENCOUNTER — Other Ambulatory Visit: Payer: Self-pay | Admitting: Family Medicine

## 2015-11-15 ENCOUNTER — Other Ambulatory Visit: Payer: Self-pay | Admitting: General Surgery

## 2015-11-15 DIAGNOSIS — R531 Weakness: Secondary | ICD-10-CM | POA: Diagnosis not present

## 2015-11-15 DIAGNOSIS — J449 Chronic obstructive pulmonary disease, unspecified: Secondary | ICD-10-CM | POA: Diagnosis not present

## 2015-11-15 DIAGNOSIS — C3432 Malignant neoplasm of lower lobe, left bronchus or lung: Secondary | ICD-10-CM | POA: Diagnosis not present

## 2015-11-15 DIAGNOSIS — K8010 Calculus of gallbladder with chronic cholecystitis without obstruction: Secondary | ICD-10-CM

## 2015-11-15 DIAGNOSIS — F0280 Dementia in other diseases classified elsewhere without behavioral disturbance: Secondary | ICD-10-CM | POA: Diagnosis not present

## 2015-11-15 DIAGNOSIS — K819 Cholecystitis, unspecified: Secondary | ICD-10-CM | POA: Diagnosis not present

## 2015-11-15 DIAGNOSIS — E440 Moderate protein-calorie malnutrition: Secondary | ICD-10-CM | POA: Diagnosis not present

## 2015-11-15 NOTE — Telephone Encounter (Signed)
Ok six mo worth 

## 2015-11-18 DIAGNOSIS — C3432 Malignant neoplasm of lower lobe, left bronchus or lung: Secondary | ICD-10-CM | POA: Diagnosis not present

## 2015-11-18 DIAGNOSIS — F0280 Dementia in other diseases classified elsewhere without behavioral disturbance: Secondary | ICD-10-CM | POA: Diagnosis not present

## 2015-11-18 DIAGNOSIS — K819 Cholecystitis, unspecified: Secondary | ICD-10-CM | POA: Diagnosis not present

## 2015-11-18 DIAGNOSIS — J449 Chronic obstructive pulmonary disease, unspecified: Secondary | ICD-10-CM | POA: Diagnosis not present

## 2015-11-18 DIAGNOSIS — R531 Weakness: Secondary | ICD-10-CM | POA: Diagnosis not present

## 2015-11-18 DIAGNOSIS — E440 Moderate protein-calorie malnutrition: Secondary | ICD-10-CM | POA: Diagnosis not present

## 2015-11-20 ENCOUNTER — Other Ambulatory Visit: Payer: Self-pay | Admitting: Family Medicine

## 2015-11-23 DIAGNOSIS — C3432 Malignant neoplasm of lower lobe, left bronchus or lung: Secondary | ICD-10-CM | POA: Diagnosis not present

## 2015-11-23 DIAGNOSIS — E440 Moderate protein-calorie malnutrition: Secondary | ICD-10-CM | POA: Diagnosis not present

## 2015-11-23 DIAGNOSIS — J449 Chronic obstructive pulmonary disease, unspecified: Secondary | ICD-10-CM | POA: Diagnosis not present

## 2015-11-23 DIAGNOSIS — K819 Cholecystitis, unspecified: Secondary | ICD-10-CM | POA: Diagnosis not present

## 2015-11-23 DIAGNOSIS — R531 Weakness: Secondary | ICD-10-CM | POA: Diagnosis not present

## 2015-11-23 DIAGNOSIS — F0280 Dementia in other diseases classified elsewhere without behavioral disturbance: Secondary | ICD-10-CM | POA: Diagnosis not present

## 2015-11-26 DIAGNOSIS — C3432 Malignant neoplasm of lower lobe, left bronchus or lung: Secondary | ICD-10-CM | POA: Diagnosis not present

## 2015-11-26 DIAGNOSIS — F0280 Dementia in other diseases classified elsewhere without behavioral disturbance: Secondary | ICD-10-CM | POA: Diagnosis not present

## 2015-11-26 DIAGNOSIS — K819 Cholecystitis, unspecified: Secondary | ICD-10-CM | POA: Diagnosis not present

## 2015-11-26 DIAGNOSIS — E440 Moderate protein-calorie malnutrition: Secondary | ICD-10-CM | POA: Diagnosis not present

## 2015-11-26 DIAGNOSIS — R531 Weakness: Secondary | ICD-10-CM | POA: Diagnosis not present

## 2015-11-26 DIAGNOSIS — J449 Chronic obstructive pulmonary disease, unspecified: Secondary | ICD-10-CM | POA: Diagnosis not present

## 2015-11-28 DIAGNOSIS — K819 Cholecystitis, unspecified: Secondary | ICD-10-CM | POA: Diagnosis not present

## 2015-11-28 DIAGNOSIS — R531 Weakness: Secondary | ICD-10-CM | POA: Diagnosis not present

## 2015-11-28 DIAGNOSIS — J449 Chronic obstructive pulmonary disease, unspecified: Secondary | ICD-10-CM | POA: Diagnosis not present

## 2015-11-28 DIAGNOSIS — E440 Moderate protein-calorie malnutrition: Secondary | ICD-10-CM | POA: Diagnosis not present

## 2015-11-28 DIAGNOSIS — C3432 Malignant neoplasm of lower lobe, left bronchus or lung: Secondary | ICD-10-CM | POA: Diagnosis not present

## 2015-11-28 DIAGNOSIS — F0280 Dementia in other diseases classified elsewhere without behavioral disturbance: Secondary | ICD-10-CM | POA: Diagnosis not present

## 2015-11-29 DIAGNOSIS — R531 Weakness: Secondary | ICD-10-CM | POA: Diagnosis not present

## 2015-11-29 DIAGNOSIS — E440 Moderate protein-calorie malnutrition: Secondary | ICD-10-CM | POA: Diagnosis not present

## 2015-11-29 DIAGNOSIS — C3432 Malignant neoplasm of lower lobe, left bronchus or lung: Secondary | ICD-10-CM | POA: Diagnosis not present

## 2015-11-29 DIAGNOSIS — F0280 Dementia in other diseases classified elsewhere without behavioral disturbance: Secondary | ICD-10-CM | POA: Diagnosis not present

## 2015-11-29 DIAGNOSIS — K819 Cholecystitis, unspecified: Secondary | ICD-10-CM | POA: Diagnosis not present

## 2015-11-29 DIAGNOSIS — J449 Chronic obstructive pulmonary disease, unspecified: Secondary | ICD-10-CM | POA: Diagnosis not present

## 2015-12-01 ENCOUNTER — Ambulatory Visit
Admission: RE | Admit: 2015-12-01 | Discharge: 2015-12-01 | Disposition: A | Discharge status: Home / Self Care | Payer: Medicare Other | Source: Ambulatory Visit | Attending: General Surgery | Admitting: General Surgery

## 2015-12-01 ENCOUNTER — Other Ambulatory Visit: Payer: Self-pay | Admitting: General Surgery

## 2015-12-01 DIAGNOSIS — K8010 Calculus of gallbladder with chronic cholecystitis without obstruction: Secondary | ICD-10-CM

## 2015-12-01 DIAGNOSIS — Z434 Encounter for attention to other artificial openings of digestive tract: Secondary | ICD-10-CM | POA: Diagnosis not present

## 2015-12-01 NOTE — Progress Notes (Signed)
Referring Physician(s): Wilson,Eric  Chief Complaint: The patient is seen in follow up today s/p CT-guided percutaneous cholecystostomy tube placement 10/07/2015  History of present illness:  Pt doing well No complaints Here today for cholecystostomy drain recheck    Past Medical History  Diagnosis Date  . Hypertension   . Hypothyroidism   . Hyperlipidemia   . Insomnia   . GERD (gastroesophageal reflux disease)     chronic gastritis  . Adrenal adenoma   . Chronic diarrhea   . Fatty liver   . Arthritis     knee  . QT prolongation     syncope  . Coronary artery disease   . Impaired fasting glucose   . Renal insufficiency     low protien diet  . Tobacco use     1/2 ppd, approx 25-50 pack years (as of 08/2012)  . Family history of heart disease   . IBS (irritable bowel syndrome)   . Peripheral arterial disease ([place])     sstatus post  infrarenal abdominal aortic tube graft placed by Dr. Victorino Dike February 2008  . Cancer ([place])     Lung  . Squamous cell carcinoma of left lung ([place]) 07/29/2013  . HCAP (healthcare-associated pneumonia)     10/2014  . Acute respiratory failure ([place])     10/2014  . COPD (chronic obstructive pulmonary disease) ([place])     moderat  . Pleural effusion     Past Surgical History  Procedure Laterality Date  . Abdominal hysterectomy    . Thyroidectomy, partial    . Colonoscopy  5/04    normal  . Abdominal aortic aneurysm repair  07/2006    D. J.D. Kellie Simmering  . Cataract extraction Bilateral 2010  . Cardiac catheterization  02/2010    mod CAD in L-dominant system with normal LV function  . Colon resection  2005    1/2 colon removed  . Transthoracic echocardiogram  02/2010    EF 55-60%; mild conc LVH, normal systolic function; mildly calcified AV annulus  . Colonoscopy with esophagogastroduodenoscopy (egd) N/A 03/19/2013    Procedure: COLONOSCOPY WITH ESOPHAGOGASTRODUODENOSCOPY (EGD);  Surgeon: Rogene Houston, MD;  Location: [facility];   Service: Endoscopy;  Laterality: N/A;  145  . Dg biopsy lung Left Jan 2015    Allergies: Aleve; Dilaudid; Dyazide; and Zithromax  Medications: Prior to Admission medications   Medication Sig Start Date End Date Taking? Authorizing Provider  acetaminophen (TYLENOL) 500 MG tablet Take 500 mg by mouth every 6 (six) hours as needed for mild pain or moderate pain.    Historical Provider, MD  allopurinol (ZYLOPRIM) 300 MG tablet take 1 tablet by mouth once daily 10/21/15   Mikey Kirschner, MD  ALPRAZolam (XANAX) 1 MG tablet TAKE ONE-HALF TO ONE TABLET BY MOUTH THREE TIMES DAILY AS NEEDED FOR ANXIETY Patient taking differently: TAKE ONE TABLET BY MOUTH THREE TIMES DAILY 08/02/15   Mikey Kirschner, MD  amLODipine (NORVASC) 5 MG tablet take 1 tablet by mouth once daily 06/29/15   Kathyrn Drown, MD  aspirin EC 81 MG tablet Take 81 mg by mouth daily.    Historical Provider, MD  donepezil (ARICEPT) 10 MG tablet Take 1 tablet daily 08/02/15   Cameron Sprang, MD  DULoxetine (CYMBALTA) 60 MG capsule take 1 capsule by mouth once daily 08/30/15   Mikey Kirschner, MD  enalapril (VASOTEC) 5 MG tablet Take 5 mg by mouth daily. 08/19/15   Historical Provider, MD  fenofibrate  160 MG tablet take 1 tablet by mouth once daily with food 09/21/15   Mikey Kirschner, MD  ferrous sulfate (FERROUSUL) 325 (65 FE) MG tablet Take 1 tablet (325 mg total) by mouth 2 (two) times daily after a meal. 03/19/13   Rogene Houston, MD  folic acid (FOLVITE) 1 MG tablet Take 1 tablet (1 mg total) by mouth daily. 08/03/15   Mikey Kirschner, MD  hydrALAZINE (APRESOLINE) 25 MG tablet take 1 tablet by mouth three times a day 10/21/15   Mikey Kirschner, MD  HYDROcodone-acetaminophen (NORCO/VICODIN) 5-325 MG tablet Take 1 tablet twice a day as needed for pain Patient taking differently: Take 1 tablet by mouth 2 (two) times daily as needed for moderate pain or severe pain. Take 1 tablet twice a day as needed for pain 06/14/15   Mikey Kirschner, MD    levothyroxine Wilmer Floor, LEVOTHROID) 137 MCG tablet take 1 tablet by mouth once daily 04/23/15   Mikey Kirschner, MD  lidocaine-prilocaine (EMLA) cream Apply 1 application topically as needed (port).    Historical Provider, MD  loperamide (IMODIUM) 2 MG capsule Take 2 mg by mouth 3 (three) times daily as needed for diarrhea or loose stools.     Historical Provider, MD  meclizine (ANTIVERT) 25 MG tablet Take 1 tablet (25 mg total) by mouth 3 (three) times daily as needed for dizziness. 08/19/15   Mikey Kirschner, MD  Menthol-Methyl Salicylate (MUSCLE RUB) 10-15 % CREA Apply 1 application topically as needed for muscle pain.    Historical Provider, MD  ondansetron (ZOFRAN-ODT) 4 MG disintegrating tablet dissolve 1 tablet ON TONGUE every 8 hours if needed for nausea and vomiting 08/30/15   Mikey Kirschner, MD  pantoprazole (PROTONIX) 40 MG tablet Take 1 tablet (40 mg total) by mouth daily. 07/22/15   Mikey Kirschner, MD  potassium chloride (K-DUR) 10 MEQ tablet Take 2 tabs daily Patient taking differently: Take 20 mEq by mouth daily. Take 2 tabs daily 04/08/15   Mikey Kirschner, MD  pravastatin (PRAVACHOL) 20 MG tablet take 1 tablet once daily 11/15/15   Mikey Kirschner, MD  QUEtiapine (SEROQUEL) 25 MG tablet Take 1 tablet (25 mg total) by mouth at bedtime. 08/02/15   Cameron Sprang, MD  QUEtiapine (SEROQUEL) 25 MG tablet take 1 tablet by mouth at bedtime 11/23/15   Mikey Kirschner, MD  [facility] HANDIHALER 18 MCG inhalation capsule Place 18 mcg into inhaler and inhale as needed (for shortness of breath).  11/14/14   Historical Provider, MD  traMADol Veatrice Bourbon) 50 MG tablet take 1 tablet by mouth every 6 hours if needed 11/15/15   Mikey Kirschner, MD  Vitamin D, Ergocalciferol, (DRISDOL) 50000 UNITS CAPS capsule Take 1 capsule (50,000 Units total) by mouth once a week. 06/14/15   Mikey Kirschner, MD     Family History  Problem Relation Age of Onset  . Hypertension Mother   . Diabetes Mother   . Heart  attack Mother   . Hyperlipidemia Sister     x3 sister  . Kidney disease Brother     Social History   Social History  . Marital Status: Divorced    Spouse Name: N/A  . Number of Children: 2  . Years of Education: N/A   Occupational History  .     Social History Main Topics  . Smoking status: Former Smoker -- 0.50 packs/day for 59 years    Types: Cigarettes  . Smokeless tobacco:  Never Used     Comment: smoking since age [age]  . Alcohol Use: No  . Drug Use: No  . Sexual Activity: Not on file   Other Topics Concern  . Not on file   Social History Narrative     Vital Signs: BP 133/67; T: 98.2; P: 81; O2sat 100 RA  Physical Exam  Constitutional: She is oriented to person, place, and time.  Abdominal: Soft. There is no tenderness.  Neurological: She is alert and oriented to person, place, and time.  Skin: Skin is warm and dry.  Site of chole drain is clean and dry No bleeding No sign of infection  Output bilious  Psychiatric: She has a normal mood and affect. Her behavior is normal. Judgment and thought content normal.  Vitals reviewed.   Imaging: No results found.  Labs:  CBC:  Recent Labs  08/16/15 1340 10/05/15 2145 10/06/15 0812 10/09/15 0522  WBC 4.4 7.7 6.5 3.8*  HGB 9.6* 10.1* 9.7* 8.5*  HCT 30.0* 30.8* 29.7* 27.2*  PLT 259 332 299 259    COAGS:  Recent Labs  10/07/15 0854  INR 1.23  APTT 39*    BMP:  Recent Labs  10/06/15 0812 10/07/15 0428 10/09/15 0522 10/10/15 0520  NA 140 139 140 141  K 3.6 3.9 3.3* 3.4*  CL 105 105 107 109  CO2 '24 22 24 23  '$ GLUCOSE 99 80 96 114*  BUN '11 12 12 9  '$ CALCIUM 9.3 8.8* 9.2 9.3  CREATININE 1.66* 1.67* 1.48* 1.58*  GFRNONAA 28* 28* 33* 30*  GFRAA 33* 33* 38* 35*    LIVER FUNCTION TESTS:  Recent Labs  10/06/15 0812 10/07/15 0428 10/09/15 0522 10/10/15 0520  BILITOT 0.8 0.8 0.5 0.6  AST '22 20 19 21  '$ ALT 10* 10* 8* 9*  ALKPHOS 34* 25* 23* 24*  PROT 6.0* 5.4* 5.4* 5.7*  ALBUMIN  3.4* 3.1* 2.8* 2.8*    Assessment:  Percutaneous cholecystomy drain placed 10/07/15 in IR Recheck today Pt doing well Dr Pascal Lux injected drain with contrast Shows patency of cystic and common bile duct without evidence of filling defects Pt tolerated procedure well Capped today Will have pt return in 1 week for follow up cholangiogram and possible removal Dr Pascal Lux will discuss with Dr Redmond Pulling  Pt has good understanding of plan Knows to reconnect to bag if develops any pain; N/V...  SignedMonia Sabal A 12/01/2015, 10:04 AM   Please refer to Dr. Pascal Lux attestation of this note for management and plan.

## 2015-12-02 DIAGNOSIS — J449 Chronic obstructive pulmonary disease, unspecified: Secondary | ICD-10-CM | POA: Diagnosis not present

## 2015-12-02 DIAGNOSIS — F0280 Dementia in other diseases classified elsewhere without behavioral disturbance: Secondary | ICD-10-CM | POA: Diagnosis not present

## 2015-12-02 DIAGNOSIS — E440 Moderate protein-calorie malnutrition: Secondary | ICD-10-CM | POA: Diagnosis not present

## 2015-12-02 DIAGNOSIS — R531 Weakness: Secondary | ICD-10-CM | POA: Diagnosis not present

## 2015-12-02 DIAGNOSIS — C3432 Malignant neoplasm of lower lobe, left bronchus or lung: Secondary | ICD-10-CM | POA: Diagnosis not present

## 2015-12-02 DIAGNOSIS — K819 Cholecystitis, unspecified: Secondary | ICD-10-CM | POA: Diagnosis not present

## 2015-12-04 DIAGNOSIS — K819 Cholecystitis, unspecified: Secondary | ICD-10-CM | POA: Diagnosis not present

## 2015-12-04 DIAGNOSIS — J449 Chronic obstructive pulmonary disease, unspecified: Secondary | ICD-10-CM | POA: Diagnosis not present

## 2015-12-04 DIAGNOSIS — F0280 Dementia in other diseases classified elsewhere without behavioral disturbance: Secondary | ICD-10-CM | POA: Diagnosis not present

## 2015-12-04 DIAGNOSIS — C3432 Malignant neoplasm of lower lobe, left bronchus or lung: Secondary | ICD-10-CM | POA: Diagnosis not present

## 2015-12-04 DIAGNOSIS — R531 Weakness: Secondary | ICD-10-CM | POA: Diagnosis not present

## 2015-12-04 DIAGNOSIS — E440 Moderate protein-calorie malnutrition: Secondary | ICD-10-CM | POA: Diagnosis not present

## 2015-12-06 ENCOUNTER — Ambulatory Visit (HOSPITAL_COMMUNITY): Payer: Medicare Other | Admitting: Oncology

## 2015-12-06 ENCOUNTER — Other Ambulatory Visit (HOSPITAL_COMMUNITY): Payer: Self-pay | Admitting: Oncology

## 2015-12-06 DIAGNOSIS — C3492 Malignant neoplasm of unspecified part of left bronchus or lung: Secondary | ICD-10-CM

## 2015-12-06 MED ORDER — LIDOCAINE-PRILOCAINE 2.5-2.5 % EX CREA: 1.0000 "application " | TOPICAL_CREAM | CUTANEOUS | Status: AC | PRN

## 2015-12-07 DIAGNOSIS — J449 Chronic obstructive pulmonary disease, unspecified: Secondary | ICD-10-CM | POA: Diagnosis not present

## 2015-12-07 DIAGNOSIS — E440 Moderate protein-calorie malnutrition: Secondary | ICD-10-CM | POA: Diagnosis not present

## 2015-12-07 DIAGNOSIS — F0280 Dementia in other diseases classified elsewhere without behavioral disturbance: Secondary | ICD-10-CM | POA: Diagnosis not present

## 2015-12-07 DIAGNOSIS — C3432 Malignant neoplasm of lower lobe, left bronchus or lung: Secondary | ICD-10-CM | POA: Diagnosis not present

## 2015-12-07 DIAGNOSIS — R531 Weakness: Secondary | ICD-10-CM | POA: Diagnosis not present

## 2015-12-07 DIAGNOSIS — K819 Cholecystitis, unspecified: Secondary | ICD-10-CM | POA: Diagnosis not present

## 2015-12-08 ENCOUNTER — Other Ambulatory Visit: Payer: Self-pay | Admitting: Radiology

## 2015-12-08 ENCOUNTER — Ambulatory Visit
Admission: RE | Admit: 2015-12-08 | Discharge: 2015-12-08 | Disposition: A | Discharge status: Home / Self Care | Payer: Medicare Other | Source: Ambulatory Visit | Attending: General Surgery | Admitting: General Surgery

## 2015-12-08 DIAGNOSIS — K8010 Calculus of gallbladder with chronic cholecystitis without obstruction: Secondary | ICD-10-CM

## 2015-12-08 DIAGNOSIS — K810 Acute cholecystitis: Secondary | ICD-10-CM

## 2015-12-08 NOTE — Progress Notes (Signed)
Referring Physician(s): [place]  Supervising Physician: Jacqulynn Cadet  Patient Status:  Outpatient  Chief Complaint: Follow up perc chole drain  Subjective: [name] returns for another chole tube check. She had cholangiogram last week which showed patency of the cystic an common bile ducts. Her tube was capped as a trial and she is back today for re-eval/possible removal. However, the family had to reconnect her to gravity bag as she developed some pains on the right side while capped. Since back to drainage bag, she no longer has the pain. Her drain was originally placed for calculus cholecystitis. She otherwise feels well, tolerates diet well without pain. Has had some thin bilious output from drain this past week.  Allergies: Aleve; Dilaudid; Dyazide; and Zithromax  Medications: Prior to Admission medications   Medication Sig Start Date End Date Taking? Authorizing Provider  acetaminophen (TYLENOL) 500 MG tablet Take 500 mg by mouth every 6 (six) hours as needed for mild pain or moderate pain.    Historical Provider, MD  allopurinol (ZYLOPRIM) 300 MG tablet take 1 tablet by mouth once daily 10/21/15   Mikey Kirschner, MD  ALPRAZolam (XANAX) 1 MG tablet TAKE ONE-HALF TO ONE TABLET BY MOUTH THREE TIMES DAILY AS NEEDED FOR ANXIETY Patient taking differently: TAKE ONE TABLET BY MOUTH THREE TIMES DAILY 08/02/15   Mikey Kirschner, MD  amLODipine (NORVASC) 5 MG tablet take 1 tablet by mouth once daily 06/29/15   Kathyrn Drown, MD  aspirin EC 81 MG tablet Take 81 mg by mouth daily.    Historical Provider, MD  donepezil (ARICEPT) 10 MG tablet Take 1 tablet daily 08/02/15   Cameron Sprang, MD  DULoxetine (CYMBALTA) 60 MG capsule take 1 capsule by mouth once daily 08/30/15   Mikey Kirschner, MD  enalapril (VASOTEC) 5 MG tablet Take 5 mg by mouth daily. 08/19/15   Historical Provider, MD  fenofibrate 160 MG tablet take 1 tablet by mouth once daily with food 09/21/15   Mikey Kirschner, MD  ferrous sulfate (FERROUSUL) 325 (65 FE) MG tablet Take 1 tablet (325 mg total) by mouth 2 (two) times daily after a meal. 03/19/13   Rogene Houston, MD  folic acid (FOLVITE) 1 MG tablet Take 1 tablet (1 mg total) by mouth daily. 08/03/15   Mikey Kirschner, MD  hydrALAZINE (APRESOLINE) 25 MG tablet take 1 tablet by mouth three times a day 10/21/15   Mikey Kirschner, MD  HYDROcodone-acetaminophen (NORCO/VICODIN) 5-325 MG tablet Take 1 tablet twice a day as needed for pain Patient taking differently: Take 1 tablet by mouth 2 (two) times daily as needed for moderate pain or severe pain. Take 1 tablet twice a day as needed for pain 06/14/15   Mikey Kirschner, MD  levothyroxine Wilmer Floor, LEVOTHROID) 137 MCG tablet take 1 tablet by mouth once daily 04/23/15   Mikey Kirschner, MD  lidocaine-prilocaine (EMLA) cream Apply 1 application topically as needed (port). 12/06/15   [facility] Cancer, PA-C  loperamide (IMODIUM) 2 MG capsule Take 2 mg by mouth 3 (three) times daily as needed for diarrhea or loose stools.     Historical Provider, MD  meclizine (ANTIVERT) 25 MG tablet Take 1 tablet (25 mg total) by mouth 3 (three) times daily as needed for dizziness. 08/19/15   Mikey Kirschner, MD  Menthol-Methyl Salicylate (MUSCLE RUB) 10-15 % CREA Apply 1 application topically as needed for muscle pain.    Historical Provider, MD  ondansetron (  ZOFRAN-ODT) 4 MG disintegrating tablet dissolve 1 tablet ON TONGUE every 8 hours if needed for nausea and vomiting 08/30/15   Mikey Kirschner, MD  pantoprazole (PROTONIX) 40 MG tablet Take 1 tablet (40 mg total) by mouth daily. 07/22/15   Mikey Kirschner, MD  potassium chloride (K-DUR) 10 MEQ tablet Take 2 tabs daily Patient taking differently: Take 20 mEq by mouth daily. Take 2 tabs daily 04/08/15   Mikey Kirschner, MD  pravastatin (PRAVACHOL) 20 MG tablet take 1 tablet once daily 11/15/15   Mikey Kirschner, MD  QUEtiapine (SEROQUEL) 25 MG tablet Take 1 tablet (25 mg  total) by mouth at bedtime. 08/02/15   Cameron Sprang, MD  QUEtiapine (SEROQUEL) 25 MG tablet take 1 tablet by mouth at bedtime 11/23/15   Mikey Kirschner, MD  [facility] HANDIHALER 18 MCG inhalation capsule Place 18 mcg into inhaler and inhale as needed (for shortness of breath).  11/14/14   Historical Provider, MD  traMADol Veatrice Bourbon) 50 MG tablet take 1 tablet by mouth every 6 hours if needed 11/15/15   Mikey Kirschner, MD  Vitamin D, Ergocalciferol, (DRISDOL) 50000 UNITS CAPS capsule Take 1 capsule (50,000 Units total) by mouth once a week. 06/14/15   Mikey Kirschner, MD     Vital Signs: BP 119/65 mmHg  Pulse 78  Temp(Src) 98.5 F (36.9 C)  SpO2 100%  Physical Exam  Abdominal: Soft. There is no tenderness.  RUQ drain intact, site clean, NT    Imaging: No results found.   Assessment and Plan: Calculus cholecystitis s/p perc chole drain As per imaging today, the cystic and CBD are patent, though there is continued presence of cholelithiasis. She became symptomatic with recent capping trial so will need to leave drain in. Can repeat capping trial for a little longer and see how she does. Will schedule her for [facility] at [facility] in about a month. She can have a prolonged capping trial and possible removal, otherwise will need drain exchange at that time.  Electronically Signed: Ascencion Dike 12/08/2015, 11:11 AM   I spent a total of 25 Minutes at the the patient's bedside AND on the patient's hospital floor or unit, greater than 50% of which was counseling/coordinating care for perc chole drain management

## 2015-12-09 DIAGNOSIS — K819 Cholecystitis, unspecified: Secondary | ICD-10-CM | POA: Diagnosis not present

## 2015-12-09 DIAGNOSIS — C3432 Malignant neoplasm of lower lobe, left bronchus or lung: Secondary | ICD-10-CM | POA: Diagnosis not present

## 2015-12-09 DIAGNOSIS — R531 Weakness: Secondary | ICD-10-CM | POA: Diagnosis not present

## 2015-12-09 DIAGNOSIS — J449 Chronic obstructive pulmonary disease, unspecified: Secondary | ICD-10-CM | POA: Diagnosis not present

## 2015-12-09 DIAGNOSIS — F0280 Dementia in other diseases classified elsewhere without behavioral disturbance: Secondary | ICD-10-CM | POA: Diagnosis not present

## 2015-12-09 DIAGNOSIS — E440 Moderate protein-calorie malnutrition: Secondary | ICD-10-CM | POA: Diagnosis not present

## 2015-12-10 ENCOUNTER — Telehealth: Payer: Self-pay | Admitting: Family Medicine

## 2015-12-10 NOTE — Telephone Encounter (Signed)
[facility] from [facility] is requesting an additional 4 weeks of physical therapy for the pt.

## 2015-12-13 ENCOUNTER — Telehealth: Payer: Self-pay | Admitting: Family Medicine

## 2015-12-13 ENCOUNTER — Ambulatory Visit: Payer: Medicare Other | Admitting: Family Medicine

## 2015-12-13 DIAGNOSIS — C3432 Malignant neoplasm of lower lobe, left bronchus or lung: Secondary | ICD-10-CM | POA: Diagnosis not present

## 2015-12-13 DIAGNOSIS — E440 Moderate protein-calorie malnutrition: Secondary | ICD-10-CM | POA: Diagnosis not present

## 2015-12-13 DIAGNOSIS — F0280 Dementia in other diseases classified elsewhere without behavioral disturbance: Secondary | ICD-10-CM | POA: Diagnosis not present

## 2015-12-13 DIAGNOSIS — K819 Cholecystitis, unspecified: Secondary | ICD-10-CM | POA: Diagnosis not present

## 2015-12-13 DIAGNOSIS — R531 Weakness: Secondary | ICD-10-CM | POA: Diagnosis not present

## 2015-12-13 DIAGNOSIS — J449 Chronic obstructive pulmonary disease, unspecified: Secondary | ICD-10-CM | POA: Diagnosis not present

## 2015-12-13 NOTE — Telephone Encounter (Signed)
Let's do 

## 2015-12-13 NOTE — Telephone Encounter (Signed)
Need orders for home health to continue weekly visits to monitor drain in stomach.

## 2015-12-13 NOTE — Telephone Encounter (Signed)
Notified Sarah from home health.

## 2015-12-14 ENCOUNTER — Telehealth: Payer: Self-pay | Admitting: Family Medicine

## 2015-12-14 NOTE — Telephone Encounter (Signed)
error 

## 2015-12-15 DIAGNOSIS — K8010 Calculus of gallbladder with chronic cholecystitis without obstruction: Secondary | ICD-10-CM | POA: Diagnosis not present

## 2015-12-15 DIAGNOSIS — Z434 Encounter for attention to other artificial openings of digestive tract: Secondary | ICD-10-CM | POA: Diagnosis not present

## 2015-12-16 DIAGNOSIS — I10 Essential (primary) hypertension: Secondary | ICD-10-CM | POA: Diagnosis not present

## 2015-12-16 DIAGNOSIS — R42 Dizziness and giddiness: Secondary | ICD-10-CM | POA: Diagnosis not present

## 2015-12-16 DIAGNOSIS — R531 Weakness: Secondary | ICD-10-CM | POA: Diagnosis not present

## 2015-12-16 DIAGNOSIS — J449 Chronic obstructive pulmonary disease, unspecified: Secondary | ICD-10-CM | POA: Diagnosis not present

## 2015-12-16 DIAGNOSIS — Z9981 Dependence on supplemental oxygen: Secondary | ICD-10-CM | POA: Diagnosis not present

## 2015-12-16 DIAGNOSIS — E440 Moderate protein-calorie malnutrition: Secondary | ICD-10-CM | POA: Diagnosis not present

## 2015-12-16 DIAGNOSIS — F0280 Dementia in other diseases classified elsewhere without behavioral disturbance: Secondary | ICD-10-CM | POA: Diagnosis not present

## 2015-12-16 DIAGNOSIS — I739 Peripheral vascular disease, unspecified: Secondary | ICD-10-CM | POA: Diagnosis not present

## 2015-12-16 DIAGNOSIS — E039 Hypothyroidism, unspecified: Secondary | ICD-10-CM | POA: Diagnosis not present

## 2015-12-16 DIAGNOSIS — E785 Hyperlipidemia, unspecified: Secondary | ICD-10-CM | POA: Diagnosis not present

## 2015-12-16 DIAGNOSIS — C3432 Malignant neoplasm of lower lobe, left bronchus or lung: Secondary | ICD-10-CM | POA: Diagnosis not present

## 2015-12-16 DIAGNOSIS — K819 Cholecystitis, unspecified: Secondary | ICD-10-CM | POA: Diagnosis not present

## 2015-12-16 DIAGNOSIS — Z438 Encounter for attention to other artificial openings: Secondary | ICD-10-CM | POA: Diagnosis not present

## 2015-12-17 DIAGNOSIS — J449 Chronic obstructive pulmonary disease, unspecified: Secondary | ICD-10-CM | POA: Diagnosis not present

## 2015-12-17 DIAGNOSIS — E440 Moderate protein-calorie malnutrition: Secondary | ICD-10-CM | POA: Diagnosis not present

## 2015-12-17 DIAGNOSIS — F0280 Dementia in other diseases classified elsewhere without behavioral disturbance: Secondary | ICD-10-CM | POA: Diagnosis not present

## 2015-12-17 DIAGNOSIS — C3432 Malignant neoplasm of lower lobe, left bronchus or lung: Secondary | ICD-10-CM | POA: Diagnosis not present

## 2015-12-17 DIAGNOSIS — R531 Weakness: Secondary | ICD-10-CM | POA: Diagnosis not present

## 2015-12-17 DIAGNOSIS — K819 Cholecystitis, unspecified: Secondary | ICD-10-CM | POA: Diagnosis not present

## 2015-12-20 ENCOUNTER — Other Ambulatory Visit: Payer: Self-pay | Admitting: Interventional Radiology

## 2015-12-20 DIAGNOSIS — K8000 Calculus of gallbladder with acute cholecystitis without obstruction: Secondary | ICD-10-CM

## 2015-12-20 DIAGNOSIS — E440 Moderate protein-calorie malnutrition: Secondary | ICD-10-CM | POA: Diagnosis not present

## 2015-12-20 DIAGNOSIS — J449 Chronic obstructive pulmonary disease, unspecified: Secondary | ICD-10-CM | POA: Diagnosis not present

## 2015-12-20 DIAGNOSIS — C3432 Malignant neoplasm of lower lobe, left bronchus or lung: Secondary | ICD-10-CM | POA: Diagnosis not present

## 2015-12-20 DIAGNOSIS — F0280 Dementia in other diseases classified elsewhere without behavioral disturbance: Secondary | ICD-10-CM | POA: Diagnosis not present

## 2015-12-20 DIAGNOSIS — R531 Weakness: Secondary | ICD-10-CM | POA: Diagnosis not present

## 2015-12-20 DIAGNOSIS — K819 Cholecystitis, unspecified: Secondary | ICD-10-CM | POA: Diagnosis not present

## 2015-12-21 DIAGNOSIS — C3432 Malignant neoplasm of lower lobe, left bronchus or lung: Secondary | ICD-10-CM | POA: Diagnosis not present

## 2015-12-21 DIAGNOSIS — F0280 Dementia in other diseases classified elsewhere without behavioral disturbance: Secondary | ICD-10-CM | POA: Diagnosis not present

## 2015-12-21 DIAGNOSIS — E440 Moderate protein-calorie malnutrition: Secondary | ICD-10-CM | POA: Diagnosis not present

## 2015-12-21 DIAGNOSIS — K819 Cholecystitis, unspecified: Secondary | ICD-10-CM | POA: Diagnosis not present

## 2015-12-21 DIAGNOSIS — R531 Weakness: Secondary | ICD-10-CM | POA: Diagnosis not present

## 2015-12-21 DIAGNOSIS — J449 Chronic obstructive pulmonary disease, unspecified: Secondary | ICD-10-CM | POA: Diagnosis not present

## 2015-12-22 DIAGNOSIS — R531 Weakness: Secondary | ICD-10-CM | POA: Diagnosis not present

## 2015-12-22 DIAGNOSIS — K819 Cholecystitis, unspecified: Secondary | ICD-10-CM | POA: Diagnosis not present

## 2015-12-22 DIAGNOSIS — E440 Moderate protein-calorie malnutrition: Secondary | ICD-10-CM | POA: Diagnosis not present

## 2015-12-22 DIAGNOSIS — C3432 Malignant neoplasm of lower lobe, left bronchus or lung: Secondary | ICD-10-CM | POA: Diagnosis not present

## 2015-12-22 DIAGNOSIS — J449 Chronic obstructive pulmonary disease, unspecified: Secondary | ICD-10-CM | POA: Diagnosis not present

## 2015-12-22 DIAGNOSIS — F0280 Dementia in other diseases classified elsewhere without behavioral disturbance: Secondary | ICD-10-CM | POA: Diagnosis not present

## 2015-12-24 DIAGNOSIS — C3432 Malignant neoplasm of lower lobe, left bronchus or lung: Secondary | ICD-10-CM | POA: Diagnosis not present

## 2015-12-24 DIAGNOSIS — E440 Moderate protein-calorie malnutrition: Secondary | ICD-10-CM | POA: Diagnosis not present

## 2015-12-24 DIAGNOSIS — F0280 Dementia in other diseases classified elsewhere without behavioral disturbance: Secondary | ICD-10-CM | POA: Diagnosis not present

## 2015-12-24 DIAGNOSIS — R531 Weakness: Secondary | ICD-10-CM | POA: Diagnosis not present

## 2015-12-24 DIAGNOSIS — J449 Chronic obstructive pulmonary disease, unspecified: Secondary | ICD-10-CM | POA: Diagnosis not present

## 2015-12-24 DIAGNOSIS — K819 Cholecystitis, unspecified: Secondary | ICD-10-CM | POA: Diagnosis not present

## 2015-12-27 ENCOUNTER — Ambulatory Visit (HOSPITAL_COMMUNITY)
Admission: RE | Admit: 2015-12-27 | Discharge: 2015-12-27 | Disposition: A | Discharge status: Home / Self Care | Payer: Medicare Other | Source: Ambulatory Visit | Attending: Radiology | Admitting: Radiology

## 2015-12-27 DIAGNOSIS — K810 Acute cholecystitis: Secondary | ICD-10-CM

## 2015-12-28 DIAGNOSIS — F0280 Dementia in other diseases classified elsewhere without behavioral disturbance: Secondary | ICD-10-CM | POA: Diagnosis not present

## 2015-12-28 DIAGNOSIS — J449 Chronic obstructive pulmonary disease, unspecified: Secondary | ICD-10-CM | POA: Diagnosis not present

## 2015-12-28 DIAGNOSIS — E440 Moderate protein-calorie malnutrition: Secondary | ICD-10-CM | POA: Diagnosis not present

## 2015-12-28 DIAGNOSIS — R531 Weakness: Secondary | ICD-10-CM | POA: Diagnosis not present

## 2015-12-28 DIAGNOSIS — K819 Cholecystitis, unspecified: Secondary | ICD-10-CM | POA: Diagnosis not present

## 2015-12-28 DIAGNOSIS — C3432 Malignant neoplasm of lower lobe, left bronchus or lung: Secondary | ICD-10-CM | POA: Diagnosis not present

## 2015-12-29 ENCOUNTER — Other Ambulatory Visit (HOSPITAL_COMMUNITY): Payer: Medicare Other

## 2015-12-29 DIAGNOSIS — J449 Chronic obstructive pulmonary disease, unspecified: Secondary | ICD-10-CM | POA: Diagnosis not present

## 2015-12-29 DIAGNOSIS — C3432 Malignant neoplasm of lower lobe, left bronchus or lung: Secondary | ICD-10-CM | POA: Diagnosis not present

## 2015-12-29 DIAGNOSIS — K819 Cholecystitis, unspecified: Secondary | ICD-10-CM | POA: Diagnosis not present

## 2015-12-29 DIAGNOSIS — E440 Moderate protein-calorie malnutrition: Secondary | ICD-10-CM | POA: Diagnosis not present

## 2015-12-29 DIAGNOSIS — F0280 Dementia in other diseases classified elsewhere without behavioral disturbance: Secondary | ICD-10-CM | POA: Diagnosis not present

## 2015-12-29 DIAGNOSIS — R531 Weakness: Secondary | ICD-10-CM | POA: Diagnosis not present

## 2015-12-30 ENCOUNTER — Encounter (HOSPITAL_COMMUNITY): Payer: Medicare Other

## 2015-12-31 DIAGNOSIS — J449 Chronic obstructive pulmonary disease, unspecified: Secondary | ICD-10-CM | POA: Diagnosis not present

## 2015-12-31 DIAGNOSIS — E440 Moderate protein-calorie malnutrition: Secondary | ICD-10-CM | POA: Diagnosis not present

## 2015-12-31 DIAGNOSIS — C3432 Malignant neoplasm of lower lobe, left bronchus or lung: Secondary | ICD-10-CM | POA: Diagnosis not present

## 2015-12-31 DIAGNOSIS — R531 Weakness: Secondary | ICD-10-CM | POA: Diagnosis not present

## 2015-12-31 DIAGNOSIS — F0280 Dementia in other diseases classified elsewhere without behavioral disturbance: Secondary | ICD-10-CM | POA: Diagnosis not present

## 2015-12-31 DIAGNOSIS — K819 Cholecystitis, unspecified: Secondary | ICD-10-CM | POA: Diagnosis not present

## 2016-01-03 DIAGNOSIS — J449 Chronic obstructive pulmonary disease, unspecified: Secondary | ICD-10-CM | POA: Diagnosis not present

## 2016-01-03 DIAGNOSIS — E440 Moderate protein-calorie malnutrition: Secondary | ICD-10-CM | POA: Diagnosis not present

## 2016-01-03 DIAGNOSIS — F0280 Dementia in other diseases classified elsewhere without behavioral disturbance: Secondary | ICD-10-CM | POA: Diagnosis not present

## 2016-01-03 DIAGNOSIS — R531 Weakness: Secondary | ICD-10-CM | POA: Diagnosis not present

## 2016-01-03 DIAGNOSIS — C3432 Malignant neoplasm of lower lobe, left bronchus or lung: Secondary | ICD-10-CM | POA: Diagnosis not present

## 2016-01-03 DIAGNOSIS — K819 Cholecystitis, unspecified: Secondary | ICD-10-CM | POA: Diagnosis not present

## 2016-01-04 DIAGNOSIS — K819 Cholecystitis, unspecified: Secondary | ICD-10-CM | POA: Diagnosis not present

## 2016-01-04 DIAGNOSIS — R531 Weakness: Secondary | ICD-10-CM | POA: Diagnosis not present

## 2016-01-04 DIAGNOSIS — F0280 Dementia in other diseases classified elsewhere without behavioral disturbance: Secondary | ICD-10-CM | POA: Diagnosis not present

## 2016-01-04 DIAGNOSIS — J449 Chronic obstructive pulmonary disease, unspecified: Secondary | ICD-10-CM | POA: Diagnosis not present

## 2016-01-04 DIAGNOSIS — E440 Moderate protein-calorie malnutrition: Secondary | ICD-10-CM | POA: Diagnosis not present

## 2016-01-04 DIAGNOSIS — C3432 Malignant neoplasm of lower lobe, left bronchus or lung: Secondary | ICD-10-CM | POA: Diagnosis not present

## 2016-01-05 ENCOUNTER — Encounter (HOSPITAL_COMMUNITY): Payer: Medicare Other

## 2016-01-06 DIAGNOSIS — C3432 Malignant neoplasm of lower lobe, left bronchus or lung: Secondary | ICD-10-CM | POA: Diagnosis not present

## 2016-01-06 DIAGNOSIS — K819 Cholecystitis, unspecified: Secondary | ICD-10-CM | POA: Diagnosis not present

## 2016-01-06 DIAGNOSIS — E440 Moderate protein-calorie malnutrition: Secondary | ICD-10-CM | POA: Diagnosis not present

## 2016-01-06 DIAGNOSIS — J449 Chronic obstructive pulmonary disease, unspecified: Secondary | ICD-10-CM | POA: Diagnosis not present

## 2016-01-06 DIAGNOSIS — F0280 Dementia in other diseases classified elsewhere without behavioral disturbance: Secondary | ICD-10-CM | POA: Diagnosis not present

## 2016-01-06 DIAGNOSIS — R531 Weakness: Secondary | ICD-10-CM | POA: Diagnosis not present

## 2016-01-07 ENCOUNTER — Other Ambulatory Visit (HOSPITAL_COMMUNITY): Payer: Medicare Other

## 2016-01-09 ENCOUNTER — Other Ambulatory Visit: Payer: Self-pay | Admitting: Family Medicine

## 2016-01-10 ENCOUNTER — Encounter (HOSPITAL_BASED_OUTPATIENT_CLINIC_OR_DEPARTMENT_OTHER): Payer: Medicare Other

## 2016-01-10 ENCOUNTER — Encounter (HOSPITAL_COMMUNITY): Payer: Self-pay | Admitting: Oncology

## 2016-01-10 ENCOUNTER — Encounter (HOSPITAL_COMMUNITY): Payer: Medicare Other | Attending: Hematology and Oncology | Admitting: Oncology

## 2016-01-10 VITALS — BP 131/70 | HR 87 | Temp 98.7°F | Resp 16 | Wt 138.6 lb

## 2016-01-10 DIAGNOSIS — Z85118 Personal history of other malignant neoplasm of bronchus and lung: Secondary | ICD-10-CM | POA: Diagnosis not present

## 2016-01-10 DIAGNOSIS — C3492 Malignant neoplasm of unspecified part of left bronchus or lung: Secondary | ICD-10-CM | POA: Insufficient documentation

## 2016-01-10 DIAGNOSIS — Z452 Encounter for adjustment and management of vascular access device: Secondary | ICD-10-CM | POA: Diagnosis not present

## 2016-01-10 DIAGNOSIS — R932 Abnormal findings on diagnostic imaging of liver and biliary tract: Secondary | ICD-10-CM

## 2016-01-10 MED ORDER — HEPARIN SOD (PORK) LOCK FLUSH 100 UNIT/ML IV SOLN
500.0000 [IU] | Freq: Once | INTRAVENOUS | Status: AC
  Administered 2016-01-10: 500 [IU] via INTRAVENOUS

## 2016-01-10 MED ORDER — SODIUM CHLORIDE 0.9% FLUSH
20.0000 mL | INTRAVENOUS | Status: DC | PRN
  Administered 2016-01-10: 20 mL via INTRAVENOUS
  Filled 2016-01-10: qty 20

## 2016-01-10 NOTE — Patient Instructions (Signed)
[place] at [facility] Discharge Instructions  RECOMMENDATIONS MADE BY THE CONSULTANT AND ANY TEST RESULTS WILL BE SENT TO YOUR REFERRING PHYSICIAN.  Exam done and seen today by Kirby Crigler Return to see the Doctor in 2 months No labs needed. Call the clinic for any concerns or questions.   Thank you for choosing [place] at [facility] to provide your oncology and hematology care.  To afford each patient quality time with our provider, please arrive at least 15 minutes before your scheduled appointment time.   Beginning January 23rd 2017 lab work for the [facility] will be done in the  Main lab at [facility] on 1st floor. If you have a lab appointment with the [facility] please come in thru the  Main Entrance and check in at the main information desk  You need to re-schedule your appointment should you arrive 10 or more minutes late.  We strive to give you quality time with our providers, and arriving late affects you and other patients whose appointments are after yours.  Also, if you no show three or more times for appointments you may be dismissed from the clinic at the providers discretion.     Again, thank you for choosing [facility]  Our hope is that these requests will decrease the amount of time that you wait before being seen by our physicians.       _____________________________________________________________  Should you have questions after your visit to [facility], please contact our office at [phone] between the hours of 8:30 a.m. and 4:30 p.m.  Voicemails left after 4:30 p.m. will not be returned until the following business day.  For prescription refill requests, have your pharmacy contact our office.         Resources For Cancer Patients and their Caregivers ? American Cancer Society: Can assist with transportation, wigs, general needs, runs Look Good Feel Better.         ([phone] ? Cancer Care: Provides financial assistance, online support groups, medication/co-pay assistance.  1-800-813-HOPE 941-460-0794) ? [place] Assists [place] cancer patients and their families through emotional , educational and financial support.  212 040 7147 ? [place] DSS Where to apply for food stamps, Medicaid and utility assistance. (618)259-6086 ? RCATS: Transportation to medical appointments. (435)421-5463 ? Social Security Administration: May apply for disability if have a Stage IV cancer. [phone])[phone] ? [employer], Disability and Transit Services: Assists with nutrition, care and transit needs. [place] Support Programs: '@10RELATIVEDAYS'$ @ > Cancer Support Group  2nd Tuesday of the month 1pm-2pm, Journey Room  > Creative Journey  3rd Tuesday of the month 1130am-1pm, Journey Room  > Look Good Feel Better  1st Wednesday of the month 10am-12 noon, Journey Room (Call [place] to register [phone])

## 2016-01-10 NOTE — Progress Notes (Signed)
Mickie Hillier, MD [place] [id number]  Squamous cell carcinoma of left lung [facility])  CURRENT THERAPY: Surveillance per NCCN guidelines  INTERVAL HISTORY: Villa Burgin 81 y.o. 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     Squamous cell carcinoma of left lung ([place])   06/30/2013 Imaging CT of chest demonstrates 4 cm left lower lobe lesion with other concerning findings for malignancy   07/09/2013 Imaging PET- 4.1 cm left lower oobe lesion with left hilar and bilateral mediastinal lymphadeopathy.  Hepatic lesion not confidently identified.  1.8 cm nodule in left upper lobe, cannot exclude low grade tumor.   07/16/2013 Initial Diagnosis Squamous cell carcinoma of left lung via CT-guided biopsy by IR   07/29/2013 Surgery IR placement of port-a-cath in preparation for concomittant chemoradiation    08/08/2013 - 09/12/2013 Chemotherapy Paclitaxel/Carboplatin weekly x 6   08/08/2013 - 09/19/2013 Radiation Therapy    10/14/2013 Imaging CT CAP- 3.2 cm posterior left lower lobe cavitary mass, corresponding to known primary bronchogenic neoplasm, improved. Small mediastinal nodes measuring up to 8 mm. Two hypodense hepatic lesions measuring up to 12 mm, poorly evaluated.   10/24/2013 Imaging MRI abd- Two liver lesions in the superior right hepatic lobe and left hepatic lobe remain indeterminate technically, but favor small benign hemangiomas over metastatic lesions.   02/02/2014 Imaging CT CAP- Decreasing size of the left lower lobe mass since prior study. 4 mm right lower lobe subpleural nodule, not seen on prior study.  Slight enlargement of hypodensities within the liver concerning for  metastases.   05/26/2014 Imaging CT CAP- Continued evolution of postradiation treatment related changes in the left lung without definite signs of residual or recurrent disease, and no definite evidence of metastatic disease in the chest, abdomen or pelvis on today's examination   07/16/2014 - 07/17/2014 Hospital Admission Acute on chronic renal failure with orthostatic hypotension.  Negative CT of head.     07/16/2014 Imaging CT head wo contrast- No evidence of intracranial metastatic disease noted on the present unenhanced head CT.   08/27/2014 Remission CT CAP- Stable appearance of the chest without definite signs of residual or recurrent disease and no evidence for metastatic disease in the chest, abdomen or pelvis.   04/13/2015 Imaging CT chest with stable L sided paramediastinal radiation fibrosis without definite findings for recurrent tumor, no pulm metastatic disease, stable to slightly larger segment 8 liver lesion, segment 4A lesion not seen, benign adenoma   10/04/2015 Imaging CT CAP- Enlarging intermediate density segment 4 liver lesion. Metastatic disease cannot be excluded.    She has been deemed not a candidate for cholecystectomy due to risk of procedure.  As a result, she is being followed by IR for perc chole drain.  She has appropriate follow-up with IR for management of this drain.  She notes that she is "tired of seeing som many doctors."  She notes that she sees some physicians and does not ascertain any information that is of value, but others she finds value in seeing them.    She notes fatigue.  She is in a wheelchair with Frio in place.  She denies any new or acute complaints/issues.  Review of Systems  Constitutional: Positive for malaise/fatigue. Negative for fever, chills  and weight loss.  HENT: Negative.  Negative for congestion.   Eyes: Negative.  Negative for blurred vision and double vision.  Respiratory: Negative.  Negative for cough, hemoptysis, sputum production,  shortness of breath and wheezing.   Cardiovascular: Negative.  Negative for chest pain.  Gastrointestinal: Negative.  Negative for nausea, vomiting, abdominal pain, diarrhea and constipation.  Genitourinary: Negative.  Negative for dysuria.  Musculoskeletal: Negative.  Negative for falls.  Skin: Negative.   Neurological: Positive for weakness. Negative for dizziness and headaches.  Endo/Heme/Allergies: Negative.   Psychiatric/Behavioral: Negative.      Past Medical History  Diagnosis Date  . Hypertension   . Hypothyroidism   . Hyperlipidemia   . Insomnia   . GERD (gastroesophageal reflux disease)     chronic gastritis  . Adrenal adenoma   . Chronic diarrhea   . Fatty liver   . Arthritis     knee  . QT prolongation     syncope  . Coronary artery disease   . Impaired fasting glucose   . Renal insufficiency     low protien diet  . Tobacco use     1/2 ppd, approx 25-50 pack years (as of 08/2012)  . Family history of heart disease   . IBS (irritable bowel syndrome)   . Peripheral arterial disease ([place])     sstatus post  infrarenal abdominal aortic tube graft placed by Dr. Victorino Dike February 2008  . Cancer ([place])     Lung  . Squamous cell carcinoma of left lung ([place]) 07/29/2013  . HCAP (healthcare-associated pneumonia)     10/2014  . Acute respiratory failure ([place])     10/2014  . COPD (chronic obstructive pulmonary disease) ([place])     moderat  . Pleural effusion     Past Surgical History  Procedure Laterality Date  . Abdominal hysterectomy    . Thyroidectomy, partial    . Colonoscopy  5/04    normal  . Abdominal aortic aneurysm repair  07/2006    D. J.D. Kellie Simmering  . Cataract extraction Bilateral 2010  . Cardiac catheterization  02/2010    mod CAD in L-dominant system with normal LV function  . Colon resection  2005    1/2 colon removed  . Transthoracic echocardiogram  02/2010    EF 55-60%; mild conc LVH, normal systolic function; mildly calcified AV annulus  .  Colonoscopy with esophagogastroduodenoscopy (egd) N/A 03/19/2013    Procedure: COLONOSCOPY WITH ESOPHAGOGASTRODUODENOSCOPY (EGD);  Surgeon: Rogene Houston, MD;  Location: [facility];  Service: Endoscopy;  Laterality: N/A;  145  . Dg biopsy lung Left Jan 2015    Social History   Social History  . Marital Status: Divorced    Spouse Name: N/A  . Number of Children: 2  . Years of Education: N/A   Occupational History  .     Social History Main Topics  . Smoking status: Former Smoker -- 0.50 packs/day for 59 years    Types: Cigarettes  . Smokeless tobacco: Never Used     Comment: smoking since age [age]  . Alcohol Use: No  . Drug Use: No  . Sexual Activity: Not Asked   Other Topics Concern  . None   Social History Narrative    PHYSICAL EXAMINATION  ECOG PERFORMANCE STATUS: 2 - Symptomatic, <50% confined to bed  Filed Vitals:   01/10/16 1354  BP: 131/70  Pulse: 87  Temp: 98.7 F (37.1 C)  Resp: 16    GENERAL:alert, no  distress, comfortable, cooperative, smiling, chronically ill appearing, in a wheelchair, Ross in place for O2 delivery, accompanied by her granddaughter. SKIN: skin color, texture, turgor are normal HEAD: Normocephalic, No masses, lesions, tenderness or abnormalities EYES: normal, Conjunctiva are pink and non-injected EARS: External ears normal OROPHARYNX:lips, buccal mucosa, and tongue normal and mucous membranes are moist  NECK: supple, trachea midline LYMPH:  not examined BREAST:not examined LUNGS: clear to auscultation and percussion HEART: regular rate & rhythm, no murmurs, no gallops, S1 normal and S2 normal ABDOMEN:abdomen soft, non-tender and normal bowel sounds, percutaneous drain in place, cleanly dressed, with bile in bag. BACK: Back symmetric, no curvature., No CVA tenderness EXTREMITIES:less then 2 second capillary refill, no joint deformities, effusion, or inflammation, no skin discoloration, no cyanosis  NEURO: alert & oriented x 3  with fluent speech, no focal motor/sensory deficits, in a wheelchair   LABORATORY DATA: CBC    Component Value Date/Time   WBC 3.8* 10/09/2015 0522   RBC 2.70* 10/09/2015 0522   HGB 8.5* 10/09/2015 0522   HCT 27.2* 10/09/2015 0522   PLT 259 10/09/2015 0522   MCV 100.7* 10/09/2015 0522   MCH 31.5 10/09/2015 0522   MCHC 31.3 10/09/2015 0522   RDW 13.9 10/09/2015 0522   LYMPHSABS 0.8 10/05/2015 2145   MONOABS 0.5 10/05/2015 2145   EOSABS 0.2 10/05/2015 2145   BASOSABS 0.0 10/05/2015 2145      Chemistry      Component Value Date/Time   NA 141 10/10/2015 0520   NA 142 09/02/2015 0927   K 3.4* 10/10/2015 0520   CL 109 10/10/2015 0520   CO2 23 10/10/2015 0520   BUN 9 10/10/2015 0520   BUN 17 09/02/2015 0927   CREATININE 1.58* 10/10/2015 0520   CREATININE 2.07* 06/24/2014 1239      Component Value Date/Time   CALCIUM 9.3 10/10/2015 0520   ALKPHOS 24* 10/10/2015 0520   AST 21 10/10/2015 0520   ALT 9* 10/10/2015 0520   BILITOT 0.6 10/10/2015 0520   BILITOT 0.4 09/02/2015 0927        PENDING LABS:   RADIOGRAPHIC STUDIES:  No results found.   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   Oncology history is updated.  She has been deemed not a surgical candidate for cholecystectomy by Gen Surg due to high risk.  As a result, she is continued on a percutaneous chole drain managed by IR.  She does have follow-up for this in the future with IR.  Again, we reviewed her imaging studies that are concerning for hepatic recurrence of her malignancy.  I personally reviewed and went over radiographic studies with the patient.  The results  are noted within this dictation.  She is not a candidate for treatment given her performance status and co-morbidities.  As a result, she is not interested in MRI imaging with biopsy.  I broach CODE STATUS with the patient.  She is given education regarding FULL CODE, PARTIAL/LIMITED CODE, and DNR.  We has a long conversation regarding the differences of these statuses and the pros and cons of each.  She lends me to believe that she wishes to be a dnr, but after providing her information regarding this, she would like  to discuss with family members first.  We will discuss at follow-up.  I also broached HOSPICE care.  She is a candidate for Hospice, but as typical, the mentioning of Hospice and its connotation is poorly received.  She is not interested at this time.  She is provided education regarding hospice and their services and role in her ongoing care.  She will return in 8 weeks for follow-up, further discussion regarding goals-of-care, code status, and hospice.    ORDERS PLACED FOR THIS ENCOUNTER: No orders of the defined types were placed in this encounter.    MEDICATIONS PRESCRIBED THIS ENCOUNTER: No orders of the defined types were placed in this encounter.    THERAPY PLAN:  Patient will discuss goals of care with her family.  All questions were answered. The patient knows to call the clinic with any problems, questions or concerns. We can certainly see the patient much sooner if necessary.  Patient and plan discussed with Dr. Ancil Linsey and she is in agreement with the aforementioned.   This note is electronically signed by: Doy Mince 01/10/2016 9:56 PM

## 2016-01-10 NOTE — Patient Instructions (Signed)
[place] at [facility] Discharge Instructions  RECOMMENDATIONS MADE BY THE CONSULTANT AND ANY TEST RESULTS WILL BE SENT TO YOUR REFERRING PHYSICIAN.  Port flush.    Thank you for choosing [place] at [facility] to provide your oncology and hematology care.  To afford each patient quality time with our provider, please arrive at least 15 minutes before your scheduled appointment time.   Beginning January 23rd 2017 lab work for the [facility] will be done in the  Main lab at [facility] on 1st floor. If you have a lab appointment with the [facility] please come in thru the  Main Entrance and check in at the main information desk  You need to re-schedule your appointment should you arrive 10 or more minutes late.  We strive to give you quality time with our providers, and arriving late affects you and other patients whose appointments are after yours.  Also, if you no show three or more times for appointments you may be dismissed from the clinic at the providers discretion.     Again, thank you for choosing [facility].  Our hope is that these requests will decrease the amount of time that you wait before being seen by our physicians.       _____________________________________________________________  Should you have questions after your visit to [facility], please contact our office at [phone] between the hours of 8:30 a.m. and 4:30 p.m.  Voicemails left after 4:30 p.m. will not be returned until the following business day.  For prescription refill requests, have your pharmacy contact our office.         Resources For Cancer Patients and their Caregivers ? American Cancer Society: Can assist with transportation, wigs, general needs, runs Look Good Feel Better.        ([phone] 435-5633 ? Cancer Care: Provides financial assistance, online support groups, medication/co-pay assistance.  1-800-813-HOPE  424-551-5605) ? [facility] Assists [place] cancer patients and their families through emotional , educational and financial support.  351-644-4436 ? [place] DSS Where to apply for food stamps, Medicaid and utility assistance. (646) 307-0144 ? [facility]: Transportation to medical appointments. 503-561-7626 ? Social Security Administration: May apply for disability if have a Stage IV cancer. 909-771-1659 5184276736 ? [employer], Disability and Transit Services: Assists with nutrition, care and transit needs. [place] Support Programs: '@10RELATIVEDAYS'$ @ > Cancer Support Group  2nd Tuesday of the month 1pm-2pm, Journey Room  > Creative Journey  3rd Tuesday of the month 1130am-1pm, Journey Room  > Look Good Feel Better  1st Wednesday of the month 10am-12 noon, Journey Room (Call [place] to register [phone])

## 2016-01-10 NOTE — Assessment & Plan Note (Signed)
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   Oncology history is updated.  She has been deemed not a surgical candidate for cholecystectomy by Gen Surg due to high risk.  As a result, she is continued on a percutaneous chole drain managed by IR.  She does have follow-up for this in the future with IR.  Again, we reviewed her imaging studies that are concerning for hepatic recurrence of her malignancy.  I personally reviewed and went over radiographic studies with the patient.  The results are noted within this dictation.  She is not a candidate for treatment given her performance status and co-morbidities.  As a result, she is not interested in MRI imaging with biopsy.  I broach CODE STATUS with the patient.  She is given education regarding FULL CODE, PARTIAL/LIMITED CODE, and DNR.  We has a long conversation regarding the differences of these statuses and the pros and cons of each.  She lends me to believe that she wishes to be a dnr, but after providing her information regarding this, she would like to discuss with family members first.  We will discuss at follow-up.  I also broached HOSPICE care.  She is a candidate for Hospice, but as typical, the mentioning of Hospice and its connotation is poorly received.  She is not interested at this time.  She is provided education regarding hospice and their services and role in her ongoing care.  She will return in 8 weeks for follow-up, further discussion regarding goals-of-care, code status, and hospice.

## 2016-01-10 NOTE — Progress Notes (Signed)
[name] presented for Portacath access and flush. Proper placement of portacath confirmed by CXR. Portacath located right chest wall accessed with  H 20 needle. No blood return but flushes well without difficulty.   Portacath flushed with 38m NS and 500U/532mHeparin and needle removed intact. Procedure without incident. Patient tolerated procedure well.

## 2016-01-11 ENCOUNTER — Other Ambulatory Visit: Payer: Self-pay | Admitting: Radiology

## 2016-01-11 ENCOUNTER — Ambulatory Visit (HOSPITAL_COMMUNITY)
Admission: RE | Admit: 2016-01-11 | Discharge: 2016-01-11 | Disposition: A | Discharge status: Home / Self Care | Payer: Medicare Other | Source: Ambulatory Visit | Attending: Radiology | Admitting: Radiology

## 2016-01-11 DIAGNOSIS — Z434 Encounter for attention to other artificial openings of digestive tract: Secondary | ICD-10-CM | POA: Diagnosis not present

## 2016-01-11 DIAGNOSIS — Z4682 Encounter for fitting and adjustment of non-vascular catheter: Secondary | ICD-10-CM | POA: Insufficient documentation

## 2016-01-11 DIAGNOSIS — K8000 Calculus of gallbladder with acute cholecystitis without obstruction: Secondary | ICD-10-CM | POA: Insufficient documentation

## 2016-01-11 DIAGNOSIS — K810 Acute cholecystitis: Secondary | ICD-10-CM

## 2016-01-11 MED ORDER — LIDOCAINE HCL 1 % IJ SOLN
INTRAMUSCULAR | Status: AC
Start: 2016-01-11 — End: 2016-01-12
  Filled 2016-01-11: qty 20

## 2016-01-11 MED ORDER — LIDOCAINE HCL 1 % IJ SOLN
INTRAMUSCULAR | Status: DC | PRN
  Administered 2016-01-11: 5 mL

## 2016-01-11 MED ORDER — IOPAMIDOL (ISOVUE-300) INJECTION 61%
50.0000 mL | Freq: Once | INTRAVENOUS | Status: AC | PRN
  Administered 2016-01-11: 10 mL via INTRAVENOUS

## 2016-01-13 DIAGNOSIS — K819 Cholecystitis, unspecified: Secondary | ICD-10-CM | POA: Diagnosis not present

## 2016-01-13 DIAGNOSIS — C3432 Malignant neoplasm of lower lobe, left bronchus or lung: Secondary | ICD-10-CM | POA: Diagnosis not present

## 2016-01-13 DIAGNOSIS — E440 Moderate protein-calorie malnutrition: Secondary | ICD-10-CM | POA: Diagnosis not present

## 2016-01-13 DIAGNOSIS — R531 Weakness: Secondary | ICD-10-CM | POA: Diagnosis not present

## 2016-01-13 DIAGNOSIS — J449 Chronic obstructive pulmonary disease, unspecified: Secondary | ICD-10-CM | POA: Diagnosis not present

## 2016-01-13 DIAGNOSIS — F0280 Dementia in other diseases classified elsewhere without behavioral disturbance: Secondary | ICD-10-CM | POA: Diagnosis not present

## 2016-01-17 ENCOUNTER — Ambulatory Visit (INDEPENDENT_AMBULATORY_CARE_PROVIDER_SITE_OTHER): Payer: Medicare Other | Admitting: Family Medicine

## 2016-01-17 ENCOUNTER — Encounter: Payer: Self-pay | Admitting: Family Medicine

## 2016-01-17 VITALS — BP 122/74 | Ht 67.0 in | Wt 138.6 lb

## 2016-01-17 DIAGNOSIS — I10 Essential (primary) hypertension: Secondary | ICD-10-CM

## 2016-01-17 DIAGNOSIS — F0391 Unspecified dementia with behavioral disturbance: Secondary | ICD-10-CM

## 2016-01-17 DIAGNOSIS — C3492 Malignant neoplasm of unspecified part of left bronchus or lung: Secondary | ICD-10-CM

## 2016-01-17 DIAGNOSIS — F411 Generalized anxiety disorder: Secondary | ICD-10-CM | POA: Diagnosis not present

## 2016-01-17 DIAGNOSIS — E785 Hyperlipidemia, unspecified: Secondary | ICD-10-CM | POA: Diagnosis not present

## 2016-01-17 DIAGNOSIS — F03918 Unspecified dementia, unspecified severity, with other behavioral disturbance: Secondary | ICD-10-CM

## 2016-01-17 MED ORDER — ALPRAZOLAM 1 MG PO TABS: ORAL_TABLET | ORAL | 5 refills | Status: DC

## 2016-01-17 MED ORDER — LEVOTHYROXINE SODIUM 137 MCG PO TABS: 137.0000 ug | ORAL_TABLET | Freq: Every day | ORAL | 5 refills | Status: DC

## 2016-01-17 MED ORDER — AMLODIPINE BESYLATE 5 MG PO TABS: 5.0000 mg | ORAL_TABLET | Freq: Every day | ORAL | 0 refills | Status: DC

## 2016-01-17 MED ORDER — HYDROCODONE-ACETAMINOPHEN 5-325 MG PO TABS: ORAL_TABLET | ORAL | 0 refills | Status: DC

## 2016-01-17 MED ORDER — DULOXETINE HCL 60 MG PO CPEP: 60.0000 mg | ORAL_CAPSULE | Freq: Every day | ORAL | 5 refills | Status: DC

## 2016-01-17 NOTE — Progress Notes (Signed)
   Subjective:    Patient ID: [name], female    DOB: 1934/12/13, 81 y.o.   MRN: [id number] Patient arrives office with numerous concerns  Under treatment for metastatic lung cancer. Please see oncologist note ongoing challenges with this.  Did have one episode of very dark stools. However often gets this with her chronic iron supplement. Oncologist for following her hemoglobin last one was stable. Next  She continues to receive chemotherapy.  Due to have follow-up scan soon to check status of metastatic lung cancer  Continues to take lipid medication. Prior blood work reviewed.  Notes intermittent headaches sometimes fairly severe. Over-the-counter meds do not help. Would like some hydrocodone to use just when necessary.  Reports her chronic anxiety overall is fairly stable at this time. Hypertension  This is a chronic problem. The current episode started more than 1 year ago. Risk factors for coronary artery disease include post-menopausal state. Treatments tried: norvasc, vasotec, donepezil. There are no compliance problems.     Patient requests a rx of hydrocodone for headaches. Patient also reports she is having stomach issues and diarrhea  Review of Systems No headache, no major weight loss or weight gain, no chest pain no back pain abdominal pain no change in bowel habits complete ROS otherwise negative     Objective:   Physical Exam Alert vital stable blood pressure good on repeat HEENT normal. Lungs no crackles no wheezes heart regular in rhythm. Abdomen good bowel sounds no discrete tenderness positive drain for chronic gallbladder cholecystitis present       Assessment & Plan:  Impression 1 metastatic lung cancer discuss #2 hyperlipidemia time this stop the medication. Rationale discussed with the #3 hypertension good control maintain same meds #4 anxiety ongoing #5 transient black stool likely due to iron do not feel mega-GI workup warranted at this time discussed  with family. Follow-up in several months. Stop lipid medication. Other meds refilled [facility]

## 2016-01-18 ENCOUNTER — Telehealth: Payer: Self-pay | Admitting: Family Medicine

## 2016-01-18 NOTE — Telephone Encounter (Signed)
Chol med

## 2016-01-18 NOTE — Telephone Encounter (Signed)
Spoke with patient's daughter and informed her per Dr.Steve Luking- The cholesterol medications were discontinued yesterday. Patient's daughter verbalized understanding.

## 2016-01-18 NOTE — Telephone Encounter (Signed)
Patients' daughter called regarding the visit Jaanvi had with Dr. Richardson Landry yesterday.  She was taken off one of her medications, and Fraser Din wants to verify which medication that was.  Please advise.

## 2016-01-19 ENCOUNTER — Other Ambulatory Visit: Payer: Self-pay | Admitting: Family Medicine

## 2016-01-19 ENCOUNTER — Other Ambulatory Visit: Payer: Self-pay | Admitting: Neurology

## 2016-01-19 DIAGNOSIS — F03B0 Unspecified dementia, moderate, without behavioral disturbance, psychotic disturbance, mood disturbance, and anxiety: Secondary | ICD-10-CM

## 2016-01-19 DIAGNOSIS — F0390 Unspecified dementia without behavioral disturbance: Secondary | ICD-10-CM

## 2016-01-19 DIAGNOSIS — K819 Cholecystitis, unspecified: Secondary | ICD-10-CM | POA: Diagnosis not present

## 2016-01-19 DIAGNOSIS — E440 Moderate protein-calorie malnutrition: Secondary | ICD-10-CM | POA: Diagnosis not present

## 2016-01-19 DIAGNOSIS — R531 Weakness: Secondary | ICD-10-CM | POA: Diagnosis not present

## 2016-01-19 DIAGNOSIS — C3432 Malignant neoplasm of lower lobe, left bronchus or lung: Secondary | ICD-10-CM | POA: Diagnosis not present

## 2016-01-19 DIAGNOSIS — F0280 Dementia in other diseases classified elsewhere without behavioral disturbance: Secondary | ICD-10-CM | POA: Diagnosis not present

## 2016-01-19 DIAGNOSIS — J449 Chronic obstructive pulmonary disease, unspecified: Secondary | ICD-10-CM | POA: Diagnosis not present

## 2016-01-19 NOTE — Telephone Encounter (Signed)
1. Aricept refill requested. Per last office note- patient to remain on medication. Refill approved and sent to patient's pharmacy.

## 2016-01-27 DIAGNOSIS — J449 Chronic obstructive pulmonary disease, unspecified: Secondary | ICD-10-CM | POA: Diagnosis not present

## 2016-01-27 DIAGNOSIS — C3432 Malignant neoplasm of lower lobe, left bronchus or lung: Secondary | ICD-10-CM | POA: Diagnosis not present

## 2016-01-27 DIAGNOSIS — F0280 Dementia in other diseases classified elsewhere without behavioral disturbance: Secondary | ICD-10-CM | POA: Diagnosis not present

## 2016-01-27 DIAGNOSIS — R531 Weakness: Secondary | ICD-10-CM | POA: Diagnosis not present

## 2016-01-27 DIAGNOSIS — E440 Moderate protein-calorie malnutrition: Secondary | ICD-10-CM | POA: Diagnosis not present

## 2016-01-27 DIAGNOSIS — K819 Cholecystitis, unspecified: Secondary | ICD-10-CM | POA: Diagnosis not present

## 2016-01-28 DIAGNOSIS — E440 Moderate protein-calorie malnutrition: Secondary | ICD-10-CM | POA: Diagnosis not present

## 2016-01-28 DIAGNOSIS — K819 Cholecystitis, unspecified: Secondary | ICD-10-CM | POA: Diagnosis not present

## 2016-01-28 DIAGNOSIS — R531 Weakness: Secondary | ICD-10-CM | POA: Diagnosis not present

## 2016-01-28 DIAGNOSIS — J449 Chronic obstructive pulmonary disease, unspecified: Secondary | ICD-10-CM | POA: Diagnosis not present

## 2016-01-28 DIAGNOSIS — F0280 Dementia in other diseases classified elsewhere without behavioral disturbance: Secondary | ICD-10-CM | POA: Diagnosis not present

## 2016-01-28 DIAGNOSIS — C3432 Malignant neoplasm of lower lobe, left bronchus or lung: Secondary | ICD-10-CM | POA: Diagnosis not present

## 2016-02-02 ENCOUNTER — Telehealth: Payer: Self-pay | Admitting: Family Medicine

## 2016-02-02 DIAGNOSIS — K819 Cholecystitis, unspecified: Secondary | ICD-10-CM | POA: Diagnosis not present

## 2016-02-02 DIAGNOSIS — F0280 Dementia in other diseases classified elsewhere without behavioral disturbance: Secondary | ICD-10-CM | POA: Diagnosis not present

## 2016-02-02 DIAGNOSIS — C3432 Malignant neoplasm of lower lobe, left bronchus or lung: Secondary | ICD-10-CM | POA: Diagnosis not present

## 2016-02-02 DIAGNOSIS — J449 Chronic obstructive pulmonary disease, unspecified: Secondary | ICD-10-CM | POA: Diagnosis not present

## 2016-02-02 DIAGNOSIS — R531 Weakness: Secondary | ICD-10-CM | POA: Diagnosis not present

## 2016-02-02 DIAGNOSIS — E440 Moderate protein-calorie malnutrition: Secondary | ICD-10-CM | POA: Diagnosis not present

## 2016-02-02 NOTE — Telephone Encounter (Signed)
So noted 

## 2016-02-02 NOTE — Telephone Encounter (Signed)
[name] Stable [name] called to say the pt fell yesterday after getting tangled in her oxygen cord. Denies any injuries and the RN states she checked out just fine.    [phone] [name] if you have any questions

## 2016-02-09 ENCOUNTER — Other Ambulatory Visit: Payer: Self-pay | Admitting: Family Medicine

## 2016-02-11 DIAGNOSIS — R531 Weakness: Secondary | ICD-10-CM | POA: Diagnosis not present

## 2016-02-11 DIAGNOSIS — C3432 Malignant neoplasm of lower lobe, left bronchus or lung: Secondary | ICD-10-CM | POA: Diagnosis not present

## 2016-02-11 DIAGNOSIS — J449 Chronic obstructive pulmonary disease, unspecified: Secondary | ICD-10-CM | POA: Diagnosis not present

## 2016-02-11 DIAGNOSIS — E440 Moderate protein-calorie malnutrition: Secondary | ICD-10-CM | POA: Diagnosis not present

## 2016-02-11 DIAGNOSIS — F0280 Dementia in other diseases classified elsewhere without behavioral disturbance: Secondary | ICD-10-CM | POA: Diagnosis not present

## 2016-02-11 DIAGNOSIS — K819 Cholecystitis, unspecified: Secondary | ICD-10-CM | POA: Diagnosis not present

## 2016-03-02 ENCOUNTER — Emergency Department (HOSPITAL_COMMUNITY): Payer: Medicare Other

## 2016-03-02 ENCOUNTER — Emergency Department (HOSPITAL_COMMUNITY)
Admission: EM | Admit: 2016-03-02 | Discharge: 2016-03-02 | Disposition: A | Discharge status: Home / Self Care | Payer: Medicare Other | Attending: Emergency Medicine | Admitting: Emergency Medicine

## 2016-03-02 ENCOUNTER — Encounter (HOSPITAL_COMMUNITY): Payer: Self-pay | Admitting: Emergency Medicine

## 2016-03-02 DIAGNOSIS — S4992XA Unspecified injury of left shoulder and upper arm, initial encounter: Secondary | ICD-10-CM | POA: Diagnosis not present

## 2016-03-02 DIAGNOSIS — S80211A Abrasion, right knee, initial encounter: Secondary | ICD-10-CM | POA: Diagnosis not present

## 2016-03-02 DIAGNOSIS — M25512 Pain in left shoulder: Secondary | ICD-10-CM | POA: Insufficient documentation

## 2016-03-02 DIAGNOSIS — S4991XA Unspecified injury of right shoulder and upper arm, initial encounter: Secondary | ICD-10-CM | POA: Diagnosis not present

## 2016-03-02 DIAGNOSIS — Z79899 Other long term (current) drug therapy: Secondary | ICD-10-CM | POA: Insufficient documentation

## 2016-03-02 DIAGNOSIS — I10 Essential (primary) hypertension: Secondary | ICD-10-CM | POA: Diagnosis not present

## 2016-03-02 DIAGNOSIS — S0990XA Unspecified injury of head, initial encounter: Secondary | ICD-10-CM | POA: Diagnosis not present

## 2016-03-02 DIAGNOSIS — Z7982 Long term (current) use of aspirin: Secondary | ICD-10-CM | POA: Diagnosis not present

## 2016-03-02 DIAGNOSIS — M25552 Pain in left hip: Secondary | ICD-10-CM | POA: Diagnosis not present

## 2016-03-02 DIAGNOSIS — M25511 Pain in right shoulder: Secondary | ICD-10-CM | POA: Insufficient documentation

## 2016-03-02 DIAGNOSIS — W0110XA Fall on same level from slipping, tripping and stumbling with subsequent striking against unspecified object, initial encounter: Secondary | ICD-10-CM | POA: Diagnosis not present

## 2016-03-02 DIAGNOSIS — Y92009 Unspecified place in unspecified non-institutional (private) residence as the place of occurrence of the external cause: Secondary | ICD-10-CM | POA: Diagnosis not present

## 2016-03-02 DIAGNOSIS — J449 Chronic obstructive pulmonary disease, unspecified: Secondary | ICD-10-CM | POA: Insufficient documentation

## 2016-03-02 DIAGNOSIS — W19XXXA Unspecified fall, initial encounter: Secondary | ICD-10-CM

## 2016-03-02 DIAGNOSIS — Y9301 Activity, walking, marching and hiking: Secondary | ICD-10-CM | POA: Diagnosis not present

## 2016-03-02 DIAGNOSIS — S79912A Unspecified injury of left hip, initial encounter: Secondary | ICD-10-CM | POA: Diagnosis not present

## 2016-03-02 DIAGNOSIS — I2510 Atherosclerotic heart disease of native coronary artery without angina pectoris: Secondary | ICD-10-CM | POA: Insufficient documentation

## 2016-03-02 DIAGNOSIS — E039 Hypothyroidism, unspecified: Secondary | ICD-10-CM | POA: Insufficient documentation

## 2016-03-02 DIAGNOSIS — Y999 Unspecified external cause status: Secondary | ICD-10-CM | POA: Diagnosis not present

## 2016-03-02 DIAGNOSIS — M25561 Pain in right knee: Secondary | ICD-10-CM | POA: Diagnosis not present

## 2016-03-02 DIAGNOSIS — S199XXA Unspecified injury of neck, initial encounter: Secondary | ICD-10-CM | POA: Diagnosis not present

## 2016-03-02 DIAGNOSIS — S8991XA Unspecified injury of right lower leg, initial encounter: Secondary | ICD-10-CM | POA: Diagnosis present

## 2016-03-02 DIAGNOSIS — R52 Pain, unspecified: Secondary | ICD-10-CM

## 2016-03-02 DIAGNOSIS — R51 Headache: Secondary | ICD-10-CM | POA: Diagnosis not present

## 2016-03-02 MED ORDER — BACITRACIN-NEOMYCIN-POLYMYXIN 400-5-5000 EX OINT
TOPICAL_OINTMENT | Freq: Once | CUTANEOUS | Status: AC
  Administered 2016-03-02: 22:00:00 via TOPICAL
  Filled 2016-03-02: qty 1

## 2016-03-02 NOTE — ED Notes (Signed)
Ambulated pt in room w/  EDP present. Verbal order for antibiotic ointment on right knee W/ band aid.

## 2016-03-02 NOTE — ED Triage Notes (Signed)
PT stated she fell walking around her couch today and hit the right side of her head on the floor. PT denies any changes in vision but c/o soreness to right forearm.

## 2016-03-02 NOTE — ED Provider Notes (Addendum)
[facility] DEPT Provider Note   CSN: [id number] Arrival date & time: 03/02/16  1640     History   Chief Complaint Chief Complaint  Patient presents with  . Fall    HPI [name] is a 81 y.o. female.  HPI she is obtained from patient and from patient's sister who accompanies her patient fell today at home approximate 4 PM , after she tripped over her oxygen cord. She complains of bilateral shoulder pain left hip pain left knee pain. She also struck her head as a result of the event and complained of right-sided parietal headache after the event. She presently denies pain anywhere. There was no loss of consciousness. Event was witnessed by her sister who accompanies her. Patient reports she was feeling well prior to the event. No treatment prior to coming here nothing makes symptoms better or worse.  Past Medical History:  Diagnosis Date  . Acute respiratory failure ([place])    10/2014  . Adrenal adenoma   . Arthritis    knee  . Cancer ([place])    Lung  . Chronic diarrhea   . COPD (chronic obstructive pulmonary disease) ([place])    moderat  . Coronary artery disease   . Family history of heart disease   . Fatty liver   . GERD (gastroesophageal reflux disease)    chronic gastritis  . HCAP (healthcare-associated pneumonia)    10/2014  . Hyperlipidemia   . Hypertension   . Hypothyroidism   . IBS (irritable bowel syndrome)   . Impaired fasting glucose   . Insomnia   . Peripheral arterial disease ([place])    sstatus post  infrarenal abdominal aortic tube graft placed by Dr. Victorino Dike February 2008  . Pleural effusion   . QT prolongation    syncope  . Renal insufficiency    low protien diet  . Squamous cell carcinoma of left lung ([place]) 07/29/2013  . Tobacco use    1/2 ppd, approx 25-50 pack years (as of 08/2012)    Patient Active Problem List   Diagnosis Date Noted  . Malnutrition of moderate degree 10/08/2015  . Acute cholecystitis 10/06/2015  . Moderate dementia with  behavioral disturbance 08/05/2015  . Moderate dementia without behavioral disturbance 01/26/2015  . Depression 01/26/2015  . Acute respiratory failure with hypoxia ([place]) 11/16/2014  . Hypoxia 11/15/2014  . SOB (shortness of breath)   . Orthostatic hypotension 07/20/2014  . Loose stools 07/20/2014  . Failure to thrive in adult 07/20/2014  . Syncope 07/17/2014  . Near syncope 07/16/2014  . Cholelithiasis 11/07/2013  . AKI (acute kidney injury) ([place]) 09/22/2013  . COPD, moderate ([place]) 09/22/2013  . Squamous cell carcinoma of left lung ([place]) 07/29/2013  . Anemia, iron deficiency 03/03/2013  . Coronary artery disease 02/28/2013  . Essential hypertension 02/28/2013  . Hyperlipidemia 02/28/2013  . Peripheral arterial disease ([place]) 02/28/2013  . Generalized anxiety disorder 12/10/2012  . Insomnia 12/10/2012  . Esophageal reflux 12/10/2012  . Chronic renal insufficiency 11/07/2012  . Unspecified hypothyroidism 09/10/2012    Past Surgical History:  Procedure Laterality Date  . ABDOMINAL AORTIC ANEURYSM REPAIR  07/2006   D. J.D. Kellie Simmering  . ABDOMINAL HYSTERECTOMY    . CARDIAC CATHETERIZATION  02/2010   mod CAD in L-dominant system with normal LV function  . CATARACT EXTRACTION Bilateral 2010  . COLON RESECTION  2005   1/2 colon removed  . COLONOSCOPY  5/04   normal  . COLONOSCOPY WITH ESOPHAGOGASTRODUODENOSCOPY (EGD) N/A 03/19/2013   Procedure: COLONOSCOPY  WITH ESOPHAGOGASTRODUODENOSCOPY (EGD);  Surgeon: Rogene Houston, MD;  Location: [facility];  Service: Endoscopy;  Laterality: N/A;  145  . DG BIOPSY LUNG Left Jan 2015  . THYROIDECTOMY, PARTIAL    . TRANSTHORACIC ECHOCARDIOGRAM  02/2010   EF 55-60%; mild conc LVH, normal systolic function; mildly calcified AV annulus    OB History    Gravida Para Term Preterm AB Living   '2 2 2         '$ SAB TAB Ectopic Multiple Live Births                   Home Medications    Prior to Admission medications   Medication Sig Start Date  End Date Taking? Authorizing Provider  acetaminophen (TYLENOL) 500 MG tablet Take 500 mg by mouth every 6 (six) hours as needed for mild pain or moderate pain.    Historical Provider, MD  allopurinol (ZYLOPRIM) 300 MG tablet take 1 tablet by mouth once daily 10/21/15   Mikey Kirschner, MD  ALPRAZolam Duanne Moron) 1 MG tablet TAKE ONE-HALF TO ONE TABLET BY MOUTH THREE TIMES DAILY AS NEEDED FOR ANXIETY 01/17/16   Mikey Kirschner, MD  amLODipine (NORVASC) 5 MG tablet Take 1 tablet (5 mg total) by mouth daily. 01/17/16   Mikey Kirschner, MD  aspirin EC 81 MG tablet Take 81 mg by mouth daily.    Historical Provider, MD  donepezil (ARICEPT) 10 MG tablet Take 1 tablet daily 08/02/15   Cameron Sprang, MD  donepezil (ARICEPT) 10 MG tablet take 1 tablet by mouth once daily 01/19/16   Cameron Sprang, MD  DULoxetine (CYMBALTA) 60 MG capsule Take 1 capsule (60 mg total) by mouth daily. 01/17/16   Mikey Kirschner, MD  enalapril (VASOTEC) 5 MG tablet Take 5 mg by mouth daily. 08/19/15   Historical Provider, MD  ferrous sulfate (FERROUSUL) 325 (65 FE) MG tablet Take 1 tablet (325 mg total) by mouth 2 (two) times daily after a meal. 03/19/13   Rogene Houston, MD  folic acid (FOLVITE) 1 MG tablet Take 1 tablet (1 mg total) by mouth daily. 08/03/15   Mikey Kirschner, MD  folic acid (FOLVITE) 1 MG tablet take 1 tablet by mouth once daily 01/19/16   Mikey Kirschner, MD  hydrALAZINE (APRESOLINE) 25 MG tablet take 1 tablet by mouth three times a day 10/21/15   Mikey Kirschner, MD  HYDROcodone-acetaminophen (NORCO/VICODIN) 5-325 MG tablet Take 1 tablet twice a day as needed for pain 01/17/16   Mikey Kirschner, MD  levothyroxine (SYNTHROID, LEVOTHROID) 137 MCG tablet Take 1 tablet (137 mcg total) by mouth daily. 01/17/16   Mikey Kirschner, MD  lidocaine-prilocaine (EMLA) cream Apply 1 application topically as needed (port). 12/06/15   Baird Cancer, PA-C  loperamide (IMODIUM) 2 MG capsule Take 2 mg by mouth 3 (three) times daily as  needed for diarrhea or loose stools.     Historical Provider, MD  meclizine (ANTIVERT) 25 MG tablet Take 1 tablet (25 mg total) by mouth 3 (three) times daily as needed for dizziness. 08/19/15   Mikey Kirschner, MD  Menthol-Methyl Salicylate (MUSCLE RUB) 10-15 % CREA Apply 1 application topically as needed for muscle pain.    Historical Provider, MD  ondansetron (ZOFRAN-ODT) 4 MG disintegrating tablet dissolve 1 tablet ON TONGUE every 8 hours if needed for nausea and vomiting 02/10/16   Mikey Kirschner, MD  pantoprazole (PROTONIX) 40 MG tablet Take 1  tablet (40 mg total) by mouth daily. 07/22/15   Mikey Kirschner, MD  potassium chloride (K-DUR) 10 MEQ tablet Take 2 tabs daily Patient taking differently: Take 20 mEq by mouth daily. Take 2 tabs daily 04/08/15   Mikey Kirschner, MD  QUEtiapine (SEROQUEL) 25 MG tablet Take 1 tablet (25 mg total) by mouth at bedtime. 08/02/15   Cameron Sprang, MD  QUEtiapine (SEROQUEL) 25 MG tablet take 1 tablet by mouth at bedtime 11/23/15   Mikey Kirschner, MD  [facility] HANDIHALER 18 MCG inhalation capsule Place 18 mcg into inhaler and inhale as needed (for shortness of breath).  11/14/14   Historical Provider, MD  traMADol Veatrice Bourbon) 50 MG tablet take 1 tablet by mouth every 6 hours if needed 11/15/15   Mikey Kirschner, MD  Vitamin D, Ergocalciferol, (DRISDOL) 50000 UNITS CAPS capsule Take 1 capsule (50,000 Units total) by mouth once a week. 06/14/15   Mikey Kirschner, MD    Family History Family History  Problem Relation Age of Onset  . Hypertension Mother   . Diabetes Mother   . Heart attack Mother   . Hyperlipidemia Sister     x3 sister  . Kidney disease Brother     Social History Social History  Substance Use Topics  . Smoking status: Former Smoker    Packs/day: 0.50    Years: 59.00    Types: Cigarettes  . Smokeless tobacco: Never Used     Comment: smoking since age [age]  . Alcohol use No     Allergies   Aleve [naproxen sodium]; Dilaudid  [hydromorphone hcl]; Dyazide [hydrochlorothiazide w-triamterene]; and Zithromax [azithromycin]   Review of Systems Review of Systems  Constitutional: Negative.   Respiratory: Negative.   Cardiovascular: Negative.   Gastrointestinal: Negative.   Musculoskeletal: Positive for arthralgias and gait problem.       Walks with walker  Skin: Negative.   Allergic/Immunologic: Positive for immunocompromised state.       Cancer patient. Last tetanus shot one year ago    Neurological: Positive for headaches.  Psychiatric/Behavioral: Negative.      Physical Exam Updated Vital Signs BP 121/58 (BP Location: Left Arm)   Pulse 86   Temp 98.7 F (37.1 C) (Oral)   Resp 18   Ht '5\' 7"'$  (1.702 m)   Wt 140 lb (63.5 kg)   SpO2 100%   BMI 21.93 kg/m   Physical Exam  Constitutional: She is oriented to person, place, and time.  frail, chronically ill-appearing  HENT:  Head: Normocephalic and atraumatic.  Eyes: Conjunctivae are normal. Pupils are equal, round, and reactive to light.  Neck: Neck supple. No tracheal deviation present. No thyromegaly present.  Cardiovascular: Normal rate and regular rhythm.   No murmur heard. Pulmonary/Chest: Effort normal and breath sounds normal.  Abdominal: Soft. Bowel sounds are normal. She exhibits no distension. There is no tenderness.  Musculoskeletal: Normal range of motion. She exhibits no edema or tenderness.  Right lower extremity dime-sized abrasion over anterior knee with mild corresponding tenderness. Hips are nontender. She has no pain on internal or external rotation of either thigh No deformity no swelling. Pelvis stable, nontender. Entire spine nontender. Bilateral upper extremities without deformity. She has mild pain of both shoulders on active motion.  Neurological: She is alert and oriented to person, place, and time. Coordination normal.  Moves all extremities  Skin: Skin is warm and dry. No rash noted.  Psychiatric: She has a normal mood and  affect.  Nursing  note and vitals reviewed.   ED Treatments / Results  Labs (all labs ordered are listed, but only abnormal results are displayed) Labs Reviewed - No data to display Results for orders placed or performed during the hospital encounter of 10/05/15  Culture, body fluid-bottle  Result Value Ref Range   Specimen Description FLUID GALL BLADDER    Special Requests BOTTLES DRAWN AEROBIC AND ANAEROBIC 10CC    Gram Stain      GRAM NEGATIVE RODS IN BOTH AEROBIC AND ANAEROBIC BOTTLES CRITICAL RESULT CALLED TO, READ BACK BY AND VERIFIED WITH: J [facility] AT 8366 10/08/15 BY L BENFIELD    Culture KLEBSIELLA PNEUMONIAE (A)    Report Status 10/10/2015 FINAL    Organism ID, Bacteria KLEBSIELLA PNEUMONIAE       Susceptibility   Klebsiella pneumoniae - MIC*    AMPICILLIN >=32 RESISTANT Resistant     CEFAZOLIN >=64 RESISTANT Resistant     CEFEPIME <=1 SENSITIVE Sensitive     CEFTAZIDIME <=1 SENSITIVE Sensitive     CEFTRIAXONE <=1 SENSITIVE Sensitive     CIPROFLOXACIN <=0.25 SENSITIVE Sensitive     GENTAMICIN <=1 SENSITIVE Sensitive     IMIPENEM <=0.25 SENSITIVE Sensitive     TRIMETH/SULFA <=20 SENSITIVE Sensitive     AMPICILLIN/SULBACTAM >=32 RESISTANT Resistant     PIP/TAZO >=128 RESISTANT Resistant     * KLEBSIELLA PNEUMONIAE  Gram stain  Result Value Ref Range   Specimen Description FLUID GALL BLADDER    Special Requests NONE    Gram Stain      ABUNDANT WBC PRESENT, PREDOMINANTLY PMN NO ORGANISMS SEEN    Report Status 10/07/2015 FINAL   CBC with Differential  Result Value Ref Range   WBC 7.7 4.0 - 10.5 K/uL   RBC 2.99 (L) 3.87 - 5.11 MIL/uL   Hemoglobin 10.1 (L) 12.0 - 15.0 g/dL   HCT 30.8 (L) 36.0 - 46.0 %   MCV 103.0 (H) 78.0 - 100.0 fL   MCH 33.8 26.0 - 34.0 pg   MCHC 32.8 30.0 - 36.0 g/dL   RDW 14.2 11.5 - 15.5 %   Platelets 332 150 - 400 K/uL   Neutrophils Relative % 80 %   Neutro Abs 6.2 1.7 - 7.7 K/uL   Lymphocytes Relative 10 %   Lymphs Abs 0.8 0.7 -  4.0 K/uL   Monocytes Relative 7 %   Monocytes Absolute 0.5 0.1 - 1.0 K/uL   Eosinophils Relative 3 %   Eosinophils Absolute 0.2 0.0 - 0.7 K/uL   Basophils Relative 0 %   Basophils Absolute 0.0 0.0 - 0.1 K/uL  Comprehensive metabolic panel  Result Value Ref Range   Sodium 139 135 - 145 mmol/L   Potassium 3.6 3.5 - 5.1 mmol/L   Chloride 105 101 - 111 mmol/L   CO2 24 22 - 32 mmol/L   Glucose, Bld 122 (H) 65 - 99 mg/dL   BUN 15 6 - 20 mg/dL   Creatinine, Ser 1.83 (H) 0.44 - 1.00 mg/dL   Calcium 9.3 8.9 - 10.3 mg/dL   Total Protein 7.1 6.5 - 8.1 g/dL   Albumin 4.3 3.5 - 5.0 g/dL   AST 26 15 - 41 U/L   ALT 10 (L) 14 - 54 U/L   Alkaline Phosphatase 36 (L) 38 - 126 U/L   Total Bilirubin 0.4 0.3 - 1.2 mg/dL   GFR calc non Af Amer 25 (L) >60 mL/min   GFR calc Af Amer 29 (L) >60 mL/min   Anion gap 10  5 - 15  Lipase, blood  Result Value Ref Range   Lipase 32 11 - 51 U/L  Urinalysis, Routine w reflex microscopic (not at [facility])  Result Value Ref Range   Color, Urine YELLOW YELLOW   APPearance HAZY (A) CLEAR   Specific Gravity, Urine 1.010 1.005 - 1.030   pH 5.5 5.0 - 8.0   Glucose, UA NEGATIVE NEGATIVE mg/dL   Hgb urine dipstick NEGATIVE NEGATIVE   Bilirubin Urine NEGATIVE NEGATIVE   Ketones, ur NEGATIVE NEGATIVE mg/dL   Protein, ur NEGATIVE NEGATIVE mg/dL   Nitrite NEGATIVE NEGATIVE   Leukocytes, UA TRACE (A) NEGATIVE  Urine microscopic-add on  Result Value Ref Range   Squamous Epithelial / LPF 0-5 (A) NONE SEEN   WBC, UA 6-30 0 - 5 WBC/hpf   RBC / HPF 0-5 0 - 5 RBC/hpf   Bacteria, UA FEW (A) NONE SEEN   Casts HYALINE CASTS (A) NEGATIVE  CBC  Result Value Ref Range   WBC 6.5 4.0 - 10.5 K/uL   RBC 2.89 (L) 3.87 - 5.11 MIL/uL   Hemoglobin 9.7 (L) 12.0 - 15.0 g/dL   HCT 29.7 (L) 36.0 - 46.0 %   MCV 102.8 (H) 78.0 - 100.0 fL   MCH 33.6 26.0 - 34.0 pg   MCHC 32.7 30.0 - 36.0 g/dL   RDW 14.2 11.5 - 15.5 %   Platelets 299 150 - 400 K/uL  Comprehensive metabolic panel  Result  Value Ref Range   Sodium 140 135 - 145 mmol/L   Potassium 3.6 3.5 - 5.1 mmol/L   Chloride 105 101 - 111 mmol/L   CO2 24 22 - 32 mmol/L   Glucose, Bld 99 65 - 99 mg/dL   BUN 11 6 - 20 mg/dL   Creatinine, Ser 1.66 (H) 0.44 - 1.00 mg/dL   Calcium 9.3 8.9 - 10.3 mg/dL   Total Protein 6.0 (L) 6.5 - 8.1 g/dL   Albumin 3.4 (L) 3.5 - 5.0 g/dL   AST 22 15 - 41 U/L   ALT 10 (L) 14 - 54 U/L   Alkaline Phosphatase 34 (L) 38 - 126 U/L   Total Bilirubin 0.8 0.3 - 1.2 mg/dL   GFR calc non Af Amer 28 (L) >60 mL/min   GFR calc Af Amer 33 (L) >60 mL/min   Anion gap 11 5 - 15  Comprehensive metabolic panel  Result Value Ref Range   Sodium 139 135 - 145 mmol/L   Potassium 3.9 3.5 - 5.1 mmol/L   Chloride 105 101 - 111 mmol/L   CO2 22 22 - 32 mmol/L   Glucose, Bld 80 65 - 99 mg/dL   BUN 12 6 - 20 mg/dL   Creatinine, Ser 1.67 (H) 0.44 - 1.00 mg/dL   Calcium 8.8 (L) 8.9 - 10.3 mg/dL   Total Protein 5.4 (L) 6.5 - 8.1 g/dL   Albumin 3.1 (L) 3.5 - 5.0 g/dL   AST 20 15 - 41 U/L   ALT 10 (L) 14 - 54 U/L   Alkaline Phosphatase 25 (L) 38 - 126 U/L   Total Bilirubin 0.8 0.3 - 1.2 mg/dL   GFR calc non Af Amer 28 (L) >60 mL/min   GFR calc Af Amer 33 (L) >60 mL/min   Anion gap 12 5 - 15  APTT  Result Value Ref Range   aPTT 39 (H) 24 - 37 seconds  Protime-INR  Result Value Ref Range   Prothrombin Time 15.7 (H) 11.6 - 15.2 seconds  INR 1.23 0.00 - 1.49  CBC  Result Value Ref Range   WBC 3.8 (L) 4.0 - 10.5 K/uL   RBC 2.70 (L) 3.87 - 5.11 MIL/uL   Hemoglobin 8.5 (L) 12.0 - 15.0 g/dL   HCT 27.2 (L) 36.0 - 46.0 %   MCV 100.7 (H) 78.0 - 100.0 fL   MCH 31.5 26.0 - 34.0 pg   MCHC 31.3 30.0 - 36.0 g/dL   RDW 13.9 11.5 - 15.5 %   Platelets 259 150 - 400 K/uL  Comprehensive metabolic panel  Result Value Ref Range   Sodium 140 135 - 145 mmol/L   Potassium 3.3 (L) 3.5 - 5.1 mmol/L   Chloride 107 101 - 111 mmol/L   CO2 24 22 - 32 mmol/L   Glucose, Bld 96 65 - 99 mg/dL   BUN 12 6 - 20 mg/dL    Creatinine, Ser 1.48 (H) 0.44 - 1.00 mg/dL   Calcium 9.2 8.9 - 10.3 mg/dL   Total Protein 5.4 (L) 6.5 - 8.1 g/dL   Albumin 2.8 (L) 3.5 - 5.0 g/dL   AST 19 15 - 41 U/L   ALT 8 (L) 14 - 54 U/L   Alkaline Phosphatase 23 (L) 38 - 126 U/L   Total Bilirubin 0.5 0.3 - 1.2 mg/dL   GFR calc non Af Amer 33 (L) >60 mL/min   GFR calc Af Amer 38 (L) >60 mL/min   Anion gap 9 5 - 15  Lipase, blood  Result Value Ref Range   Lipase 29 11 - 51 U/L  Glucose, capillary  Result Value Ref Range   Glucose-Capillary 139 (H) 65 - 99 mg/dL  Comprehensive metabolic panel  Result Value Ref Range   Sodium 141 135 - 145 mmol/L   Potassium 3.4 (L) 3.5 - 5.1 mmol/L   Chloride 109 101 - 111 mmol/L   CO2 23 22 - 32 mmol/L   Glucose, Bld 114 (H) 65 - 99 mg/dL   BUN 9 6 - 20 mg/dL   Creatinine, Ser 1.58 (H) 0.44 - 1.00 mg/dL   Calcium 9.3 8.9 - 10.3 mg/dL   Total Protein 5.7 (L) 6.5 - 8.1 g/dL   Albumin 2.8 (L) 3.5 - 5.0 g/dL   AST 21 15 - 41 U/L   ALT 9 (L) 14 - 54 U/L   Alkaline Phosphatase 24 (L) 38 - 126 U/L   Total Bilirubin 0.6 0.3 - 1.2 mg/dL   GFR calc non Af Amer 30 (L) >60 mL/min   GFR calc Af Amer 35 (L) >60 mL/min   Anion gap 9 5 - 15   Dg Shoulder Right  Result Date: 03/02/2016 CLINICAL DATA:  Fall today at home while getting up from the sofa. Bilateral shoulder pain. Initial encounter. EXAM: RIGHT SHOULDER - 2+ VIEW COMPARISON:  None. FINDINGS: No acute fracture or dislocation is identified. No focal osseous lesion is seen. A right jugular Port-A-Cath is noted. IMPRESSION: No acute osseous abnormality identified. Electronically Signed   By: Logan Bores M.D.   On: 03/02/2016 21:04   Ct Head Wo Contrast  Result Date: 03/02/2016 CLINICAL DATA:  Fall today. Hitting right side of head on floor. Unable to clear the cervical spine clinically. Initial encounter. EXAM: CT HEAD WITHOUT CONTRAST CT CERVICAL SPINE WITHOUT CONTRAST TECHNIQUE: Multidetector CT imaging of the head and cervical spine was  performed following the standard protocol without intravenous contrast. Multiplanar CT image reconstructions of the cervical spine were also generated. COMPARISON:  Head CT 08/16/2015 and  MRI 10/16/2014 FINDINGS: CT HEAD FINDINGS Brain: There is no evidence of acute cortical infarct, intracranial hemorrhage, mass, midline shift, or extra-axial fluid collection. Ventricles and sulci are within normal limits for age. Periventricular white matter hypodensities are unchanged and nonspecific but compatible with chronic small vessel ischemic disease, more conspicuous on MRI. Vascular: Calcified atherosclerosis at the skullbase. Skull: No fracture or focal osseous lesion. Sinuses/Orbits: Small left mastoid effusion. Partially visualized left maxillary sinus mucous retention cyst or polyp. Prior bilateral cataract extraction. Other: None. CT CERVICAL SPINE FINDINGS Alignment: Cervical spine straightening. No evidence of traumatic subluxation. Skull base and vertebrae: No acute fracture or destructive osseous lesion identified. Soft tissues and spinal canal: No prevertebral fluid or swelling. No visible canal hematoma. Right greater than left carotid bifurcation atherosclerosis. Disc levels: Disc space narrowing is moderate to severe at C3-4 and moderate at C4-5 with degenerative endplate spurring. A broad-based posterior disc osteophyte complex at C3-4 results in likely moderate spinal stenosis, and there is evidence of mild spinal stenosis at C4-5 and C5-6. There is moderate to severe bilateral neural foraminal stenosis at C3-4. Upper chest: Centrilobular emphysema and mild scarring in the lung apices. Partially visualized right jugular venous catheter. Other: None. IMPRESSION: 1. No evidence of acute intracranial abnormality. 2. Chronic small vessel ischemic disease. 3. No acute osseous abnormality identified in the cervical spine. 4. Multilevel cervical disc degeneration, worst at C3-4. Electronically Signed   By: Logan Bores M.D.   On: 03/02/2016 20:51   Ct Cervical Spine Wo Contrast  Result Date: 03/02/2016 CLINICAL DATA:  Fall today. Hitting right side of head on floor. Unable to clear the cervical spine clinically. Initial encounter. EXAM: CT HEAD WITHOUT CONTRAST CT CERVICAL SPINE WITHOUT CONTRAST TECHNIQUE: Multidetector CT imaging of the head and cervical spine was performed following the standard protocol without intravenous contrast. Multiplanar CT image reconstructions of the cervical spine were also generated. COMPARISON:  Head CT 08/16/2015 and MRI 10/16/2014 FINDINGS: CT HEAD FINDINGS Brain: There is no evidence of acute cortical infarct, intracranial hemorrhage, mass, midline shift, or extra-axial fluid collection. Ventricles and sulci are within normal limits for age. Periventricular white matter hypodensities are unchanged and nonspecific but compatible with chronic small vessel ischemic disease, more conspicuous on MRI. Vascular: Calcified atherosclerosis at the skullbase. Skull: No fracture or focal osseous lesion. Sinuses/Orbits: Small left mastoid effusion. Partially visualized left maxillary sinus mucous retention cyst or polyp. Prior bilateral cataract extraction. Other: None. CT CERVICAL SPINE FINDINGS Alignment: Cervical spine straightening. No evidence of traumatic subluxation. Skull base and vertebrae: No acute fracture or destructive osseous lesion identified. Soft tissues and spinal canal: No prevertebral fluid or swelling. No visible canal hematoma. Right greater than left carotid bifurcation atherosclerosis. Disc levels: Disc space narrowing is moderate to severe at C3-4 and moderate at C4-5 with degenerative endplate spurring. A broad-based posterior disc osteophyte complex at C3-4 results in likely moderate spinal stenosis, and there is evidence of mild spinal stenosis at C4-5 and C5-6. There is moderate to severe bilateral neural foraminal stenosis at C3-4. Upper chest: Centrilobular emphysema  and mild scarring in the lung apices. Partially visualized right jugular venous catheter. Other: None. IMPRESSION: 1. No evidence of acute intracranial abnormality. 2. Chronic small vessel ischemic disease. 3. No acute osseous abnormality identified in the cervical spine. 4. Multilevel cervical disc degeneration, worst at C3-4. Electronically Signed   By: Logan Bores M.D.   On: 03/02/2016 20:51   Dg Shoulder Left  Result Date: 03/02/2016 CLINICAL DATA:  [age]-year-old female with fall and bilateral shoulder pain. EXAM: LEFT SHOULDER - 2+ VIEW COMPARISON:  Left shoulder radiograph dated 09/15/2005 FINDINGS: There is no acute fracture or dislocation. The bones are osteopenic. There is degenerative changes of the left AC joint with bone spurring and osteophyte formation along the undersurface of the joint. The soft tissues appear unremarkable. No radiopaque foreign object identified. IMPRESSION: No acute fracture or dislocation. Degenerative changes and osteophyte formation at the [facility] joint. Electronically Signed   By: Anner Crete M.D.   On: 03/02/2016 21:04   Dg Knee Complete 4 Views Right  Result Date: 03/02/2016 CLINICAL DATA:  Fall today at home while getting up from the sofa. Right knee pain. Initial encounter. EXAM: RIGHT KNEE - COMPLETE 4+ VIEW COMPARISON:  None. FINDINGS: Chondrocalcinosis is noted in the medial and lateral compartments. There is mild medial compartment joint space narrowing. No acute fracture, dislocation, or knee joint effusion is identified. There is tricompartmental osteophytosis scratched of there is mild-to-moderate tricompartmental osteophytosis. A superior patellar enthesophyte is noted. Stippled intra medullary calcification extending over a length of approximately 12 cm in the distal diaphysis of the femur may represent a chondroid lesion (enchondroma) or possibly old infarct. Vascular calcifications are noted. IMPRESSION: 1. No acute osseous abnormality identified. 2.  Chondrocalcinosis. 3. Distal left femoral lesion as above. Electronically Signed   By: Logan Bores M.D.   On: 03/02/2016 21:12   Dg Hip Unilat With Pelvis 2-3 Views Left  Result Date: 03/02/2016 CLINICAL DATA:  Fall today in home while getting up from the sofa. Bilateral shoulder pain, left hip pain, and right knee pain. Denies previous surgery to any examined area. EXAM: DG HIP (WITH OR WITHOUT PELVIS) 2-3V LEFT COMPARISON:  None FINDINGS: Mild degenerative spurring of both hips with mild axial loss of articular space in both hips. Varies clips project in the abdomen and pelvis. Lower lumbar spondylosis. No pelvic fracture or hip fracture identified. IMPRESSION: 1. No acute bony findings are identified. 2. Mild degenerative arthropathy of both hips. Lower lumbar spondylosis. Electronically Signed   By: Van Clines M.D.   On: 03/02/2016 21:07   EKG  EKG Interpretation None       Radiology No results found.  Procedures Procedures (including critical care time)  Medications Ordered in ED Medications - No data to display   Initial Impression / Assessment and Plan / ED Course  I have reviewed the triage vital signs and the nursing notes.  Pertinent labs & imaging results that were available during my care of the patient were reviewed by me and considered in my medical decision making (see chart for details).  Clinical Course   9:40 PM patient ambulates with minimal assistance. Declines pain medicine Antibiotic ointment and Band-Aid ordered for abrasion to right knee X-rays viewed by me Final Clinical Impressions(s) / ED Diagnoses  Plan Tylenol as needed for pain. Local wound care to abrasion right knee. Home observation Diagnosis #1 fall #2 multiple arthralgias #3 abrasion of right knee Final diagnoses:  None  #4Minor closed head trauma  New Prescriptions New Prescriptions   No medications on file     Orlie Dakin, MD 03/02/16 2143    Orlie Dakin,  MD 03/02/16 1540    Orlie Dakin, MD 03/02/16 2144

## 2016-03-02 NOTE — Discharge Instructions (Signed)
X-rays and CAT scan showed nothing broken. No brain injury. You can take Tylenol as directed for pain. Apply a thin layer of bacitracin ointment to the abrasion on your right knee daily and cover with a Band-Aid. Signs of infection include redness around the wound, swelling, drainage from the wound, fever or more pain.

## 2016-03-03 ENCOUNTER — Telehealth: Payer: Self-pay | Admitting: Family Medicine

## 2016-03-03 DIAGNOSIS — R531 Weakness: Secondary | ICD-10-CM

## 2016-03-03 DIAGNOSIS — R2681 Unsteadiness on feet: Secondary | ICD-10-CM

## 2016-03-03 NOTE — Telephone Encounter (Signed)
Mardene Celeste notified and transferred to front desk to schedule appointment.

## 2016-03-03 NOTE — Telephone Encounter (Signed)
Lets do first request  Ov next wk to discuss hospice

## 2016-03-03 NOTE — Telephone Encounter (Signed)
Left message on voicemail notifying Hassell Halim to return call. (Referral in system)

## 2016-03-03 NOTE — Telephone Encounter (Signed)
Daughter would like [place] to order more home health for nursing care & more PT   Feels like pt needs more strengthening  Recent fall & seen at ER  Also would your thoughts or recommendation on Hospice care   Please advise or NTBS to discuss

## 2016-03-04 ENCOUNTER — Other Ambulatory Visit: Payer: Self-pay | Admitting: Family Medicine

## 2016-03-08 ENCOUNTER — Ambulatory Visit (INDEPENDENT_AMBULATORY_CARE_PROVIDER_SITE_OTHER): Payer: Medicare Other | Admitting: Family Medicine

## 2016-03-08 VITALS — Ht 67.0 in

## 2016-03-08 DIAGNOSIS — F03B0 Unspecified dementia, moderate, without behavioral disturbance, psychotic disturbance, mood disturbance, and anxiety: Secondary | ICD-10-CM

## 2016-03-08 DIAGNOSIS — C3492 Malignant neoplasm of unspecified part of left bronchus or lung: Secondary | ICD-10-CM | POA: Diagnosis not present

## 2016-03-08 DIAGNOSIS — J449 Chronic obstructive pulmonary disease, unspecified: Secondary | ICD-10-CM | POA: Diagnosis not present

## 2016-03-08 DIAGNOSIS — F0390 Unspecified dementia without behavioral disturbance: Secondary | ICD-10-CM

## 2016-03-08 NOTE — Progress Notes (Signed)
   Subjective:    Patient ID: [name], female    DOB: 02/04/1935, 81 y.o.   MRN: [id number]  HPI Long discussion was held with patient's daughter today. Our patient's appetite has dropped off remarkably. She is losing weight. She has a drain into the gallbladder. She is not a candidate for surgery. X  She is expressing progressive chest pain with her metastatic cancer. She is under pain medication from the oncologist for this.  Patient wants no more dramatic and/or attempts at therapeutic intervention for her cancer.  Family and patient has talked it over and they would like to press on with hospice evaluation and referral Patient daughter [name] is in today on behalf of patient to discuss a Hospice consult.   [address]   Dementia worsening  Recent tumble with  Mega xray w u  Mobility worsie  Now hs gb tube sec to not a candiate for surg  Cancer now worsening  incr pain   Review of Systems Diminished appetite losing weight. Ongoing pain. Managed by the oncologist.    Objective:   Physical Exam  Not examined today      Assessment & Plan:  Impression metastatic lung cancer #2 worsening dementia #3 increased frailty #4 progressive weight loss #5 worsening pain #6 worsening quality of life #7 patient desires not to continue with life saving interventions or attempts at cure. For all these reasons we will press on into hospice referral. There is good rationale for this. Easily 25 minutes spent most in discussion WSL

## 2016-03-13 ENCOUNTER — Ambulatory Visit (HOSPITAL_COMMUNITY): Payer: Medicare Other | Admitting: Hematology & Oncology

## 2016-03-13 DIAGNOSIS — E039 Hypothyroidism, unspecified: Secondary | ICD-10-CM | POA: Diagnosis not present

## 2016-03-13 DIAGNOSIS — R5383 Other fatigue: Secondary | ICD-10-CM | POA: Diagnosis not present

## 2016-03-13 DIAGNOSIS — R52 Pain, unspecified: Secondary | ICD-10-CM | POA: Diagnosis not present

## 2016-03-13 DIAGNOSIS — R110 Nausea: Secondary | ICD-10-CM | POA: Diagnosis not present

## 2016-03-13 DIAGNOSIS — R630 Anorexia: Secondary | ICD-10-CM | POA: Diagnosis not present

## 2016-03-13 DIAGNOSIS — K219 Gastro-esophageal reflux disease without esophagitis: Secondary | ICD-10-CM | POA: Diagnosis not present

## 2016-03-13 DIAGNOSIS — F0150 Vascular dementia without behavioral disturbance: Secondary | ICD-10-CM | POA: Diagnosis not present

## 2016-03-13 DIAGNOSIS — R531 Weakness: Secondary | ICD-10-CM | POA: Diagnosis not present

## 2016-03-13 DIAGNOSIS — I519 Heart disease, unspecified: Secondary | ICD-10-CM | POA: Diagnosis not present

## 2016-03-13 DIAGNOSIS — I10 Essential (primary) hypertension: Secondary | ICD-10-CM | POA: Diagnosis not present

## 2016-03-13 DIAGNOSIS — C3492 Malignant neoplasm of unspecified part of left bronchus or lung: Secondary | ICD-10-CM | POA: Diagnosis not present

## 2016-03-14 NOTE — Progress Notes (Signed)
This encounter was created in error - please disregard.

## 2016-03-16 DIAGNOSIS — C3492 Malignant neoplasm of unspecified part of left bronchus or lung: Secondary | ICD-10-CM | POA: Diagnosis not present

## 2016-03-16 DIAGNOSIS — K219 Gastro-esophageal reflux disease without esophagitis: Secondary | ICD-10-CM | POA: Diagnosis not present

## 2016-03-16 DIAGNOSIS — I10 Essential (primary) hypertension: Secondary | ICD-10-CM | POA: Diagnosis not present

## 2016-03-16 DIAGNOSIS — I519 Heart disease, unspecified: Secondary | ICD-10-CM | POA: Diagnosis not present

## 2016-03-16 DIAGNOSIS — F0150 Vascular dementia without behavioral disturbance: Secondary | ICD-10-CM | POA: Diagnosis not present

## 2016-03-16 DIAGNOSIS — E039 Hypothyroidism, unspecified: Secondary | ICD-10-CM | POA: Diagnosis not present

## 2016-03-19 ENCOUNTER — Other Ambulatory Visit: Payer: Self-pay | Admitting: Family Medicine

## 2016-03-20 DIAGNOSIS — E039 Hypothyroidism, unspecified: Secondary | ICD-10-CM | POA: Diagnosis not present

## 2016-03-20 DIAGNOSIS — I10 Essential (primary) hypertension: Secondary | ICD-10-CM | POA: Diagnosis not present

## 2016-03-20 DIAGNOSIS — C3492 Malignant neoplasm of unspecified part of left bronchus or lung: Secondary | ICD-10-CM | POA: Diagnosis not present

## 2016-03-20 DIAGNOSIS — F0150 Vascular dementia without behavioral disturbance: Secondary | ICD-10-CM | POA: Diagnosis not present

## 2016-03-20 DIAGNOSIS — I519 Heart disease, unspecified: Secondary | ICD-10-CM | POA: Diagnosis not present

## 2016-03-20 DIAGNOSIS — K219 Gastro-esophageal reflux disease without esophagitis: Secondary | ICD-10-CM | POA: Diagnosis not present

## 2016-03-22 ENCOUNTER — Other Ambulatory Visit (HOSPITAL_COMMUNITY): Payer: Medicare Other

## 2016-03-22 DIAGNOSIS — K219 Gastro-esophageal reflux disease without esophagitis: Secondary | ICD-10-CM | POA: Diagnosis not present

## 2016-03-22 DIAGNOSIS — E039 Hypothyroidism, unspecified: Secondary | ICD-10-CM | POA: Diagnosis not present

## 2016-03-22 DIAGNOSIS — I10 Essential (primary) hypertension: Secondary | ICD-10-CM | POA: Diagnosis not present

## 2016-03-22 DIAGNOSIS — I519 Heart disease, unspecified: Secondary | ICD-10-CM | POA: Diagnosis not present

## 2016-03-22 DIAGNOSIS — F0150 Vascular dementia without behavioral disturbance: Secondary | ICD-10-CM | POA: Diagnosis not present

## 2016-03-22 DIAGNOSIS — C3492 Malignant neoplasm of unspecified part of left bronchus or lung: Secondary | ICD-10-CM | POA: Diagnosis not present

## 2016-03-26 DIAGNOSIS — I519 Heart disease, unspecified: Secondary | ICD-10-CM | POA: Diagnosis not present

## 2016-03-26 DIAGNOSIS — E039 Hypothyroidism, unspecified: Secondary | ICD-10-CM | POA: Diagnosis not present

## 2016-03-26 DIAGNOSIS — I10 Essential (primary) hypertension: Secondary | ICD-10-CM | POA: Diagnosis not present

## 2016-03-26 DIAGNOSIS — F0150 Vascular dementia without behavioral disturbance: Secondary | ICD-10-CM | POA: Diagnosis not present

## 2016-03-26 DIAGNOSIS — R630 Anorexia: Secondary | ICD-10-CM | POA: Diagnosis not present

## 2016-03-26 DIAGNOSIS — R531 Weakness: Secondary | ICD-10-CM | POA: Diagnosis not present

## 2016-03-26 DIAGNOSIS — R52 Pain, unspecified: Secondary | ICD-10-CM | POA: Diagnosis not present

## 2016-03-26 DIAGNOSIS — R110 Nausea: Secondary | ICD-10-CM | POA: Diagnosis not present

## 2016-03-26 DIAGNOSIS — K219 Gastro-esophageal reflux disease without esophagitis: Secondary | ICD-10-CM | POA: Diagnosis not present

## 2016-03-26 DIAGNOSIS — C3492 Malignant neoplasm of unspecified part of left bronchus or lung: Secondary | ICD-10-CM | POA: Diagnosis not present

## 2016-03-26 DIAGNOSIS — R5383 Other fatigue: Secondary | ICD-10-CM | POA: Diagnosis not present

## 2016-03-27 DIAGNOSIS — K219 Gastro-esophageal reflux disease without esophagitis: Secondary | ICD-10-CM | POA: Diagnosis not present

## 2016-03-27 DIAGNOSIS — F0150 Vascular dementia without behavioral disturbance: Secondary | ICD-10-CM | POA: Diagnosis not present

## 2016-03-27 DIAGNOSIS — C3492 Malignant neoplasm of unspecified part of left bronchus or lung: Secondary | ICD-10-CM | POA: Diagnosis not present

## 2016-03-27 DIAGNOSIS — I10 Essential (primary) hypertension: Secondary | ICD-10-CM | POA: Diagnosis not present

## 2016-03-27 DIAGNOSIS — I519 Heart disease, unspecified: Secondary | ICD-10-CM | POA: Diagnosis not present

## 2016-03-27 DIAGNOSIS — E039 Hypothyroidism, unspecified: Secondary | ICD-10-CM | POA: Diagnosis not present

## 2016-03-29 ENCOUNTER — Telehealth: Payer: Self-pay | Admitting: *Deleted

## 2016-03-29 DIAGNOSIS — E039 Hypothyroidism, unspecified: Secondary | ICD-10-CM | POA: Diagnosis not present

## 2016-03-29 DIAGNOSIS — I519 Heart disease, unspecified: Secondary | ICD-10-CM | POA: Diagnosis not present

## 2016-03-29 DIAGNOSIS — C3492 Malignant neoplasm of unspecified part of left bronchus or lung: Secondary | ICD-10-CM | POA: Diagnosis not present

## 2016-03-29 DIAGNOSIS — F0150 Vascular dementia without behavioral disturbance: Secondary | ICD-10-CM | POA: Diagnosis not present

## 2016-03-29 DIAGNOSIS — I10 Essential (primary) hypertension: Secondary | ICD-10-CM | POA: Diagnosis not present

## 2016-03-29 DIAGNOSIS — K219 Gastro-esophageal reflux disease without esophagitis: Secondary | ICD-10-CM | POA: Diagnosis not present

## 2016-03-29 NOTE — Telephone Encounter (Signed)
seroquel 50 qhs

## 2016-03-29 NOTE — Telephone Encounter (Signed)
[place] nurse from Hospice wants to change xanax to their standing order which is xanax '1mg'$  every 3 hours prn and also wants to increase seroquel due to Insomnia and increased anxiety. Pt currently takes seroquel '25mg'$  qhs.

## 2016-03-29 NOTE — Telephone Encounter (Signed)
What do you want to increase the seroquel to.

## 2016-03-29 NOTE — Telephone Encounter (Signed)
ok 

## 2016-03-30 ENCOUNTER — Other Ambulatory Visit: Payer: Self-pay | Admitting: *Deleted

## 2016-03-30 MED ORDER — QUETIAPINE FUMARATE 50 MG PO TABS: 50.0000 mg | ORAL_TABLET | Freq: Every day | ORAL | 5 refills | Status: DC

## 2016-03-30 MED ORDER — ALPRAZOLAM 1 MG PO TABS: ORAL_TABLET | ORAL | 5 refills | Status: DC

## 2016-03-30 NOTE — Telephone Encounter (Signed)
Left message with receptionist to have [name] return call. Med sent to [facility].

## 2016-03-30 NOTE — Telephone Encounter (Signed)
Discussed with hospice nurse. meds sent to [place] [facility] to deliver to pt's home

## 2016-03-31 ENCOUNTER — Other Ambulatory Visit (HOSPITAL_COMMUNITY): Payer: Medicare Other

## 2016-04-03 DIAGNOSIS — C3492 Malignant neoplasm of unspecified part of left bronchus or lung: Secondary | ICD-10-CM | POA: Diagnosis not present

## 2016-04-03 DIAGNOSIS — I519 Heart disease, unspecified: Secondary | ICD-10-CM | POA: Diagnosis not present

## 2016-04-03 DIAGNOSIS — E039 Hypothyroidism, unspecified: Secondary | ICD-10-CM | POA: Diagnosis not present

## 2016-04-03 DIAGNOSIS — F0150 Vascular dementia without behavioral disturbance: Secondary | ICD-10-CM | POA: Diagnosis not present

## 2016-04-03 DIAGNOSIS — K219 Gastro-esophageal reflux disease without esophagitis: Secondary | ICD-10-CM | POA: Diagnosis not present

## 2016-04-03 DIAGNOSIS — I10 Essential (primary) hypertension: Secondary | ICD-10-CM | POA: Diagnosis not present

## 2016-04-04 ENCOUNTER — Other Ambulatory Visit (HOSPITAL_COMMUNITY): Payer: Medicare Other

## 2016-04-06 ENCOUNTER — Encounter (HOSPITAL_COMMUNITY): Payer: Self-pay | Admitting: Interventional Radiology

## 2016-04-06 ENCOUNTER — Ambulatory Visit (HOSPITAL_COMMUNITY)
Admission: RE | Admit: 2016-04-06 | Discharge: 2016-04-06 | Disposition: A | Discharge status: Home / Self Care | Payer: Medicare Other | Source: Ambulatory Visit | Attending: Radiology | Admitting: Radiology

## 2016-04-06 DIAGNOSIS — K8010 Calculus of gallbladder with chronic cholecystitis without obstruction: Secondary | ICD-10-CM | POA: Diagnosis not present

## 2016-04-06 DIAGNOSIS — Z4803 Encounter for change or removal of drains: Secondary | ICD-10-CM | POA: Insufficient documentation

## 2016-04-06 DIAGNOSIS — K811 Chronic cholecystitis: Secondary | ICD-10-CM | POA: Insufficient documentation

## 2016-04-06 DIAGNOSIS — K810 Acute cholecystitis: Secondary | ICD-10-CM

## 2016-04-06 HISTORY — PX: IR GENERIC HISTORICAL: IMG1180011

## 2016-04-06 MED ORDER — LIDOCAINE HCL 1 % IJ SOLN
INTRAMUSCULAR | Status: DC | PRN
  Administered 2016-04-06: 5 mL

## 2016-04-06 MED ORDER — LIDOCAINE HCL 1 % IJ SOLN
INTRAMUSCULAR | Status: AC
  Filled 2016-04-06: qty 20

## 2016-04-06 MED ORDER — IOPAMIDOL (ISOVUE-300) INJECTION 61%
5.0000 mL | Freq: Once | INTRAVENOUS | Status: AC | PRN
Start: 2016-04-06 — End: 2016-04-06
  Administered 2016-04-06: 5 mL

## 2016-04-06 NOTE — Procedures (Signed)
S/p chole drian exchg  No comp Stable Full report in pacs

## 2016-04-11 DIAGNOSIS — I519 Heart disease, unspecified: Secondary | ICD-10-CM | POA: Diagnosis not present

## 2016-04-11 DIAGNOSIS — F0150 Vascular dementia without behavioral disturbance: Secondary | ICD-10-CM | POA: Diagnosis not present

## 2016-04-11 DIAGNOSIS — C3492 Malignant neoplasm of unspecified part of left bronchus or lung: Secondary | ICD-10-CM | POA: Diagnosis not present

## 2016-04-11 DIAGNOSIS — E039 Hypothyroidism, unspecified: Secondary | ICD-10-CM | POA: Diagnosis not present

## 2016-04-11 DIAGNOSIS — I10 Essential (primary) hypertension: Secondary | ICD-10-CM | POA: Diagnosis not present

## 2016-04-11 DIAGNOSIS — K219 Gastro-esophageal reflux disease without esophagitis: Secondary | ICD-10-CM | POA: Diagnosis not present

## 2016-04-13 DIAGNOSIS — I10 Essential (primary) hypertension: Secondary | ICD-10-CM | POA: Diagnosis not present

## 2016-04-13 DIAGNOSIS — K219 Gastro-esophageal reflux disease without esophagitis: Secondary | ICD-10-CM | POA: Diagnosis not present

## 2016-04-13 DIAGNOSIS — E039 Hypothyroidism, unspecified: Secondary | ICD-10-CM | POA: Diagnosis not present

## 2016-04-13 DIAGNOSIS — I519 Heart disease, unspecified: Secondary | ICD-10-CM | POA: Diagnosis not present

## 2016-04-13 DIAGNOSIS — F0150 Vascular dementia without behavioral disturbance: Secondary | ICD-10-CM | POA: Diagnosis not present

## 2016-04-13 DIAGNOSIS — C3492 Malignant neoplasm of unspecified part of left bronchus or lung: Secondary | ICD-10-CM | POA: Diagnosis not present

## 2016-04-17 DIAGNOSIS — C3492 Malignant neoplasm of unspecified part of left bronchus or lung: Secondary | ICD-10-CM | POA: Diagnosis not present

## 2016-04-17 DIAGNOSIS — F0150 Vascular dementia without behavioral disturbance: Secondary | ICD-10-CM | POA: Diagnosis not present

## 2016-04-17 DIAGNOSIS — E039 Hypothyroidism, unspecified: Secondary | ICD-10-CM | POA: Diagnosis not present

## 2016-04-17 DIAGNOSIS — K219 Gastro-esophageal reflux disease without esophagitis: Secondary | ICD-10-CM | POA: Diagnosis not present

## 2016-04-17 DIAGNOSIS — I10 Essential (primary) hypertension: Secondary | ICD-10-CM | POA: Diagnosis not present

## 2016-04-17 DIAGNOSIS — I519 Heart disease, unspecified: Secondary | ICD-10-CM | POA: Diagnosis not present

## 2016-04-18 ENCOUNTER — Other Ambulatory Visit: Payer: Self-pay | Admitting: Family Medicine

## 2016-04-21 ENCOUNTER — Ambulatory Visit: Payer: Medicare Other | Admitting: Family Medicine

## 2016-04-21 DIAGNOSIS — C3492 Malignant neoplasm of unspecified part of left bronchus or lung: Secondary | ICD-10-CM | POA: Diagnosis not present

## 2016-04-21 DIAGNOSIS — K219 Gastro-esophageal reflux disease without esophagitis: Secondary | ICD-10-CM | POA: Diagnosis not present

## 2016-04-21 DIAGNOSIS — F0150 Vascular dementia without behavioral disturbance: Secondary | ICD-10-CM | POA: Diagnosis not present

## 2016-04-21 DIAGNOSIS — E039 Hypothyroidism, unspecified: Secondary | ICD-10-CM | POA: Diagnosis not present

## 2016-04-21 DIAGNOSIS — I519 Heart disease, unspecified: Secondary | ICD-10-CM | POA: Diagnosis not present

## 2016-04-21 DIAGNOSIS — I10 Essential (primary) hypertension: Secondary | ICD-10-CM | POA: Diagnosis not present

## 2016-04-24 DIAGNOSIS — E039 Hypothyroidism, unspecified: Secondary | ICD-10-CM | POA: Diagnosis not present

## 2016-04-24 DIAGNOSIS — I10 Essential (primary) hypertension: Secondary | ICD-10-CM | POA: Diagnosis not present

## 2016-04-24 DIAGNOSIS — K219 Gastro-esophageal reflux disease without esophagitis: Secondary | ICD-10-CM | POA: Diagnosis not present

## 2016-04-24 DIAGNOSIS — F0150 Vascular dementia without behavioral disturbance: Secondary | ICD-10-CM | POA: Diagnosis not present

## 2016-04-24 DIAGNOSIS — I519 Heart disease, unspecified: Secondary | ICD-10-CM | POA: Diagnosis not present

## 2016-04-24 DIAGNOSIS — C3492 Malignant neoplasm of unspecified part of left bronchus or lung: Secondary | ICD-10-CM | POA: Diagnosis not present

## 2016-04-25 ENCOUNTER — Telehealth: Payer: Self-pay | Admitting: *Deleted

## 2016-04-25 DIAGNOSIS — Z23 Encounter for immunization: Secondary | ICD-10-CM | POA: Diagnosis not present

## 2016-04-25 DIAGNOSIS — C3492 Malignant neoplasm of unspecified part of left bronchus or lung: Secondary | ICD-10-CM | POA: Diagnosis not present

## 2016-04-25 DIAGNOSIS — E039 Hypothyroidism, unspecified: Secondary | ICD-10-CM | POA: Diagnosis not present

## 2016-04-25 DIAGNOSIS — I10 Essential (primary) hypertension: Secondary | ICD-10-CM | POA: Diagnosis not present

## 2016-04-25 DIAGNOSIS — F0150 Vascular dementia without behavioral disturbance: Secondary | ICD-10-CM | POA: Diagnosis not present

## 2016-04-25 DIAGNOSIS — K219 Gastro-esophageal reflux disease without esophagitis: Secondary | ICD-10-CM | POA: Diagnosis not present

## 2016-04-25 DIAGNOSIS — I519 Heart disease, unspecified: Secondary | ICD-10-CM | POA: Diagnosis not present

## 2016-04-25 NOTE — Telephone Encounter (Signed)
[name] from hospice wants rx for flu vaccine to be sent to [place] [facility] to deliver to pt's home. Script written and faxed to [facility]

## 2016-04-26 DIAGNOSIS — R110 Nausea: Secondary | ICD-10-CM | POA: Diagnosis not present

## 2016-04-26 DIAGNOSIS — R5383 Other fatigue: Secondary | ICD-10-CM | POA: Diagnosis not present

## 2016-04-26 DIAGNOSIS — I10 Essential (primary) hypertension: Secondary | ICD-10-CM | POA: Diagnosis not present

## 2016-04-26 DIAGNOSIS — K219 Gastro-esophageal reflux disease without esophagitis: Secondary | ICD-10-CM | POA: Diagnosis not present

## 2016-04-26 DIAGNOSIS — I519 Heart disease, unspecified: Secondary | ICD-10-CM | POA: Diagnosis not present

## 2016-04-26 DIAGNOSIS — E039 Hypothyroidism, unspecified: Secondary | ICD-10-CM | POA: Diagnosis not present

## 2016-04-26 DIAGNOSIS — R630 Anorexia: Secondary | ICD-10-CM | POA: Diagnosis not present

## 2016-04-26 DIAGNOSIS — F0150 Vascular dementia without behavioral disturbance: Secondary | ICD-10-CM | POA: Diagnosis not present

## 2016-04-26 DIAGNOSIS — C3492 Malignant neoplasm of unspecified part of left bronchus or lung: Secondary | ICD-10-CM | POA: Diagnosis not present

## 2016-04-26 DIAGNOSIS — R52 Pain, unspecified: Secondary | ICD-10-CM | POA: Diagnosis not present

## 2016-04-26 DIAGNOSIS — R531 Weakness: Secondary | ICD-10-CM | POA: Diagnosis not present

## 2016-04-27 ENCOUNTER — Telehealth: Payer: Self-pay | Admitting: Family Medicine

## 2016-04-27 DIAGNOSIS — K219 Gastro-esophageal reflux disease without esophagitis: Secondary | ICD-10-CM | POA: Diagnosis not present

## 2016-04-27 DIAGNOSIS — I10 Essential (primary) hypertension: Secondary | ICD-10-CM | POA: Diagnosis not present

## 2016-04-27 DIAGNOSIS — F0150 Vascular dementia without behavioral disturbance: Secondary | ICD-10-CM | POA: Diagnosis not present

## 2016-04-27 DIAGNOSIS — C3492 Malignant neoplasm of unspecified part of left bronchus or lung: Secondary | ICD-10-CM | POA: Diagnosis not present

## 2016-04-27 DIAGNOSIS — E039 Hypothyroidism, unspecified: Secondary | ICD-10-CM | POA: Diagnosis not present

## 2016-04-27 DIAGNOSIS — I519 Heart disease, unspecified: Secondary | ICD-10-CM | POA: Diagnosis not present

## 2016-04-27 NOTE — Telephone Encounter (Signed)
Etheleen Mayhew, Hospice nurse called to ask for 3 things  1.  Pt is complaining of leg cramps at night.  She's doing pretty well, vitals are good, alert, just some increased weakness.  Wonders if they can get a order for Magnesium to try to help with her nightly leg cramps.  2.  Pt is having increased anxiety, wonders if we can increase pt's ALPRAZolam (XANAX) 1 MG tablet to 4 times a day  3.  Nurse needs order to change dry bandage on pt's J tube that was placed in her gallbladder.  She's been changing it twice a week.  Just needs a doctor's order to change the bandage   Please call Otila Kluver [phone]

## 2016-04-27 NOTE — Telephone Encounter (Signed)
Message below orders are per dr Richardson Landry.

## 2016-04-27 NOTE — Telephone Encounter (Signed)
Can take magnessium 2 at night, can have order to increase xanax one mg to one qid, and can have order to change bandage on J tube. Left message giving verbal orders.

## 2016-05-01 DIAGNOSIS — C3492 Malignant neoplasm of unspecified part of left bronchus or lung: Secondary | ICD-10-CM | POA: Diagnosis not present

## 2016-05-01 DIAGNOSIS — I519 Heart disease, unspecified: Secondary | ICD-10-CM | POA: Diagnosis not present

## 2016-05-01 DIAGNOSIS — E039 Hypothyroidism, unspecified: Secondary | ICD-10-CM | POA: Diagnosis not present

## 2016-05-01 DIAGNOSIS — I10 Essential (primary) hypertension: Secondary | ICD-10-CM | POA: Diagnosis not present

## 2016-05-01 DIAGNOSIS — K219 Gastro-esophageal reflux disease without esophagitis: Secondary | ICD-10-CM | POA: Diagnosis not present

## 2016-05-01 DIAGNOSIS — F0150 Vascular dementia without behavioral disturbance: Secondary | ICD-10-CM | POA: Diagnosis not present

## 2016-05-04 DIAGNOSIS — I519 Heart disease, unspecified: Secondary | ICD-10-CM | POA: Diagnosis not present

## 2016-05-04 DIAGNOSIS — F0150 Vascular dementia without behavioral disturbance: Secondary | ICD-10-CM | POA: Diagnosis not present

## 2016-05-04 DIAGNOSIS — C3492 Malignant neoplasm of unspecified part of left bronchus or lung: Secondary | ICD-10-CM | POA: Diagnosis not present

## 2016-05-04 DIAGNOSIS — K219 Gastro-esophageal reflux disease without esophagitis: Secondary | ICD-10-CM | POA: Diagnosis not present

## 2016-05-04 DIAGNOSIS — I10 Essential (primary) hypertension: Secondary | ICD-10-CM | POA: Diagnosis not present

## 2016-05-04 DIAGNOSIS — E039 Hypothyroidism, unspecified: Secondary | ICD-10-CM | POA: Diagnosis not present

## 2016-05-08 ENCOUNTER — Other Ambulatory Visit: Payer: Self-pay | Admitting: Family Medicine

## 2016-05-08 DIAGNOSIS — K219 Gastro-esophageal reflux disease without esophagitis: Secondary | ICD-10-CM | POA: Diagnosis not present

## 2016-05-08 DIAGNOSIS — F0150 Vascular dementia without behavioral disturbance: Secondary | ICD-10-CM | POA: Diagnosis not present

## 2016-05-08 DIAGNOSIS — C3492 Malignant neoplasm of unspecified part of left bronchus or lung: Secondary | ICD-10-CM | POA: Diagnosis not present

## 2016-05-08 DIAGNOSIS — I10 Essential (primary) hypertension: Secondary | ICD-10-CM | POA: Diagnosis not present

## 2016-05-08 DIAGNOSIS — E039 Hypothyroidism, unspecified: Secondary | ICD-10-CM | POA: Diagnosis not present

## 2016-05-08 DIAGNOSIS — I519 Heart disease, unspecified: Secondary | ICD-10-CM | POA: Diagnosis not present

## 2016-05-09 NOTE — Telephone Encounter (Signed)
Ok six ref 

## 2016-05-22 DIAGNOSIS — I519 Heart disease, unspecified: Secondary | ICD-10-CM | POA: Diagnosis not present

## 2016-05-22 DIAGNOSIS — C3492 Malignant neoplasm of unspecified part of left bronchus or lung: Secondary | ICD-10-CM | POA: Diagnosis not present

## 2016-05-22 DIAGNOSIS — F0150 Vascular dementia without behavioral disturbance: Secondary | ICD-10-CM | POA: Diagnosis not present

## 2016-05-22 DIAGNOSIS — I10 Essential (primary) hypertension: Secondary | ICD-10-CM | POA: Diagnosis not present

## 2016-05-22 DIAGNOSIS — E039 Hypothyroidism, unspecified: Secondary | ICD-10-CM | POA: Diagnosis not present

## 2016-05-22 DIAGNOSIS — K219 Gastro-esophageal reflux disease without esophagitis: Secondary | ICD-10-CM | POA: Diagnosis not present

## 2016-05-26 DIAGNOSIS — R52 Pain, unspecified: Secondary | ICD-10-CM | POA: Diagnosis not present

## 2016-05-26 DIAGNOSIS — K219 Gastro-esophageal reflux disease without esophagitis: Secondary | ICD-10-CM | POA: Diagnosis not present

## 2016-05-26 DIAGNOSIS — I519 Heart disease, unspecified: Secondary | ICD-10-CM | POA: Diagnosis not present

## 2016-05-26 DIAGNOSIS — I10 Essential (primary) hypertension: Secondary | ICD-10-CM | POA: Diagnosis not present

## 2016-05-26 DIAGNOSIS — F0150 Vascular dementia without behavioral disturbance: Secondary | ICD-10-CM | POA: Diagnosis not present

## 2016-05-26 DIAGNOSIS — R531 Weakness: Secondary | ICD-10-CM | POA: Diagnosis not present

## 2016-05-26 DIAGNOSIS — R630 Anorexia: Secondary | ICD-10-CM | POA: Diagnosis not present

## 2016-05-26 DIAGNOSIS — C3492 Malignant neoplasm of unspecified part of left bronchus or lung: Secondary | ICD-10-CM | POA: Diagnosis not present

## 2016-05-26 DIAGNOSIS — R5383 Other fatigue: Secondary | ICD-10-CM | POA: Diagnosis not present

## 2016-05-26 DIAGNOSIS — R110 Nausea: Secondary | ICD-10-CM | POA: Diagnosis not present

## 2016-05-26 DIAGNOSIS — E039 Hypothyroidism, unspecified: Secondary | ICD-10-CM | POA: Diagnosis not present

## 2016-05-29 DIAGNOSIS — K219 Gastro-esophageal reflux disease without esophagitis: Secondary | ICD-10-CM | POA: Diagnosis not present

## 2016-05-29 DIAGNOSIS — I519 Heart disease, unspecified: Secondary | ICD-10-CM | POA: Diagnosis not present

## 2016-05-29 DIAGNOSIS — I10 Essential (primary) hypertension: Secondary | ICD-10-CM | POA: Diagnosis not present

## 2016-05-29 DIAGNOSIS — F0150 Vascular dementia without behavioral disturbance: Secondary | ICD-10-CM | POA: Diagnosis not present

## 2016-05-29 DIAGNOSIS — E039 Hypothyroidism, unspecified: Secondary | ICD-10-CM | POA: Diagnosis not present

## 2016-05-29 DIAGNOSIS — C3492 Malignant neoplasm of unspecified part of left bronchus or lung: Secondary | ICD-10-CM | POA: Diagnosis not present

## 2016-05-30 ENCOUNTER — Other Ambulatory Visit: Payer: Self-pay | Admitting: Family Medicine

## 2016-05-30 NOTE — Telephone Encounter (Signed)
May have this +2 refills 

## 2016-06-01 DIAGNOSIS — I10 Essential (primary) hypertension: Secondary | ICD-10-CM | POA: Diagnosis not present

## 2016-06-01 DIAGNOSIS — C3492 Malignant neoplasm of unspecified part of left bronchus or lung: Secondary | ICD-10-CM | POA: Diagnosis not present

## 2016-06-01 DIAGNOSIS — F0150 Vascular dementia without behavioral disturbance: Secondary | ICD-10-CM | POA: Diagnosis not present

## 2016-06-01 DIAGNOSIS — E039 Hypothyroidism, unspecified: Secondary | ICD-10-CM | POA: Diagnosis not present

## 2016-06-01 DIAGNOSIS — I519 Heart disease, unspecified: Secondary | ICD-10-CM | POA: Diagnosis not present

## 2016-06-01 DIAGNOSIS — K219 Gastro-esophageal reflux disease without esophagitis: Secondary | ICD-10-CM | POA: Diagnosis not present

## 2016-06-02 DIAGNOSIS — E039 Hypothyroidism, unspecified: Secondary | ICD-10-CM | POA: Diagnosis not present

## 2016-06-02 DIAGNOSIS — K219 Gastro-esophageal reflux disease without esophagitis: Secondary | ICD-10-CM | POA: Diagnosis not present

## 2016-06-02 DIAGNOSIS — C3492 Malignant neoplasm of unspecified part of left bronchus or lung: Secondary | ICD-10-CM | POA: Diagnosis not present

## 2016-06-02 DIAGNOSIS — F0150 Vascular dementia without behavioral disturbance: Secondary | ICD-10-CM | POA: Diagnosis not present

## 2016-06-02 DIAGNOSIS — I10 Essential (primary) hypertension: Secondary | ICD-10-CM | POA: Diagnosis not present

## 2016-06-02 DIAGNOSIS — I519 Heart disease, unspecified: Secondary | ICD-10-CM | POA: Diagnosis not present

## 2016-06-05 DIAGNOSIS — C3492 Malignant neoplasm of unspecified part of left bronchus or lung: Secondary | ICD-10-CM | POA: Diagnosis not present

## 2016-06-05 DIAGNOSIS — I519 Heart disease, unspecified: Secondary | ICD-10-CM | POA: Diagnosis not present

## 2016-06-05 DIAGNOSIS — F0150 Vascular dementia without behavioral disturbance: Secondary | ICD-10-CM | POA: Diagnosis not present

## 2016-06-05 DIAGNOSIS — K219 Gastro-esophageal reflux disease without esophagitis: Secondary | ICD-10-CM | POA: Diagnosis not present

## 2016-06-05 DIAGNOSIS — I10 Essential (primary) hypertension: Secondary | ICD-10-CM | POA: Diagnosis not present

## 2016-06-05 DIAGNOSIS — E039 Hypothyroidism, unspecified: Secondary | ICD-10-CM | POA: Diagnosis not present

## 2016-06-12 DIAGNOSIS — I10 Essential (primary) hypertension: Secondary | ICD-10-CM | POA: Diagnosis not present

## 2016-06-12 DIAGNOSIS — K219 Gastro-esophageal reflux disease without esophagitis: Secondary | ICD-10-CM | POA: Diagnosis not present

## 2016-06-12 DIAGNOSIS — C3492 Malignant neoplasm of unspecified part of left bronchus or lung: Secondary | ICD-10-CM | POA: Diagnosis not present

## 2016-06-12 DIAGNOSIS — E039 Hypothyroidism, unspecified: Secondary | ICD-10-CM | POA: Diagnosis not present

## 2016-06-12 DIAGNOSIS — F0150 Vascular dementia without behavioral disturbance: Secondary | ICD-10-CM | POA: Diagnosis not present

## 2016-06-12 DIAGNOSIS — I519 Heart disease, unspecified: Secondary | ICD-10-CM | POA: Diagnosis not present

## 2016-06-15 ENCOUNTER — Emergency Department (HOSPITAL_COMMUNITY): Payer: Medicare Other

## 2016-06-15 ENCOUNTER — Encounter (HOSPITAL_COMMUNITY): Payer: Self-pay | Admitting: Emergency Medicine

## 2016-06-15 ENCOUNTER — Emergency Department (HOSPITAL_COMMUNITY)
Admission: EM | Admit: 2016-06-15 | Discharge: 2016-06-15 | Disposition: A | Discharge status: Home / Self Care | Payer: Medicare Other | Attending: Emergency Medicine | Admitting: Emergency Medicine

## 2016-06-15 DIAGNOSIS — Z85118 Personal history of other malignant neoplasm of bronchus and lung: Secondary | ICD-10-CM | POA: Diagnosis not present

## 2016-06-15 DIAGNOSIS — R197 Diarrhea, unspecified: Secondary | ICD-10-CM | POA: Diagnosis not present

## 2016-06-15 DIAGNOSIS — R531 Weakness: Secondary | ICD-10-CM | POA: Diagnosis not present

## 2016-06-15 DIAGNOSIS — E039 Hypothyroidism, unspecified: Secondary | ICD-10-CM | POA: Insufficient documentation

## 2016-06-15 DIAGNOSIS — I2510 Atherosclerotic heart disease of native coronary artery without angina pectoris: Secondary | ICD-10-CM | POA: Insufficient documentation

## 2016-06-15 DIAGNOSIS — E860 Dehydration: Secondary | ICD-10-CM | POA: Insufficient documentation

## 2016-06-15 DIAGNOSIS — Z87891 Personal history of nicotine dependence: Secondary | ICD-10-CM | POA: Diagnosis not present

## 2016-06-15 DIAGNOSIS — J449 Chronic obstructive pulmonary disease, unspecified: Secondary | ICD-10-CM | POA: Insufficient documentation

## 2016-06-15 DIAGNOSIS — I10 Essential (primary) hypertension: Secondary | ICD-10-CM | POA: Insufficient documentation

## 2016-06-15 DIAGNOSIS — R404 Transient alteration of awareness: Secondary | ICD-10-CM | POA: Diagnosis not present

## 2016-06-15 DIAGNOSIS — Z79899 Other long term (current) drug therapy: Secondary | ICD-10-CM | POA: Diagnosis not present

## 2016-06-15 LAB — CBC WITH DIFFERENTIAL/PLATELET
BASOS PCT: 1 %
Basophils Absolute: 0 10*3/uL (ref 0.0–0.1)
Eosinophils Absolute: 0.6 10*3/uL (ref 0.0–0.7)
Eosinophils Relative: 15 %
HEMATOCRIT: 30.6 % — AB (ref 36.0–46.0)
Hemoglobin: 9.6 g/dL — ABNORMAL LOW (ref 12.0–15.0)
Lymphocytes Relative: 30 %
Lymphs Abs: 1.2 10*3/uL (ref 0.7–4.0)
MCH: 32.3 pg (ref 26.0–34.0)
MCHC: 31.4 g/dL (ref 30.0–36.0)
MCV: 103 fL — AB (ref 78.0–100.0)
MONO ABS: 0.5 10*3/uL (ref 0.1–1.0)
MONOS PCT: 13 %
NEUTROS ABS: 1.6 10*3/uL — AB (ref 1.7–7.7)
Neutrophils Relative %: 41 %
Platelets: 145 10*3/uL — ABNORMAL LOW (ref 150–400)
RBC: 2.97 MIL/uL — ABNORMAL LOW (ref 3.87–5.11)
RDW: 14.7 % (ref 11.5–15.5)
WBC: 3.9 10*3/uL — ABNORMAL LOW (ref 4.0–10.5)

## 2016-06-15 LAB — COMPREHENSIVE METABOLIC PANEL
ALBUMIN: 3.3 g/dL — AB (ref 3.5–5.0)
ALT: 6 U/L — ABNORMAL LOW (ref 14–54)
ANION GAP: 5 (ref 5–15)
AST: 13 U/L — ABNORMAL LOW (ref 15–41)
Alkaline Phosphatase: 67 U/L (ref 38–126)
BILIRUBIN TOTAL: 0.5 mg/dL (ref 0.3–1.2)
BUN: 12 mg/dL (ref 6–20)
CO2: 27 mmol/L (ref 22–32)
Calcium: 9.1 mg/dL (ref 8.9–10.3)
Chloride: 109 mmol/L (ref 101–111)
Creatinine, Ser: 1.51 mg/dL — ABNORMAL HIGH (ref 0.44–1.00)
GFR, EST AFRICAN AMERICAN: 37 mL/min — AB (ref >60)
GFR, EST NON AFRICAN AMERICAN: 32 mL/min — AB (ref >60)
Glucose, Bld: 85 mg/dL (ref 65–99)
POTASSIUM: 3.8 mmol/L (ref 3.5–5.1)
Sodium: 141 mmol/L (ref 135–145)
TOTAL PROTEIN: 5.9 g/dL — AB (ref 6.5–8.1)

## 2016-06-15 LAB — URINALYSIS, ROUTINE W REFLEX MICROSCOPIC
Bilirubin Urine: NEGATIVE
Glucose, UA: NEGATIVE mg/dL
Hgb urine dipstick: NEGATIVE
Ketones, ur: NEGATIVE mg/dL
LEUKOCYTES UA: NEGATIVE
NITRITE: NEGATIVE
PROTEIN: NEGATIVE mg/dL
Specific Gravity, Urine: 1.01 (ref 1.005–1.030)
pH: 5 (ref 5.0–8.0)

## 2016-06-15 MED ORDER — SODIUM CHLORIDE 0.9 % IV BOLUS (SEPSIS)
1000.0000 mL | Freq: Once | INTRAVENOUS | Status: AC
  Administered 2016-06-15: 1000 mL via INTRAVENOUS

## 2016-06-15 NOTE — ED Notes (Signed)
In and out cath performed on pt with [name], NT.  400ML urine output.

## 2016-06-15 NOTE — ED Notes (Signed)
Pt taken to XR.  

## 2016-06-15 NOTE — ED Notes (Signed)
Pt made aware to return if symptoms worsen or if any life threatening symptoms occur.   

## 2016-06-15 NOTE — ED Provider Notes (Signed)
[facility] DEPT Provider Note   CSN: [id number] Arrival date & time: 06/15/16  1051  By signing my name below, I, Emmanuella Mensah, attest that this documentation has been prepared under the direction and in the presence of Milton Ferguson, MD. Electronically Signed: Judithann Sauger, ED Scribe. 06/15/16. 12:00 PM.   History   Chief Complaint Chief Complaint  Patient presents with  . Weakness   HPI Comments: [name] is a 81 y.o. female with a hx of hypertension, chronic diarrhea, IBS, GERD, and CAD brought in by ambulance, who presents to the Emergency Department complaining of gradual onset, persistent moderate generalized weakness onset this morning. She reports associated hypotension this morning as well when her daughter checked her BP, however pt's BP is normal here in the ED. She adds that she was feeling fine when she went to bed last night although she has been experiencing multiple episodes of non-bloody diarrhea and mild abdominal pain since last night. No alleviating factors noted. Pt has not tried any medications PTA. She has an allergy to Aleve, Dilaudid, Dyazide, and Zithromax. She denies any fever, chills, nausea, vomiting, or any other symptoms.   The history is provided by the patient. No language interpreter was used.  Weakness  Primary symptoms comment: generalized weakness. This is a new problem. The current episode started 3 to 5 hours ago. The problem has not changed since onset.There was no focality noted. There has been no fever. Pertinent negatives include no chest pain, no vomiting and no headaches.    Past Medical History:  Diagnosis Date  . Acute respiratory failure ([place])    10/2014  . Adrenal adenoma   . Arthritis    knee  . Cancer ([place])    Lung  . Chronic diarrhea   . COPD (chronic obstructive pulmonary disease) ([place])    moderat  . Coronary artery disease   . Family history of heart disease   . Fatty liver   . GERD (gastroesophageal reflux  disease)    chronic gastritis  . HCAP (healthcare-associated pneumonia)    10/2014  . Hyperlipidemia   . Hypertension   . Hypothyroidism   . IBS (irritable bowel syndrome)   . Impaired fasting glucose   . Insomnia   . Peripheral arterial disease ([place])    sstatus post  infrarenal abdominal aortic tube graft placed by Dr. Victorino Dike February 2008  . Pleural effusion   . QT prolongation    syncope  . Renal insufficiency    low protien diet  . Squamous cell carcinoma of left lung ([place]) 07/29/2013  . Tobacco use    1/2 ppd, approx 25-50 pack years (as of 08/2012)    Patient Active Problem List   Diagnosis Date Noted  . Malnutrition of moderate degree 10/08/2015  . Acute cholecystitis 10/06/2015  . Moderate dementia with behavioral disturbance 08/05/2015  . Moderate dementia without behavioral disturbance 01/26/2015  . Depression 01/26/2015  . Acute respiratory failure with hypoxia ([place]) 11/16/2014  . Hypoxia 11/15/2014  . SOB (shortness of breath)   . Orthostatic hypotension 07/20/2014  . Loose stools 07/20/2014  . Failure to thrive in adult 07/20/2014  . Syncope 07/17/2014  . Near syncope 07/16/2014  . Cholelithiasis 11/07/2013  . AKI (acute kidney injury) ([place]) 09/22/2013  . COPD, moderate ([place]) 09/22/2013  . Squamous cell carcinoma of left lung ([place]) 07/29/2013  . Anemia, iron deficiency 03/03/2013  . Coronary artery disease 02/28/2013  . Essential hypertension 02/28/2013  . Hyperlipidemia 02/28/2013  . Peripheral  arterial disease ([place]) 02/28/2013  . Generalized anxiety disorder 12/10/2012  . Insomnia 12/10/2012  . Esophageal reflux 12/10/2012  . Chronic renal insufficiency 11/07/2012  . Unspecified hypothyroidism 09/10/2012    Past Surgical History:  Procedure Laterality Date  . ABDOMINAL AORTIC ANEURYSM REPAIR  07/2006   D. J.D. Kellie Simmering  . ABDOMINAL HYSTERECTOMY    . CARDIAC CATHETERIZATION  02/2010   mod CAD in L-dominant system with normal LV function  . CATARACT  EXTRACTION Bilateral 2010  . COLON RESECTION  2005   1/2 colon removed  . COLONOSCOPY  5/04   normal  . COLONOSCOPY WITH ESOPHAGOGASTRODUODENOSCOPY (EGD) N/A 03/19/2013   Procedure: COLONOSCOPY WITH ESOPHAGOGASTRODUODENOSCOPY (EGD);  Surgeon: Rogene Houston, MD;  Location: [facility] ENDO SUITE;  Service: Endoscopy;  Laterality: N/A;  145  . DG BIOPSY LUNG Left Jan 2015  . IR GENERIC HISTORICAL  04/06/2016   IR EXCHANGE BILIARY DRAIN 04/06/2016 [facility]-INTERV RAD  . THYROIDECTOMY, PARTIAL    . TRANSTHORACIC ECHOCARDIOGRAM  02/2010   EF 55-60%; mild conc LVH, normal systolic function; mildly calcified AV annulus    OB History    Gravida Para Term Preterm AB Living   '2 2 2         '$ SAB TAB Ectopic Multiple Live Births                   Home Medications    Prior to Admission medications   Medication Sig Start Date End Date Taking? Authorizing Provider  acetaminophen (TYLENOL) 500 MG tablet Take 500 mg by mouth every 6 (six) hours as needed for mild pain or moderate pain.    Historical Provider, MD  allopurinol (ZYLOPRIM) 300 MG tablet take 1 tablet by mouth once daily 04/19/16   Mikey Kirschner, MD  ALPRAZolam Duanne Moron) 1 MG tablet TAKE  ONE TABLET BY MOUTH THREE TIMES DAILY AS NEEDED FOR ANXIETY 03/30/16   Mikey Kirschner, MD  amLODipine (NORVASC) 5 MG tablet take 1 tablet by mouth once daily 03/06/16   Mikey Kirschner, MD  aspirin EC 81 MG tablet Take 81 mg by mouth daily.    Historical Provider, MD  diphenoxylate-atropine (LOMOTIL) 2.5-0.025 MG tablet TAKE (1) TABLET BY MOUTH (4) TIMES DAILY. 05/09/16   Mikey Kirschner, MD  donepezil (ARICEPT) 10 MG tablet Take 1 tablet daily 08/02/15   Cameron Sprang, MD  donepezil (ARICEPT) 10 MG tablet take 1 tablet by mouth once daily 01/19/16   Cameron Sprang, MD  DULoxetine (CYMBALTA) 60 MG capsule Take 1 capsule (60 mg total) by mouth daily. 01/17/16   Mikey Kirschner, MD  enalapril (VASOTEC) 5 MG tablet Take 5 mg by mouth daily. 08/19/15   Historical  Provider, MD  ferrous sulfate (FERROUSUL) 325 (65 FE) MG tablet Take 1 tablet (325 mg total) by mouth 2 (two) times daily after a meal. 03/19/13   Rogene Houston, MD  folic acid (FOLVITE) 1 MG tablet Take 1 tablet (1 mg total) by mouth daily. 08/03/15   Mikey Kirschner, MD  folic acid (FOLVITE) 1 MG tablet take 1 tablet by mouth once daily 01/19/16   Mikey Kirschner, MD  hydrALAZINE (APRESOLINE) 25 MG tablet take 1 tablet by mouth three times a day 04/19/16   Mikey Kirschner, MD  HYDROcodone-acetaminophen (NORCO/VICODIN) 5-325 MG tablet Take 1 tablet twice a day as needed for pain 01/17/16   Mikey Kirschner, MD  levothyroxine (SYNTHROID, LEVOTHROID) 137 MCG tablet Take 1 tablet (137  mcg total) by mouth daily. 01/17/16   Mikey Kirschner, MD  lidocaine-prilocaine (EMLA) cream Apply 1 application topically as needed (port). 12/06/15   Baird Cancer, PA-C  loperamide (IMODIUM) 2 MG capsule Take 2 mg by mouth 3 (three) times daily as needed for diarrhea or loose stools.     Historical Provider, MD  meclizine (ANTIVERT) 25 MG tablet Take 1 tablet (25 mg total) by mouth 3 (three) times daily as needed for dizziness. 08/19/15   Mikey Kirschner, MD  Menthol-Methyl Salicylate (MUSCLE RUB) 10-15 % CREA Apply 1 application topically as needed for muscle pain.    Historical Provider, MD  ondansetron (ZOFRAN-ODT) 4 MG disintegrating tablet dissolve 1 tablet ON TONGUE every 8 hours if needed for nausea and vomiting 02/10/16   Mikey Kirschner, MD  pantoprazole (PROTONIX) 40 MG tablet Take 1 tablet (40 mg total) by mouth daily. 07/22/15   Mikey Kirschner, MD  potassium chloride (K-DUR) 10 MEQ tablet take 2 tablet by mouth once daily 03/20/16   Mikey Kirschner, MD  QUEtiapine (SEROQUEL) 50 MG tablet Take 1 tablet (50 mg total) by mouth at bedtime. PLEASE DELIVER 03/30/16   Mikey Kirschner, MD  SPIRIVA HANDIHALER 18 MCG inhalation capsule Place 18 mcg into inhaler and inhale as needed (for shortness of breath).   11/14/14   Historical Provider, MD  traMADol (ULTRAM) 50 MG tablet TAKE 1 TABLET BY MOUTH EVERY SIX HOURS AS NEEDED FOR PAIN. 05/30/16   Kathyrn Drown, MD  Vitamin D, Ergocalciferol, (DRISDOL) 50000 UNITS CAPS capsule Take 1 capsule (50,000 Units total) by mouth once a week. 06/14/15   Mikey Kirschner, MD    Family History Family History  Problem Relation Age of Onset  . Hypertension Mother   . Diabetes Mother   . Heart attack Mother   . Hyperlipidemia Sister     x3 sister  . Kidney disease Brother     Social History Social History  Substance Use Topics  . Smoking status: Former Smoker    Packs/day: 0.50    Years: 59.00    Types: Cigarettes  . Smokeless tobacco: Never Used     Comment: smoking since age [age]  . Alcohol use No     Allergies   Aleve [naproxen sodium]; Dilaudid [hydromorphone hcl]; Dyazide [hydrochlorothiazide w-triamterene]; and Zithromax [azithromycin]   Review of Systems Review of Systems  Constitutional: Negative for appetite change and fatigue.  HENT: Negative for congestion, ear discharge and sinus pressure.   Eyes: Negative for discharge.  Respiratory: Negative for cough.   Cardiovascular: Negative for chest pain.  Gastrointestinal: Positive for diarrhea. Negative for abdominal pain, nausea and vomiting.  Genitourinary: Negative for frequency and hematuria.  Musculoskeletal: Negative for back pain.  Skin: Negative for rash.  Neurological: Positive for weakness. Negative for seizures and headaches.  Psychiatric/Behavioral: Negative for hallucinations.     Physical Exam Updated Vital Signs BP 129/81 (BP Location: Left Arm)   Pulse 70   Temp 97.9 F (36.6 C) (Oral)   Ht '5\' 7"'$  (1.702 m)   Wt 136 lb (61.7 kg)   SpO2 100%   BMI 21.30 kg/m   Physical Exam  Constitutional: She is oriented to person, place, and time. She appears well-developed.  HENT:  Head: Normocephalic.  Eyes: Conjunctivae and EOM are normal. No scleral icterus.  Neck:  Neck supple. No thyromegaly present.  Cardiovascular: Normal rate and regular rhythm.  Exam reveals no gallop and no friction rub.  No murmur heard. Pulmonary/Chest: No stridor. She has no wheezes. She has no rales. She exhibits no tenderness.  Abdominal: She exhibits no distension. There is tenderness. There is no rebound.  Mild TTP abdomen  Musculoskeletal: Normal range of motion. She exhibits no edema.  Lymphadenopathy:    She has no cervical adenopathy.  Neurological: She is oriented to person, place, and time. She exhibits normal muscle tone. Coordination normal.  Skin: No rash noted. No erythema.  Psychiatric: She has a normal mood and affect. Her behavior is normal.     ED Treatments / Results  DIAGNOSTIC STUDIES: Oxygen Saturation is 100% on RA, normal by my interpretation.    COORDINATION OF CARE: 11:36 AM- Pt advised of plan for treatment and pt agrees. Pt will receive UA, CMP, CBC, abdomen x-ray, and EKG for further evaluation. She will also receive IV fluids.    Labs (all labs ordered are listed, but only abnormal results are displayed) Labs Reviewed - No data to display  EKG  EKG Interpretation None       Radiology No results found.  Procedures Procedures (including critical care time)  Medications Ordered in ED Medications - No data to display   Initial Impression / Assessment and Plan / ED Course  Milton Ferguson, MD has reviewed the triage vital signs and the nursing notes.  Pertinent labs & imaging results that were available during my care of the patient were reviewed by me and considered in my medical decision making (see chart for details).  Clinical Course     Patient with dehydration from diarrhea. Patient was hydrated and has improved. Diarrhea possibly related to irritable bowel or infectious agent. She will drink a lot of fluids and follow-up with her doctor as needed  Final Clinical Impressions(s) / ED Diagnoses   Final diagnoses:  None     New Prescriptions New Prescriptions   No medications on file   The chart was scribed for me under my direct supervision.  I personally performed the history, physical, and medical decision making and all procedures in the evaluation of this patient.Milton Ferguson, MD 06/15/16 [phone]

## 2016-06-15 NOTE — Discharge Instructions (Signed)
Drink plenty of fluids. Rest at home. Take 2 Imodium as needed for diarrhea. Follow-up with her family doctor if not improving

## 2016-06-15 NOTE — ED Triage Notes (Addendum)
Per EMS: Pt c/o hypotension and increased weakness since today.  Pt was orthostatic with EMS.  Pt had diarrhea starting last night. Denies nausea and denies abd pain at this time.  Pt did have lower abd pain this morning. Pt woke up with weakness, went to bed at 11pm last night.  Pt alert and oriented at this time.   Pt on 4L 02 [place].

## 2016-06-16 DIAGNOSIS — K219 Gastro-esophageal reflux disease without esophagitis: Secondary | ICD-10-CM | POA: Diagnosis not present

## 2016-06-16 DIAGNOSIS — C3492 Malignant neoplasm of unspecified part of left bronchus or lung: Secondary | ICD-10-CM | POA: Diagnosis not present

## 2016-06-16 DIAGNOSIS — I519 Heart disease, unspecified: Secondary | ICD-10-CM | POA: Diagnosis not present

## 2016-06-16 DIAGNOSIS — I10 Essential (primary) hypertension: Secondary | ICD-10-CM | POA: Diagnosis not present

## 2016-06-16 DIAGNOSIS — F0150 Vascular dementia without behavioral disturbance: Secondary | ICD-10-CM | POA: Diagnosis not present

## 2016-06-16 DIAGNOSIS — E039 Hypothyroidism, unspecified: Secondary | ICD-10-CM | POA: Diagnosis not present

## 2016-06-20 DIAGNOSIS — C3492 Malignant neoplasm of unspecified part of left bronchus or lung: Secondary | ICD-10-CM | POA: Diagnosis not present

## 2016-06-20 DIAGNOSIS — I519 Heart disease, unspecified: Secondary | ICD-10-CM | POA: Diagnosis not present

## 2016-06-20 DIAGNOSIS — K219 Gastro-esophageal reflux disease without esophagitis: Secondary | ICD-10-CM | POA: Diagnosis not present

## 2016-06-20 DIAGNOSIS — E039 Hypothyroidism, unspecified: Secondary | ICD-10-CM | POA: Diagnosis not present

## 2016-06-20 DIAGNOSIS — I10 Essential (primary) hypertension: Secondary | ICD-10-CM | POA: Diagnosis not present

## 2016-06-20 DIAGNOSIS — F0150 Vascular dementia without behavioral disturbance: Secondary | ICD-10-CM | POA: Diagnosis not present

## 2016-06-21 ENCOUNTER — Other Ambulatory Visit: Payer: Self-pay | Admitting: Family Medicine

## 2016-06-22 ENCOUNTER — Telehealth: Payer: Self-pay | Admitting: Family Medicine

## 2016-06-22 DIAGNOSIS — E039 Hypothyroidism, unspecified: Secondary | ICD-10-CM | POA: Diagnosis not present

## 2016-06-22 DIAGNOSIS — F0150 Vascular dementia without behavioral disturbance: Secondary | ICD-10-CM | POA: Diagnosis not present

## 2016-06-22 DIAGNOSIS — C3492 Malignant neoplasm of unspecified part of left bronchus or lung: Secondary | ICD-10-CM | POA: Diagnosis not present

## 2016-06-22 DIAGNOSIS — I519 Heart disease, unspecified: Secondary | ICD-10-CM | POA: Diagnosis not present

## 2016-06-22 DIAGNOSIS — K219 Gastro-esophageal reflux disease without esophagitis: Secondary | ICD-10-CM | POA: Diagnosis not present

## 2016-06-22 DIAGNOSIS — I10 Essential (primary) hypertension: Secondary | ICD-10-CM | POA: Diagnosis not present

## 2016-06-22 NOTE — Telephone Encounter (Signed)
[facility] called to notify us that the patient had a fall 06/21/16 in the evening with no injuries  ?'s call Cassandra [phone], ext [phone]

## 2016-06-22 NOTE — Telephone Encounter (Signed)
Noted  

## 2016-06-26 DIAGNOSIS — I10 Essential (primary) hypertension: Secondary | ICD-10-CM | POA: Diagnosis not present

## 2016-06-26 DIAGNOSIS — K219 Gastro-esophageal reflux disease without esophagitis: Secondary | ICD-10-CM | POA: Diagnosis not present

## 2016-06-26 DIAGNOSIS — I519 Heart disease, unspecified: Secondary | ICD-10-CM | POA: Diagnosis not present

## 2016-06-26 DIAGNOSIS — R630 Anorexia: Secondary | ICD-10-CM | POA: Diagnosis not present

## 2016-06-26 DIAGNOSIS — E039 Hypothyroidism, unspecified: Secondary | ICD-10-CM | POA: Diagnosis not present

## 2016-06-26 DIAGNOSIS — R531 Weakness: Secondary | ICD-10-CM | POA: Diagnosis not present

## 2016-06-26 DIAGNOSIS — R5383 Other fatigue: Secondary | ICD-10-CM | POA: Diagnosis not present

## 2016-06-26 DIAGNOSIS — C3492 Malignant neoplasm of unspecified part of left bronchus or lung: Secondary | ICD-10-CM | POA: Diagnosis not present

## 2016-06-26 DIAGNOSIS — F0150 Vascular dementia without behavioral disturbance: Secondary | ICD-10-CM | POA: Diagnosis not present

## 2016-06-26 DIAGNOSIS — R110 Nausea: Secondary | ICD-10-CM | POA: Diagnosis not present

## 2016-06-26 DIAGNOSIS — R52 Pain, unspecified: Secondary | ICD-10-CM | POA: Diagnosis not present

## 2016-06-27 ENCOUNTER — Ambulatory Visit (HOSPITAL_COMMUNITY)
Admission: RE | Admit: 2016-06-27 | Discharge: 2016-06-27 | Disposition: A | Discharge status: Home / Self Care | Payer: Medicare Other | Source: Ambulatory Visit | Attending: Radiology | Admitting: Radiology

## 2016-06-27 ENCOUNTER — Other Ambulatory Visit: Payer: Self-pay | Admitting: Radiology

## 2016-06-27 ENCOUNTER — Encounter (HOSPITAL_COMMUNITY): Payer: Self-pay | Admitting: Interventional Radiology

## 2016-06-27 DIAGNOSIS — K810 Acute cholecystitis: Secondary | ICD-10-CM

## 2016-06-27 DIAGNOSIS — Z4803 Encounter for change or removal of drains: Secondary | ICD-10-CM | POA: Insufficient documentation

## 2016-06-27 DIAGNOSIS — T83090A Other mechanical complication of cystostomy catheter, initial encounter: Secondary | ICD-10-CM | POA: Diagnosis not present

## 2016-06-27 HISTORY — PX: IR GENERIC HISTORICAL: IMG1180011

## 2016-06-27 MED ORDER — LIDOCAINE HCL 1 % IJ SOLN
INTRAMUSCULAR | Status: AC
  Filled 2016-06-27: qty 20

## 2016-06-27 MED ORDER — IOPAMIDOL (ISOVUE-300) INJECTION 61%
50.0000 mL | Freq: Once | INTRAVENOUS | Status: AC | PRN
  Administered 2016-06-27: 10 mL via INTRAVENOUS

## 2016-06-27 MED ORDER — LIDOCAINE HCL 1 % IJ SOLN
INTRAMUSCULAR | Status: AC | PRN
  Administered 2016-06-27: 5 mL

## 2016-06-27 MED ORDER — IOPAMIDOL (ISOVUE-300) INJECTION 61%
INTRAVENOUS | Status: AC
  Filled 2016-06-27: qty 50

## 2016-06-28 DIAGNOSIS — C3492 Malignant neoplasm of unspecified part of left bronchus or lung: Secondary | ICD-10-CM | POA: Diagnosis not present

## 2016-06-28 DIAGNOSIS — F0150 Vascular dementia without behavioral disturbance: Secondary | ICD-10-CM | POA: Diagnosis not present

## 2016-06-28 DIAGNOSIS — I10 Essential (primary) hypertension: Secondary | ICD-10-CM | POA: Diagnosis not present

## 2016-06-28 DIAGNOSIS — K219 Gastro-esophageal reflux disease without esophagitis: Secondary | ICD-10-CM | POA: Diagnosis not present

## 2016-06-28 DIAGNOSIS — I519 Heart disease, unspecified: Secondary | ICD-10-CM | POA: Diagnosis not present

## 2016-06-28 DIAGNOSIS — E039 Hypothyroidism, unspecified: Secondary | ICD-10-CM | POA: Diagnosis not present

## 2016-06-30 DIAGNOSIS — C3492 Malignant neoplasm of unspecified part of left bronchus or lung: Secondary | ICD-10-CM | POA: Diagnosis not present

## 2016-06-30 DIAGNOSIS — I10 Essential (primary) hypertension: Secondary | ICD-10-CM | POA: Diagnosis not present

## 2016-06-30 DIAGNOSIS — K219 Gastro-esophageal reflux disease without esophagitis: Secondary | ICD-10-CM | POA: Diagnosis not present

## 2016-06-30 DIAGNOSIS — E039 Hypothyroidism, unspecified: Secondary | ICD-10-CM | POA: Diagnosis not present

## 2016-06-30 DIAGNOSIS — I519 Heart disease, unspecified: Secondary | ICD-10-CM | POA: Diagnosis not present

## 2016-06-30 DIAGNOSIS — F0150 Vascular dementia without behavioral disturbance: Secondary | ICD-10-CM | POA: Diagnosis not present

## 2016-07-04 DIAGNOSIS — F0150 Vascular dementia without behavioral disturbance: Secondary | ICD-10-CM | POA: Diagnosis not present

## 2016-07-04 DIAGNOSIS — K219 Gastro-esophageal reflux disease without esophagitis: Secondary | ICD-10-CM | POA: Diagnosis not present

## 2016-07-04 DIAGNOSIS — I519 Heart disease, unspecified: Secondary | ICD-10-CM | POA: Diagnosis not present

## 2016-07-04 DIAGNOSIS — C3492 Malignant neoplasm of unspecified part of left bronchus or lung: Secondary | ICD-10-CM | POA: Diagnosis not present

## 2016-07-04 DIAGNOSIS — E039 Hypothyroidism, unspecified: Secondary | ICD-10-CM | POA: Diagnosis not present

## 2016-07-04 DIAGNOSIS — I10 Essential (primary) hypertension: Secondary | ICD-10-CM | POA: Diagnosis not present

## 2016-07-05 ENCOUNTER — Other Ambulatory Visit: Payer: Self-pay | Admitting: Family Medicine

## 2016-07-05 DIAGNOSIS — I519 Heart disease, unspecified: Secondary | ICD-10-CM | POA: Diagnosis not present

## 2016-07-05 DIAGNOSIS — E039 Hypothyroidism, unspecified: Secondary | ICD-10-CM | POA: Diagnosis not present

## 2016-07-05 DIAGNOSIS — F0150 Vascular dementia without behavioral disturbance: Secondary | ICD-10-CM | POA: Diagnosis not present

## 2016-07-05 DIAGNOSIS — K219 Gastro-esophageal reflux disease without esophagitis: Secondary | ICD-10-CM | POA: Diagnosis not present

## 2016-07-05 DIAGNOSIS — C3492 Malignant neoplasm of unspecified part of left bronchus or lung: Secondary | ICD-10-CM | POA: Diagnosis not present

## 2016-07-05 DIAGNOSIS — I10 Essential (primary) hypertension: Secondary | ICD-10-CM | POA: Diagnosis not present

## 2016-07-06 NOTE — Telephone Encounter (Signed)
Last filled 03/08/16. Pt in hospice care. This is Dr. Jeannine Kitten patient.

## 2016-07-07 ENCOUNTER — Telehealth: Payer: Self-pay | Admitting: Family Medicine

## 2016-07-07 DIAGNOSIS — F0150 Vascular dementia without behavioral disturbance: Secondary | ICD-10-CM | POA: Diagnosis not present

## 2016-07-07 DIAGNOSIS — I519 Heart disease, unspecified: Secondary | ICD-10-CM | POA: Diagnosis not present

## 2016-07-07 DIAGNOSIS — I10 Essential (primary) hypertension: Secondary | ICD-10-CM | POA: Diagnosis not present

## 2016-07-07 DIAGNOSIS — C3492 Malignant neoplasm of unspecified part of left bronchus or lung: Secondary | ICD-10-CM | POA: Diagnosis not present

## 2016-07-07 DIAGNOSIS — K219 Gastro-esophageal reflux disease without esophagitis: Secondary | ICD-10-CM | POA: Diagnosis not present

## 2016-07-07 DIAGNOSIS — E039 Hypothyroidism, unspecified: Secondary | ICD-10-CM | POA: Diagnosis not present

## 2016-07-07 MED ORDER — CEFPROZIL 500 MG PO TABS: 500.0000 mg | ORAL_TABLET | Freq: Two times a day (BID) | ORAL | 0 refills | Status: AC

## 2016-07-07 NOTE — Telephone Encounter (Signed)
Please advise 

## 2016-07-07 NOTE — Telephone Encounter (Signed)
Cefzil 500 mg 1 twice a day for 5 days if unable to swallow of tablet use liquid

## 2016-07-07 NOTE — Telephone Encounter (Signed)
I spoke with patient's sister. I advised her of ATB. She states pt prefers tablets. rx sent.

## 2016-07-07 NOTE — Telephone Encounter (Signed)
Cassandra, nurse at [facility], called to report that patient is having frequent urination and a low grade temp of 99.  She said she told them to increase fluids if they think she has a UTI, but the family requested for Cassandra to call Dr. Wolfgang Phoenix to see if she can have antibiotic.  Please advise.  They are needing to know answer soon.  [employer]

## 2016-07-10 DIAGNOSIS — F0150 Vascular dementia without behavioral disturbance: Secondary | ICD-10-CM | POA: Diagnosis not present

## 2016-07-10 DIAGNOSIS — C3492 Malignant neoplasm of unspecified part of left bronchus or lung: Secondary | ICD-10-CM | POA: Diagnosis not present

## 2016-07-10 DIAGNOSIS — E039 Hypothyroidism, unspecified: Secondary | ICD-10-CM | POA: Diagnosis not present

## 2016-07-10 DIAGNOSIS — I519 Heart disease, unspecified: Secondary | ICD-10-CM | POA: Diagnosis not present

## 2016-07-10 DIAGNOSIS — I10 Essential (primary) hypertension: Secondary | ICD-10-CM | POA: Diagnosis not present

## 2016-07-10 DIAGNOSIS — K219 Gastro-esophageal reflux disease without esophagitis: Secondary | ICD-10-CM | POA: Diagnosis not present

## 2016-07-11 ENCOUNTER — Other Ambulatory Visit: Payer: Self-pay | Admitting: Family Medicine

## 2016-07-14 DIAGNOSIS — C3492 Malignant neoplasm of unspecified part of left bronchus or lung: Secondary | ICD-10-CM | POA: Diagnosis not present

## 2016-07-14 DIAGNOSIS — K219 Gastro-esophageal reflux disease without esophagitis: Secondary | ICD-10-CM | POA: Diagnosis not present

## 2016-07-14 DIAGNOSIS — I519 Heart disease, unspecified: Secondary | ICD-10-CM | POA: Diagnosis not present

## 2016-07-14 DIAGNOSIS — F0150 Vascular dementia without behavioral disturbance: Secondary | ICD-10-CM | POA: Diagnosis not present

## 2016-07-14 DIAGNOSIS — E039 Hypothyroidism, unspecified: Secondary | ICD-10-CM | POA: Diagnosis not present

## 2016-07-14 DIAGNOSIS — I10 Essential (primary) hypertension: Secondary | ICD-10-CM | POA: Diagnosis not present

## 2016-07-17 ENCOUNTER — Other Ambulatory Visit: Payer: Self-pay | Admitting: Family Medicine

## 2016-07-17 DIAGNOSIS — K219 Gastro-esophageal reflux disease without esophagitis: Secondary | ICD-10-CM | POA: Diagnosis not present

## 2016-07-17 DIAGNOSIS — E039 Hypothyroidism, unspecified: Secondary | ICD-10-CM | POA: Diagnosis not present

## 2016-07-17 DIAGNOSIS — C3492 Malignant neoplasm of unspecified part of left bronchus or lung: Secondary | ICD-10-CM | POA: Diagnosis not present

## 2016-07-17 DIAGNOSIS — F0150 Vascular dementia without behavioral disturbance: Secondary | ICD-10-CM | POA: Diagnosis not present

## 2016-07-17 DIAGNOSIS — I10 Essential (primary) hypertension: Secondary | ICD-10-CM | POA: Diagnosis not present

## 2016-07-17 DIAGNOSIS — I519 Heart disease, unspecified: Secondary | ICD-10-CM | POA: Diagnosis not present

## 2016-07-18 DIAGNOSIS — F0150 Vascular dementia without behavioral disturbance: Secondary | ICD-10-CM | POA: Diagnosis not present

## 2016-07-18 DIAGNOSIS — C3492 Malignant neoplasm of unspecified part of left bronchus or lung: Secondary | ICD-10-CM | POA: Diagnosis not present

## 2016-07-18 DIAGNOSIS — K219 Gastro-esophageal reflux disease without esophagitis: Secondary | ICD-10-CM | POA: Diagnosis not present

## 2016-07-18 DIAGNOSIS — I10 Essential (primary) hypertension: Secondary | ICD-10-CM | POA: Diagnosis not present

## 2016-07-18 DIAGNOSIS — E039 Hypothyroidism, unspecified: Secondary | ICD-10-CM | POA: Diagnosis not present

## 2016-07-18 DIAGNOSIS — I519 Heart disease, unspecified: Secondary | ICD-10-CM | POA: Diagnosis not present

## 2016-07-21 DIAGNOSIS — I519 Heart disease, unspecified: Secondary | ICD-10-CM | POA: Diagnosis not present

## 2016-07-21 DIAGNOSIS — E039 Hypothyroidism, unspecified: Secondary | ICD-10-CM | POA: Diagnosis not present

## 2016-07-21 DIAGNOSIS — I10 Essential (primary) hypertension: Secondary | ICD-10-CM | POA: Diagnosis not present

## 2016-07-21 DIAGNOSIS — K219 Gastro-esophageal reflux disease without esophagitis: Secondary | ICD-10-CM | POA: Diagnosis not present

## 2016-07-21 DIAGNOSIS — F0150 Vascular dementia without behavioral disturbance: Secondary | ICD-10-CM | POA: Diagnosis not present

## 2016-07-21 DIAGNOSIS — C3492 Malignant neoplasm of unspecified part of left bronchus or lung: Secondary | ICD-10-CM | POA: Diagnosis not present

## 2016-07-25 DIAGNOSIS — F0150 Vascular dementia without behavioral disturbance: Secondary | ICD-10-CM | POA: Diagnosis not present

## 2016-07-25 DIAGNOSIS — I10 Essential (primary) hypertension: Secondary | ICD-10-CM | POA: Diagnosis not present

## 2016-07-25 DIAGNOSIS — C3492 Malignant neoplasm of unspecified part of left bronchus or lung: Secondary | ICD-10-CM | POA: Diagnosis not present

## 2016-07-25 DIAGNOSIS — I519 Heart disease, unspecified: Secondary | ICD-10-CM | POA: Diagnosis not present

## 2016-07-25 DIAGNOSIS — K219 Gastro-esophageal reflux disease without esophagitis: Secondary | ICD-10-CM | POA: Diagnosis not present

## 2016-07-25 DIAGNOSIS — E039 Hypothyroidism, unspecified: Secondary | ICD-10-CM | POA: Diagnosis not present

## 2016-07-26 ENCOUNTER — Other Ambulatory Visit: Payer: Self-pay | Admitting: *Deleted

## 2016-07-26 NOTE — Telephone Encounter (Signed)
Clarify dose and cal in six mo worth

## 2016-07-27 DIAGNOSIS — R531 Weakness: Secondary | ICD-10-CM | POA: Diagnosis not present

## 2016-07-27 DIAGNOSIS — R630 Anorexia: Secondary | ICD-10-CM | POA: Diagnosis not present

## 2016-07-27 DIAGNOSIS — I519 Heart disease, unspecified: Secondary | ICD-10-CM | POA: Diagnosis not present

## 2016-07-27 DIAGNOSIS — I10 Essential (primary) hypertension: Secondary | ICD-10-CM | POA: Diagnosis not present

## 2016-07-27 DIAGNOSIS — R5383 Other fatigue: Secondary | ICD-10-CM | POA: Diagnosis not present

## 2016-07-27 DIAGNOSIS — R110 Nausea: Secondary | ICD-10-CM | POA: Diagnosis not present

## 2016-07-27 DIAGNOSIS — C3492 Malignant neoplasm of unspecified part of left bronchus or lung: Secondary | ICD-10-CM | POA: Diagnosis not present

## 2016-07-27 DIAGNOSIS — E039 Hypothyroidism, unspecified: Secondary | ICD-10-CM | POA: Diagnosis not present

## 2016-07-27 DIAGNOSIS — F0150 Vascular dementia without behavioral disturbance: Secondary | ICD-10-CM | POA: Diagnosis not present

## 2016-07-27 DIAGNOSIS — K219 Gastro-esophageal reflux disease without esophagitis: Secondary | ICD-10-CM | POA: Diagnosis not present

## 2016-07-27 DIAGNOSIS — R52 Pain, unspecified: Secondary | ICD-10-CM | POA: Diagnosis not present

## 2016-07-27 MED ORDER — QUETIAPINE FUMARATE 50 MG PO TABS: ORAL_TABLET | ORAL | 0 refills | Status: DC

## 2016-07-28 DIAGNOSIS — I10 Essential (primary) hypertension: Secondary | ICD-10-CM | POA: Diagnosis not present

## 2016-07-28 DIAGNOSIS — F0150 Vascular dementia without behavioral disturbance: Secondary | ICD-10-CM | POA: Diagnosis not present

## 2016-07-28 DIAGNOSIS — E039 Hypothyroidism, unspecified: Secondary | ICD-10-CM | POA: Diagnosis not present

## 2016-07-28 DIAGNOSIS — C3492 Malignant neoplasm of unspecified part of left bronchus or lung: Secondary | ICD-10-CM | POA: Diagnosis not present

## 2016-07-28 DIAGNOSIS — K219 Gastro-esophageal reflux disease without esophagitis: Secondary | ICD-10-CM | POA: Diagnosis not present

## 2016-07-28 DIAGNOSIS — I519 Heart disease, unspecified: Secondary | ICD-10-CM | POA: Diagnosis not present

## 2016-08-01 DIAGNOSIS — K219 Gastro-esophageal reflux disease without esophagitis: Secondary | ICD-10-CM | POA: Diagnosis not present

## 2016-08-01 DIAGNOSIS — C3492 Malignant neoplasm of unspecified part of left bronchus or lung: Secondary | ICD-10-CM | POA: Diagnosis not present

## 2016-08-01 DIAGNOSIS — F0150 Vascular dementia without behavioral disturbance: Secondary | ICD-10-CM | POA: Diagnosis not present

## 2016-08-01 DIAGNOSIS — E039 Hypothyroidism, unspecified: Secondary | ICD-10-CM | POA: Diagnosis not present

## 2016-08-01 DIAGNOSIS — I519 Heart disease, unspecified: Secondary | ICD-10-CM | POA: Diagnosis not present

## 2016-08-01 DIAGNOSIS — I10 Essential (primary) hypertension: Secondary | ICD-10-CM | POA: Diagnosis not present

## 2016-08-02 ENCOUNTER — Other Ambulatory Visit: Payer: Self-pay | Admitting: Family Medicine

## 2016-08-02 MED ORDER — HYDROCODONE-ACETAMINOPHEN 5-325 MG PO TABS: ORAL_TABLET | ORAL | 0 refills | Status: DC

## 2016-08-02 MED ORDER — HYDROCODONE-ACETAMINOPHEN 5-325 MG PO TABS
ORAL_TABLET | ORAL | 0 refills | Status: DC
Start: 2016-08-02 — End: 2016-08-02

## 2016-08-02 NOTE — Telephone Encounter (Signed)
Ok plus rtwo monthly ref

## 2016-08-06 ENCOUNTER — Other Ambulatory Visit: Payer: Self-pay | Admitting: Family Medicine

## 2016-08-06 DIAGNOSIS — I519 Heart disease, unspecified: Secondary | ICD-10-CM | POA: Diagnosis not present

## 2016-08-06 DIAGNOSIS — I10 Essential (primary) hypertension: Secondary | ICD-10-CM | POA: Diagnosis not present

## 2016-08-06 DIAGNOSIS — E039 Hypothyroidism, unspecified: Secondary | ICD-10-CM | POA: Diagnosis not present

## 2016-08-06 DIAGNOSIS — C3492 Malignant neoplasm of unspecified part of left bronchus or lung: Secondary | ICD-10-CM | POA: Diagnosis not present

## 2016-08-06 DIAGNOSIS — F0150 Vascular dementia without behavioral disturbance: Secondary | ICD-10-CM | POA: Diagnosis not present

## 2016-08-06 DIAGNOSIS — K219 Gastro-esophageal reflux disease without esophagitis: Secondary | ICD-10-CM | POA: Diagnosis not present

## 2016-08-08 ENCOUNTER — Other Ambulatory Visit: Payer: Self-pay | Admitting: Family Medicine

## 2016-08-08 DIAGNOSIS — K219 Gastro-esophageal reflux disease without esophagitis: Secondary | ICD-10-CM | POA: Diagnosis not present

## 2016-08-08 DIAGNOSIS — E039 Hypothyroidism, unspecified: Secondary | ICD-10-CM | POA: Diagnosis not present

## 2016-08-08 DIAGNOSIS — F0150 Vascular dementia without behavioral disturbance: Secondary | ICD-10-CM | POA: Diagnosis not present

## 2016-08-08 DIAGNOSIS — C3492 Malignant neoplasm of unspecified part of left bronchus or lung: Secondary | ICD-10-CM | POA: Diagnosis not present

## 2016-08-08 DIAGNOSIS — I10 Essential (primary) hypertension: Secondary | ICD-10-CM | POA: Diagnosis not present

## 2016-08-08 DIAGNOSIS — I519 Heart disease, unspecified: Secondary | ICD-10-CM | POA: Diagnosis not present

## 2016-08-10 DIAGNOSIS — E039 Hypothyroidism, unspecified: Secondary | ICD-10-CM | POA: Diagnosis not present

## 2016-08-10 DIAGNOSIS — I10 Essential (primary) hypertension: Secondary | ICD-10-CM | POA: Diagnosis not present

## 2016-08-10 DIAGNOSIS — K219 Gastro-esophageal reflux disease without esophagitis: Secondary | ICD-10-CM | POA: Diagnosis not present

## 2016-08-10 DIAGNOSIS — F0150 Vascular dementia without behavioral disturbance: Secondary | ICD-10-CM | POA: Diagnosis not present

## 2016-08-10 DIAGNOSIS — C3492 Malignant neoplasm of unspecified part of left bronchus or lung: Secondary | ICD-10-CM | POA: Diagnosis not present

## 2016-08-10 DIAGNOSIS — I519 Heart disease, unspecified: Secondary | ICD-10-CM | POA: Diagnosis not present

## 2016-08-15 ENCOUNTER — Other Ambulatory Visit: Payer: Self-pay | Admitting: Family Medicine

## 2016-08-15 DIAGNOSIS — I519 Heart disease, unspecified: Secondary | ICD-10-CM | POA: Diagnosis not present

## 2016-08-15 DIAGNOSIS — C3492 Malignant neoplasm of unspecified part of left bronchus or lung: Secondary | ICD-10-CM | POA: Diagnosis not present

## 2016-08-15 DIAGNOSIS — I10 Essential (primary) hypertension: Secondary | ICD-10-CM | POA: Diagnosis not present

## 2016-08-15 DIAGNOSIS — K219 Gastro-esophageal reflux disease without esophagitis: Secondary | ICD-10-CM | POA: Diagnosis not present

## 2016-08-15 DIAGNOSIS — F0150 Vascular dementia without behavioral disturbance: Secondary | ICD-10-CM | POA: Diagnosis not present

## 2016-08-15 DIAGNOSIS — E039 Hypothyroidism, unspecified: Secondary | ICD-10-CM | POA: Diagnosis not present

## 2016-08-16 DIAGNOSIS — K219 Gastro-esophageal reflux disease without esophagitis: Secondary | ICD-10-CM | POA: Diagnosis not present

## 2016-08-16 DIAGNOSIS — F0150 Vascular dementia without behavioral disturbance: Secondary | ICD-10-CM | POA: Diagnosis not present

## 2016-08-16 DIAGNOSIS — E039 Hypothyroidism, unspecified: Secondary | ICD-10-CM | POA: Diagnosis not present

## 2016-08-16 DIAGNOSIS — I10 Essential (primary) hypertension: Secondary | ICD-10-CM | POA: Diagnosis not present

## 2016-08-16 DIAGNOSIS — C3492 Malignant neoplasm of unspecified part of left bronchus or lung: Secondary | ICD-10-CM | POA: Diagnosis not present

## 2016-08-16 DIAGNOSIS — I519 Heart disease, unspecified: Secondary | ICD-10-CM | POA: Diagnosis not present

## 2016-08-16 NOTE — Telephone Encounter (Signed)
Ok six mo worth 

## 2016-08-18 DIAGNOSIS — C3492 Malignant neoplasm of unspecified part of left bronchus or lung: Secondary | ICD-10-CM | POA: Diagnosis not present

## 2016-08-18 DIAGNOSIS — F0150 Vascular dementia without behavioral disturbance: Secondary | ICD-10-CM | POA: Diagnosis not present

## 2016-08-18 DIAGNOSIS — K219 Gastro-esophageal reflux disease without esophagitis: Secondary | ICD-10-CM | POA: Diagnosis not present

## 2016-08-18 DIAGNOSIS — E039 Hypothyroidism, unspecified: Secondary | ICD-10-CM | POA: Diagnosis not present

## 2016-08-18 DIAGNOSIS — I519 Heart disease, unspecified: Secondary | ICD-10-CM | POA: Diagnosis not present

## 2016-08-18 DIAGNOSIS — I10 Essential (primary) hypertension: Secondary | ICD-10-CM | POA: Diagnosis not present

## 2016-08-21 ENCOUNTER — Other Ambulatory Visit: Payer: Self-pay

## 2016-08-21 MED ORDER — QUETIAPINE FUMARATE 50 MG PO TABS: ORAL_TABLET | ORAL | 5 refills | Status: AC

## 2016-08-21 NOTE — Telephone Encounter (Signed)
Ok six mo 

## 2016-08-21 NOTE — Telephone Encounter (Signed)
Last office visit 03/08/16

## 2016-08-22 DIAGNOSIS — E039 Hypothyroidism, unspecified: Secondary | ICD-10-CM | POA: Diagnosis not present

## 2016-08-22 DIAGNOSIS — F0150 Vascular dementia without behavioral disturbance: Secondary | ICD-10-CM | POA: Diagnosis not present

## 2016-08-22 DIAGNOSIS — I10 Essential (primary) hypertension: Secondary | ICD-10-CM | POA: Diagnosis not present

## 2016-08-22 DIAGNOSIS — C3492 Malignant neoplasm of unspecified part of left bronchus or lung: Secondary | ICD-10-CM | POA: Diagnosis not present

## 2016-08-22 DIAGNOSIS — I519 Heart disease, unspecified: Secondary | ICD-10-CM | POA: Diagnosis not present

## 2016-08-22 DIAGNOSIS — K219 Gastro-esophageal reflux disease without esophagitis: Secondary | ICD-10-CM | POA: Diagnosis not present

## 2016-08-24 DIAGNOSIS — I10 Essential (primary) hypertension: Secondary | ICD-10-CM | POA: Diagnosis not present

## 2016-08-24 DIAGNOSIS — E039 Hypothyroidism, unspecified: Secondary | ICD-10-CM | POA: Diagnosis not present

## 2016-08-24 DIAGNOSIS — F0150 Vascular dementia without behavioral disturbance: Secondary | ICD-10-CM | POA: Diagnosis not present

## 2016-08-24 DIAGNOSIS — R110 Nausea: Secondary | ICD-10-CM | POA: Diagnosis not present

## 2016-08-24 DIAGNOSIS — C3492 Malignant neoplasm of unspecified part of left bronchus or lung: Secondary | ICD-10-CM | POA: Diagnosis not present

## 2016-08-24 DIAGNOSIS — I519 Heart disease, unspecified: Secondary | ICD-10-CM | POA: Diagnosis not present

## 2016-08-24 DIAGNOSIS — R630 Anorexia: Secondary | ICD-10-CM | POA: Diagnosis not present

## 2016-08-24 DIAGNOSIS — K219 Gastro-esophageal reflux disease without esophagitis: Secondary | ICD-10-CM | POA: Diagnosis not present

## 2016-08-24 DIAGNOSIS — R52 Pain, unspecified: Secondary | ICD-10-CM | POA: Diagnosis not present

## 2016-08-24 DIAGNOSIS — R5383 Other fatigue: Secondary | ICD-10-CM | POA: Diagnosis not present

## 2016-08-24 DIAGNOSIS — R531 Weakness: Secondary | ICD-10-CM | POA: Diagnosis not present

## 2016-08-25 DIAGNOSIS — I519 Heart disease, unspecified: Secondary | ICD-10-CM | POA: Diagnosis not present

## 2016-08-25 DIAGNOSIS — F0150 Vascular dementia without behavioral disturbance: Secondary | ICD-10-CM | POA: Diagnosis not present

## 2016-08-25 DIAGNOSIS — K219 Gastro-esophageal reflux disease without esophagitis: Secondary | ICD-10-CM | POA: Diagnosis not present

## 2016-08-25 DIAGNOSIS — I10 Essential (primary) hypertension: Secondary | ICD-10-CM | POA: Diagnosis not present

## 2016-08-25 DIAGNOSIS — E039 Hypothyroidism, unspecified: Secondary | ICD-10-CM | POA: Diagnosis not present

## 2016-08-25 DIAGNOSIS — C3492 Malignant neoplasm of unspecified part of left bronchus or lung: Secondary | ICD-10-CM | POA: Diagnosis not present

## 2016-08-29 DIAGNOSIS — I10 Essential (primary) hypertension: Secondary | ICD-10-CM | POA: Diagnosis not present

## 2016-08-29 DIAGNOSIS — I519 Heart disease, unspecified: Secondary | ICD-10-CM | POA: Diagnosis not present

## 2016-08-29 DIAGNOSIS — F0150 Vascular dementia without behavioral disturbance: Secondary | ICD-10-CM | POA: Diagnosis not present

## 2016-08-29 DIAGNOSIS — C3492 Malignant neoplasm of unspecified part of left bronchus or lung: Secondary | ICD-10-CM | POA: Diagnosis not present

## 2016-08-29 DIAGNOSIS — K219 Gastro-esophageal reflux disease without esophagitis: Secondary | ICD-10-CM | POA: Diagnosis not present

## 2016-08-29 DIAGNOSIS — E039 Hypothyroidism, unspecified: Secondary | ICD-10-CM | POA: Diagnosis not present

## 2016-09-01 DIAGNOSIS — E039 Hypothyroidism, unspecified: Secondary | ICD-10-CM | POA: Diagnosis not present

## 2016-09-01 DIAGNOSIS — C3492 Malignant neoplasm of unspecified part of left bronchus or lung: Secondary | ICD-10-CM | POA: Diagnosis not present

## 2016-09-01 DIAGNOSIS — I10 Essential (primary) hypertension: Secondary | ICD-10-CM | POA: Diagnosis not present

## 2016-09-01 DIAGNOSIS — I519 Heart disease, unspecified: Secondary | ICD-10-CM | POA: Diagnosis not present

## 2016-09-01 DIAGNOSIS — F0150 Vascular dementia without behavioral disturbance: Secondary | ICD-10-CM | POA: Diagnosis not present

## 2016-09-01 DIAGNOSIS — K219 Gastro-esophageal reflux disease without esophagitis: Secondary | ICD-10-CM | POA: Diagnosis not present

## 2016-09-06 DIAGNOSIS — E039 Hypothyroidism, unspecified: Secondary | ICD-10-CM | POA: Diagnosis not present

## 2016-09-06 DIAGNOSIS — I10 Essential (primary) hypertension: Secondary | ICD-10-CM | POA: Diagnosis not present

## 2016-09-06 DIAGNOSIS — I519 Heart disease, unspecified: Secondary | ICD-10-CM | POA: Diagnosis not present

## 2016-09-06 DIAGNOSIS — C3492 Malignant neoplasm of unspecified part of left bronchus or lung: Secondary | ICD-10-CM | POA: Diagnosis not present

## 2016-09-06 DIAGNOSIS — F0150 Vascular dementia without behavioral disturbance: Secondary | ICD-10-CM | POA: Diagnosis not present

## 2016-09-06 DIAGNOSIS — K219 Gastro-esophageal reflux disease without esophagitis: Secondary | ICD-10-CM | POA: Diagnosis not present

## 2016-09-12 DIAGNOSIS — I10 Essential (primary) hypertension: Secondary | ICD-10-CM | POA: Diagnosis not present

## 2016-09-12 DIAGNOSIS — C3492 Malignant neoplasm of unspecified part of left bronchus or lung: Secondary | ICD-10-CM | POA: Diagnosis not present

## 2016-09-12 DIAGNOSIS — F0150 Vascular dementia without behavioral disturbance: Secondary | ICD-10-CM | POA: Diagnosis not present

## 2016-09-12 DIAGNOSIS — I519 Heart disease, unspecified: Secondary | ICD-10-CM | POA: Diagnosis not present

## 2016-09-12 DIAGNOSIS — E039 Hypothyroidism, unspecified: Secondary | ICD-10-CM | POA: Diagnosis not present

## 2016-09-12 DIAGNOSIS — K219 Gastro-esophageal reflux disease without esophagitis: Secondary | ICD-10-CM | POA: Diagnosis not present

## 2016-09-13 DIAGNOSIS — E039 Hypothyroidism, unspecified: Secondary | ICD-10-CM | POA: Diagnosis not present

## 2016-09-13 DIAGNOSIS — F0150 Vascular dementia without behavioral disturbance: Secondary | ICD-10-CM | POA: Diagnosis not present

## 2016-09-13 DIAGNOSIS — K219 Gastro-esophageal reflux disease without esophagitis: Secondary | ICD-10-CM | POA: Diagnosis not present

## 2016-09-13 DIAGNOSIS — I10 Essential (primary) hypertension: Secondary | ICD-10-CM | POA: Diagnosis not present

## 2016-09-13 DIAGNOSIS — I519 Heart disease, unspecified: Secondary | ICD-10-CM | POA: Diagnosis not present

## 2016-09-13 DIAGNOSIS — C3492 Malignant neoplasm of unspecified part of left bronchus or lung: Secondary | ICD-10-CM | POA: Diagnosis not present

## 2016-09-15 DIAGNOSIS — E039 Hypothyroidism, unspecified: Secondary | ICD-10-CM | POA: Diagnosis not present

## 2016-09-15 DIAGNOSIS — I10 Essential (primary) hypertension: Secondary | ICD-10-CM | POA: Diagnosis not present

## 2016-09-15 DIAGNOSIS — K219 Gastro-esophageal reflux disease without esophagitis: Secondary | ICD-10-CM | POA: Diagnosis not present

## 2016-09-15 DIAGNOSIS — C3492 Malignant neoplasm of unspecified part of left bronchus or lung: Secondary | ICD-10-CM | POA: Diagnosis not present

## 2016-09-15 DIAGNOSIS — F0150 Vascular dementia without behavioral disturbance: Secondary | ICD-10-CM | POA: Diagnosis not present

## 2016-09-15 DIAGNOSIS — I519 Heart disease, unspecified: Secondary | ICD-10-CM | POA: Diagnosis not present

## 2016-09-19 DIAGNOSIS — C3492 Malignant neoplasm of unspecified part of left bronchus or lung: Secondary | ICD-10-CM | POA: Diagnosis not present

## 2016-09-19 DIAGNOSIS — F0150 Vascular dementia without behavioral disturbance: Secondary | ICD-10-CM | POA: Diagnosis not present

## 2016-09-19 DIAGNOSIS — K219 Gastro-esophageal reflux disease without esophagitis: Secondary | ICD-10-CM | POA: Diagnosis not present

## 2016-09-19 DIAGNOSIS — I519 Heart disease, unspecified: Secondary | ICD-10-CM | POA: Diagnosis not present

## 2016-09-19 DIAGNOSIS — I10 Essential (primary) hypertension: Secondary | ICD-10-CM | POA: Diagnosis not present

## 2016-09-19 DIAGNOSIS — E039 Hypothyroidism, unspecified: Secondary | ICD-10-CM | POA: Diagnosis not present

## 2016-09-21 ENCOUNTER — Other Ambulatory Visit: Payer: Self-pay | Admitting: Family Medicine

## 2016-09-21 DIAGNOSIS — E039 Hypothyroidism, unspecified: Secondary | ICD-10-CM | POA: Diagnosis not present

## 2016-09-21 DIAGNOSIS — I10 Essential (primary) hypertension: Secondary | ICD-10-CM | POA: Diagnosis not present

## 2016-09-21 DIAGNOSIS — F0150 Vascular dementia without behavioral disturbance: Secondary | ICD-10-CM | POA: Diagnosis not present

## 2016-09-21 DIAGNOSIS — C3492 Malignant neoplasm of unspecified part of left bronchus or lung: Secondary | ICD-10-CM | POA: Diagnosis not present

## 2016-09-21 DIAGNOSIS — I519 Heart disease, unspecified: Secondary | ICD-10-CM | POA: Diagnosis not present

## 2016-09-21 DIAGNOSIS — K219 Gastro-esophageal reflux disease without esophagitis: Secondary | ICD-10-CM | POA: Diagnosis not present

## 2016-09-22 DIAGNOSIS — I519 Heart disease, unspecified: Secondary | ICD-10-CM | POA: Diagnosis not present

## 2016-09-22 DIAGNOSIS — C3492 Malignant neoplasm of unspecified part of left bronchus or lung: Secondary | ICD-10-CM | POA: Diagnosis not present

## 2016-09-22 DIAGNOSIS — K219 Gastro-esophageal reflux disease without esophagitis: Secondary | ICD-10-CM | POA: Diagnosis not present

## 2016-09-22 DIAGNOSIS — E039 Hypothyroidism, unspecified: Secondary | ICD-10-CM | POA: Diagnosis not present

## 2016-09-22 DIAGNOSIS — I10 Essential (primary) hypertension: Secondary | ICD-10-CM | POA: Diagnosis not present

## 2016-09-22 DIAGNOSIS — F0150 Vascular dementia without behavioral disturbance: Secondary | ICD-10-CM | POA: Diagnosis not present

## 2016-09-24 DIAGNOSIS — C3492 Malignant neoplasm of unspecified part of left bronchus or lung: Secondary | ICD-10-CM | POA: Diagnosis not present

## 2016-09-24 DIAGNOSIS — I519 Heart disease, unspecified: Secondary | ICD-10-CM | POA: Diagnosis not present

## 2016-09-24 DIAGNOSIS — R630 Anorexia: Secondary | ICD-10-CM | POA: Diagnosis not present

## 2016-09-24 DIAGNOSIS — R5383 Other fatigue: Secondary | ICD-10-CM | POA: Diagnosis not present

## 2016-09-24 DIAGNOSIS — I10 Essential (primary) hypertension: Secondary | ICD-10-CM | POA: Diagnosis not present

## 2016-09-24 DIAGNOSIS — K219 Gastro-esophageal reflux disease without esophagitis: Secondary | ICD-10-CM | POA: Diagnosis not present

## 2016-09-24 DIAGNOSIS — F0150 Vascular dementia without behavioral disturbance: Secondary | ICD-10-CM | POA: Diagnosis not present

## 2016-09-24 DIAGNOSIS — E039 Hypothyroidism, unspecified: Secondary | ICD-10-CM | POA: Diagnosis not present

## 2016-09-24 DIAGNOSIS — R52 Pain, unspecified: Secondary | ICD-10-CM | POA: Diagnosis not present

## 2016-09-24 DIAGNOSIS — R110 Nausea: Secondary | ICD-10-CM | POA: Diagnosis not present

## 2016-09-24 DIAGNOSIS — R531 Weakness: Secondary | ICD-10-CM | POA: Diagnosis not present

## 2016-09-26 DIAGNOSIS — E039 Hypothyroidism, unspecified: Secondary | ICD-10-CM | POA: Diagnosis not present

## 2016-09-26 DIAGNOSIS — K219 Gastro-esophageal reflux disease without esophagitis: Secondary | ICD-10-CM | POA: Diagnosis not present

## 2016-09-26 DIAGNOSIS — I519 Heart disease, unspecified: Secondary | ICD-10-CM | POA: Diagnosis not present

## 2016-09-26 DIAGNOSIS — I10 Essential (primary) hypertension: Secondary | ICD-10-CM | POA: Diagnosis not present

## 2016-09-26 DIAGNOSIS — C3492 Malignant neoplasm of unspecified part of left bronchus or lung: Secondary | ICD-10-CM | POA: Diagnosis not present

## 2016-09-26 DIAGNOSIS — F0150 Vascular dementia without behavioral disturbance: Secondary | ICD-10-CM | POA: Diagnosis not present

## 2016-09-29 ENCOUNTER — Other Ambulatory Visit: Payer: Self-pay | Admitting: Family Medicine

## 2016-09-29 DIAGNOSIS — K219 Gastro-esophageal reflux disease without esophagitis: Secondary | ICD-10-CM | POA: Diagnosis not present

## 2016-09-29 DIAGNOSIS — I10 Essential (primary) hypertension: Secondary | ICD-10-CM | POA: Diagnosis not present

## 2016-09-29 DIAGNOSIS — F0150 Vascular dementia without behavioral disturbance: Secondary | ICD-10-CM | POA: Diagnosis not present

## 2016-09-29 DIAGNOSIS — I519 Heart disease, unspecified: Secondary | ICD-10-CM | POA: Diagnosis not present

## 2016-09-29 DIAGNOSIS — E039 Hypothyroidism, unspecified: Secondary | ICD-10-CM | POA: Diagnosis not present

## 2016-09-29 DIAGNOSIS — C3492 Malignant neoplasm of unspecified part of left bronchus or lung: Secondary | ICD-10-CM | POA: Diagnosis not present

## 2016-10-03 DIAGNOSIS — E039 Hypothyroidism, unspecified: Secondary | ICD-10-CM | POA: Diagnosis not present

## 2016-10-03 DIAGNOSIS — F0150 Vascular dementia without behavioral disturbance: Secondary | ICD-10-CM | POA: Diagnosis not present

## 2016-10-03 DIAGNOSIS — I519 Heart disease, unspecified: Secondary | ICD-10-CM | POA: Diagnosis not present

## 2016-10-03 DIAGNOSIS — K219 Gastro-esophageal reflux disease without esophagitis: Secondary | ICD-10-CM | POA: Diagnosis not present

## 2016-10-03 DIAGNOSIS — C3492 Malignant neoplasm of unspecified part of left bronchus or lung: Secondary | ICD-10-CM | POA: Diagnosis not present

## 2016-10-03 DIAGNOSIS — I10 Essential (primary) hypertension: Secondary | ICD-10-CM | POA: Diagnosis not present

## 2016-10-06 DIAGNOSIS — I10 Essential (primary) hypertension: Secondary | ICD-10-CM | POA: Diagnosis not present

## 2016-10-06 DIAGNOSIS — C3492 Malignant neoplasm of unspecified part of left bronchus or lung: Secondary | ICD-10-CM | POA: Diagnosis not present

## 2016-10-06 DIAGNOSIS — F0150 Vascular dementia without behavioral disturbance: Secondary | ICD-10-CM | POA: Diagnosis not present

## 2016-10-06 DIAGNOSIS — E039 Hypothyroidism, unspecified: Secondary | ICD-10-CM | POA: Diagnosis not present

## 2016-10-06 DIAGNOSIS — K219 Gastro-esophageal reflux disease without esophagitis: Secondary | ICD-10-CM | POA: Diagnosis not present

## 2016-10-06 DIAGNOSIS — I519 Heart disease, unspecified: Secondary | ICD-10-CM | POA: Diagnosis not present

## 2016-10-10 DIAGNOSIS — F0150 Vascular dementia without behavioral disturbance: Secondary | ICD-10-CM | POA: Diagnosis not present

## 2016-10-10 DIAGNOSIS — I519 Heart disease, unspecified: Secondary | ICD-10-CM | POA: Diagnosis not present

## 2016-10-10 DIAGNOSIS — K219 Gastro-esophageal reflux disease without esophagitis: Secondary | ICD-10-CM | POA: Diagnosis not present

## 2016-10-10 DIAGNOSIS — C3492 Malignant neoplasm of unspecified part of left bronchus or lung: Secondary | ICD-10-CM | POA: Diagnosis not present

## 2016-10-10 DIAGNOSIS — E039 Hypothyroidism, unspecified: Secondary | ICD-10-CM | POA: Diagnosis not present

## 2016-10-10 DIAGNOSIS — I10 Essential (primary) hypertension: Secondary | ICD-10-CM | POA: Diagnosis not present

## 2016-10-11 ENCOUNTER — Other Ambulatory Visit: Payer: Self-pay | Admitting: Family Medicine

## 2016-10-13 DIAGNOSIS — I519 Heart disease, unspecified: Secondary | ICD-10-CM | POA: Diagnosis not present

## 2016-10-13 DIAGNOSIS — K219 Gastro-esophageal reflux disease without esophagitis: Secondary | ICD-10-CM | POA: Diagnosis not present

## 2016-10-13 DIAGNOSIS — C3492 Malignant neoplasm of unspecified part of left bronchus or lung: Secondary | ICD-10-CM | POA: Diagnosis not present

## 2016-10-13 DIAGNOSIS — F0150 Vascular dementia without behavioral disturbance: Secondary | ICD-10-CM | POA: Diagnosis not present

## 2016-10-13 DIAGNOSIS — E039 Hypothyroidism, unspecified: Secondary | ICD-10-CM | POA: Diagnosis not present

## 2016-10-13 DIAGNOSIS — I10 Essential (primary) hypertension: Secondary | ICD-10-CM | POA: Diagnosis not present

## 2016-10-16 ENCOUNTER — Other Ambulatory Visit: Payer: Self-pay | Admitting: Student

## 2016-10-17 ENCOUNTER — Other Ambulatory Visit (HOSPITAL_COMMUNITY): Payer: Medicare Other

## 2016-10-17 DIAGNOSIS — I519 Heart disease, unspecified: Secondary | ICD-10-CM | POA: Diagnosis not present

## 2016-10-17 DIAGNOSIS — I10 Essential (primary) hypertension: Secondary | ICD-10-CM | POA: Diagnosis not present

## 2016-10-17 DIAGNOSIS — F0150 Vascular dementia without behavioral disturbance: Secondary | ICD-10-CM | POA: Diagnosis not present

## 2016-10-17 DIAGNOSIS — E039 Hypothyroidism, unspecified: Secondary | ICD-10-CM | POA: Diagnosis not present

## 2016-10-17 DIAGNOSIS — C3492 Malignant neoplasm of unspecified part of left bronchus or lung: Secondary | ICD-10-CM | POA: Diagnosis not present

## 2016-10-17 DIAGNOSIS — K219 Gastro-esophageal reflux disease without esophagitis: Secondary | ICD-10-CM | POA: Diagnosis not present

## 2016-10-20 DIAGNOSIS — F0150 Vascular dementia without behavioral disturbance: Secondary | ICD-10-CM | POA: Diagnosis not present

## 2016-10-20 DIAGNOSIS — E039 Hypothyroidism, unspecified: Secondary | ICD-10-CM | POA: Diagnosis not present

## 2016-10-20 DIAGNOSIS — K219 Gastro-esophageal reflux disease without esophagitis: Secondary | ICD-10-CM | POA: Diagnosis not present

## 2016-10-20 DIAGNOSIS — C3492 Malignant neoplasm of unspecified part of left bronchus or lung: Secondary | ICD-10-CM | POA: Diagnosis not present

## 2016-10-20 DIAGNOSIS — I10 Essential (primary) hypertension: Secondary | ICD-10-CM | POA: Diagnosis not present

## 2016-10-20 DIAGNOSIS — I519 Heart disease, unspecified: Secondary | ICD-10-CM | POA: Diagnosis not present

## 2016-10-24 DIAGNOSIS — I519 Heart disease, unspecified: Secondary | ICD-10-CM | POA: Diagnosis not present

## 2016-10-24 DIAGNOSIS — E039 Hypothyroidism, unspecified: Secondary | ICD-10-CM | POA: Diagnosis not present

## 2016-10-24 DIAGNOSIS — R5383 Other fatigue: Secondary | ICD-10-CM | POA: Diagnosis not present

## 2016-10-24 DIAGNOSIS — F0150 Vascular dementia without behavioral disturbance: Secondary | ICD-10-CM | POA: Diagnosis not present

## 2016-10-24 DIAGNOSIS — C3492 Malignant neoplasm of unspecified part of left bronchus or lung: Secondary | ICD-10-CM | POA: Diagnosis not present

## 2016-10-24 DIAGNOSIS — I10 Essential (primary) hypertension: Secondary | ICD-10-CM | POA: Diagnosis not present

## 2016-10-24 DIAGNOSIS — K219 Gastro-esophageal reflux disease without esophagitis: Secondary | ICD-10-CM | POA: Diagnosis not present

## 2016-10-24 DIAGNOSIS — R110 Nausea: Secondary | ICD-10-CM | POA: Diagnosis not present

## 2016-10-24 DIAGNOSIS — R630 Anorexia: Secondary | ICD-10-CM | POA: Diagnosis not present

## 2016-10-24 DIAGNOSIS — R52 Pain, unspecified: Secondary | ICD-10-CM | POA: Diagnosis not present

## 2016-10-24 DIAGNOSIS — R531 Weakness: Secondary | ICD-10-CM | POA: Diagnosis not present

## 2016-10-26 DIAGNOSIS — F0150 Vascular dementia without behavioral disturbance: Secondary | ICD-10-CM | POA: Diagnosis not present

## 2016-10-26 DIAGNOSIS — I519 Heart disease, unspecified: Secondary | ICD-10-CM | POA: Diagnosis not present

## 2016-10-26 DIAGNOSIS — K219 Gastro-esophageal reflux disease without esophagitis: Secondary | ICD-10-CM | POA: Diagnosis not present

## 2016-10-26 DIAGNOSIS — E039 Hypothyroidism, unspecified: Secondary | ICD-10-CM | POA: Diagnosis not present

## 2016-10-26 DIAGNOSIS — C3492 Malignant neoplasm of unspecified part of left bronchus or lung: Secondary | ICD-10-CM | POA: Diagnosis not present

## 2016-10-26 DIAGNOSIS — I10 Essential (primary) hypertension: Secondary | ICD-10-CM | POA: Diagnosis not present

## 2016-10-27 DIAGNOSIS — I10 Essential (primary) hypertension: Secondary | ICD-10-CM | POA: Diagnosis not present

## 2016-10-27 DIAGNOSIS — K219 Gastro-esophageal reflux disease without esophagitis: Secondary | ICD-10-CM | POA: Diagnosis not present

## 2016-10-27 DIAGNOSIS — I519 Heart disease, unspecified: Secondary | ICD-10-CM | POA: Diagnosis not present

## 2016-10-27 DIAGNOSIS — C3492 Malignant neoplasm of unspecified part of left bronchus or lung: Secondary | ICD-10-CM | POA: Diagnosis not present

## 2016-10-27 DIAGNOSIS — F0150 Vascular dementia without behavioral disturbance: Secondary | ICD-10-CM | POA: Diagnosis not present

## 2016-10-27 DIAGNOSIS — E039 Hypothyroidism, unspecified: Secondary | ICD-10-CM | POA: Diagnosis not present

## 2016-10-31 DIAGNOSIS — I519 Heart disease, unspecified: Secondary | ICD-10-CM | POA: Diagnosis not present

## 2016-10-31 DIAGNOSIS — E039 Hypothyroidism, unspecified: Secondary | ICD-10-CM | POA: Diagnosis not present

## 2016-10-31 DIAGNOSIS — F0150 Vascular dementia without behavioral disturbance: Secondary | ICD-10-CM | POA: Diagnosis not present

## 2016-10-31 DIAGNOSIS — K219 Gastro-esophageal reflux disease without esophagitis: Secondary | ICD-10-CM | POA: Diagnosis not present

## 2016-10-31 DIAGNOSIS — C3492 Malignant neoplasm of unspecified part of left bronchus or lung: Secondary | ICD-10-CM | POA: Diagnosis not present

## 2016-10-31 DIAGNOSIS — I10 Essential (primary) hypertension: Secondary | ICD-10-CM | POA: Diagnosis not present

## 2016-11-03 DIAGNOSIS — I519 Heart disease, unspecified: Secondary | ICD-10-CM | POA: Diagnosis not present

## 2016-11-03 DIAGNOSIS — E039 Hypothyroidism, unspecified: Secondary | ICD-10-CM | POA: Diagnosis not present

## 2016-11-03 DIAGNOSIS — I10 Essential (primary) hypertension: Secondary | ICD-10-CM | POA: Diagnosis not present

## 2016-11-03 DIAGNOSIS — F0150 Vascular dementia without behavioral disturbance: Secondary | ICD-10-CM | POA: Diagnosis not present

## 2016-11-03 DIAGNOSIS — K219 Gastro-esophageal reflux disease without esophagitis: Secondary | ICD-10-CM | POA: Diagnosis not present

## 2016-11-03 DIAGNOSIS — C3492 Malignant neoplasm of unspecified part of left bronchus or lung: Secondary | ICD-10-CM | POA: Diagnosis not present

## 2016-11-06 ENCOUNTER — Ambulatory Visit (HOSPITAL_COMMUNITY)
Admission: RE | Admit: 2016-11-06 | Discharge: 2016-11-06 | Disposition: A | Discharge status: Home / Self Care | Payer: Medicare Other | Source: Ambulatory Visit | Attending: Radiology | Admitting: Radiology

## 2016-11-06 ENCOUNTER — Other Ambulatory Visit (HOSPITAL_COMMUNITY): Payer: Self-pay | Admitting: Interventional Radiology

## 2016-11-06 ENCOUNTER — Encounter (HOSPITAL_COMMUNITY): Payer: Self-pay | Admitting: Interventional Radiology

## 2016-11-06 DIAGNOSIS — Z4803 Encounter for change or removal of drains: Secondary | ICD-10-CM | POA: Insufficient documentation

## 2016-11-06 DIAGNOSIS — T85590A Other mechanical complication of bile duct prosthesis, initial encounter: Secondary | ICD-10-CM | POA: Diagnosis not present

## 2016-11-06 DIAGNOSIS — K810 Acute cholecystitis: Secondary | ICD-10-CM

## 2016-11-06 HISTORY — PX: IR CATHETER TUBE CHANGE: IMG717

## 2016-11-06 MED ORDER — IOPAMIDOL (ISOVUE-300) INJECTION 61%
INTRAVENOUS | Status: AC
  Administered 2016-11-06: 10 mL
  Filled 2016-11-06: qty 50

## 2016-11-06 MED ORDER — LIDOCAINE-EPINEPHRINE (PF) 2 %-1:200000 IJ SOLN
INTRAMUSCULAR | Status: AC | PRN
  Administered 2016-11-06: 10 mL

## 2016-11-06 MED ORDER — LIDOCAINE-EPINEPHRINE (PF) 2 %-1:200000 IJ SOLN
INTRAMUSCULAR | Status: AC
  Filled 2016-11-06: qty 20

## 2016-11-06 MED ORDER — IOPAMIDOL (ISOVUE-300) INJECTION 61%
INTRAVENOUS | Status: AC
  Filled 2016-11-06: qty 50

## 2016-11-06 MED ORDER — IOPAMIDOL (ISOVUE-300) INJECTION 61%
10.0000 mL | Freq: Once | INTRAVENOUS | Status: AC | PRN
  Administered 2016-11-06: 10 mL

## 2016-11-06 NOTE — Procedures (Signed)
Interventional Radiology Procedure Note  Procedure: Percutaneous cholecystostomy tube change  Complications: None  Estimated Blood Loss: None  10 Fr pigtail drain exchange in gallbladder.  Capped.  Venetia Night. Kathlene Cote, M.D Pager:  [phone]

## 2016-11-07 DIAGNOSIS — C3492 Malignant neoplasm of unspecified part of left bronchus or lung: Secondary | ICD-10-CM | POA: Diagnosis not present

## 2016-11-07 DIAGNOSIS — E039 Hypothyroidism, unspecified: Secondary | ICD-10-CM | POA: Diagnosis not present

## 2016-11-07 DIAGNOSIS — I10 Essential (primary) hypertension: Secondary | ICD-10-CM | POA: Diagnosis not present

## 2016-11-07 DIAGNOSIS — K219 Gastro-esophageal reflux disease without esophagitis: Secondary | ICD-10-CM | POA: Diagnosis not present

## 2016-11-07 DIAGNOSIS — I519 Heart disease, unspecified: Secondary | ICD-10-CM | POA: Diagnosis not present

## 2016-11-07 DIAGNOSIS — F0150 Vascular dementia without behavioral disturbance: Secondary | ICD-10-CM | POA: Diagnosis not present

## 2016-11-08 DIAGNOSIS — F0150 Vascular dementia without behavioral disturbance: Secondary | ICD-10-CM | POA: Diagnosis not present

## 2016-11-08 DIAGNOSIS — C3492 Malignant neoplasm of unspecified part of left bronchus or lung: Secondary | ICD-10-CM | POA: Diagnosis not present

## 2016-11-08 DIAGNOSIS — I10 Essential (primary) hypertension: Secondary | ICD-10-CM | POA: Diagnosis not present

## 2016-11-08 DIAGNOSIS — I519 Heart disease, unspecified: Secondary | ICD-10-CM | POA: Diagnosis not present

## 2016-11-08 DIAGNOSIS — K219 Gastro-esophageal reflux disease without esophagitis: Secondary | ICD-10-CM | POA: Diagnosis not present

## 2016-11-08 DIAGNOSIS — E039 Hypothyroidism, unspecified: Secondary | ICD-10-CM | POA: Diagnosis not present

## 2016-11-10 DIAGNOSIS — F0150 Vascular dementia without behavioral disturbance: Secondary | ICD-10-CM | POA: Diagnosis not present

## 2016-11-10 DIAGNOSIS — I519 Heart disease, unspecified: Secondary | ICD-10-CM | POA: Diagnosis not present

## 2016-11-10 DIAGNOSIS — C3492 Malignant neoplasm of unspecified part of left bronchus or lung: Secondary | ICD-10-CM | POA: Diagnosis not present

## 2016-11-10 DIAGNOSIS — E039 Hypothyroidism, unspecified: Secondary | ICD-10-CM | POA: Diagnosis not present

## 2016-11-10 DIAGNOSIS — K219 Gastro-esophageal reflux disease without esophagitis: Secondary | ICD-10-CM | POA: Diagnosis not present

## 2016-11-10 DIAGNOSIS — I10 Essential (primary) hypertension: Secondary | ICD-10-CM | POA: Diagnosis not present

## 2016-11-14 ENCOUNTER — Other Ambulatory Visit: Payer: Self-pay | Admitting: Family Medicine

## 2016-11-14 ENCOUNTER — Other Ambulatory Visit: Payer: Self-pay | Admitting: *Deleted

## 2016-11-14 DIAGNOSIS — F0150 Vascular dementia without behavioral disturbance: Secondary | ICD-10-CM | POA: Diagnosis not present

## 2016-11-14 DIAGNOSIS — E039 Hypothyroidism, unspecified: Secondary | ICD-10-CM | POA: Diagnosis not present

## 2016-11-14 DIAGNOSIS — I519 Heart disease, unspecified: Secondary | ICD-10-CM | POA: Diagnosis not present

## 2016-11-14 DIAGNOSIS — I10 Essential (primary) hypertension: Secondary | ICD-10-CM | POA: Diagnosis not present

## 2016-11-14 DIAGNOSIS — C3492 Malignant neoplasm of unspecified part of left bronchus or lung: Secondary | ICD-10-CM | POA: Diagnosis not present

## 2016-11-14 DIAGNOSIS — K219 Gastro-esophageal reflux disease without esophagitis: Secondary | ICD-10-CM | POA: Diagnosis not present

## 2016-11-14 MED ORDER — HYDROCODONE-ACETAMINOPHEN 5-325 MG PO TABS: ORAL_TABLET | ORAL | 0 refills | Status: AC

## 2016-11-14 NOTE — Telephone Encounter (Signed)
Ok plus 2 ref 

## 2016-11-17 DIAGNOSIS — I10 Essential (primary) hypertension: Secondary | ICD-10-CM | POA: Diagnosis not present

## 2016-11-17 DIAGNOSIS — K219 Gastro-esophageal reflux disease without esophagitis: Secondary | ICD-10-CM | POA: Diagnosis not present

## 2016-11-17 DIAGNOSIS — C3492 Malignant neoplasm of unspecified part of left bronchus or lung: Secondary | ICD-10-CM | POA: Diagnosis not present

## 2016-11-17 DIAGNOSIS — F0150 Vascular dementia without behavioral disturbance: Secondary | ICD-10-CM | POA: Diagnosis not present

## 2016-11-17 DIAGNOSIS — E039 Hypothyroidism, unspecified: Secondary | ICD-10-CM | POA: Diagnosis not present

## 2016-11-17 DIAGNOSIS — I519 Heart disease, unspecified: Secondary | ICD-10-CM | POA: Diagnosis not present

## 2016-11-21 ENCOUNTER — Other Ambulatory Visit: Payer: Self-pay | Admitting: *Deleted

## 2016-11-21 DIAGNOSIS — I519 Heart disease, unspecified: Secondary | ICD-10-CM | POA: Diagnosis not present

## 2016-11-21 DIAGNOSIS — I10 Essential (primary) hypertension: Secondary | ICD-10-CM | POA: Diagnosis not present

## 2016-11-21 DIAGNOSIS — C3492 Malignant neoplasm of unspecified part of left bronchus or lung: Secondary | ICD-10-CM | POA: Diagnosis not present

## 2016-11-21 DIAGNOSIS — K219 Gastro-esophageal reflux disease without esophagitis: Secondary | ICD-10-CM | POA: Diagnosis not present

## 2016-11-21 DIAGNOSIS — E039 Hypothyroidism, unspecified: Secondary | ICD-10-CM | POA: Diagnosis not present

## 2016-11-21 DIAGNOSIS — F0150 Vascular dementia without behavioral disturbance: Secondary | ICD-10-CM | POA: Diagnosis not present

## 2016-11-21 NOTE — Telephone Encounter (Signed)
Ok six mo worth 

## 2016-11-22 ENCOUNTER — Other Ambulatory Visit: Payer: Self-pay | Admitting: Family Medicine

## 2016-11-22 MED ORDER — ALPRAZOLAM 1 MG PO TABS: ORAL_TABLET | ORAL | 5 refills | Status: AC

## 2016-11-23 DIAGNOSIS — I519 Heart disease, unspecified: Secondary | ICD-10-CM | POA: Diagnosis not present

## 2016-11-23 DIAGNOSIS — K219 Gastro-esophageal reflux disease without esophagitis: Secondary | ICD-10-CM | POA: Diagnosis not present

## 2016-11-23 DIAGNOSIS — I10 Essential (primary) hypertension: Secondary | ICD-10-CM | POA: Diagnosis not present

## 2016-11-23 DIAGNOSIS — E039 Hypothyroidism, unspecified: Secondary | ICD-10-CM | POA: Diagnosis not present

## 2016-11-23 DIAGNOSIS — C3492 Malignant neoplasm of unspecified part of left bronchus or lung: Secondary | ICD-10-CM | POA: Diagnosis not present

## 2016-11-23 DIAGNOSIS — F0150 Vascular dementia without behavioral disturbance: Secondary | ICD-10-CM | POA: Diagnosis not present

## 2016-11-24 DIAGNOSIS — R110 Nausea: Secondary | ICD-10-CM | POA: Diagnosis not present

## 2016-11-24 DIAGNOSIS — R5383 Other fatigue: Secondary | ICD-10-CM | POA: Diagnosis not present

## 2016-11-24 DIAGNOSIS — C3492 Malignant neoplasm of unspecified part of left bronchus or lung: Secondary | ICD-10-CM | POA: Diagnosis not present

## 2016-11-24 DIAGNOSIS — R52 Pain, unspecified: Secondary | ICD-10-CM | POA: Diagnosis not present

## 2016-11-24 DIAGNOSIS — F0150 Vascular dementia without behavioral disturbance: Secondary | ICD-10-CM | POA: Diagnosis not present

## 2016-11-24 DIAGNOSIS — E039 Hypothyroidism, unspecified: Secondary | ICD-10-CM | POA: Diagnosis not present

## 2016-11-24 DIAGNOSIS — K219 Gastro-esophageal reflux disease without esophagitis: Secondary | ICD-10-CM | POA: Diagnosis not present

## 2016-11-24 DIAGNOSIS — R630 Anorexia: Secondary | ICD-10-CM | POA: Diagnosis not present

## 2016-11-24 DIAGNOSIS — I10 Essential (primary) hypertension: Secondary | ICD-10-CM | POA: Diagnosis not present

## 2016-11-24 DIAGNOSIS — I519 Heart disease, unspecified: Secondary | ICD-10-CM | POA: Diagnosis not present

## 2016-11-24 DIAGNOSIS — R531 Weakness: Secondary | ICD-10-CM | POA: Diagnosis not present

## 2016-11-28 DIAGNOSIS — I519 Heart disease, unspecified: Secondary | ICD-10-CM | POA: Diagnosis not present

## 2016-11-28 DIAGNOSIS — K219 Gastro-esophageal reflux disease without esophagitis: Secondary | ICD-10-CM | POA: Diagnosis not present

## 2016-11-28 DIAGNOSIS — F0150 Vascular dementia without behavioral disturbance: Secondary | ICD-10-CM | POA: Diagnosis not present

## 2016-11-28 DIAGNOSIS — I10 Essential (primary) hypertension: Secondary | ICD-10-CM | POA: Diagnosis not present

## 2016-11-28 DIAGNOSIS — C3492 Malignant neoplasm of unspecified part of left bronchus or lung: Secondary | ICD-10-CM | POA: Diagnosis not present

## 2016-11-28 DIAGNOSIS — E039 Hypothyroidism, unspecified: Secondary | ICD-10-CM | POA: Diagnosis not present

## 2016-12-01 DIAGNOSIS — C3492 Malignant neoplasm of unspecified part of left bronchus or lung: Secondary | ICD-10-CM | POA: Diagnosis not present

## 2016-12-01 DIAGNOSIS — I10 Essential (primary) hypertension: Secondary | ICD-10-CM | POA: Diagnosis not present

## 2016-12-01 DIAGNOSIS — F0150 Vascular dementia without behavioral disturbance: Secondary | ICD-10-CM | POA: Diagnosis not present

## 2016-12-01 DIAGNOSIS — K219 Gastro-esophageal reflux disease without esophagitis: Secondary | ICD-10-CM | POA: Diagnosis not present

## 2016-12-01 DIAGNOSIS — I519 Heart disease, unspecified: Secondary | ICD-10-CM | POA: Diagnosis not present

## 2016-12-01 DIAGNOSIS — E039 Hypothyroidism, unspecified: Secondary | ICD-10-CM | POA: Diagnosis not present

## 2016-12-03 ENCOUNTER — Other Ambulatory Visit: Payer: Self-pay | Admitting: Family Medicine

## 2016-12-04 NOTE — Telephone Encounter (Signed)
Last seen 03/08/17

## 2016-12-05 DIAGNOSIS — F0150 Vascular dementia without behavioral disturbance: Secondary | ICD-10-CM | POA: Diagnosis not present

## 2016-12-05 DIAGNOSIS — C3492 Malignant neoplasm of unspecified part of left bronchus or lung: Secondary | ICD-10-CM | POA: Diagnosis not present

## 2016-12-05 DIAGNOSIS — I10 Essential (primary) hypertension: Secondary | ICD-10-CM | POA: Diagnosis not present

## 2016-12-05 DIAGNOSIS — K219 Gastro-esophageal reflux disease without esophagitis: Secondary | ICD-10-CM | POA: Diagnosis not present

## 2016-12-05 DIAGNOSIS — E039 Hypothyroidism, unspecified: Secondary | ICD-10-CM | POA: Diagnosis not present

## 2016-12-05 DIAGNOSIS — I519 Heart disease, unspecified: Secondary | ICD-10-CM | POA: Diagnosis not present

## 2016-12-06 DIAGNOSIS — K219 Gastro-esophageal reflux disease without esophagitis: Secondary | ICD-10-CM | POA: Diagnosis not present

## 2016-12-06 DIAGNOSIS — F0150 Vascular dementia without behavioral disturbance: Secondary | ICD-10-CM | POA: Diagnosis not present

## 2016-12-06 DIAGNOSIS — I10 Essential (primary) hypertension: Secondary | ICD-10-CM | POA: Diagnosis not present

## 2016-12-06 DIAGNOSIS — C3492 Malignant neoplasm of unspecified part of left bronchus or lung: Secondary | ICD-10-CM | POA: Diagnosis not present

## 2016-12-06 DIAGNOSIS — E039 Hypothyroidism, unspecified: Secondary | ICD-10-CM | POA: Diagnosis not present

## 2016-12-06 DIAGNOSIS — I519 Heart disease, unspecified: Secondary | ICD-10-CM | POA: Diagnosis not present

## 2016-12-08 DIAGNOSIS — C3492 Malignant neoplasm of unspecified part of left bronchus or lung: Secondary | ICD-10-CM | POA: Diagnosis not present

## 2016-12-08 DIAGNOSIS — I519 Heart disease, unspecified: Secondary | ICD-10-CM | POA: Diagnosis not present

## 2016-12-08 DIAGNOSIS — I10 Essential (primary) hypertension: Secondary | ICD-10-CM | POA: Diagnosis not present

## 2016-12-08 DIAGNOSIS — F0150 Vascular dementia without behavioral disturbance: Secondary | ICD-10-CM | POA: Diagnosis not present

## 2016-12-08 DIAGNOSIS — E039 Hypothyroidism, unspecified: Secondary | ICD-10-CM | POA: Diagnosis not present

## 2016-12-08 DIAGNOSIS — K219 Gastro-esophageal reflux disease without esophagitis: Secondary | ICD-10-CM | POA: Diagnosis not present

## 2016-12-15 DIAGNOSIS — C3492 Malignant neoplasm of unspecified part of left bronchus or lung: Secondary | ICD-10-CM | POA: Diagnosis not present

## 2016-12-15 DIAGNOSIS — K219 Gastro-esophageal reflux disease without esophagitis: Secondary | ICD-10-CM | POA: Diagnosis not present

## 2016-12-15 DIAGNOSIS — E039 Hypothyroidism, unspecified: Secondary | ICD-10-CM | POA: Diagnosis not present

## 2016-12-15 DIAGNOSIS — I519 Heart disease, unspecified: Secondary | ICD-10-CM | POA: Diagnosis not present

## 2016-12-15 DIAGNOSIS — I10 Essential (primary) hypertension: Secondary | ICD-10-CM | POA: Diagnosis not present

## 2016-12-15 DIAGNOSIS — F0150 Vascular dementia without behavioral disturbance: Secondary | ICD-10-CM | POA: Diagnosis not present

## 2016-12-19 DIAGNOSIS — I10 Essential (primary) hypertension: Secondary | ICD-10-CM | POA: Diagnosis not present

## 2016-12-19 DIAGNOSIS — K219 Gastro-esophageal reflux disease without esophagitis: Secondary | ICD-10-CM | POA: Diagnosis not present

## 2016-12-19 DIAGNOSIS — F0150 Vascular dementia without behavioral disturbance: Secondary | ICD-10-CM | POA: Diagnosis not present

## 2016-12-19 DIAGNOSIS — E039 Hypothyroidism, unspecified: Secondary | ICD-10-CM | POA: Diagnosis not present

## 2016-12-19 DIAGNOSIS — C3492 Malignant neoplasm of unspecified part of left bronchus or lung: Secondary | ICD-10-CM | POA: Diagnosis not present

## 2016-12-19 DIAGNOSIS — I519 Heart disease, unspecified: Secondary | ICD-10-CM | POA: Diagnosis not present

## 2016-12-20 DIAGNOSIS — E039 Hypothyroidism, unspecified: Secondary | ICD-10-CM | POA: Diagnosis not present

## 2016-12-20 DIAGNOSIS — I10 Essential (primary) hypertension: Secondary | ICD-10-CM | POA: Diagnosis not present

## 2016-12-20 DIAGNOSIS — C3492 Malignant neoplasm of unspecified part of left bronchus or lung: Secondary | ICD-10-CM | POA: Diagnosis not present

## 2016-12-20 DIAGNOSIS — F0150 Vascular dementia without behavioral disturbance: Secondary | ICD-10-CM | POA: Diagnosis not present

## 2016-12-20 DIAGNOSIS — K219 Gastro-esophageal reflux disease without esophagitis: Secondary | ICD-10-CM | POA: Diagnosis not present

## 2016-12-20 DIAGNOSIS — I519 Heart disease, unspecified: Secondary | ICD-10-CM | POA: Diagnosis not present

## 2016-12-22 DIAGNOSIS — F0150 Vascular dementia without behavioral disturbance: Secondary | ICD-10-CM | POA: Diagnosis not present

## 2016-12-22 DIAGNOSIS — C3492 Malignant neoplasm of unspecified part of left bronchus or lung: Secondary | ICD-10-CM | POA: Diagnosis not present

## 2016-12-22 DIAGNOSIS — K219 Gastro-esophageal reflux disease without esophagitis: Secondary | ICD-10-CM | POA: Diagnosis not present

## 2016-12-22 DIAGNOSIS — I519 Heart disease, unspecified: Secondary | ICD-10-CM | POA: Diagnosis not present

## 2016-12-22 DIAGNOSIS — E039 Hypothyroidism, unspecified: Secondary | ICD-10-CM | POA: Diagnosis not present

## 2016-12-22 DIAGNOSIS — I10 Essential (primary) hypertension: Secondary | ICD-10-CM | POA: Diagnosis not present

## 2016-12-24 DIAGNOSIS — E039 Hypothyroidism, unspecified: Secondary | ICD-10-CM | POA: Diagnosis not present

## 2016-12-24 DIAGNOSIS — F0150 Vascular dementia without behavioral disturbance: Secondary | ICD-10-CM | POA: Diagnosis not present

## 2016-12-24 DIAGNOSIS — R630 Anorexia: Secondary | ICD-10-CM | POA: Diagnosis not present

## 2016-12-24 DIAGNOSIS — R5383 Other fatigue: Secondary | ICD-10-CM | POA: Diagnosis not present

## 2016-12-24 DIAGNOSIS — C3492 Malignant neoplasm of unspecified part of left bronchus or lung: Secondary | ICD-10-CM | POA: Diagnosis not present

## 2016-12-24 DIAGNOSIS — K219 Gastro-esophageal reflux disease without esophagitis: Secondary | ICD-10-CM | POA: Diagnosis not present

## 2016-12-24 DIAGNOSIS — I519 Heart disease, unspecified: Secondary | ICD-10-CM | POA: Diagnosis not present

## 2016-12-24 DIAGNOSIS — I10 Essential (primary) hypertension: Secondary | ICD-10-CM | POA: Diagnosis not present

## 2016-12-24 DIAGNOSIS — R531 Weakness: Secondary | ICD-10-CM | POA: Diagnosis not present

## 2016-12-24 DIAGNOSIS — R110 Nausea: Secondary | ICD-10-CM | POA: Diagnosis not present

## 2016-12-24 DIAGNOSIS — R52 Pain, unspecified: Secondary | ICD-10-CM | POA: Diagnosis not present

## 2016-12-25 DIAGNOSIS — E039 Hypothyroidism, unspecified: Secondary | ICD-10-CM | POA: Diagnosis not present

## 2016-12-25 DIAGNOSIS — C3492 Malignant neoplasm of unspecified part of left bronchus or lung: Secondary | ICD-10-CM | POA: Diagnosis not present

## 2016-12-25 DIAGNOSIS — K219 Gastro-esophageal reflux disease without esophagitis: Secondary | ICD-10-CM | POA: Diagnosis not present

## 2016-12-25 DIAGNOSIS — F0150 Vascular dementia without behavioral disturbance: Secondary | ICD-10-CM | POA: Diagnosis not present

## 2016-12-25 DIAGNOSIS — I519 Heart disease, unspecified: Secondary | ICD-10-CM | POA: Diagnosis not present

## 2016-12-25 DIAGNOSIS — I10 Essential (primary) hypertension: Secondary | ICD-10-CM | POA: Diagnosis not present

## 2016-12-29 DIAGNOSIS — E039 Hypothyroidism, unspecified: Secondary | ICD-10-CM | POA: Diagnosis not present

## 2016-12-29 DIAGNOSIS — K219 Gastro-esophageal reflux disease without esophagitis: Secondary | ICD-10-CM | POA: Diagnosis not present

## 2016-12-29 DIAGNOSIS — I519 Heart disease, unspecified: Secondary | ICD-10-CM | POA: Diagnosis not present

## 2016-12-29 DIAGNOSIS — C3492 Malignant neoplasm of unspecified part of left bronchus or lung: Secondary | ICD-10-CM | POA: Diagnosis not present

## 2016-12-29 DIAGNOSIS — I10 Essential (primary) hypertension: Secondary | ICD-10-CM | POA: Diagnosis not present

## 2016-12-29 DIAGNOSIS — F0150 Vascular dementia without behavioral disturbance: Secondary | ICD-10-CM | POA: Diagnosis not present

## 2017-01-02 DIAGNOSIS — I519 Heart disease, unspecified: Secondary | ICD-10-CM | POA: Diagnosis not present

## 2017-01-02 DIAGNOSIS — F0150 Vascular dementia without behavioral disturbance: Secondary | ICD-10-CM | POA: Diagnosis not present

## 2017-01-02 DIAGNOSIS — K219 Gastro-esophageal reflux disease without esophagitis: Secondary | ICD-10-CM | POA: Diagnosis not present

## 2017-01-02 DIAGNOSIS — C3492 Malignant neoplasm of unspecified part of left bronchus or lung: Secondary | ICD-10-CM | POA: Diagnosis not present

## 2017-01-02 DIAGNOSIS — E039 Hypothyroidism, unspecified: Secondary | ICD-10-CM | POA: Diagnosis not present

## 2017-01-02 DIAGNOSIS — I10 Essential (primary) hypertension: Secondary | ICD-10-CM | POA: Diagnosis not present

## 2017-01-03 DIAGNOSIS — K219 Gastro-esophageal reflux disease without esophagitis: Secondary | ICD-10-CM | POA: Diagnosis not present

## 2017-01-03 DIAGNOSIS — I10 Essential (primary) hypertension: Secondary | ICD-10-CM | POA: Diagnosis not present

## 2017-01-03 DIAGNOSIS — I519 Heart disease, unspecified: Secondary | ICD-10-CM | POA: Diagnosis not present

## 2017-01-03 DIAGNOSIS — C3492 Malignant neoplasm of unspecified part of left bronchus or lung: Secondary | ICD-10-CM | POA: Diagnosis not present

## 2017-01-03 DIAGNOSIS — E039 Hypothyroidism, unspecified: Secondary | ICD-10-CM | POA: Diagnosis not present

## 2017-01-03 DIAGNOSIS — F0150 Vascular dementia without behavioral disturbance: Secondary | ICD-10-CM | POA: Diagnosis not present

## 2017-01-05 DIAGNOSIS — I519 Heart disease, unspecified: Secondary | ICD-10-CM | POA: Diagnosis not present

## 2017-01-05 DIAGNOSIS — E039 Hypothyroidism, unspecified: Secondary | ICD-10-CM | POA: Diagnosis not present

## 2017-01-05 DIAGNOSIS — C3492 Malignant neoplasm of unspecified part of left bronchus or lung: Secondary | ICD-10-CM | POA: Diagnosis not present

## 2017-01-05 DIAGNOSIS — F0150 Vascular dementia without behavioral disturbance: Secondary | ICD-10-CM | POA: Diagnosis not present

## 2017-01-05 DIAGNOSIS — K219 Gastro-esophageal reflux disease without esophagitis: Secondary | ICD-10-CM | POA: Diagnosis not present

## 2017-01-05 DIAGNOSIS — I10 Essential (primary) hypertension: Secondary | ICD-10-CM | POA: Diagnosis not present

## 2017-01-07 ENCOUNTER — Other Ambulatory Visit: Payer: Self-pay | Admitting: Family Medicine

## 2017-01-10 DIAGNOSIS — I519 Heart disease, unspecified: Secondary | ICD-10-CM | POA: Diagnosis not present

## 2017-01-10 DIAGNOSIS — C3492 Malignant neoplasm of unspecified part of left bronchus or lung: Secondary | ICD-10-CM | POA: Diagnosis not present

## 2017-01-10 DIAGNOSIS — K219 Gastro-esophageal reflux disease without esophagitis: Secondary | ICD-10-CM | POA: Diagnosis not present

## 2017-01-10 DIAGNOSIS — F0150 Vascular dementia without behavioral disturbance: Secondary | ICD-10-CM | POA: Diagnosis not present

## 2017-01-10 DIAGNOSIS — E039 Hypothyroidism, unspecified: Secondary | ICD-10-CM | POA: Diagnosis not present

## 2017-01-10 DIAGNOSIS — I10 Essential (primary) hypertension: Secondary | ICD-10-CM | POA: Diagnosis not present

## 2017-01-11 DIAGNOSIS — C3492 Malignant neoplasm of unspecified part of left bronchus or lung: Secondary | ICD-10-CM | POA: Diagnosis not present

## 2017-01-11 DIAGNOSIS — E039 Hypothyroidism, unspecified: Secondary | ICD-10-CM | POA: Diagnosis not present

## 2017-01-11 DIAGNOSIS — F0150 Vascular dementia without behavioral disturbance: Secondary | ICD-10-CM | POA: Diagnosis not present

## 2017-01-11 DIAGNOSIS — I10 Essential (primary) hypertension: Secondary | ICD-10-CM | POA: Diagnosis not present

## 2017-01-11 DIAGNOSIS — K219 Gastro-esophageal reflux disease without esophagitis: Secondary | ICD-10-CM | POA: Diagnosis not present

## 2017-01-11 DIAGNOSIS — I519 Heart disease, unspecified: Secondary | ICD-10-CM | POA: Diagnosis not present

## 2017-01-12 DIAGNOSIS — F0150 Vascular dementia without behavioral disturbance: Secondary | ICD-10-CM | POA: Diagnosis not present

## 2017-01-12 DIAGNOSIS — I519 Heart disease, unspecified: Secondary | ICD-10-CM | POA: Diagnosis not present

## 2017-01-12 DIAGNOSIS — K219 Gastro-esophageal reflux disease without esophagitis: Secondary | ICD-10-CM | POA: Diagnosis not present

## 2017-01-12 DIAGNOSIS — E039 Hypothyroidism, unspecified: Secondary | ICD-10-CM | POA: Diagnosis not present

## 2017-01-12 DIAGNOSIS — C3492 Malignant neoplasm of unspecified part of left bronchus or lung: Secondary | ICD-10-CM | POA: Diagnosis not present

## 2017-01-12 DIAGNOSIS — I10 Essential (primary) hypertension: Secondary | ICD-10-CM | POA: Diagnosis not present

## 2017-01-14 DIAGNOSIS — F0150 Vascular dementia without behavioral disturbance: Secondary | ICD-10-CM | POA: Diagnosis not present

## 2017-01-14 DIAGNOSIS — E039 Hypothyroidism, unspecified: Secondary | ICD-10-CM | POA: Diagnosis not present

## 2017-01-14 DIAGNOSIS — C3492 Malignant neoplasm of unspecified part of left bronchus or lung: Secondary | ICD-10-CM | POA: Diagnosis not present

## 2017-01-14 DIAGNOSIS — I10 Essential (primary) hypertension: Secondary | ICD-10-CM | POA: Diagnosis not present

## 2017-01-14 DIAGNOSIS — K219 Gastro-esophageal reflux disease without esophagitis: Secondary | ICD-10-CM | POA: Diagnosis not present

## 2017-01-14 DIAGNOSIS — I519 Heart disease, unspecified: Secondary | ICD-10-CM | POA: Diagnosis not present

## 2017-01-15 DIAGNOSIS — E039 Hypothyroidism, unspecified: Secondary | ICD-10-CM | POA: Diagnosis not present

## 2017-01-15 DIAGNOSIS — I519 Heart disease, unspecified: Secondary | ICD-10-CM | POA: Diagnosis not present

## 2017-01-15 DIAGNOSIS — K219 Gastro-esophageal reflux disease without esophagitis: Secondary | ICD-10-CM | POA: Diagnosis not present

## 2017-01-15 DIAGNOSIS — C3492 Malignant neoplasm of unspecified part of left bronchus or lung: Secondary | ICD-10-CM | POA: Diagnosis not present

## 2017-01-15 DIAGNOSIS — I10 Essential (primary) hypertension: Secondary | ICD-10-CM | POA: Diagnosis not present

## 2017-01-15 DIAGNOSIS — F0150 Vascular dementia without behavioral disturbance: Secondary | ICD-10-CM | POA: Diagnosis not present

## 2017-01-16 ENCOUNTER — Other Ambulatory Visit: Payer: Self-pay | Admitting: Neurology

## 2017-01-16 DIAGNOSIS — F0390 Unspecified dementia without behavioral disturbance: Secondary | ICD-10-CM

## 2017-01-16 DIAGNOSIS — F03B0 Unspecified dementia, moderate, without behavioral disturbance, psychotic disturbance, mood disturbance, and anxiety: Secondary | ICD-10-CM

## 2017-01-17 DIAGNOSIS — K219 Gastro-esophageal reflux disease without esophagitis: Secondary | ICD-10-CM | POA: Diagnosis not present

## 2017-01-17 DIAGNOSIS — F0150 Vascular dementia without behavioral disturbance: Secondary | ICD-10-CM | POA: Diagnosis not present

## 2017-01-17 DIAGNOSIS — C3492 Malignant neoplasm of unspecified part of left bronchus or lung: Secondary | ICD-10-CM | POA: Diagnosis not present

## 2017-01-17 DIAGNOSIS — E039 Hypothyroidism, unspecified: Secondary | ICD-10-CM | POA: Diagnosis not present

## 2017-01-17 DIAGNOSIS — I519 Heart disease, unspecified: Secondary | ICD-10-CM | POA: Diagnosis not present

## 2017-01-17 DIAGNOSIS — I10 Essential (primary) hypertension: Secondary | ICD-10-CM | POA: Diagnosis not present

## 2017-01-18 DIAGNOSIS — I10 Essential (primary) hypertension: Secondary | ICD-10-CM | POA: Diagnosis not present

## 2017-01-18 DIAGNOSIS — F0150 Vascular dementia without behavioral disturbance: Secondary | ICD-10-CM | POA: Diagnosis not present

## 2017-01-18 DIAGNOSIS — K219 Gastro-esophageal reflux disease without esophagitis: Secondary | ICD-10-CM | POA: Diagnosis not present

## 2017-01-18 DIAGNOSIS — C3492 Malignant neoplasm of unspecified part of left bronchus or lung: Secondary | ICD-10-CM | POA: Diagnosis not present

## 2017-01-18 DIAGNOSIS — I519 Heart disease, unspecified: Secondary | ICD-10-CM | POA: Diagnosis not present

## 2017-01-18 DIAGNOSIS — E039 Hypothyroidism, unspecified: Secondary | ICD-10-CM | POA: Diagnosis not present

## 2017-01-19 DIAGNOSIS — C3492 Malignant neoplasm of unspecified part of left bronchus or lung: Secondary | ICD-10-CM | POA: Diagnosis not present

## 2017-01-19 DIAGNOSIS — I10 Essential (primary) hypertension: Secondary | ICD-10-CM | POA: Diagnosis not present

## 2017-01-19 DIAGNOSIS — K219 Gastro-esophageal reflux disease without esophagitis: Secondary | ICD-10-CM | POA: Diagnosis not present

## 2017-01-19 DIAGNOSIS — I519 Heart disease, unspecified: Secondary | ICD-10-CM | POA: Diagnosis not present

## 2017-01-19 DIAGNOSIS — E039 Hypothyroidism, unspecified: Secondary | ICD-10-CM | POA: Diagnosis not present

## 2017-01-19 DIAGNOSIS — F0150 Vascular dementia without behavioral disturbance: Secondary | ICD-10-CM | POA: Diagnosis not present

## 2017-01-20 DIAGNOSIS — I10 Essential (primary) hypertension: Secondary | ICD-10-CM | POA: Diagnosis not present

## 2017-01-20 DIAGNOSIS — F0150 Vascular dementia without behavioral disturbance: Secondary | ICD-10-CM | POA: Diagnosis not present

## 2017-01-20 DIAGNOSIS — E039 Hypothyroidism, unspecified: Secondary | ICD-10-CM | POA: Diagnosis not present

## 2017-01-20 DIAGNOSIS — I519 Heart disease, unspecified: Secondary | ICD-10-CM | POA: Diagnosis not present

## 2017-01-20 DIAGNOSIS — K219 Gastro-esophageal reflux disease without esophagitis: Secondary | ICD-10-CM | POA: Diagnosis not present

## 2017-01-20 DIAGNOSIS — C3492 Malignant neoplasm of unspecified part of left bronchus or lung: Secondary | ICD-10-CM | POA: Diagnosis not present

## 2017-01-21 DIAGNOSIS — E039 Hypothyroidism, unspecified: Secondary | ICD-10-CM | POA: Diagnosis not present

## 2017-01-21 DIAGNOSIS — I519 Heart disease, unspecified: Secondary | ICD-10-CM | POA: Diagnosis not present

## 2017-01-21 DIAGNOSIS — I10 Essential (primary) hypertension: Secondary | ICD-10-CM | POA: Diagnosis not present

## 2017-01-21 DIAGNOSIS — F0150 Vascular dementia without behavioral disturbance: Secondary | ICD-10-CM | POA: Diagnosis not present

## 2017-01-21 DIAGNOSIS — C3492 Malignant neoplasm of unspecified part of left bronchus or lung: Secondary | ICD-10-CM | POA: Diagnosis not present

## 2017-01-21 DIAGNOSIS — K219 Gastro-esophageal reflux disease without esophagitis: Secondary | ICD-10-CM | POA: Diagnosis not present

## 2017-01-22 DIAGNOSIS — K219 Gastro-esophageal reflux disease without esophagitis: Secondary | ICD-10-CM | POA: Diagnosis not present

## 2017-01-22 DIAGNOSIS — I519 Heart disease, unspecified: Secondary | ICD-10-CM | POA: Diagnosis not present

## 2017-01-22 DIAGNOSIS — F0150 Vascular dementia without behavioral disturbance: Secondary | ICD-10-CM | POA: Diagnosis not present

## 2017-01-22 DIAGNOSIS — I10 Essential (primary) hypertension: Secondary | ICD-10-CM | POA: Diagnosis not present

## 2017-01-22 DIAGNOSIS — E039 Hypothyroidism, unspecified: Secondary | ICD-10-CM | POA: Diagnosis not present

## 2017-01-22 DIAGNOSIS — C3492 Malignant neoplasm of unspecified part of left bronchus or lung: Secondary | ICD-10-CM | POA: Diagnosis not present

## 2017-01-23 DIAGNOSIS — F0150 Vascular dementia without behavioral disturbance: Secondary | ICD-10-CM | POA: Diagnosis not present

## 2017-01-23 DIAGNOSIS — I10 Essential (primary) hypertension: Secondary | ICD-10-CM | POA: Diagnosis not present

## 2017-01-23 DIAGNOSIS — C3492 Malignant neoplasm of unspecified part of left bronchus or lung: Secondary | ICD-10-CM | POA: Diagnosis not present

## 2017-01-23 DIAGNOSIS — E039 Hypothyroidism, unspecified: Secondary | ICD-10-CM | POA: Diagnosis not present

## 2017-01-23 DIAGNOSIS — I519 Heart disease, unspecified: Secondary | ICD-10-CM | POA: Diagnosis not present

## 2017-01-23 DIAGNOSIS — K219 Gastro-esophageal reflux disease without esophagitis: Secondary | ICD-10-CM | POA: Diagnosis not present

## 2017-01-24 DEATH — deceased

## 2017-01-31 ENCOUNTER — Other Ambulatory Visit: Payer: Self-pay | Admitting: Family Medicine

## 2017-02-27 ENCOUNTER — Other Ambulatory Visit (HOSPITAL_COMMUNITY): Payer: Medicare Other

## 2017-04-11 IMAGING — MR MR HEAD WO/W CM
7 of 13 series · 21 of 48 positions shown · IV contrast (multihance)
Comparison: CT head without contrast 07/16/2014. MRI brain without
and with contrast 08/27/2013.

CLINICAL DATA: Altered mental status. Confusion. Lung cancer. Half
dose contrast was given due to renal impairment.

EXAM:
MRI HEAD WITHOUT AND WITH CONTRAST
TECHNIQUE: Multiplanar, multiecho pulse sequences of the brain and surrounding
structures were obtained without and with intravenous contrast.
CONTRAST:  8mL MULTIHANCE GADOBENATE DIMEGLUMINE 529 MG/ML IV SOLN

[Series 3: T1 · sagittal · 5.0mm · 0.41mm/px · 2 of 21 slices shown]
[im 1/21]
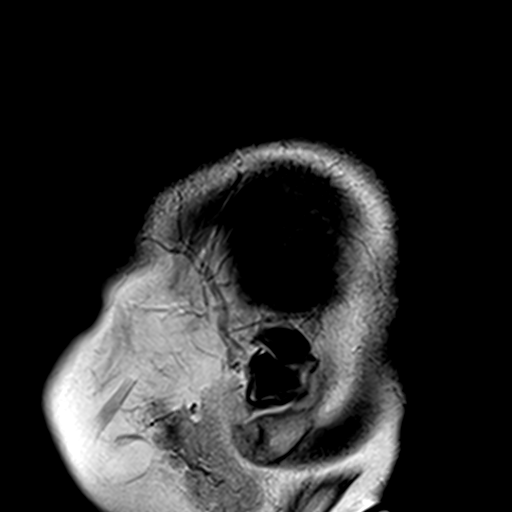
[im 21/21]
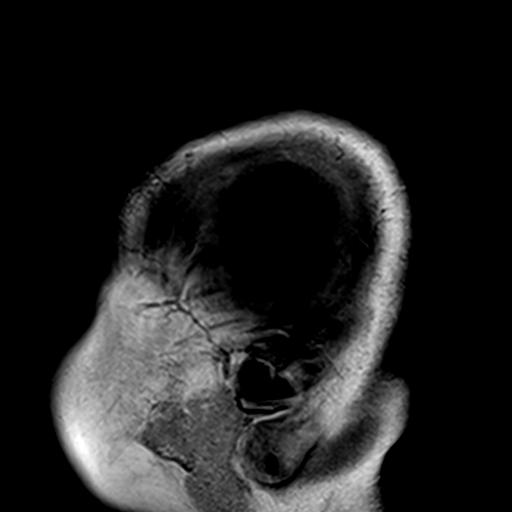

[Series 6: T2 · axial · 5.0mm · 0.49mm/px · z∈[-40,+103]mm · 2 of 23 slices shown (1 of 2)]
[im 1/23]
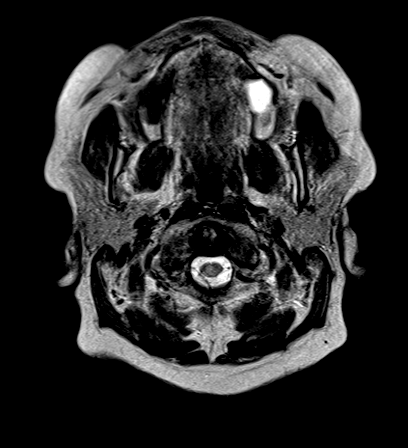
[im 23/23]
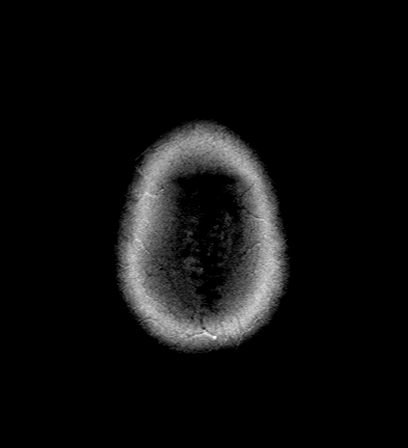

[Series 7: FLAIR · axial · 5.0mm · 0.34mm/px · z∈[-40,+103]mm · 2 of 23 slices shown]
[im 1/23]
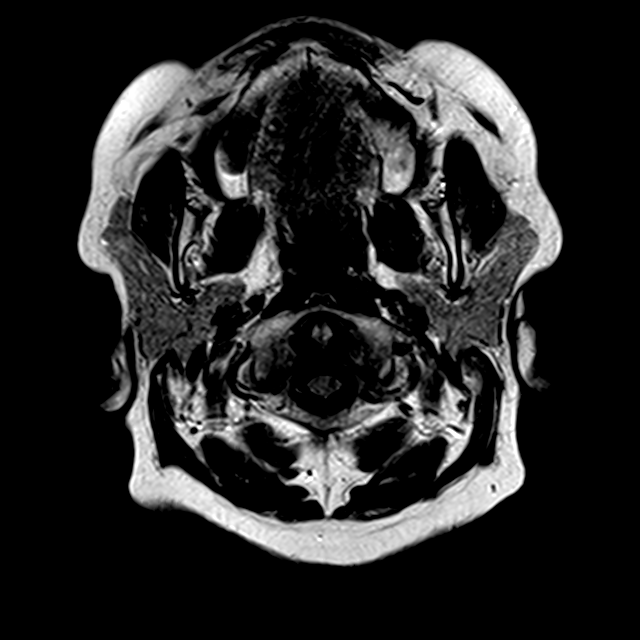
[im 23/23]
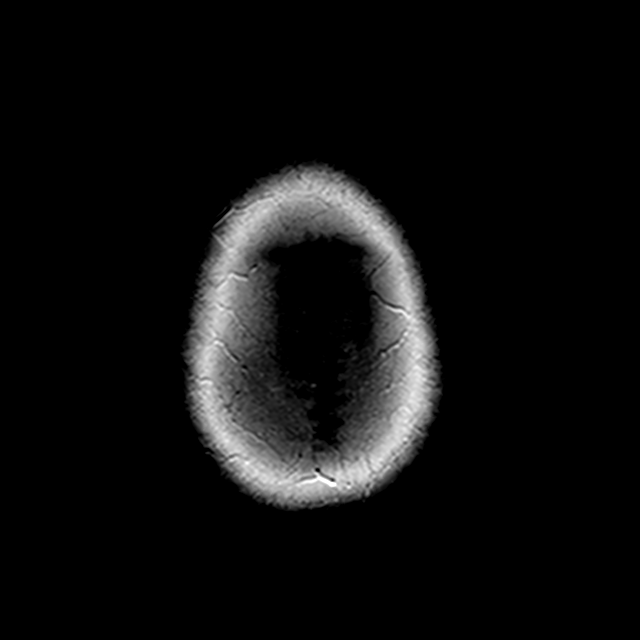

[Series 10: T2 · coronal · 5.0mm · 0.41mm/px · 3 of 28 slices shown (2 of 2)]
[im 1/28]
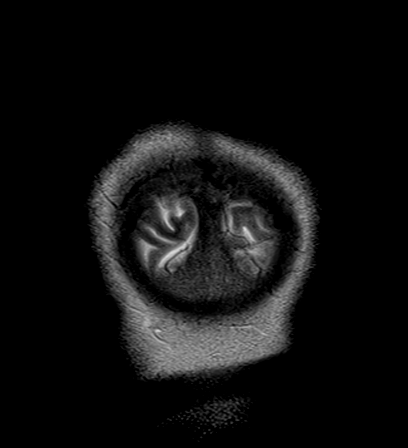
[im 14/28]
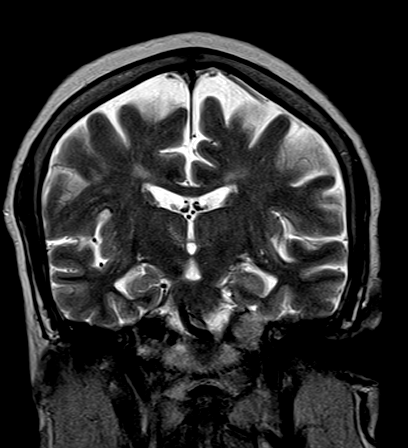
[im 28/28]
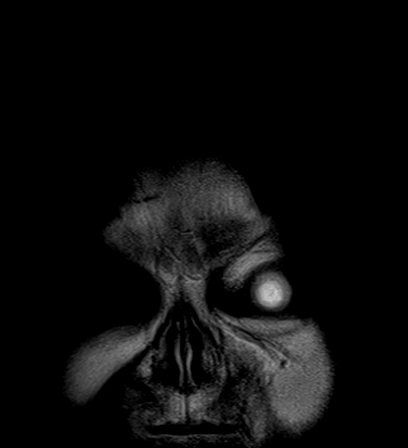

[Series 11: T1 post-contrast · axial · 2.0mm · 0.42mm/px · z∈[-42,+104]mm · 7 of 74 slices shown (1 of 3)]
[im 1/74]
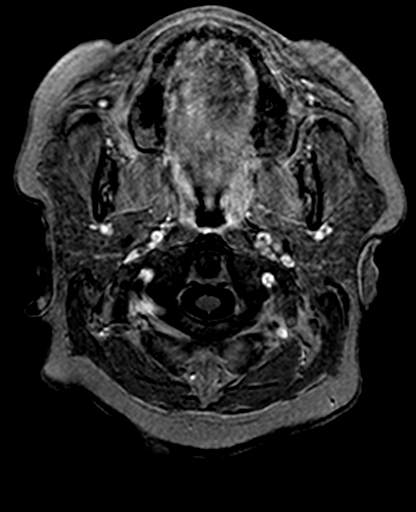
[im 13/74]
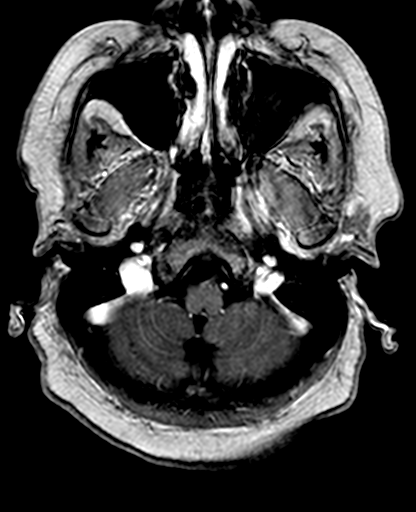
[im 25/74]
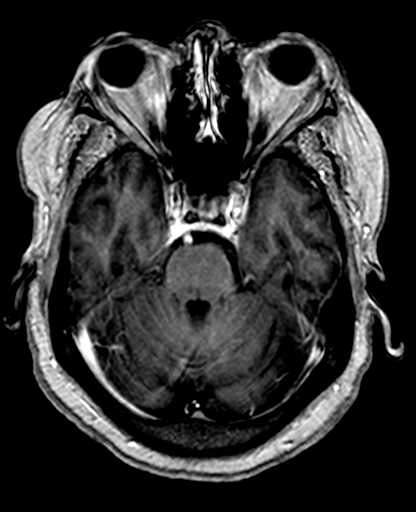
[im 37/74]
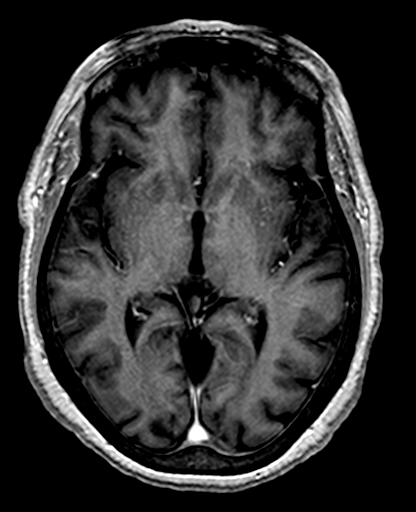
[im 49/74]
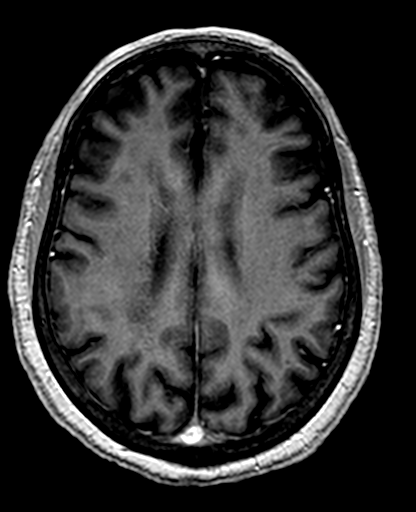
[im 61/74]
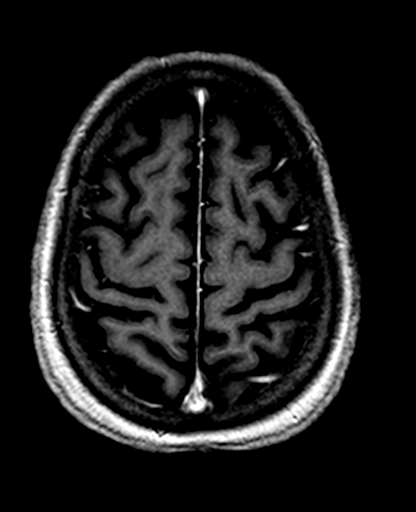
[im 74/74]
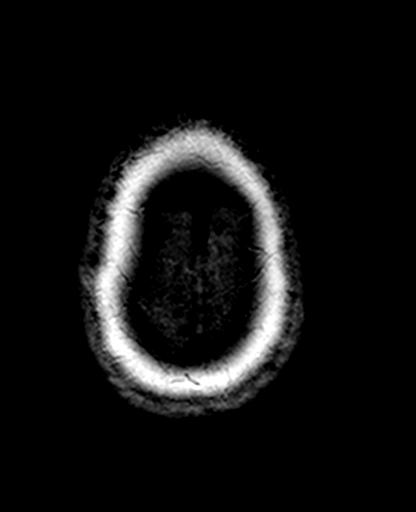

[Series 12: T1 post-contrast · coronal · 5.0mm · 0.37mm/px · 3 of 28 slices shown (2 of 3)]
[im 1/28]
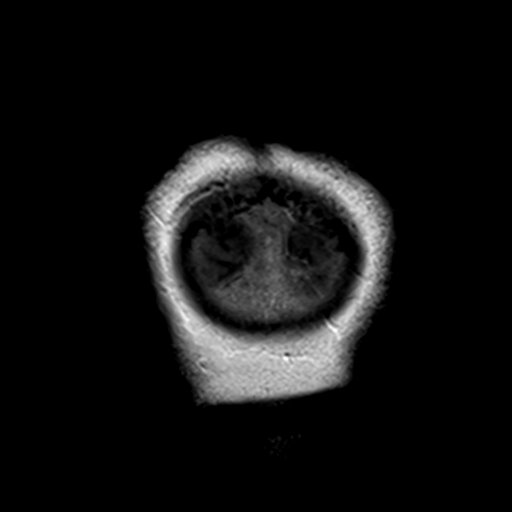
[im 14/28]
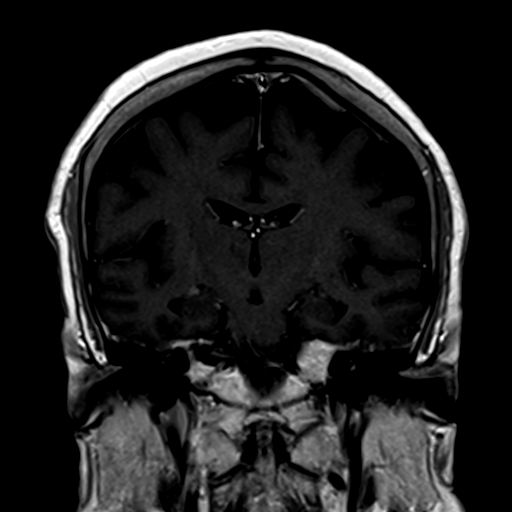
[im 28/28]
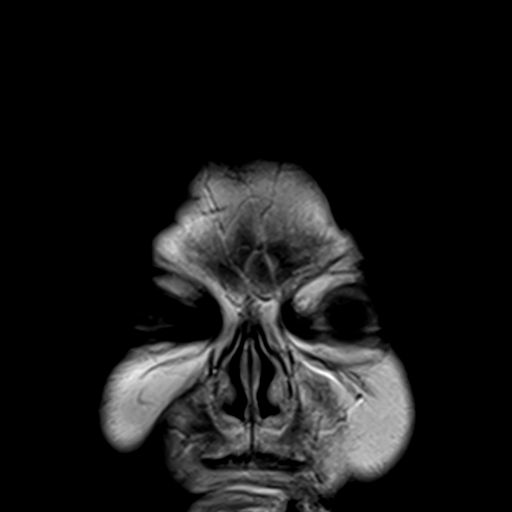

[Series 13: T1 post-contrast · sagittal · 5.0mm · 0.40mm/px · 2 of 21 slices shown (3 of 3)]
[im 1/21]
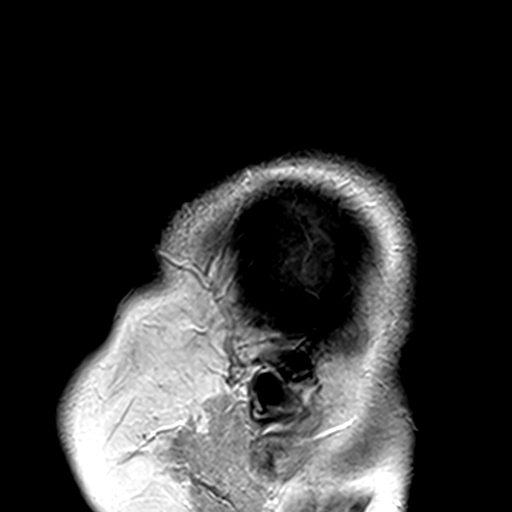
[im 21/21]
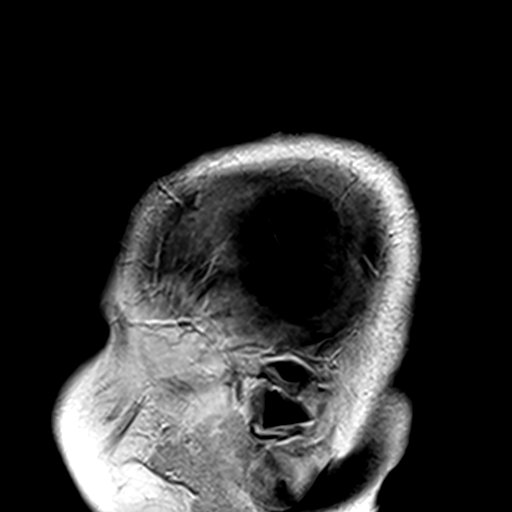

[21 of 48 positions shown; findings below may reference images not displayed]

FINDINGS: Atrophy and moderate white matter disease is similar to the prior
exam. No acute infarct, hemorrhage, or mass lesion is present. The
postcontrast scratch the

Postcontrast images demonstrate no pathologic enhancement to suggest
metastatic disease of the brain or meninges.

The ventricles are proportionate to the degree of atrophy and
unchanged. Insert pass fluid

Flow is present in the major intracranial arteries. Bilateral lens
replacements are noted. The globes and orbits are otherwise intact.
A polyp or mucous retention cyst along the floor of the left
maxillary sinus is stable. The remaining paranasal sinuses and the
mastoid air cells are clear. The skullbase is within normal limits.
Midline structures are normal.
IMPRESSION: 1. No evidence for metastatic disease the brain or meninges.
2. Stable atrophy and moderate white matter disease. This likely
reflects the sequela of chronic microvascular ischemia.
3. Stable nonenhancing polyp or mucous retention cyst along the
floor of the left maxillary sinus.
